# Patient Record
Sex: Male | Born: 1970
Health system: Southern US, Community
[De-identification: ages and names within clinical notes are randomized; demographics above are authoritative.]

## PROBLEM LIST (undated history)

## (undated) DIAGNOSIS — I513 Intracardiac thrombosis, not elsewhere classified: Secondary | ICD-10-CM

## (undated) DIAGNOSIS — Z9989 Dependence on other enabling machines and devices: Secondary | ICD-10-CM

## (undated) DIAGNOSIS — I4891 Unspecified atrial fibrillation: Secondary | ICD-10-CM

## (undated) DIAGNOSIS — I4892 Unspecified atrial flutter: Secondary | ICD-10-CM

## (undated) DIAGNOSIS — G4733 Obstructive sleep apnea (adult) (pediatric): Secondary | ICD-10-CM

## (undated) DIAGNOSIS — G4730 Sleep apnea, unspecified: Secondary | ICD-10-CM

## (undated) DIAGNOSIS — D689 Coagulation defect, unspecified: Secondary | ICD-10-CM

## (undated) DIAGNOSIS — F17200 Nicotine dependence, unspecified, uncomplicated: Secondary | ICD-10-CM

## (undated) DIAGNOSIS — J349 Unspecified disorder of nose and nasal sinuses: Secondary | ICD-10-CM

## (undated) DIAGNOSIS — I5020 Unspecified systolic (congestive) heart failure: Secondary | ICD-10-CM

## (undated) DIAGNOSIS — IMO0002 Reserved for concepts with insufficient information to code with codable children: Secondary | ICD-10-CM

## (undated) DIAGNOSIS — K529 Noninfective gastroenteritis and colitis, unspecified: Secondary | ICD-10-CM

## (undated) DIAGNOSIS — F419 Anxiety disorder, unspecified: Secondary | ICD-10-CM

## (undated) DIAGNOSIS — K5792 Diverticulitis of intestine, part unspecified, without perforation or abscess without bleeding: Secondary | ICD-10-CM

## (undated) DIAGNOSIS — I428 Other cardiomyopathies: Secondary | ICD-10-CM

## (undated) DIAGNOSIS — N189 Chronic kidney disease, unspecified: Secondary | ICD-10-CM

## (undated) DIAGNOSIS — E0590 Thyrotoxicosis, unspecified without thyrotoxic crisis or storm: Secondary | ICD-10-CM

## (undated) HISTORY — DX: Noninfective gastroenteritis and colitis, unspecified: K52.9

## (undated) HISTORY — PX: ATRIAL ABLATION SURGERY: SHX560

## (undated) HISTORY — DX: Anxiety disorder, unspecified: F41.9

## (undated) HISTORY — DX: Sleep apnea, unspecified: G47.30

## (undated) HISTORY — DX: Obstructive sleep apnea (adult) (pediatric): G47.33

## (undated) HISTORY — DX: Diverticulitis of intestine, part unspecified, without perforation or abscess without bleeding: K57.92

## (undated) HISTORY — PX: FINGER SURGERY: SHX640

## (undated) HISTORY — DX: Other cardiomyopathies: I42.8

## (undated) HISTORY — DX: Unspecified atrial fibrillation: I48.91

## (undated) HISTORY — DX: Reserved for concepts with insufficient information to code with codable children: IMO0002

## (undated) HISTORY — DX: Unspecified systolic (congestive) heart failure: I50.20

## (undated) HISTORY — PX: CHOLECYSTECTOMY: SHX55

## (undated) HISTORY — PX: VASECTOMY: SHX75

## (undated) HISTORY — DX: Intracardiac thrombosis, not elsewhere classified: I51.3

## (undated) HISTORY — DX: Thyrotoxicosis, unspecified without thyrotoxic crisis or storm: E05.90

## (undated) HISTORY — DX: Unspecified disorder of nose and nasal sinuses: J34.9

## (undated) HISTORY — DX: Obstructive sleep apnea (adult) (pediatric): Z99.89

## (undated) HISTORY — DX: Coagulation defect, unspecified: D68.9

## (undated) HISTORY — DX: Chronic kidney disease, unspecified: N18.9

## (undated) HISTORY — DX: Nicotine dependence, unspecified, uncomplicated: F17.200

---

## 2002-10-20 ENCOUNTER — Encounter (HOSPITAL_COMMUNITY): Admission: RE | Admit: 2002-10-20 | Discharge: 2002-11-19 | Payer: Self-pay | Admitting: Preventative Medicine

## 2005-01-06 ENCOUNTER — Ambulatory Visit: Payer: Self-pay | Admitting: *Deleted

## 2009-12-29 ENCOUNTER — Ambulatory Visit: Payer: Self-pay | Admitting: Cardiology

## 2011-06-10 DIAGNOSIS — I513 Intracardiac thrombosis, not elsewhere classified: Secondary | ICD-10-CM | POA: Insufficient documentation

## 2011-07-03 ENCOUNTER — Other Ambulatory Visit: Payer: Self-pay | Admitting: Family Medicine

## 2011-07-04 DIAGNOSIS — I4891 Unspecified atrial fibrillation: Secondary | ICD-10-CM

## 2011-07-05 ENCOUNTER — Encounter: Payer: Self-pay | Admitting: Cardiology

## 2011-07-05 NOTE — Progress Notes (Signed)
   I spoke with the patient and his wife at length today in the hospital before he went home. I stressed the importance of him trying to not overwork. I stressed the importance of the meds to control his heart rate. I also recommended to him that he abstain completely from any alcohol. I explained that we hope that with no alcohol and atrial fib rate control,  he will have return of good left ventricular function. I stressed the importance also of his anticoagulation. I told him our office would call to arrange for a visit very soon to assess his heart rate. Lynden Ang will review possible times for a visit with Dr. Andee Lineman Tomorrow morning.  When walking in the ICU this morning his heart rate still increased to 120 and 130. This was while receiving diltiazem and metoprolol. After further discussion with the patient and his wife I decided to send him home on amiodarone to be used on a short-term for rate control. He was started on 200 mg twice a day of amiodarone. In addition I started digoxin at 0.25 mg daily. It was my feeling that he would need all of these meds for reasonable rate control. His digoxin level will have to be followed while on amiodarone. Dr. Andee Lineman will decide if he wants to adjust these medicines further. I also called Dr. Reuel Boom to be sure that he was aware of all the plans.  Jerral Bonito, MD

## 2011-07-06 DIAGNOSIS — I059 Rheumatic mitral valve disease, unspecified: Secondary | ICD-10-CM

## 2011-07-25 ENCOUNTER — Telehealth: Payer: Self-pay | Admitting: Cardiology

## 2011-07-25 DIAGNOSIS — Z01810 Encounter for preprocedural cardiovascular examination: Secondary | ICD-10-CM

## 2011-07-25 DIAGNOSIS — Z79899 Other long term (current) drug therapy: Secondary | ICD-10-CM

## 2011-07-25 DIAGNOSIS — I4891 Unspecified atrial fibrillation: Secondary | ICD-10-CM

## 2011-07-25 NOTE — Telephone Encounter (Signed)
Received a telephone call from Mikle Bosworth. She is wanting to know if a TEE has been scheduled yet For her husband. She also states that he is running out of Lopressor. This needs to be called into [facility] [place]. [place].  Please call on her cell # [phone].

## 2011-07-30 ENCOUNTER — Encounter: Payer: Self-pay | Admitting: Cardiology

## 2011-07-30 DIAGNOSIS — I498 Other specified cardiac arrhythmias: Secondary | ICD-10-CM

## 2011-07-31 DIAGNOSIS — I4891 Unspecified atrial fibrillation: Secondary | ICD-10-CM

## 2011-08-02 ENCOUNTER — Encounter: Payer: Self-pay | Admitting: *Deleted

## 2011-08-02 ENCOUNTER — Other Ambulatory Visit: Payer: Self-pay | Admitting: *Deleted

## 2011-08-02 ENCOUNTER — Telehealth: Payer: Self-pay

## 2011-08-02 NOTE — Telephone Encounter (Signed)
Discussed below with wife.  TEE/DCCV scheduled for Thursday, June 6 at 11:00 am.  Patient will need to arrive at [facility] at 9:00 am for same day work.  Wife will pick up info sheet & folder today.

## 2011-08-02 NOTE — Telephone Encounter (Signed)
No precert required per Doyce Para @ [facility] [phone]

## 2011-08-02 NOTE — Telephone Encounter (Signed)
TEE/DCCV scheduled for tomorrow, June 6 at 11:00.  CHECKING PERCERT

## 2011-08-02 NOTE — Telephone Encounter (Signed)
Addended by: Murriel Hopper on: 08/02/2011 10:42 AM   Modules accepted: Orders

## 2011-08-03 DIAGNOSIS — I4891 Unspecified atrial fibrillation: Secondary | ICD-10-CM

## 2011-08-04 ENCOUNTER — Ambulatory Visit (INDEPENDENT_AMBULATORY_CARE_PROVIDER_SITE_OTHER): Payer: PRIVATE HEALTH INSURANCE | Admitting: *Deleted

## 2011-08-04 VITALS — BP 117/81 | HR 56 | Ht 72.0 in | Wt 208.0 lb

## 2011-08-04 DIAGNOSIS — Z8249 Family history of ischemic heart disease and other diseases of the circulatory system: Secondary | ICD-10-CM

## 2011-08-04 DIAGNOSIS — I4892 Unspecified atrial flutter: Secondary | ICD-10-CM

## 2011-08-04 DIAGNOSIS — R000 Tachycardia, unspecified: Secondary | ICD-10-CM

## 2011-08-04 DIAGNOSIS — G4733 Obstructive sleep apnea (adult) (pediatric): Secondary | ICD-10-CM

## 2011-08-04 DIAGNOSIS — I4891 Unspecified atrial fibrillation: Secondary | ICD-10-CM

## 2011-08-04 DIAGNOSIS — I43 Cardiomyopathy in diseases classified elsewhere: Secondary | ICD-10-CM

## 2011-08-04 NOTE — Progress Notes (Signed)
Patient presents to office for ekg and vitals post cardioversion. Patient has taken medications and no side effects noted. No c/o dizziness, chest pain or sob.

## 2011-08-07 NOTE — Telephone Encounter (Signed)
Taken care off - you should have gotten Radiation protection practitioner.

## 2011-08-17 ENCOUNTER — Telehealth: Payer: Self-pay | Admitting: Cardiology

## 2011-08-17 ENCOUNTER — Encounter: Payer: Self-pay | Admitting: *Deleted

## 2011-08-17 ENCOUNTER — Ambulatory Visit (INDEPENDENT_AMBULATORY_CARE_PROVIDER_SITE_OTHER): Payer: PRIVATE HEALTH INSURANCE | Admitting: *Deleted

## 2011-08-17 VITALS — BP 119/81 | HR 126 | Ht 72.0 in | Wt 206.0 lb

## 2011-08-17 DIAGNOSIS — I4891 Unspecified atrial fibrillation: Secondary | ICD-10-CM

## 2011-08-17 DIAGNOSIS — I4892 Unspecified atrial flutter: Secondary | ICD-10-CM

## 2011-08-17 MED ORDER — AMIODARONE HCL 200 MG PO TABS: 200.0000 mg | ORAL_TABLET | Freq: Two times a day (BID) | ORAL | Status: DC

## 2011-08-17 NOTE — Patient Instructions (Addendum)
Please come to our office on Monday morning 08/21/11 @8 :30am to check rhythm status b/4 planned DCCV.  Your physician has recommended that you have a Cardioversion (DCCV). Electrical Cardioversion uses a jolt of electricity to your heart either through paddles or wired patches attached to your chest. This is a controlled, usually prescheduled, procedure. Defibrillation is done under light anesthesia in the hospital, and you usually go home the day of the procedure. This is done to get your heart back into a normal rhythm. You are not awake for the procedure. Please see the instruction sheet given to you today. Scheduled for Monday June 24th 2013 at 2:15pm. Arrival time is 12:15 pm at [facility].  Your physician has recommended you make the following change in your medication: Increase amiodarone 200 mg to twice daily.  Referral to EP Clinic at [facility].

## 2011-08-17 NOTE — Telephone Encounter (Signed)
Cardioversion with Degent on Monday at 1:00pm AT [facility]

## 2011-08-18 ENCOUNTER — Telehealth: Payer: Self-pay | Admitting: *Deleted

## 2011-08-18 NOTE — Telephone Encounter (Signed)
Pt has [facility].  Per Rowan Blase, [phone], for both repeat TEE and cardioversion, [phone], Valid Only 08-21-11

## 2011-08-18 NOTE — Progress Notes (Signed)
Patient presented to office on yesterday per email dictation from [facility] requesting vitals and EKG.

## 2011-08-18 NOTE — Telephone Encounter (Signed)
Referral to EP Clinic at [facility]. Per Dr. Andee Lineman requesting that Jerry Richards be seen as soon as possible.

## 2011-08-21 ENCOUNTER — Ambulatory Visit (INDEPENDENT_AMBULATORY_CARE_PROVIDER_SITE_OTHER): Payer: PRIVATE HEALTH INSURANCE | Admitting: *Deleted

## 2011-08-21 ENCOUNTER — Encounter: Payer: Self-pay | Admitting: *Deleted

## 2011-08-21 VITALS — Ht 72.0 in | Wt 206.0 lb

## 2011-08-21 DIAGNOSIS — Z8249 Family history of ischemic heart disease and other diseases of the circulatory system: Secondary | ICD-10-CM | POA: Insufficient documentation

## 2011-08-21 DIAGNOSIS — I4891 Unspecified atrial fibrillation: Secondary | ICD-10-CM | POA: Insufficient documentation

## 2011-08-21 DIAGNOSIS — G4733 Obstructive sleep apnea (adult) (pediatric): Secondary | ICD-10-CM | POA: Insufficient documentation

## 2011-08-21 DIAGNOSIS — I43 Cardiomyopathy in diseases classified elsewhere: Secondary | ICD-10-CM | POA: Insufficient documentation

## 2011-08-21 DIAGNOSIS — I4892 Unspecified atrial flutter: Secondary | ICD-10-CM | POA: Insufficient documentation

## 2011-08-21 MED ORDER — AMIODARONE HCL 200 MG PO TABS: 200.0000 mg | ORAL_TABLET | Freq: Every day | ORAL | Status: DC

## 2011-08-21 NOTE — Assessment & Plan Note (Signed)
Patient initially at the current cardioversion but was found to have occasional tenderness thrombus and subsequently was anticoagulated for 4 weeks and brought back for repeat TEE to make sure that this clears on the left atrial appendix thrombus.during this time he was placed on Xarelto.  He was found to have no residual left atrial appendage thrombus.  He proceeded with cardioversion.  normal sinus rhythm was restored.

## 2011-08-21 NOTE — Assessment & Plan Note (Signed)
Patient has significant LV dysfunction noted during his first TEE.  He was placed on amiodarone for rate control and to make sure that he maintained normal sinus rhythm after his cardioversion.  During his second transesophageal echocardiogram was noted to have improvement in LV function to 40-45%.  Amiodarone was continued however in the interim.  Clinically the patient did well with NYHA class I symptoms.  He did not present with heart failure that brought her palpitations and "fluttering"

## 2011-08-21 NOTE — Progress Notes (Signed)
Patient presents to office for EKG pre DCCV. Patient has taken medications and no side effects noted. No c/o pain, dizziness or sob. DCCV will be cancelled per MD as patient back in sinus rhythm.

## 2011-08-21 NOTE — Addendum Note (Signed)
Addended by: Learta Codding on: 08/21/2011 06:01 AM   Modules accepted: Level of Service

## 2011-08-21 NOTE — Assessment & Plan Note (Signed)
Patient has now developed typical atrial flutter.  We will proceed with another cardioversion to restore normal sinus rhythm that he will need to be referred to the EP clinic for several reasons.  The patient cannot stay for a prolonged time on amiodarone.  We only started this to make sure that he would have recovery of his LV function.  If the patient needs another antiarrhythmic drug dronaderone may be another option as long as LV function is greater than 40% and his LV function is recovered there is a option  Of 1C antiarrhythmics. The patient also needs to discuss the possible indication for left and right sided radiofrequency or cryoablation for maintenance of normal sinus rhythm.

## 2011-08-21 NOTE — Patient Instructions (Signed)
   Left JV heart cath - will do in 3 weeks  Hold Xarelto 3 days prior to cath Follow up after cath

## 2011-08-21 NOTE — Progress Notes (Signed)
Peyton Bottoms, MD, [facility] Board Certified in Adult Cardiovascular Medicine,Internal Medicine and Critical Care Medicine    CC: recurrent atrial arrhythmia now with typical flutter  HPI:  The patient is a 41 year old male who was cardioverted earlier this year for atrial fibrillation.  The patient presented with palpitations and some mild shortness of breath. His initial ejection fraction on 07/04/2011 was 20-25%.  There were no significant valvular lesions.  His TSH was within normal limits.despite his significant LV dysfunction the patient still had very good exercise tolerance and actually felt a little limitation in his active lifestyle short of palpitations and " a strange sensation in his chest".also nasal presentation on 07/04/2011 was noted for chest tightness, shortness of breath associated with tachypalpitations as well as some abdominal discomfort.the plan was also to proceed with the lexiscan once he was back in normal sinus rhythm.  This has not been done however and the meanwhile. Clinically the patient also appears to have obstructive sleep apnea and has been scheduled for an outpatient evaluation with Dr. Andrey Campanile. The patient's initial TE guided cardioversion has to be delayed because of the presence of left atrial appendage thrombus.  He was placed for 4 weeks on Xarelto and then brought back for another TE guided cardioversion.  He had no residual left atrial appendage thrombus and we proceeded with cardioversion.  Normal sinus rhythm was restored and patient was continued on amiodarone.  Amiodarone was chosen because of his LV dysfunction and high pretest probability for coronary artery disease.  The patient has long-standing history of tobacco abuse. The patient did well initially after his cardioversion and maintained normal sinus rhythm for several weeks.  He now presents back however in typical atrial flutter, I written different from his original presentation.  This also despite  the fact that the patient is taking amiodarone.  The patient was seen as an add on the clinic today.  PMH: reviewed and listed in Problem List in Electronic Records (and see below) Anxiety disorder Tobacco use one and a half packs a day Sinus problems Recurrent headaches  Allergies/SH/FHX : available in Electronic Records for review  No Known Allergies History   Social History  . Marital Status: Married    Spouse Name: N/A    Number of Children: N/A  . Years of Education: N/A   Occupational History  . Not on file.   Social History Main Topics  . Smoking status: Current Everyday Smoker -- 1.0 packs/day for 20 years    Types: Cigarettes  . Smokeless tobacco: Never Used  . Alcohol Use: Not on file  . Drug Use: Not on file  . Sexually Active: Not on file   Other Topics Concern  . Not on file   Social History Narrative  . The patient is married and has 2 children he smokes one pack and a half a day for over the last 20 years.  He drinks alcohol occasionally.  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  She is Merchandiser, retail of the birthing center   Family history: Father is 12 and has history of atrial fibrillation with ablation.  Mother is 22 and healthy.  Both his grandparents reportedly coronary artery disease although it is not clear if this was a premature age.  He has a brother who has no history of CAD or atrial fibrillation.  Medications: Current Outpatient Prescriptions  Medication Sig Dispense Refill  . Rivaroxaban (XARELTO)  20 MG TABS Take 1 tablet by mouth daily.      Marland Kitchen amiodarone (PACERONE) 200 MG tablet Take 1 tablet (200 mg total) by mouth 2 (two) times daily.  60 tablet  3    ROS: No nausea or vomiting. No fever or chills.No melena or hematochezia.No bleeding.No claudication  Physical Exam: BP 117/81  Pulse 56  Ht 6' (1.829 m)  Wt 208 lb (94.348 kg)  BMI 28.21 kg/m2 General:Well-nourished white male somewhat  anxious but in no distress Neck:normal carotid upstroke and no carotid bruits.  No thyromegaly, no thyroid.  JVP 6 cm Lungs:clear breath sounds bilaterally without wheezing. Cardiac:regular rate and rhythm with normal S1-S2, tachycardic, no pathological murmurs Vascular:no edema.  Normal distal pulses Skin:warm and dry Physcologic:normal affect  12lead BJY:NWGN I atrial flutter Limited bedside ECHO:N/A No images are attached to the encounter.   Assessment and Plan  Atrial fibrillation Patient initially at the current cardioversion but was found to have occasional tenderness thrombus and subsequently was anticoagulated for 4 weeks and brought back for repeat TEE to make sure that this clears on the left atrial appendix thrombus.during this time he was placed on Xarelto.  He was found to have no residual left atrial appendage thrombus.  He proceeded with cardioversion.  normal sinus rhythm was restored.  Tachycardia induced cardiomyopathy Patient has significant LV dysfunction noted during his first TEE.  He was placed on amiodarone for rate control and to make sure that he maintained normal sinus rhythm after his cardioversion.  During his second transesophageal echocardiogram was noted to have improvement in LV function to 40-45%.  Amiodarone was continued however in the interim.  Clinically the patient did well with NYHA class I symptoms.  He did not present with heart failure that brought her palpitations and "fluttering"  Atrial flutter Patient has now developed typical atrial flutter.  We will proceed with another cardioversion to restore normal sinus rhythm that he will need to be referred to the EP clinic for several reasons.  The patient cannot stay for a prolonged time on amiodarone.  We only started this to make sure that he would have recovery of his LV function.  If the patient needs another antiarrhythmic drug dronaderone may be another option as long as LV function is greater than  40% and his LV function is recovered there is a option  Of 1C antiarrhythmics. The patient also needs to discuss the possible indication for left and right sided radiofrequency or cryoablation for maintenance of normal sinus rhythm.  Obstructive sleep apnea Patient has been referred for an evaluation with Dr. Andrey Campanile.High pretest probability after discussing his symptoms with his wife.  Rule out coronary artery disease  Patient will need a stress test once normal sinus rhythm and is within established.  He has had no chest pain after his last cardioversion and has had improvement of his LVEF nevertheless underlying ischemic heart disease needs to be ruled out given the patient's high pretest probability of coronary artery disease and this may also affect the future choice of antiarrhythmic versus proceeding with ablation.     Patient Active Problem List  Diagnosis  . Atrial fibrillation  . Tachycardia induced cardiomyopathy  . Atrial flutter

## 2011-08-23 NOTE — Telephone Encounter (Signed)
Per Dr Johney Frame add on to Dr Jenel Lucks schedule on 09/14/11 in [place]

## 2011-08-27 NOTE — Progress Notes (Signed)
Amiodarone was also decreased.

## 2011-08-28 HISTORY — PX: CARDIAC CATHETERIZATION: SHX172

## 2011-08-30 ENCOUNTER — Encounter: Payer: Self-pay | Admitting: *Deleted

## 2011-08-30 ENCOUNTER — Other Ambulatory Visit: Payer: Self-pay | Admitting: *Deleted

## 2011-08-30 ENCOUNTER — Telehealth: Payer: Self-pay

## 2011-08-30 DIAGNOSIS — I4891 Unspecified atrial fibrillation: Secondary | ICD-10-CM

## 2011-08-30 DIAGNOSIS — Z01810 Encounter for preprocedural cardiovascular examination: Secondary | ICD-10-CM

## 2011-08-30 DIAGNOSIS — R072 Precordial pain: Secondary | ICD-10-CM

## 2011-08-30 NOTE — Telephone Encounter (Signed)
Left JV cath scheduled for 7/16 - 7:30 - [place]  Checking percert

## 2011-08-30 NOTE — Telephone Encounter (Signed)
Having cath on Tuesday, July 16 at 7:30 with Dr. Riley Kill.  Should he keep Allred for 7/18 or move out a little bit later?

## 2011-09-04 NOTE — Telephone Encounter (Signed)
Auth # [id number] per Samul Dada Spoke with Gidget @ [facility] [phone] ext [id number]

## 2011-09-05 NOTE — Telephone Encounter (Signed)
Discussed below with wife [name] yesterday.  States she & Dr. Andee Lineman had discussed MD in [facility] that does ablations & questioned what she should do.  States that she prefers to take husband where he will have the best possible success rate.  Advised her that GD is out of office currently and I have no info regarding MD in [facility].  Advised her to keep OV with Allred and discuss options with him.  She verbalized understanding.

## 2011-09-08 ENCOUNTER — Encounter (HOSPITAL_COMMUNITY): Payer: Self-pay | Admitting: Pharmacy Technician

## 2011-09-08 ENCOUNTER — Encounter: Payer: Self-pay | Admitting: *Deleted

## 2011-09-08 NOTE — Progress Notes (Signed)
Patient ID: Jerry Richards, male   DOB: May 12, 1970, 41 y.o.   MRN: [id number]  Spoke with Rosalita Chessman in JV lab.  She has changed to main lab schedule & notified Trish that PA will be needed Tuesday morning early for this patient so that H& P can be done.  (note put on schedule per Rosalita Chessman).

## 2011-09-08 NOTE — Progress Notes (Signed)
Patient ID: Jerry Richards, male   DOB: [birth year], 41 y.o.   MRN: [id number]  Cath rescheduled to main lab.  Patient to arrive to short stay at 5:30 instead of 6:30 as previously discussed.  Patient will need to be seen by PA prior to cath as there is not an H & P in chart.  Wife [name] notified of above changes.

## 2011-09-11 ENCOUNTER — Ambulatory Visit: Payer: PRIVATE HEALTH INSURANCE | Admitting: Cardiology

## 2011-09-12 ENCOUNTER — Ambulatory Visit (HOSPITAL_COMMUNITY)
Admission: RE | Admit: 2011-09-12 | Discharge: 2011-09-12 | Disposition: A | Discharge status: Home / Self Care | Payer: PRIVATE HEALTH INSURANCE | Source: Ambulatory Visit | Attending: Cardiology | Admitting: Cardiology

## 2011-09-12 ENCOUNTER — Encounter (HOSPITAL_COMMUNITY)
Admission: RE | Disposition: A | Discharge status: Home / Self Care | Payer: Self-pay | Source: Ambulatory Visit | Attending: Cardiology

## 2011-09-12 ENCOUNTER — Encounter (HOSPITAL_COMMUNITY): Payer: Self-pay | Admitting: Cardiology

## 2011-09-12 ENCOUNTER — Encounter (HOSPITAL_BASED_OUTPATIENT_CLINIC_OR_DEPARTMENT_OTHER): Admission: RE | Payer: Self-pay | Source: Ambulatory Visit

## 2011-09-12 DIAGNOSIS — F411 Generalized anxiety disorder: Secondary | ICD-10-CM | POA: Insufficient documentation

## 2011-09-12 DIAGNOSIS — R079 Chest pain, unspecified: Secondary | ICD-10-CM | POA: Insufficient documentation

## 2011-09-12 DIAGNOSIS — I4891 Unspecified atrial fibrillation: Secondary | ICD-10-CM | POA: Diagnosis present

## 2011-09-12 DIAGNOSIS — I5020 Unspecified systolic (congestive) heart failure: Secondary | ICD-10-CM | POA: Insufficient documentation

## 2011-09-12 DIAGNOSIS — G4730 Sleep apnea, unspecified: Secondary | ICD-10-CM | POA: Insufficient documentation

## 2011-09-12 DIAGNOSIS — R0609 Other forms of dyspnea: Secondary | ICD-10-CM | POA: Insufficient documentation

## 2011-09-12 DIAGNOSIS — I428 Other cardiomyopathies: Secondary | ICD-10-CM | POA: Diagnosis present

## 2011-09-12 DIAGNOSIS — I4892 Unspecified atrial flutter: Secondary | ICD-10-CM | POA: Diagnosis present

## 2011-09-12 DIAGNOSIS — I43 Cardiomyopathy in diseases classified elsewhere: Secondary | ICD-10-CM | POA: Diagnosis present

## 2011-09-12 DIAGNOSIS — R0989 Other specified symptoms and signs involving the circulatory and respiratory systems: Secondary | ICD-10-CM | POA: Insufficient documentation

## 2011-09-12 DIAGNOSIS — F17200 Nicotine dependence, unspecified, uncomplicated: Secondary | ICD-10-CM | POA: Insufficient documentation

## 2011-09-12 HISTORY — PX: LEFT HEART CATHETERIZATION WITH CORONARY ANGIOGRAM: SHX5451

## 2011-09-12 LAB — PROTIME-INR: Prothrombin Time: 13.5 seconds (ref 11.6–15.2)

## 2011-09-12 SURGERY — JV LEFT HEART CATHETERIZATION WITH CORONARY ANGIOGRAM
Anesthesia: Moderate Sedation

## 2011-09-12 SURGERY — LEFT HEART CATHETERIZATION WITH CORONARY ANGIOGRAM
Anesthesia: LOCAL

## 2011-09-12 MED ORDER — MIDAZOLAM HCL 2 MG/2ML IJ SOLN
INTRAMUSCULAR | Status: AC
  Filled 2011-09-12: qty 2

## 2011-09-12 MED ORDER — SODIUM CHLORIDE 0.9 % IV SOLN: 250.0000 mL | INTRAVENOUS | Status: DC | PRN

## 2011-09-12 MED ORDER — SODIUM CHLORIDE 0.9 % IV SOLN: INTRAVENOUS | Status: DC

## 2011-09-12 MED ORDER — SODIUM CHLORIDE 0.9 % IJ SOLN: 3.0000 mL | Freq: Two times a day (BID) | INTRAMUSCULAR | Status: DC

## 2011-09-12 MED ORDER — ACETAMINOPHEN 325 MG PO TABS: 650.0000 mg | ORAL_TABLET | ORAL | Status: DC | PRN

## 2011-09-12 MED ORDER — SODIUM CHLORIDE 0.9 % IV SOLN
INTRAVENOUS | Status: DC
  Administered 2011-09-12: 07:00:00 via INTRAVENOUS

## 2011-09-12 MED ORDER — ASPIRIN 81 MG PO CHEW
324.0000 mg | CHEWABLE_TABLET | ORAL | Status: AC
  Administered 2011-09-12: 324 mg via ORAL
  Filled 2011-09-12: qty 4

## 2011-09-12 MED ORDER — NITROGLYCERIN 0.2 MG/ML ON CALL CATH LAB
INTRAVENOUS | Status: AC
  Filled 2011-09-12: qty 1

## 2011-09-12 MED ORDER — HEPARIN (PORCINE) IN NACL 2-0.9 UNIT/ML-% IJ SOLN
INTRAMUSCULAR | Status: AC
  Filled 2011-09-12: qty 2000

## 2011-09-12 MED ORDER — ONDANSETRON HCL 4 MG/2ML IJ SOLN: 4.0000 mg | Freq: Four times a day (QID) | INTRAMUSCULAR | Status: DC | PRN

## 2011-09-12 MED ORDER — FENTANYL CITRATE 0.05 MG/ML IJ SOLN
INTRAMUSCULAR | Status: AC
  Filled 2011-09-12: qty 2

## 2011-09-12 MED ORDER — LIDOCAINE HCL (PF) 1 % IJ SOLN
INTRAMUSCULAR | Status: AC
  Filled 2011-09-12: qty 30

## 2011-09-12 MED ORDER — SODIUM CHLORIDE 0.9 % IJ SOLN: 3.0000 mL | INTRAMUSCULAR | Status: DC | PRN

## 2011-09-12 NOTE — H&P (Signed)
History and Physical  Patient ID: Jerry Richards Patient ID: Jerry Richards MRN: [id number], DOB/AGE: 30-Jun-1970 41 y.o. Date of Encounter: 09/12/2011  Primary Physician: Donzetta Sprung, MD Primary Cardiologist: GD  Chief Complaint:  Chest pain, arrhythmia  HPI: 41 year old male with no hx CAD. Pt with hx afib/flutter. He spends several hours 3-4 times/week out of rhythm. The arrhythmia makes him feel hot/cold, nauseated and weak. He will have DOE with it. While out of rhythm, he cannot tolerate heat. Pt also gets occasional chest pain, pressure, 2/10, not exertional and not associated with the arrhythmia. He was seen by GD and decision was made to do heart cath, r/o CAD and consider ablation.   Past Medical History  Diagnosis Date  . Atrial fibrillation   . Systolic heart failure     EF 30-86%  . Sleep apnea   . Current smoker   . Anxiety   . Sinus trouble      Surgical History:  Past Surgical History  Procedure Date  . Cholecystectomy   . Finger surgery      I have reviewed the patient's current medications. Prior to Admission medications   Medication Sig Start Date  amiodarone (PACERONE) 200 MG tablet Take 200 mg by mouth daily. 08/21/11  ibuprofen (ADVIL) 200 MG tablet Take 400 mg by mouth every 8 (eight) hours as needed.   Rivaroxaban (XARELTO) 20 MG TABS Take 1 tablet by mouth daily.     Scheduled Meds:   . aspirin  324 mg Oral Pre-Cath  . sodium chloride  3 mL Intravenous Q12H   Continuous Infusions:   . sodium chloride 20 mL/hr at 09/12/11 0723   PRN Meds:.sodium chloride, sodium chloride  Allergies: No Known Allergies  History   Social History  . Marital Status: Married    Spouse Name: N/A    Number of Children: N/A  . Years of Education: N/A   Occupational History  . 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 Smoking status: Current Everyday Smoker -- 1.0 packs/day for 20 years    Types: Cigarettes  .  Smokeless tobacco: Never Used  . Alcohol Use: Yes     drinks socially  . Drug Use: Not on file  . Sexually Active: Not on file   Other Topics Concern  . Not on file   Social History Narrative   Has 2 children     Family History  Problem Relation Age of Onset  . Atrial fibrillation Father     age 39  . Coronary artery disease      grandparents ?  Marland Kitchen Healthy Mother     age 8    Review of Systems: Pt does not weight daily but has no history of edema/swelling. Pt has no recent illnesses, fevers or chills. He has no exertional chest pain or SOB, except when out of rhythm. Full 14-point review of systems otherwise negative except as noted above.   Physical Exam: Blood pressure 118/70, pulse 54, temperature 97.6 F (36.4 C), temperature source Oral, resp. rate 18, height 6' (1.829 m), weight 206 lb (93.441 kg), SpO2 96.00%. General: Well developed, well nourished, male in no acute distress. Head: Normocephalic, atraumatic, sclera non-icteric, no xanthomas, nares are without discharge. Dentition: good Neck: No carotid bruits. JVD not elevated. No thyromegally Lungs: Good expansion bilaterally. without wheezes or rhonchi. Very few Rales Heart: IRRegular rate and rhythm with S1 S2.  No S3 or S4.  No murmur, no rubs, or gallops appreciated. Abdomen: Soft, non-tender, non-distended with normoactive bowel sounds. No hepatomegaly. No rebound/guarding. No obvious abdominal masses. Msk:  Strength and tone appear normal for age. No joint deformities or effusions, no spine or costo-vertebral angle tenderness. Extremities: No clubbing or cyanosis. No edema.  Distal pedal pulses are 2+ in 4 extrem Neuro: Alert and oriented X 3. Moves all extremities spontaneously. No focal deficits noted. Psych:  Responds to questions appropriately with a normal affect. Skin: No rashes or lesions noted  Labs: pending    Radiology/Studies:  No results found.  ECG: pending   ASSESSMENT AND PLAN:  Active  Problems:  Atrial fibrillation  Tachycardia induced cardiomyopathy  Atrial flutter Chest pain   Pt here today for cardiac cath. Further evaluation and treatment depending on the results. Pt has not taken Xarelto since Friday. F/u with EP after cath to decide on ablation.   Signed,  Bjorn Loser Barrett PA-C 09/12/2011, 8:21 AM   Patient seen and examined. Agree with history as outlined by [name].  Has history of recurrent atrial rhythms, with sleep study planned.  EF is reduced.  Cath was recommend by Dr. Andee Lineman. Ablation has been considered.   Absolutely no bleeding history.  Had INR checked on Xarelto.  Labs otherwise stable.  He understands need for cardiac cath, and risks reviewed.  He is agreeable to proceed.

## 2011-09-12 NOTE — Interval H&P Note (Signed)
History and Physical Interval Note:  09/12/2011 11:36 AM  Jerry Richards  has presented today for surgery, with the diagnosis of r/o CAD  The various methods of treatment have been discussed with the patient and family. After consideration of risks, benefits and other options for treatment, the patient has consented to  Procedure(s) (LRB): LEFT HEART CATHETERIZATION WITH CORONARY ANGIOGRAM (N/A) as a surgical intervention .  The patient's history has been reviewed, patient examined, no change in status, stable for surgery.  I have reviewed the patients' chart and labs.  Questions were answered to the patient's satisfaction.     Shawnie Pons

## 2011-09-12 NOTE — CV Procedure (Signed)
   Cardiac Catheterization Procedure Note  Name: Jerry Richards MRN: [id number] DOB: 03-Jun-1970  Procedure: Left Heart Cath, Selective Coronary Angiography, LV angiography  Indication: atrial arrythmias with LV dysfunction and chest pain.     Procedural details: The right groin was prepped, draped, and anesthetized with 1% lidocaine. Using modified Seldinger technique, a 4 French sheath was introduced into the right femoral artery. Standard Judkins catheters were used for coronary angiography and left ventriculography. Catheter exchanges were performed over a guidewire.  We used a left bypass catheter to engage the non dominant right eventually due to anterior takeoff.  This vessel appeared normal (not on stored images).   There were no immediate procedural complications. The patient was transferred to the post catheterization recovery area for further monitoring.  Procedural Findings: Hemodynamics:  AO 108/68 (85) LV 109/8   Coronary angiography: Coronary dominance: left  Left mainstem: Large and no significant obstruction  Left anterior descending (LAD): Courses to the apex and larger caliber vessel, tapers distally.  (?due to nicotine).  The vessel wraps the apex.  No significant obstruction noted.   Left circumflex (LCx): The vessel is dominant.  There are several marginal branches, and PLA/PDA system.  No obstruction noted.  As with the LAD, there is a constricted appearance  (Pt smoked this am).    Right coronary artery (RCA): Non dominant vessel.  Cannulated with left bypass.  No obstruction noted.   Left ventriculography: Left ventricular systolic function is reduced overall.  EF would be estimated at 35-40% but could be higher.    Final Conclusions:   1.  Left dominant system without high grade obstruction 2.  Reduced overall LV function of uncertain etiology  Recommendations:  1.  Follow up in [place], with efforts geared toward management of atrial arrhythmias, and  monitoring of LV function.   2.  Hold Xarelto until end of week.    Shawnie Pons 09/12/2011, 12:28 PM

## 2011-09-12 NOTE — Progress Notes (Signed)
Stable post cath.  Went over data in detail with patient and wife.  I also called Gene Serpe and he is going to make arrangements in [place] to insure follow up and future plans with Dr. Margarita Mail absence.   LV overall is down.  MRI might have some merit to assess findings of non ischemic CM. He would like an alternative to Amiodarone for his atrial fib.   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 Groin looks great

## 2011-09-14 ENCOUNTER — Ambulatory Visit: Payer: PRIVATE HEALTH INSURANCE | Admitting: Internal Medicine

## 2011-09-15 ENCOUNTER — Ambulatory Visit (HOSPITAL_BASED_OUTPATIENT_CLINIC_OR_DEPARTMENT_OTHER): Admission: RE | Admit: 2011-09-15 | Payer: PRIVATE HEALTH INSURANCE | Source: Ambulatory Visit | Admitting: Cardiology

## 2011-09-20 ENCOUNTER — Ambulatory Visit (INDEPENDENT_AMBULATORY_CARE_PROVIDER_SITE_OTHER): Payer: PRIVATE HEALTH INSURANCE | Admitting: Physician Assistant

## 2011-09-20 ENCOUNTER — Encounter: Payer: Self-pay | Admitting: Physician Assistant

## 2011-09-20 ENCOUNTER — Other Ambulatory Visit: Payer: Self-pay | Admitting: *Deleted

## 2011-09-20 VITALS — BP 117/77 | HR 63 | Ht 72.0 in | Wt 211.8 lb

## 2011-09-20 DIAGNOSIS — I4891 Unspecified atrial fibrillation: Secondary | ICD-10-CM

## 2011-09-20 MED ORDER — METOPROLOL TARTRATE 25 MG PO TABS: ORAL_TABLET | ORAL | Status: DC

## 2011-09-20 MED ORDER — AMIODARONE HCL 200 MG PO TABS: 200.0000 mg | ORAL_TABLET | Freq: Two times a day (BID) | ORAL | Status: DC

## 2011-09-20 NOTE — Assessment & Plan Note (Signed)
Currently in NSR, on amiodarone. However, patient suggests occasional breakthrough episodes of possible PAF. Therefore, he has agreed to a 21 day event monitor. Additionally, will increase amiodarone to 200 twice a day and provide Lopressor 25 mg prn for sustained palpitations. We'll plan return visit with Dr. Andee Lineman in 3-4 weeks. Patient is to remain on Xarelto also, pending further recommendations.

## 2011-09-20 NOTE — Patient Instructions (Signed)
   Increase Amiodarone 200mg  twice a day    Lopressor 25mg  - as needed for increased heart rate for greater than 20 minutes  21 day E-Cardio monitor - will be sent to home Follow up in  3-4 weeks

## 2011-09-20 NOTE — Assessment & Plan Note (Signed)
No significant atherosclerosis by recent cardiac catheterization; EF 35-40%co. This compares  to  EF 40-45%, by TEE, in June, and a prior study in May, which yielded EF 25-30%, at time of LAA thrombus. Patient presents with no symptoms suggestive of CHF. Therefore, no current indication to add a diuretic. However, he was instructed to refrain from added salt in his diet, and to monitor his total fluid intake.

## 2011-09-20 NOTE — Progress Notes (Signed)
Primary Cardiologist: Jerry Bunting, MD   HPI: Patient presents for post elective cardiac catheterization followup, recently referred by Dr. Andee Richards.  This study, performed by Dr. Riley Richards, yielded no significant obstructive disease; EF 35-40%. Patient was instructed to hold Xarelto until the end of the week.   Of note, he is status post successful DCCV of atrial fibrillation, performed on June 6, by Dr. Andee Richards, with recommendation to continue Xarelto for a full 4 weeks. Also, he had been found to have a LA a thrombus, by 2-D echocardiography, in May. Estimated EF 25-30%, with mild MR, at that time. At time of followup TEE, EF was assessed as 40-45%, by Dr. Andee Richards.   EKG in office today, reviewed by me, indicates continued maintenance of NSR.   Clinically, patient denies any exertional CP. His salient complaint appears to be that of intermittent tachycardia palpitations, particularly pronounced and long-lasting this a.m. In fact, he was surprised to hear that he was in NSR today. He also suggests that his BP drops to the 80-90 systolic range, as confirmed by his wife, with concomitant pulse readings in the 120 bpm range. He suggests significant symptoms associated with these tachycardia palpitations.  Patient denies any complications of right groin incision site.  No Known Allergies  Current Outpatient Prescriptions  Medication Sig Dispense Refill  . amiodarone (PACERONE) 200 MG tablet Take 200 mg by mouth daily.      Marland Kitchen ibuprofen (ADVIL,MOTRIN) 200 MG tablet Take 400 mg by mouth every 8 (eight) hours as needed. For pain      . Rivaroxaban (XARELTO) 20 MG TABS Take 1 tablet by mouth daily.        Past Medical History  Diagnosis Date  . Atrial fibrillation   . Systolic heart failure     EF 16-10%  . Sleep apnea   . Current smoker   . Anxiety   . Sinus trouble     Past Surgical History  Procedure Date  . Cholecystectomy   . Finger surgery     History   Social History  . Marital  Status: Married    Spouse Name: N/A    Number of Children: N/A  . Years of Education: N/A   Occupational History  . 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 Smoking status: Current Everyday Smoker -- 1.0 packs/day for 20 years    Types: Cigarettes  . Smokeless tobacco: Never Used  . Alcohol Use: Yes     drinks socially  . Drug Use: Not on file  . Sexually Active: Not on file   Other Topics Concern  . Not on file   Social History Narrative   Has 2 children    Family History  Problem Relation Age of Onset  . Atrial fibrillation Father     age 70  . Coronary artery disease      grandparents ?  Marland Kitchen Healthy Mother     age 78    ROS: no nausea, vomiting; no fever, chills; no melena, hematochezia; no claudication. Denies PND, orthopnea, LE edema, or DOE.  PHYSICAL EXAM: BP 117/77  Pulse 63  Ht 6' (1.829 m)  Wt 211 lb 12.8 oz (96.072 kg)  BMI 28.73 kg/m2 GENERAL: 41 year-old male, sitting upright; NAD HEENT: NCAT, PERRLA, EOMI; sclera clear; no xanthelasma NECK: palpable bilateral carotid pulses, no bruits; no JVD; no TM LUNGS: CTA bilaterally CARDIAC: RRR (S1, S2); no significant murmurs; no rubs or  gallops ABDOMEN: soft, non-tender; intact BS EXTREMETIES: intact distal pulses; no significant peripheral edema SKIN: warm/dry; no obvious rash/lesions MUSCULOSKELETAL: no joint deformity NEURO: no focal deficit; NL affect   EKG: reviewed and available in Electronic Records   ASSESSMENT & PLAN:  Atrial fibrillation Currently in NSR, on amiodarone. However, patient suggests occasional breakthrough episodes of possible PAF. Therefore, he has agreed to a 21 day event monitor. Additionally, will increase amiodarone to 200 twice a day and provide Lopressor 25 mg prn for sustained palpitations. We'll plan return visit with Dr. Andee Richards in 3-4 weeks. Patient is to remain on Xarelto also, pending further recommendations.  Tachycardia  induced cardiomyopathy No significant atherosclerosis by recent cardiac catheterization; EF 35-40%co. This compares  to  EF 40-45%, by TEE, in June, and a prior study in May, which yielded EF 25-30%, at time of LAA thrombus. Patient presents with no symptoms suggestive of CHF. Therefore, no current indication to add a diuretic. However, he was instructed to refrain from added salt in his diet, and to monitor his total fluid intake.     Jerry Richards, PAC

## 2011-09-25 DIAGNOSIS — I4891 Unspecified atrial fibrillation: Secondary | ICD-10-CM

## 2011-09-26 ENCOUNTER — Telehealth: Payer: Self-pay | Admitting: Cardiology

## 2011-09-26 NOTE — Telephone Encounter (Signed)
Received a call from cardionet notifying me that Mr. Pabst was in atrial fibrillation with rates 130-160s. I called the patient and he noted that he felt well, but knew he was in a.fib. He did not feel his heart racing, but new it was "out of rhythm". He had not taken his PRN metoprolol, but had already taken his amiodarone and xarelto. I instructed him to take his metoprolol and call the office for further instructions. He stated understanding.  [place], PA-C 09/26/2011 8:03 AM

## 2011-09-26 NOTE — Telephone Encounter (Signed)
Strips reviewed by Dr. Diona Browner as Gene is out of office this afternoon & discussed below.  Should continue Amiodarone at current dose & give a little more time as has not been on this quite a week yet.  Can continue to use Lopressor as needed, but no more than 2 in 24 hour time frame.  Continue all medications as currently taking.    Wife [name] notified via voice mail.  Will go ahead & have PA review this note since initially sent to him.  Also, PA last saw this patient in office.  Will notify patient if PA advises anything further.

## 2011-10-11 ENCOUNTER — Ambulatory Visit (INDEPENDENT_AMBULATORY_CARE_PROVIDER_SITE_OTHER): Payer: PRIVATE HEALTH INSURANCE | Admitting: Cardiology

## 2011-10-11 VITALS — BP 130/70 | HR 58 | Ht 72.0 in | Wt 214.8 lb

## 2011-10-11 DIAGNOSIS — R000 Tachycardia, unspecified: Secondary | ICD-10-CM

## 2011-10-11 DIAGNOSIS — I43 Cardiomyopathy in diseases classified elsewhere: Secondary | ICD-10-CM

## 2011-10-11 DIAGNOSIS — I4891 Unspecified atrial fibrillation: Secondary | ICD-10-CM

## 2011-10-11 DIAGNOSIS — Z8249 Family history of ischemic heart disease and other diseases of the circulatory system: Secondary | ICD-10-CM

## 2011-10-11 DIAGNOSIS — I4892 Unspecified atrial flutter: Secondary | ICD-10-CM

## 2011-10-11 DIAGNOSIS — G4733 Obstructive sleep apnea (adult) (pediatric): Secondary | ICD-10-CM

## 2011-10-11 NOTE — Assessment & Plan Note (Signed)
History of typical atrial flutter currently no recurrence patient will need biatrial ablation

## 2011-10-11 NOTE — Assessment & Plan Note (Signed)
Patient remains in normal sinus rhythm on amiodarone. This is not a long-term option. The patient has opted for radiate catheter frequency ablation and will be referred to Dr. Ronn Melena at [facility]

## 2011-10-11 NOTE — Assessment & Plan Note (Signed)
We'll refer to Dr. Andrey Campanile in followup as needed

## 2011-10-11 NOTE — Patient Instructions (Addendum)
Your physician recommends that you continue on your current medications as directed. Please refer to the Current Medication list given to you today.   You are being referred to Referral to Dr. Ronn Melena MD cardiologist [phone], (msg left already for them to call our office). We will contact you once we hear from them.

## 2011-10-11 NOTE — Assessment & Plan Note (Signed)
Catheterization no evidence of coronary artery disease performed by Dr. Tedra Senegal

## 2011-10-11 NOTE — Progress Notes (Signed)
Jerry Bottoms, MD, [facility] ABIM Board Certified in Adult Cardiovascular Medicine,Internal Medicine and Critical Care Medicine    CC:     Follow patient with atrial flutter and atrial fibrillation                                                                             HPI:       Patient is a tachycardia induced cardiomyopathy but no coronary artery disease. Due to initial severe LV dysfunction he was placed on amiodarone. He is not taking amiodarone 400 mg a day. He has remained in normal sinus rhythm but his therapies not long-term option. At times he still feels palpitations at other times he feels his heart rate is slow and feels very weak. Overall there significant improvement in his symptoms but the patient his wife and made a decision that they will proceed with radiate catheter frequency ablation I have referred the patient for a second opinion to [place].  PMH: reviewed and listed in Problem List in Electronic Records (and see below) Past Medical History  Diagnosis Date  . Atrial fibrillation   . Systolic heart failure     EF 53-66%  . Sleep apnea   . Current smoker   . Anxiety   . Sinus trouble    Past Surgical History  Procedure Date  . Cholecystectomy   . Finger surgery     Allergies/SH/FHX : available in Electronic Records for review  No Known Allergies History   Social History  . Marital Status: Married    Spouse Name: N/A    Number of Children: N/A  . Years of Education: N/A   Occupational History  . 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 Smoking status: Current Everyday Smoker -- 1.0 packs/day for 20 years    Types: Cigarettes  . Smokeless tobacco: Never Used  . Alcohol Use: Yes     drinks socially  . Drug Use: Not on file  . Sexually Active: Not on file   Other Topics Concern  . Not on file   Social History Narrative   Has 2 children   Family History  Problem Relation Age of Onset  . Atrial  fibrillation Father     age 17  . Coronary artery disease      grandparents ?  Marland Kitchen Healthy Mother     age 33    Medications: Current Outpatient Prescriptions  Medication Sig Dispense Refill  . amiodarone (PACERONE) 200 MG tablet Take 1 tablet (200 mg total) by mouth 2 (two) times daily.  60 tablet  6  . ibuprofen (ADVIL,MOTRIN) 200 MG tablet Take 400 mg by mouth every 8 (eight) hours as needed. For pain      . metoprolol tartrate (LOPRESSOR) 25 MG tablet Take one tab as needed for elevated heart rate > 20 minutes.  30 tablet  3  . Rivaroxaban (XARELTO) 20 MG TABS Take 1 tablet by mouth daily.        ROS: No nausea or vomiting. No fever or chills.No melena or hematochezia.No bleeding.No claudication  Physical Exam: BP 130/70  Pulse 58  Ht 6' (  1.829 m)  Wt 214 lb 12.8 oz (97.433 kg)  BMI 29.13 kg/m2 General: Well-nourished white male in no distress Neck: Number upstroke no carotid bruits. No thyromegaly nonnodular thyroid. JVP 6-7 cm Lungs: Clear breath sounds bilaterally no wheezing Cardiac: Regular rate and rhythm with normal S1-S2 no murmur rubs or gallops Vascular: No edema. Normal distal pulses Skin: Warm and dry Physcologic: Normal affect  12lead ECG: Normal sinus rhythm Limited bedside ECHO:N/A No images are attached to the encounter.   I reviewed and summarized the old records. I reviewed ECG and prior blood work.  Assessment and Plan  Atrial fibrillation Patient remains in normal sinus rhythm on amiodarone. This is not a long-term option. The patient has opted for radiate catheter frequency ablation and will be referred to Dr. Ronn Melena at [facility]  Atrial flutter History of typical atrial flutter currently no recurrence patient will need biatrial ablation  FH: coronary artery disease Catheterization no evidence of coronary artery disease performed by Dr. Tedra Senegal  Obstructive sleep apnea We'll refer to Dr. Andrey Campanile in followup as needed  Tachycardia induced  cardiomyopathy Improvement in ejection fraction this can be reassessed at [facility]    Patient Active Problem List  Diagnosis  . Atrial fibrillation  . Tachycardia induced cardiomyopathy  . Atrial flutter  . Obstructive sleep apnea  . FH: coronary artery disease

## 2011-10-11 NOTE — Assessment & Plan Note (Signed)
Improvement in ejection fraction this can be reassessed at [facility]

## 2011-10-17 ENCOUNTER — Telehealth: Payer: Self-pay | Admitting: *Deleted

## 2011-10-17 NOTE — Telephone Encounter (Signed)
Wife [name] calling this morning - states husband back out of rhythm.  Around 5 am - HR elevated in the 160's.  He did take the Metoprolol & his morning dose of Amiodarone.  Now HR is down some (110 around 8:15 ), but stating he just does not feel good, nausea.  Last seen 8/14.

## 2011-10-17 NOTE — Telephone Encounter (Signed)
Below discussed with Dr. Andee Lineman.  Per MD - patient can take another Metoprolol now.  In 4-5 hours, if HR still greater than 100 - can take another Metoprolol.  Also, check vitals & if okay, may want to change to Toprol XL 25mg  daily to start in the morning.  Wife Jerry Richards) states she will go home this afternoon to check on him & call back to let me know.  Confirmed that he is taking the Amiodarone 200mg  twice a day.  She also stated that they do have an appointment (Dr. Ronn Melena) for consult on Thursday, 8/29.  But, could not do ablation till November.  Jerry Richards states would be great if GD could call to help get this moved up if at all possible, does not feel like he can wait 3 months.

## 2011-10-17 NOTE — Telephone Encounter (Signed)
Received call back from wife.  States husband is back in rhythm.  Checked vitals around 2:00 pm & BP - 129/69 with HR - 56.  States she will wait to see how he does before starting daily dose of Toprol XL since she has the Metoprolol tart to use as needed.  States they also do not have any follow up scheduled with you.

## 2011-10-21 NOTE — Telephone Encounter (Signed)
Schedule f/u in 2 weeks. Still trying to get him seen sooner at [facility]

## 2011-10-25 NOTE — Telephone Encounter (Signed)
Wife [name] notified of below.  OV scheduled for 9/12 at 8:45 with GD.

## 2011-10-26 DIAGNOSIS — I4819 Other persistent atrial fibrillation: Secondary | ICD-10-CM | POA: Insufficient documentation

## 2011-10-26 DIAGNOSIS — I428 Other cardiomyopathies: Secondary | ICD-10-CM | POA: Insufficient documentation

## 2011-10-26 DIAGNOSIS — G4730 Sleep apnea, unspecified: Secondary | ICD-10-CM | POA: Insufficient documentation

## 2011-11-08 ENCOUNTER — Telehealth: Payer: Self-pay | Admitting: Cardiology

## 2011-11-08 NOTE — Telephone Encounter (Signed)
Spoke with Jerry Richards today and she states that she and her husband will follow Dr. Andee Lineman.

## 2011-11-09 ENCOUNTER — Ambulatory Visit: Payer: PRIVATE HEALTH INSURANCE | Admitting: Cardiology

## 2011-11-12 DIAGNOSIS — I4891 Unspecified atrial fibrillation: Secondary | ICD-10-CM

## 2011-11-13 ENCOUNTER — Encounter: Payer: Self-pay | Admitting: Cardiovascular Disease

## 2011-11-13 ENCOUNTER — Other Ambulatory Visit (INDEPENDENT_AMBULATORY_CARE_PROVIDER_SITE_OTHER): Payer: PRIVATE HEALTH INSURANCE

## 2011-11-13 ENCOUNTER — Ambulatory Visit (INDEPENDENT_AMBULATORY_CARE_PROVIDER_SITE_OTHER): Payer: PRIVATE HEALTH INSURANCE | Admitting: Cardiovascular Disease

## 2011-11-13 VITALS — BP 90/70 | HR 68 | Ht 72.0 in | Wt 212.5 lb

## 2011-11-13 DIAGNOSIS — I959 Hypotension, unspecified: Secondary | ICD-10-CM

## 2011-11-13 DIAGNOSIS — I4891 Unspecified atrial fibrillation: Secondary | ICD-10-CM

## 2011-11-13 DIAGNOSIS — R000 Tachycardia, unspecified: Secondary | ICD-10-CM

## 2011-11-13 DIAGNOSIS — I43 Cardiomyopathy in diseases classified elsewhere: Secondary | ICD-10-CM

## 2011-11-13 NOTE — Assessment & Plan Note (Signed)
He is currently in sinus rhythm with frequent PACs. He had recent ablation done last week. I doubt that his nausea is due to medications given that he has been on amiodarone and Xarelto for a few months. It appears that his nausea is only happening when he is feeling palpitations. He feels that his heart is going into atrial fibrillation. Thus, I will request a 48-hour Holter monitor especially that his symptoms are happening on a daily basis.  Continue current dose of amiodarone 200 mg twice daily.

## 2011-11-13 NOTE — Assessment & Plan Note (Addendum)
I am concerned about his episodes of hypotension since his ablation. I think we have to rule out mechanical complications or pericardial effusions. Thus, I will obtain an echocardiogram.  I scheduled him for an echocardiogram today which was done and I reviewed the images. His left ventricle is still mildly dilated with mildly reduced LV systolic function with an estimated ejection fraction of 40-45%. There is a very small posterior pericardial effusion extending to around both atria. There is no signs of tamponade. No evidence of mechanical complications.

## 2011-11-13 NOTE — Patient Instructions (Addendum)
Your physician has requested that you have an echocardiogram. Echocardiography is a painless test that uses sound waves to create images of your heart. It provides your doctor with information about the size and shape of your heart and how well your heart's chambers and valves are working. This procedure takes approximately one hour. There are no restrictions for this procedure.  Your physician has recommended that you wear a holter monitor. Holter monitors are medical devices that record the heart's electrical activity. Doctors most often use these monitors to diagnose arrhythmias. Arrhythmias are problems with the speed or rhythm of the heartbeat. The monitor is a small, portable device. You can wear one while you do your normal daily activities. This is usually used to diagnose what is causing palpitations/syncope (passing out).  Thank you for enrolling in MyChart. Please follow the instructions below to securely access your online medical record. MyChart allows you to send messages to your doctor, view your test results, renew your prescriptions, schedule appointments, and more.  How Do I Sign Up? 1. In your Internet browser, go to [email or web address] 2. Click on the New  User? link in the Sign In box.  3. Enter your MyChart Access Code exactly as it appears below. You will not need to use this code after you have completed the sign-up process. If you do not sign up before the expiration date, you must request a new code. MyChart Access Code: [email or web address] Expires: 12/13/2011 10:39 AM  4. Enter the last four digits of your Social Security Number (xxxx) and Date of Birth (mm/dd/yyyy) as indicated and click Next. You will be taken to the next sign-up page. 5. Create a MyChart ID. This will be your MyChart login ID and cannot be changed, so think of one that is secure and easy to remember. 6. Create a MyChart password. You can change your password at any time. 7. Enter your  Password Reset Question and Answer and click Next. This can be used at a later time if you forget your password.  8. Select your communication preference, and if applicable enter your e-mail address. You will receive e-mail notification when new information is available in MyChart by choosing to receive e-mail notifications and filling in your e-mail. 9. Click Sign In. You can now view your medical record.   Additional Information If you have questions, you can email [email or web address] or call [phone] to talk to our MyChart staff. Remember, MyChart is NOT to be used for urgent needs. For medical emergencies, dial 911.

## 2011-11-13 NOTE — Progress Notes (Signed)
HPI  This is a pleasant 41 year old gentleman who is here today on an urgent basis. I was asked by Dr. Andee Lineman to see him regarding atrial fibrillation and recent ablation. The patient has a history of  tachycardia induced cardiomyopathy but no coronary artery disease. Due to initial severe LV dysfunction he was placed on amiodarone and has remained in normal sinus rhythm while on this therapy. His initial ejection fraction was around 20% but gradually improved. During cardiac catheterization his ejection fraction was around 40%. He underwent catheter ablation last week in [place]. It was a lengthy procedure and lasted 8 hours. He was observed overnight and supposedly had an echocardiogram done which showed no complications. Since that time, the patient has been experiencing palpitations associated with nausea. His nausea is only happening when he is having these palpitations. He does have some Zofran at home. He also has been experiencing episodes of hypotension. He felt bad last night and went to the emergency room at [facility]. His labs were overall unremarkable except for a troponin of 0.36. Creatinine was 1.2. CBC was within normal limits. He had CT scan of the chest which showed no acute cardiopulmonary process. There was no evidence of esophageal leak or fistula. His ECG showed sinus rhythm frequent PACs. No Known Allergies   Current Outpatient Prescriptions on File Prior to Visit  Medication Sig Dispense Refill  . amiodarone (PACERONE) 200 MG tablet Take 1 tablet (200 mg total) by mouth 2 (two) times daily.  60 tablet  6  . ibuprofen (ADVIL,MOTRIN) 200 MG tablet Take 400 mg by mouth every 8 (eight) hours as needed. For pain      . metoprolol tartrate (LOPRESSOR) 25 MG tablet Take one tab as needed for elevated heart rate > 20 minutes.  30 tablet  3  . Rivaroxaban (XARELTO) 20 MG TABS Take 1 tablet by mouth daily.         Past Medical History  Diagnosis Date  . Systolic  heart failure     EF 20-25%  . Sleep apnea   . Current smoker   . Anxiety   . Sinus trouble   . Atrial fibrillation     a-fib     Past Surgical History  Procedure Date  . Cholecystectomy   . Finger surgery   . Cardiac catheterization 2013    [facility]  . Vasectomy   . Atrial ablation surgery      Family History  Problem Relation Age of Onset  . Atrial fibrillation Father     age 30  . Coronary artery disease      grandparents ?  Marland Kitchen Healthy Mother     age 67     History   Social History  . Marital Status: Married    Spouse Name: N/A    Number of Children: N/A  . Years of Education: N/A   Occupational History  . 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 Smoking status: Current Every Day Smoker -- 7.0 packs/day for 20 years    Types: Cigarettes  . Smokeless tobacco: Never Used  . Alcohol Use: Yes     drinks socially  . Drug Use: No  . Sexually Active: Not on file   Other Topics Concern  . Not on file   Social History Narrative   Has 2 children    PHYSICAL EXAM   BP 90/70  Pulse 68  Ht  6' (1.829 m)  Wt 212 lb 8 oz (96.389 kg)  BMI 28.82 kg/m2  Constitutional: He is oriented to person, place, and time. He appears well-developed and well-nourished. No distress.  HENT: No nasal discharge.  Head: Normocephalic and atraumatic.  Eyes: Pupils are equal and round. Right eye exhibits no discharge. Left eye exhibits no discharge.  Neck: Normal range of motion. Neck supple. No JVD present. No thyromegaly present.  Cardiovascular: Normal rate, regular rhythm with premature beats, normal heart sounds and. Exam reveals no gallop and no friction rub. No murmur heard.  Pulmonary/Chest: Effort normal and breath sounds normal. No stridor. No respiratory distress. He has no wheezes. He has no rales. He exhibits no tenderness.  Abdominal: Soft. Bowel sounds are normal. He exhibits no distension. There is no tenderness. There is no  rebound and no guarding.  Musculoskeletal: Normal range of motion. He exhibits no edema and no tenderness.  Neurological: He is alert and oriented to person, place, and time. Coordination normal.  Skin: Skin is warm and dry. No rash noted. He is not diaphoretic. No erythema. No pallor.  Psychiatric: He has a normal mood and affect. His behavior is normal. Judgment and thought content normal.     Sinus  Rhythm  - frequent PAC s  # PACs = 4. -Nonspecific ST depression   +   Nonspecific T-abnormality  -Nondiagnostic.   ABNORMAL  EKG:    ASSESSMENT AND PLAN

## 2011-11-14 ENCOUNTER — Institutional Professional Consult (permissible substitution): Payer: PRIVATE HEALTH INSURANCE | Admitting: Cardiovascular Disease

## 2011-11-15 ENCOUNTER — Other Ambulatory Visit: Payer: Self-pay | Admitting: *Deleted

## 2011-11-15 ENCOUNTER — Encounter: Payer: Self-pay | Admitting: Cardiovascular Disease

## 2011-11-15 ENCOUNTER — Ambulatory Visit (INDEPENDENT_AMBULATORY_CARE_PROVIDER_SITE_OTHER): Payer: PRIVATE HEALTH INSURANCE | Admitting: *Deleted

## 2011-11-15 DIAGNOSIS — I4891 Unspecified atrial fibrillation: Secondary | ICD-10-CM

## 2011-11-15 DIAGNOSIS — I959 Hypotension, unspecified: Secondary | ICD-10-CM

## 2011-11-15 DIAGNOSIS — I4949 Other premature depolarization: Secondary | ICD-10-CM

## 2011-11-15 NOTE — Progress Notes (Signed)
Patient presents to office today to have 48 hour holter placed. Patient verbalized understanding of instructions and will bring monitor back on 11/17/11.

## 2011-11-30 ENCOUNTER — Encounter: Payer: Self-pay | Admitting: Cardiovascular Disease

## 2012-02-09 DIAGNOSIS — I43 Cardiomyopathy in diseases classified elsewhere: Secondary | ICD-10-CM | POA: Insufficient documentation

## 2012-02-09 DIAGNOSIS — I4891 Unspecified atrial fibrillation: Secondary | ICD-10-CM | POA: Insufficient documentation

## 2012-02-09 DIAGNOSIS — Z7901 Long term (current) use of anticoagulants: Secondary | ICD-10-CM | POA: Insufficient documentation

## 2012-02-09 DIAGNOSIS — Z8679 Personal history of other diseases of the circulatory system: Secondary | ICD-10-CM | POA: Insufficient documentation

## 2012-02-09 DIAGNOSIS — Z0389 Encounter for observation for other suspected diseases and conditions ruled out: Secondary | ICD-10-CM | POA: Insufficient documentation

## 2012-04-13 ENCOUNTER — Other Ambulatory Visit: Payer: Self-pay

## 2012-07-01 ENCOUNTER — Encounter: Payer: Self-pay | Admitting: Cardiology

## 2012-07-01 DIAGNOSIS — I4891 Unspecified atrial fibrillation: Secondary | ICD-10-CM

## 2012-07-01 NOTE — Progress Notes (Signed)
   The patient has been seen by Dr. Andee Lineman over the past several months. He was seen last in our office in September, 2013. He had an atrial fibrillation done in [place] in September with a touchup in February also in [place]. Patient came in the emergency room today and I was called because they thought he was in atrial fibrillation. There is a full consult note dictated in the morning of hospital chart. The initial EKG showed sinus rhythm with many PACs. The patient had received magnesium 1 g IV in preparation for possible ibutilide therapy. I chose to proceed with cardioversion if it was indicated. However when I arrived the patient clearly had sinus rhythm. Also the initial EKG had not shown atrial fibrillation. I encouraged the emergency room staff to start him on beta blocker. It is not clear when I finished my consult who he would followup with. I offered our services. He mentions how much he likes to work with Dr. Andee Lineman. I will contact the patient and his wife Archie Patten( who works at [employer]) to encouraged him to followup information with Dr. Delena Serve in [place]. He should continue on the beta blocker.  If it is decided that he is to be followed in our office, it would be optimal if he could be followed by the electrophysiology team.  Jerral Bonito, MD

## 2012-07-09 ENCOUNTER — Encounter: Payer: Self-pay | Admitting: Cardiovascular Disease

## 2012-07-09 ENCOUNTER — Other Ambulatory Visit: Payer: Self-pay | Admitting: *Deleted

## 2012-07-09 ENCOUNTER — Telehealth: Payer: Self-pay | Admitting: *Deleted

## 2012-07-09 ENCOUNTER — Ambulatory Visit (INDEPENDENT_AMBULATORY_CARE_PROVIDER_SITE_OTHER): Payer: PRIVATE HEALTH INSURANCE | Admitting: Cardiovascular Disease

## 2012-07-09 ENCOUNTER — Encounter (INDEPENDENT_AMBULATORY_CARE_PROVIDER_SITE_OTHER): Payer: PRIVATE HEALTH INSURANCE

## 2012-07-09 VITALS — BP 116/81 | HR 82 | Ht 72.0 in | Wt 214.6 lb

## 2012-07-09 DIAGNOSIS — R002 Palpitations: Secondary | ICD-10-CM | POA: Insufficient documentation

## 2012-07-09 DIAGNOSIS — I4891 Unspecified atrial fibrillation: Secondary | ICD-10-CM

## 2012-07-09 DIAGNOSIS — R000 Tachycardia, unspecified: Secondary | ICD-10-CM

## 2012-07-09 DIAGNOSIS — I43 Cardiomyopathy in diseases classified elsewhere: Secondary | ICD-10-CM

## 2012-07-09 NOTE — Assessment & Plan Note (Signed)
Status post ablation done twice in [place]. Currently on Multaq. He is on anticoagulation with Xarelto.  Referred to Dr. Johney Frame as outlined above.

## 2012-07-09 NOTE — Telephone Encounter (Signed)
24 hr holter monitor placed on Pt 07/09/12 TK 

## 2012-07-09 NOTE — Progress Notes (Signed)
HPI  This is a pleasant Jerry Richards who is here today to reestablish cardiovascular care. He used to see Dr. Andee Lineman . The patient has a history of  tachycardia induced cardiomyopathy but no coronary artery disease. This was diagnosed in 2013. Due to initial severe LV dysfunction he was placed on amiodarone and has remained in normal sinus rhythm while on this therapy. His initial ejection fraction was around 20% but gradually improved. Cardiac catheterization in 07/13 showed no obstructive CAD and ejection fraction of 40%. He underwent A-fib catheter ablation in 09/13 in [place]. It was a lengthy procedure and lasted 8 hours.  He had recurrent atrial arrhythmia after the procedure and was placed back on amiodarone. He underwent a repeat ablation procedure in February in [place] and was placed on Multaq instead of amiodarone after the procedure. He had significant improvement in his symptoms after that up until recently. 2 weeks ago, he noticed significant exertional palpitations with a heart rate of 160 beats per minute. This happened after he worked in the yard. Her symptoms improved with rest. Since then, he has noticed significant palpitations and tachycardia mainly with physical activities and not at rest. He went to the emergency room recently at [facility] and was seen by Dr. Myrtis Ser. He was noted to be in sinus tachycardia with frequent PACs. He denies any chest pain or dyspnea. He is currently on Toprol 50 mg daily and Multaq twice daily. He continues to take Xarelto.   No Known Allergies   Current Outpatient Prescriptions on File Prior to Visit  Medication Sig Dispense Refill  . ibuprofen (ADVIL,MOTRIN) 200 MG tablet Take 400 mg by mouth every 8 (eight) hours as needed. For pain      . Rivaroxaban (XARELTO) 20 MG TABS Take 1 tablet by mouth daily.       No current facility-administered medications on file prior to visit.     Past Medical History  Diagnosis  Date  . Systolic heart failure     EF 16-10%  . Sleep apnea   . Current smoker   . Anxiety   . Sinus trouble   . Atrial fibrillation     a-fib     Past Surgical History  Procedure Laterality Date  . Cholecystectomy    . Finger surgery    . Cardiac catheterization  2013    [facility]  . Vasectomy    . Atrial ablation surgery       Family History  Problem Relation Age of Onset  . Atrial fibrillation Father     age 16  . Coronary artery disease      grandparents ?  Marland Kitchen Healthy Mother     age 5     History   Social History  . Marital Status: Married    Spouse Name: N/A    Number of Children: N/A  . Years of Education: N/A   Occupational History  . 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 Smoking status: Current Every Day Smoker -- 7.00 packs/day for 20 years    Types: Cigarettes  . Smokeless tobacco: Never Used  . Alcohol Use: Yes     Comment: drinks socially  . Drug Use: No  . Sexually Active: Not on file   Other Topics Concern  . Not on file   Social History Narrative   Has 2 children    PHYSICAL EXAM   BP  116/81  Pulse 82  Ht 6' (1.829 m)  Wt 214 lb 9.6 oz (97.342 kg)  BMI 29.1 kg/m2  Constitutional: He is oriented to person, place, and time. He appears well-developed and well-nourished. No distress.  HENT: No nasal discharge.  Head: Normocephalic and atraumatic.  Eyes: Pupils are equal and round. Right eye exhibits no discharge. Left eye exhibits no discharge.  Neck: Normal range of motion. Neck supple. No JVD present. No thyromegaly present.  Cardiovascular: Normal rate, regular rhythm with premature beats, normal heart sounds and. Exam reveals no gallop and no friction rub. No murmur heard.  Pulmonary/Chest: Effort normal and breath sounds normal. No stridor. No respiratory distress. He has no wheezes. He has no rales. He exhibits no tenderness.  Abdominal: Soft. Bowel sounds are normal. He exhibits no  distension. There is no tenderness. There is no rebound and no guarding.  Musculoskeletal: Normal range of motion. He exhibits no edema and no tenderness.  Neurological: He is alert and oriented to person, place, and time. Coordination normal.  Skin: Skin is warm and dry. No rash noted. He is not diaphoretic. No erythema. No pallor.  Psychiatric: He has a normal mood and affect. His behavior is normal. Judgment and thought content normal.    EKG: Normal sinus rhythm with normal PR interval. Mildly prolonged QT interval with QTC of 497 ms   ASSESSMENT AND PLAN

## 2012-07-09 NOTE — Assessment & Plan Note (Signed)
He is currently having recurrent palpitations which are mainly exertional and not at rest. He was noted recently to have sinus tachycardia with frequent PACs. It is possible that he might be having recurrent atrial arrhythmia. Given that his symptoms are mainly exertional, I will proceed with a treadmill stress test to see if there is evidence of exercise induced arrhythmia. I will also obtain a 48-hour Holter monitor given that his symptoms are almost happening on a daily basis now. Given his complex history of atrial arrhythmia, I will refer him to Dr. Johney Frame.

## 2012-07-09 NOTE — Patient Instructions (Addendum)
Your physician has requested that you have an exercise tolerance test. For further information please visit [email or web address] Please also follow instruction sheet, as given.  Your physician has recommended that you wear a 24 hour holter monitor. Holter monitors are medical devices that record the heart's electrical activity. Doctors most often use these monitors to diagnose arrhythmias. Arrhythmias are problems with the speed or rhythm of the heartbeat. The monitor is a small, portable device. You can wear one while you do your normal daily activities. This is usually used to diagnose what is causing palpitations/syncope (passing out). PATIENT IS TO FOLLOW-UP with DR. ALLRED after both testings are done.  Your physician recommends that you schedule a follow-up appointment with Dr. Johney Frame for Consult after both testings are done  Your physician recommends that you continue on your current medications as directed. Please refer to the Current Medication list given to you today.

## 2012-07-09 NOTE — Assessment & Plan Note (Signed)
Most recent ejection fraction was normal by echocardiogram. Previous cardiac catheterization showed no evidence of obstructive coronary artery disease.

## 2012-07-12 ENCOUNTER — Telehealth: Payer: Self-pay | Admitting: Cardiovascular Disease

## 2012-07-12 NOTE — Telephone Encounter (Signed)
Monitor results reviewed with Dr. Johney Frame. Shows periods of atrial tachycardia. No atrial fibrillation. Per Dr. Johney Frame no changes needed at this time and pt should continue current medications. Schedule new pt appt with Dr. Johney Frame. Left message for pt to call back

## 2012-07-12 NOTE — Telephone Encounter (Signed)
They are very concerned to hear what monitor results are because pt is still having the issue at night and they are very concerned and want a call back today

## 2012-07-15 ENCOUNTER — Telehealth: Payer: Self-pay

## 2012-07-15 NOTE — Telephone Encounter (Signed)
Duplicate note

## 2012-07-15 NOTE — Telephone Encounter (Signed)
Jerry Richards at 07/15/2012 12:01 PM   Status: Signed            Pt wife called and would like holter results , Please advise

## 2012-07-15 NOTE — Telephone Encounter (Signed)
Will have monitor scanned in.  Dr Johney Frame signed and was sent back to scan in.  Will need Dr Kirke Corin to call patient if he still wants him to see Dr Johney Frame.  We offered him a time but he declined at present.

## 2012-07-15 NOTE — Telephone Encounter (Signed)
Wife informed of results, per Dr. Johney Frame.  She asks specifics re: how long he is in atrial tachycardia, etc. I explained I am in [place] office and Holter is in [place] office (not scanned in chart) I will forward to nurses in [place] to have them review and call her back with details/answers to her questions I offered to go ahead and make f/u with Dr. Johney Frame, but she declines at this time and will call us back should the patient decide differently

## 2012-07-15 NOTE — Telephone Encounter (Signed)
Pt wife called and would like holter results , Please advise

## 2012-07-16 NOTE — Telephone Encounter (Signed)
FYI

## 2012-08-01 ENCOUNTER — Ambulatory Visit (INDEPENDENT_AMBULATORY_CARE_PROVIDER_SITE_OTHER): Payer: PRIVATE HEALTH INSURANCE | Admitting: Physician Assistant

## 2012-08-01 DIAGNOSIS — I43 Cardiomyopathy in diseases classified elsewhere: Secondary | ICD-10-CM

## 2012-08-01 DIAGNOSIS — R002 Palpitations: Secondary | ICD-10-CM

## 2012-08-01 DIAGNOSIS — R0602 Shortness of breath: Secondary | ICD-10-CM

## 2012-08-01 DIAGNOSIS — I4891 Unspecified atrial fibrillation: Secondary | ICD-10-CM

## 2012-08-01 NOTE — Progress Notes (Signed)
Exercise Treadmill Test  Pre-Exercise Testing Evaluation Rhythm: normal sinus  Rate: 69     Test  Exercise Tolerance Test Ordering MD: Hillis Range, MD  Interpreting MD: Tereso Newcomer, PA-C  Unique Test No: 1  Treadmill:  1  Indication for ETT: exertional dyspnea  Contraindication to ETT: No   Stress Modality: exercise - treadmill  Cardiac Imaging Performed: non   Protocol: standard Bruce - maximal  Max BP:  178/74  Max MPHR (bpm):  176 85% MPR (bpm):  150  MPHR obtained (bpm):  122 % MPHR obtained:  69  Reached 85% MPHR (min:sec):  n/a Total Exercise Time (min-sec):  10:01  Workload in METS:  11.7 Borg Scale: 19  Reason ETT Terminated:  patient's desire to stop    ST Segment Analysis At Rest: non-specific ST segment slurring With Exercise: non-specific ST changes  Other Information Arrhythmia:  No Angina during ETT:  absent (0) Quality of ETT:  non-diagnostic  ETT Interpretation:  No significant ST depression at submaximal exercise.  No exercise induced arrhythmias.  Comments: Good exercise tolerance. No chest pain. Normal BP response to exercise. No significant ST-T changes at submaximal exercise to suggest ischemia.  Occasional PAC. Rare PVC.  Recommendations: F/u with Dr. Lorine Bears and Dr. Hillis Range as planned. Continue current Rx. Signed,  Tereso Newcomer, PA-C   08/01/2012 4:50 PM

## 2012-08-02 NOTE — Progress Notes (Signed)
Pt informed of normal stress test.

## 2012-08-20 ENCOUNTER — Encounter: Payer: Self-pay | Admitting: Cardiovascular Disease

## 2012-08-20 ENCOUNTER — Telehealth: Payer: Self-pay | Admitting: Internal Medicine

## 2012-08-20 NOTE — Telephone Encounter (Signed)
07-15-12 lm w/pt's wife to offer 07-17-12 @ 1130am, I called back to confirm, wife said pt can't come, will call back to rs, has appt for ETT 08-01-12 /MT 08-20-12 sent  letter to remind pt to set up appt, per staff message pt needs to see allred for afib per ov note from arida/mt

## 2012-08-27 DIAGNOSIS — R000 Tachycardia, unspecified: Secondary | ICD-10-CM

## 2012-08-28 ENCOUNTER — Encounter (HOSPITAL_COMMUNITY): Payer: Self-pay | Admitting: *Deleted

## 2012-08-28 ENCOUNTER — Inpatient Hospital Stay (HOSPITAL_COMMUNITY)
Admission: EM | Admit: 2012-08-28 | Discharge: 2012-08-28 | DRG: 309 | Disposition: A | Discharge status: Home / Self Care | Payer: PRIVATE HEALTH INSURANCE | Source: Other Acute Inpatient Hospital | Attending: Cardiology | Admitting: Cardiology

## 2012-08-28 DIAGNOSIS — I5020 Unspecified systolic (congestive) heart failure: Secondary | ICD-10-CM | POA: Diagnosis present

## 2012-08-28 DIAGNOSIS — I4892 Unspecified atrial flutter: Secondary | ICD-10-CM | POA: Diagnosis present

## 2012-08-28 DIAGNOSIS — Z9089 Acquired absence of other organs: Secondary | ICD-10-CM

## 2012-08-28 DIAGNOSIS — I4891 Unspecified atrial fibrillation: Principal | ICD-10-CM | POA: Diagnosis present

## 2012-08-28 DIAGNOSIS — F411 Generalized anxiety disorder: Secondary | ICD-10-CM | POA: Diagnosis present

## 2012-08-28 DIAGNOSIS — F17200 Nicotine dependence, unspecified, uncomplicated: Secondary | ICD-10-CM | POA: Diagnosis present

## 2012-08-28 DIAGNOSIS — I43 Cardiomyopathy in diseases classified elsewhere: Secondary | ICD-10-CM | POA: Diagnosis present

## 2012-08-28 DIAGNOSIS — R000 Tachycardia, unspecified: Secondary | ICD-10-CM

## 2012-08-28 DIAGNOSIS — I509 Heart failure, unspecified: Secondary | ICD-10-CM | POA: Diagnosis present

## 2012-08-28 DIAGNOSIS — Z9852 Vasectomy status: Secondary | ICD-10-CM

## 2012-08-28 DIAGNOSIS — G4730 Sleep apnea, unspecified: Secondary | ICD-10-CM | POA: Diagnosis present

## 2012-08-28 HISTORY — DX: Unspecified atrial flutter: I48.92

## 2012-08-28 LAB — MRSA PCR SCREENING: MRSA by PCR: POSITIVE — AB

## 2012-08-28 LAB — CBC WITH DIFFERENTIAL/PLATELET
Basophils Absolute: 0 10*3/uL (ref 0.0–0.1)
Basophils Relative: 0 % (ref 0–1)
HCT: 40.1 % (ref 39.0–52.0)
Hemoglobin: 14.3 g/dL (ref 13.0–17.0)
Lymphocytes Relative: 20 % (ref 12–46)
MCHC: 35.7 g/dL (ref 30.0–36.0)
Neutro Abs: 4.5 10*3/uL (ref 1.7–7.7)
Neutrophils Relative %: 67 % (ref 43–77)
RDW: 12.6 % (ref 11.5–15.5)
WBC: 6.6 10*3/uL (ref 4.0–10.5)

## 2012-08-28 LAB — BASIC METABOLIC PANEL
Chloride: 106 mEq/L (ref 96–112)
GFR calc Af Amer: >90 mL/min (ref >90)
GFR calc non Af Amer: >90 mL/min (ref >90)
Potassium: 3.8 mEq/L (ref 3.5–5.1)
Sodium: 138 mEq/L (ref 135–145)

## 2012-08-28 LAB — PROTIME-INR: INR: 1.67 — ABNORMAL HIGH (ref 0.00–1.49)

## 2012-08-28 LAB — APTT: aPTT: 37 seconds (ref 24–37)

## 2012-08-28 LAB — MAGNESIUM: Magnesium: 2.1 mg/dL (ref 1.5–2.5)

## 2012-08-28 MED ORDER — METOPROLOL SUCCINATE ER 100 MG PO TB24
100.0000 mg | ORAL_TABLET | Freq: Every day | ORAL | Status: DC
  Administered 2012-08-28: 100 mg via ORAL
  Filled 2012-08-28: qty 1

## 2012-08-28 MED ORDER — CHLORHEXIDINE GLUCONATE CLOTH 2 % EX PADS: 6.0000 | MEDICATED_PAD | Freq: Every day | CUTANEOUS | Status: DC

## 2012-08-28 MED ORDER — RIVAROXABAN 20 MG PO TABS
20.0000 mg | ORAL_TABLET | Freq: Every day | ORAL | Status: DC
  Filled 2012-08-28: qty 1

## 2012-08-28 MED ORDER — DILTIAZEM HCL ER COATED BEADS 120 MG PO CP24: 120.0000 mg | ORAL_CAPSULE | Freq: Every day | ORAL | Status: DC

## 2012-08-28 MED ORDER — DILTIAZEM HCL 60 MG PO TABS
60.0000 mg | ORAL_TABLET | Freq: Four times a day (QID) | ORAL | Status: DC
  Administered 2012-08-28 (×2): 60 mg via ORAL
  Filled 2012-08-28 (×5): qty 1

## 2012-08-28 MED ORDER — ONDANSETRON HCL 4 MG/2ML IJ SOLN: 4.0000 mg | Freq: Four times a day (QID) | INTRAMUSCULAR | Status: DC | PRN

## 2012-08-28 MED ORDER — MUPIROCIN 2 % EX OINT
1.0000 "application " | TOPICAL_OINTMENT | Freq: Two times a day (BID) | CUTANEOUS | Status: DC
  Administered 2012-08-28: 1 via NASAL
  Filled 2012-08-28: qty 22

## 2012-08-28 MED ORDER — DILTIAZEM HCL 100 MG IV SOLR
5.0000 mg/h | INTRAVENOUS | Status: DC
  Administered 2012-08-28: 5 mg/h via INTRAVENOUS
  Filled 2012-08-28: qty 100

## 2012-08-28 MED ORDER — ACETAMINOPHEN 325 MG PO TABS: 650.0000 mg | ORAL_TABLET | ORAL | Status: DC | PRN

## 2012-08-28 MED ORDER — DRONEDARONE HCL 400 MG PO TABS
400.0000 mg | ORAL_TABLET | Freq: Two times a day (BID) | ORAL | Status: DC
  Filled 2012-08-28 (×3): qty 1

## 2012-08-28 MED ORDER — METOPROLOL SUCCINATE ER 100 MG PO TB24: 100.0000 mg | ORAL_TABLET | Freq: Every day | ORAL | Status: DC

## 2012-08-28 MED ORDER — RIVAROXABAN 20 MG PO TABS
20.0000 mg | ORAL_TABLET | Freq: Every day | ORAL | Status: AC
  Administered 2012-08-28: 20 mg via ORAL
  Filled 2012-08-28: qty 1

## 2012-08-28 NOTE — Discharge Summary (Signed)
Patient ID: Jerry Richards,  MRN: [id number], DOB/AGE: April 14, 1970 42 y.o.  Admit date: 08/28/2012 Discharge date: 08/28/2012  Primary Care Provider: Donzetta Sprung Primary Cardiologist: Jerry Petit. Kirke Corin, Jerry Richards   Discharge Diagnoses Principal Problem:   Atrial flutter Active Problems:   Atrial fibrillation  Allergies No Known Allergies  Procedures  None  History of Present Illness  Jerry Richards is a 42 year old male with a history of tachycardia induced cardiomyopathy (EF 20-25%), atrial fibrillation (s/p PVI in [place] in 10/2011 and then again in 03/2012 by Jerry Richards at [facility]).    His atrial fibrillation was first identified in early 2013.  He was also found at that time to have a tachycardia induced cardiomyopathy.  He was placed on Amiodarone and with restoration of sinus rhythm, his EF improved to 40%.  He was referred to Jerry Richards at [facility] and underwent PVI in September of 2013 (per pt 12 hour procedure).  He did well following that procedure for a couple of months and then had recurrence of atrial fibrillation.  His Amiodarone was discontinued in January of 2014 with repeat ablation in February of this year, also by Jerry Richards.  He was then started on Multaq following his second ablation because of recurrence of atrial fibrillation.  His episodes of atrial fibrillation have been increasing in frequency and duration recently.  He is symptomatic with palpitations and increased fatigue. He presented to the [facility] ED on 7/1 secondary to recurrent palpitations and was found to be in atrial flutter with RVR.  He was placed on IV diltiazem and admitted for further evaluation.   Hospital Course  Following admission, pt was seen by Jerry Richards with electrophysiology.  After careful review of the case, it was felt that patient would benefit most from discontinuation of multaq, titration of beta blocker, and addition or oral diltiazem.  Once multaq wash out was complete, he would be considered for tikosyn loading  dependent upon his QTc.  Patient converted to sinus rhythm this afternoon and will be discharged home today.  He will f/u with Jerry Richards on Monday 7/7 to discuss future management further.  Discharge Vitals Blood pressure 100/65, pulse 62, temperature 98.2 F (36.8 C), temperature source Oral, resp. rate 14, height 6' (1.829 m), weight 211 lb 3.2 oz (95.8 kg), SpO2 97.00%.  Filed Weights   08/28/12 0430  Weight: 211 lb 3.2 oz (95.8 kg)   Labs  CBC  Recent Labs  08/28/12 0852  WBC 6.6  NEUTROABS 4.5  HGB 14.3  HCT 40.1  MCV 87.7  PLT 167   Basic Metabolic Panel  Recent Labs  08/28/12 0852  NA 138  K 3.8  CL 106  CO2 23  GLUCOSE 147*  BUN 13  CREATININE 0.91  CALCIUM 8.6  MG 2.1   Disposition  Pt is being discharged home today in good condition.  Follow-up Plans & Appointments  Follow-up Information   Follow up with Jerry Range, Jerry Richards On 09/02/2012. (9:30 AM)    Contact information:   [address] [place] [address] [phone]      Discharge Medications    Medication List    STOP taking these medications       dronedarone 400 MG tablet  Commonly known as:  MULTAQ      TAKE these medications       diltiazem 120 MG 24 hr capsule  Commonly known as:  CARDIZEM CD  Take 1 capsule (120 mg total) by mouth  daily.     ibuprofen 200 MG tablet  Commonly known as:  ADVIL,MOTRIN  Take 400 mg by mouth every 8 (eight) hours as needed. For pain     metoprolol succinate 100 MG 24 hr tablet  Commonly known as:  TOPROL-XL  Take 1 tablet (100 mg total) by mouth daily.     XARELTO 20 MG Tabs  Generic drug:  Rivaroxaban  Take 1 tablet by mouth daily.       Outstanding Labs/Studies  None  Duration of Discharge Encounter   Greater than 30 minutes including physician time.  Signed, Jerry Ducking NP 08/28/2012, 3:22 PM    Jerry Range, Jerry Richards

## 2012-08-28 NOTE — Progress Notes (Signed)
HR down to SR 67. Called for EKG.

## 2012-08-28 NOTE — Progress Notes (Signed)
D?C home accompanied by his wife

## 2012-08-28 NOTE — Progress Notes (Signed)
Utilization Review Completed. 08/28/2012  

## 2012-08-28 NOTE — Plan of Care (Signed)
Problem: Phase I Progression Outcomes Goal: Hemodynamically stable Outcome: Completed/Met Date Met:  08/28/12 Hemodynamically stable at this time

## 2012-08-28 NOTE — Plan of Care (Signed)
Problem: Phase I Progression Outcomes Goal: Ventricular heart rate < 120/min Outcome: Completed/Met Date Met:  08/28/12 Converted to sr rate of 69

## 2012-08-28 NOTE — Plan of Care (Signed)
Problem: Phase I Progression Outcomes Goal: Initial discharge plan identified Outcome: Completed/Met Date Met:  08/28/12 For discharge home

## 2012-08-28 NOTE — Consult Note (Signed)
ELECTROPHYSIOLOGY CONSULT NOTE    Patient ID: Jerry Richards MRN: [id number], DOB/AGE: [birth year] 42 y.o.  Admit date: 08/28/2012 Date of Consult: 08-28-2012  Primary Physician: Donzetta Sprung, MD Primary Cardiologist: Lorine Bears, MD  Reason for Consultation: atrial arrhythmias  HPI:  Jerry Richards is a 42 year old male with a history of tachycardia induced cardiomyopathy (EF 20-25%), atrial fibrillation (s/p PVI in [place] in 10/2011 and then again in 03/2012 by Dr Delena Serve at [facility]).    His atrial fibrillation was first identified in early 2013.  He was also found at that time to have a tachycardia induced cardiomyopathy.  He was placed on Amiodarone and with restoration of sinus rhythm, his EF improved to 40%.  He was referred to Dr Delena Serve at [facility] and underwent PVI in September of 2013 (per pt 12 hour procedure).  He did well following that procedure for a couple of months and then had recurrence of atrial fibrillation.  His Amiodarone was discontinued in January of 2014 with repeat ablation in February of this year, also by Dr Delena Serve.  He was then started on Multaq following his second ablation because of recurrence of atrial fibrillation.  His episodes of atrial fibrillation have been increasing in frequency and duration recently.  He is symptomatic with palpitations and increased fatigue.   Lab work and EKG are pending this admission.   He denies chest pain, shortness of breath, dizziness, syncope, or pre-syncope.   EP has been asked to evaluate for treatment options.   ROS is negative except as outlined above.   Past Medical History  Diagnosis Date  . Systolic heart failure     EF 78-29%  . Sleep apnea   . Current smoker   . Anxiety   . Sinus trouble   . Atrial fibrillation     a-fib     Surgical History:  Past Surgical History  Procedure Laterality Date  . Cholecystectomy    . Finger surgery    . Cardiac catheterization  2013    [facility]  . Vasectomy    . Atrial ablation  surgery       Prescriptions prior to admission  Medication Sig Dispense Refill  . dronedarone (MULTAQ) 400 MG tablet Take 400 mg by mouth 2 (two) times daily with a meal.      . ibuprofen (ADVIL,MOTRIN) 200 MG tablet Take 400 mg by mouth every 8 (eight) hours as needed. For pain      . metoprolol succinate (TOPROL-XL) 50 MG 24 hr tablet Take 50 mg by mouth daily. Take with or immediately following a meal.      . metoprolol tartrate (LOPRESSOR) 25 MG tablet Take 50 mg by mouth daily. Take one tab as needed for elevated heart rate > 20 minutes.      . Rivaroxaban (XARELTO) 20 MG TABS Take 1 tablet by mouth daily.        Inpatient Medications:  . dronedarone  400 mg Oral BID WC  . Rivaroxaban  20 mg Oral QAC supper    Allergies: No Known Allergies  History   Social History  . Marital Status: Married    Spouse Name: N/A    Number of Children: N/A  . Years of Education: N/A   Occupational History  . 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 Smoking status: Current Every Day Smoker -- 7.00 packs/day for 20 years    Types: Cigarettes  .  Smokeless tobacco: Never Used  . Alcohol Use: Yes     Comment: drinks socially  . Drug Use: No  . Sexually Active: Yes   Other Topics Concern  . Not on file   Social History Narrative   Has 2 children     Family History  Problem Relation Age of Onset  . Atrial fibrillation Father     age 75  . Coronary artery disease      grandparents ?  Marland Kitchen Healthy Mother     age 29    Labs: Pending   [id number]  TELEMETRY: atrial flutter/atrial tach, rates 130  Physical Exam: Filed Vitals:   08/28/12 0430 08/28/12 0615  BP: 113/78 93/63  Pulse: 135 67  Temp: 98.2 F (36.8 C)   TempSrc: Oral   Resp: 22 14  Height: 6' (1.829 m)   Weight: 211 lb 3.2 oz (95.8 kg)   SpO2: 98% 98%    GEN- The patient is well appearing, alert and oriented x 3 today.   Head- normocephalic, atraumatic Eyes-  Sclera  clear, conjunctiva pink Ears- hearing intact Oropharynx- clear Neck- supple, no JVP Lymph- no cervical lymphadenopathy Lungs- Clear to ausculation bilaterally, normal work of breathing Heart- irregular rate and rhythm, no murmurs, rubs or gallops, PMI not laterally displaced GI- soft, NT, ND, + BS Extremities- no clubbing, cyanosis, or edema MS- no significant deformity or atrophy Skin- no rash or lesion Psych- euthymic mood, full affect Neuro- strength and sensation are intact  Assessment and Plan:  1. Persistent afib/ atypical atrial flutter with RVR The patient has refractory afib despite two ablation by Dr Delena Serve.  He is presently in an atypical atrial flutter with RVR.  Multaq is ineffective.  Our initial approach is to rate control while multaq washes out.  I will continue xarelto for stroke prevention.  Increase metoprolol and convert diltiazem to oral. Depending on his QT interval after multaq washes out, I think that tikosyn may be his best option.  I would like to avoid amiodarone long term if possible. I would advise against a third PVI at this point but would favor consideration of a more aggressive procedure such as a hybrid convergent procedure.  2. Nonischemic cardiomyopathy Should not use multaq in patients with systolic dysfunction Stop multaq Rate control and possibly rhythm control will be necessary for treatment of his CHF long term.

## 2012-08-28 NOTE — H&P (Signed)
History and Physical  Patient ID: Jerry Richards MRN: [id number], SOB: [birth year] 42 y.o. Date of Encounter: 08/28/2012, 5:04 AM  Primary Physician: Donzetta Sprung, MD Primary Cardiologist: Dr. Kirke Corin  Chief Complaint: palpitations  HPI: 42 y.o. male w/ PMHx significant for atrial fibrillation s/p ablation x 2, tachycardia induced cardiomyopathy with recovery of his EF who presented to [facility] hospital with palpitations. He reports that these palpitations started around 8 pm and have persisted since. He has had an increase in palpitations including an ER visit on 5/5 where the arrhythmia terminated just prior to planned chemical cardioversion. Followed up with Dr. Kirke Corin and the patient underwent holter monitoring which had >5 minutes of atrial tach/flutter. Also underwent ETT and lasted 10:00 minutes without ischemia or arrhythmia at submaximal exercise.  In the last 3 weeks, he also followed up with his original electrophysiology physician in [place]. First ablation was 10/2011 with repeat in 03/2012. Per patient, EP told him recurrence was possible as he was still in the 6 month settling period of for the ablation.  In the [facility] ED, he was given diltiazem 10 mg IV x 2 without persistent success. Started on dilt gtt and transferred to [facility].   Currently still in the arrhythmia at 120 with occasional breaks. Feels palpitations. No chest pain. No PND, no orthopnea, no LE swelling.  EKG reveals atrial tachy vs. Atypical flutter with a ventricular rate of 120. Occasional breaks with a few beats of sinus seen on telemetry with quick reversion to tachycardia.  Outside labs: Alb 3.8 Alt 23 Ast 18 bnp 68 Ca 8.8 cpk-mb 1.1 hct 43 inr 1 plt 181 Trop 0.01 Mg 2.2 k 3.7   Past Medical History  Diagnosis Date  . Systolic heart failure     EF 40-98%  . Sleep apnea   . Current smoker   . Anxiety   . Sinus trouble   . Atrial fibrillation     a-fib   echo 10/2011: EF 50% afib  ablation x 2  Surgical History:  Past Surgical History  Procedure Laterality Date  . Cholecystectomy    . Finger surgery    . Cardiac catheterization  2013    [facility]  . Vasectomy    . Atrial ablation surgery       Home Meds: Prior to Admission medications   Medication Sig Start Date End Date Taking? Authorizing Provider  dronedarone (MULTAQ) 400 MG tablet Take 400 mg by mouth 2 (two) times daily with a meal.    Historical Provider, MD  ibuprofen (ADVIL,MOTRIN) 200 MG tablet Take 400 mg by mouth every 8 (eight) hours as needed. For pain    Historical Provider, MD  metoprolol succinate (TOPROL-XL) 50 MG 24 hr tablet Take 50 mg by mouth daily. Take with or immediately following a meal.    Historical Provider, MD  metoprolol tartrate (LOPRESSOR) 25 MG tablet Take 50 mg by mouth daily. Take one tab as needed for elevated heart rate > 20 minutes. 09/20/11   Prescott Parma, PA-C  Rivaroxaban (XARELTO) 20 MG TABS Take 1 tablet by mouth daily.    Historical Provider, MD    Allergies: No Known Allergies  History   Social History  . Marital Status: Married    Spouse Name: N/A    Number of Children: N/A  . Years of Education: N/A   Occupational History  . 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 Smoking  status: Current Every Day Smoker -- 7.00 packs/day for 20 years    Types: Cigarettes  . Smokeless tobacco: Never Used  . Alcohol Use: Yes     Comment: drinks socially  . Drug Use: No  . Sexually Active: Not on file   Other Topics Concern  . Not on file   Social History Narrative   Has 2 children     Family History  Problem Relation Age of Onset  . Atrial fibrillation Father     age 16  . Coronary artery disease      grandparents ?  Marland Kitchen Healthy Mother     age 77    Review of Systems: General: negative for chills, fever, night sweats or weight changes.  Cardiovascular: see HPI Dermatological: negative for rash Respiratory: negative  for cough or wheezing Urologic: negative for hematuria Abdominal: negative for nausea, vomiting, diarrhea, bright red blood per rectum, melena, or hematemesis Neurologic: negative for visual changes, syncope, or dizziness All other systems reviewed and are otherwise negative except as noted above.  Labs:   No results found for this basename: WBC, HGB, HCT, MCV, PLT   No results found for this basename: NA, K, CL, CO2, BUN, CREATININE, CALCIUM, LABALBU, PROT, BILITOT, ALKPHOS, ALT, AST, GLUCOSE,  in the last 168 hours No results found for this basename: CKTOTAL, CKMB, TROPONINI,  in the last 72 hours No results found for this basename: CHOL, HDL, LDLCALC, TRIG   No results found for this basename: DDIMER    Radiology/Studies:  No results found.   EKG: EKG reveals atrial tachy vs. Atypical flutter with a ventricular rate of 120. Occasional breaks with a few beats of sinus seen on telemetry with quick reversion to tachycardia.  Physical Exam: Blood pressure 113/78, pulse 135, temperature 98.2 F (36.8 C), temperature source Oral, resp. rate 22, height 6' (1.829 m), weight 95.8 kg (211 lb 3.2 oz), SpO2 98.00%. General: Well developed, well nourished, in no acute distress. Head: Normocephalic, atraumatic, sclera non-icteric, nares are without discharge Neck: Supple. Negative for carotid bruits. JVD not elevated. Lungs: Clear bilaterally to auscultation without wheezes, rales, or rhonchi. Breathing is unlabored. Heart: tachycardiac but regular, no murmurs Abdomen: Soft, non-tender, non-distended with normoactive bowel sounds. No rebound/guarding. No obvious abdominal masses. Msk:  Strength and tone appear normal for age. Extremities: No edema. No clubbing or cyanosis. Distal pedal pulses are 2+ and equal bilaterally. Neuro: Alert and oriented X 3. Moves all extremities spontaneously. Psych:  Responds to questions appropriately with a normal affect.   Problem List 1. Tachycardia: atrial  tach vs. Atypical flutter 2. H/o tachycardia induced cardiomyopathy, resolved by recent recho 3. Tobacco abuse 4. Anti-coagulated  ASSESSMENT AND PLAN:  42 y.o. male w/ PMHx significant for atrial fibrillation s/p ablation x 2, tachycardia induced cardiomyopathy with recovery of his EF who presented to [facility] hospital with palpitations --> found to be in an atrial tachycardia vs. Atypical flutter by 12 lead though it is difficult to say with his altered anatomy due to ablation and altered conductivity due to the dronederone.  At this time, will attempt to control rate with a diltiazem gtt and continue his home medication of dronedarone and rivaroxaban. Ultimately he needs input from EP specialists to help assist. At this time, cardioversion unlikely to work as clearly on telemetry he breaks from the arrhythmia ("converts") only to quickly fall back into the arrhythmia.  Smoking cessation advised given the health risks of continued tobacco.  Signed, Aoi Kouns C. MD  08/28/2012, 5:04 AM

## 2012-08-28 NOTE — Progress Notes (Signed)
Informed Ward Givens PA that pt wants to go home. Continues to be in SR at this time rate of  68. No other c/o. Wife present at bedside.

## 2012-08-29 ENCOUNTER — Telehealth: Payer: Self-pay | Admitting: Internal Medicine

## 2012-08-29 ENCOUNTER — Encounter: Payer: Self-pay | Admitting: Internal Medicine

## 2012-08-29 NOTE — Telephone Encounter (Signed)
New Problem:    Patient's wife called in wanting to know about postponing stopping the patient's Multaq.  Please call back.

## 2012-08-29 NOTE — Telephone Encounter (Signed)
Spoke with patient's wife.  She is going to keep him off his Multaq and keep his appointment as scheduled

## 2012-09-02 ENCOUNTER — Encounter: Payer: Self-pay | Admitting: Internal Medicine

## 2012-09-02 ENCOUNTER — Ambulatory Visit (INDEPENDENT_AMBULATORY_CARE_PROVIDER_SITE_OTHER): Payer: PRIVATE HEALTH INSURANCE | Admitting: Internal Medicine

## 2012-09-02 VITALS — BP 112/74 | HR 71 | Ht 72.0 in | Wt 211.0 lb

## 2012-09-02 DIAGNOSIS — E0590 Thyrotoxicosis, unspecified without thyrotoxic crisis or storm: Secondary | ICD-10-CM

## 2012-09-02 DIAGNOSIS — I4892 Unspecified atrial flutter: Secondary | ICD-10-CM

## 2012-09-02 DIAGNOSIS — I4891 Unspecified atrial fibrillation: Secondary | ICD-10-CM

## 2012-09-02 DIAGNOSIS — Z720 Tobacco use: Secondary | ICD-10-CM

## 2012-09-02 DIAGNOSIS — F17200 Nicotine dependence, unspecified, uncomplicated: Secondary | ICD-10-CM

## 2012-09-02 NOTE — Patient Instructions (Signed)
Your physician recommends that you schedule a follow-up appointment in:   6 WEEKS WITH DR Johney Frame Your physician recommends that you continue on your current medications as directed. Please refer to the Current Medication list given to you today.

## 2012-09-02 NOTE — Progress Notes (Signed)
PCP:  Donzetta Sprung, MD  The patient presents today for routine electrophysiology followup.  Since discharge from the hospital, he reports that he has had more atrial fibrillation, especially in the afternoons.  He feels like his rates are better controlled on Metoprolol; however, he feels like he is in a fog.    Labs from the hospital were notable for a TSH of <0.008.   Today, he denies symptoms of palpitations, chest pain, shortness of breath, orthopnea, PND, lower extremity edema, dizziness, presyncope, syncope, or neurologic sequela.  The patient feels that he is tolerating medications without difficulties and is otherwise without complaint today.   Past Medical History  Diagnosis Date  . Systolic heart failure     EF 09-81%-> 50-55% in 10/2011.  Marland Kitchen Sleep apnea   . Current smoker   . Anxiety   . Sinus trouble   . Atrial fibrillation     a. s/p RFCA x 2 (10/2011, 03/2012)  . Atrial flutter    Past Surgical History  Procedure Laterality Date  . Cholecystectomy    . Finger surgery    . Cardiac catheterization  2013    [facility]  . Vasectomy    . Atrial ablation surgery      Current Outpatient Prescriptions  Medication Sig Dispense Refill  . diltiazem (CARDIZEM CD) 120 MG 24 hr capsule Take 1 capsule (120 mg total) by mouth daily.  30 capsule  6  . ibuprofen (ADVIL,MOTRIN) 200 MG tablet Take 400 mg by mouth every 8 (eight) hours as needed. For pain      . metoprolol succinate (TOPROL-XL) 100 MG 24 hr tablet Take 1 tablet (100 mg total) by mouth daily.  30 tablet  6  . Rivaroxaban (XARELTO) 20 MG TABS Take 1 tablet by mouth daily.       No current facility-administered medications for this visit.    No Known Allergies  History   Social History  . Marital Status: Married    Spouse Name: N/A    Number of Children: N/A  . Years of Education: N/A   Occupational History  . 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 Smoking status:  Current Every Day Smoker -- 7.00 packs/day for 20 years    Types: Cigarettes  . Smokeless tobacco: Never Used  . Alcohol Use: Yes     Comment: drinks socially  . Drug Use: No  . Sexually Active: Yes   Other Topics Concern  . Not on file   Social History Narrative   Has 2 children    Family History  Problem Relation Age of Onset  . Atrial fibrillation Father     age 90  . Coronary artery disease      grandparents ?  Marland Kitchen Healthy Mother     age 59    ROS-  All systems are reviewed and are negative except as outlined in the HPI above  Physical Exam: Filed Vitals:   09/02/12 0951  BP: 112/74  Pulse: 71  Height: 6' (1.829 m)  Weight: 211 lb (95.709 kg)    GEN- The patient is well appearing, alert and oriented x 3 today.   Head- normocephalic, atraumatic Eyes-  Sclera clear, conjunctiva pink Ears- hearing intact Oropharynx- clear Neck- supple, no JVP Lymph- no cervical lymphadenopathy Lungs- Clear to ausculation bilaterally, normal work of breathing Heart- Regular rate and rhythm, no murmurs, rubs or gallops, PMI not laterally displaced  GI- soft, NT, ND, + BS Extremities- no clubbing, cyanosis, or edema MS- no significant deformity or atrophy Skin- no rash or lesion Psych- euthymic mood, full affect Neuro- strength and sensation are intact  ekg today reveals sinus rhythm 71 bpm, Qtc 431 TSH <.008  Assessment and Plan:  1. afib Better rate controlled with current regimen Can increase diltiazem to 240mg  daily if needed No changes today  2. Hyperthyroidism This is a new diagnosis.  He reports heat intolerance since May which is about when his afib/ atrial flutter burden increased.  I have spoken with Dr Reuel Boom today who will see him later this week for management of hyperthyroidism.  I will defer decisions regarding tikosyn until his thyroid has been assessed and controlled.  If he continues to have afib once euthyroid then I would consider tikosyn at that  time  3. Tobacco Cessation advised  Return to see me in 6 weeks

## 2012-09-26 ENCOUNTER — Telehealth: Payer: Self-pay | Admitting: Internal Medicine

## 2012-09-26 NOTE — Telephone Encounter (Signed)
New Prob     States her husband (pt) HR has been very high this morning (120-140) and would like to speak to a nurse regarding this. Please call.

## 2012-09-27 NOTE — Telephone Encounter (Signed)
Spoke with with yesterday and he was going to increase the Diltiazem dose to 240mg  daily and cut the Metoprolol back to it's normal dose which was 100mg  daily.  She would call me back if this did not help.  She is calling today and his HR is

## 2012-09-27 NOTE — Telephone Encounter (Signed)
Spoke to patient's wife today and he is still having afib with the feeling of it beating fast at times.  I have encouraged her to have him stay on the 240mg  of Diltiazem and if his HR is up he can take an additional Metoprolol to help slow down the rate. She is going to call if this does not help.  He is due to have his thyroid rechecked in 2 weeks.  This will also give [place] more insight if he needs that to be treated

## 2012-09-27 NOTE — Telephone Encounter (Signed)
New Prob     States pt continues to have irregular HR and has not gotten any better. Would like to speak to nurse regarding this.

## 2012-10-02 ENCOUNTER — Encounter: Payer: PRIVATE HEALTH INSURANCE | Admitting: Internal Medicine

## 2012-10-21 ENCOUNTER — Encounter: Payer: Self-pay | Admitting: Internal Medicine

## 2012-10-21 ENCOUNTER — Ambulatory Visit (INDEPENDENT_AMBULATORY_CARE_PROVIDER_SITE_OTHER): Payer: PRIVATE HEALTH INSURANCE | Admitting: Internal Medicine

## 2012-10-21 VITALS — BP 106/79 | HR 49 | Ht 72.0 in | Wt 206.0 lb

## 2012-10-21 DIAGNOSIS — I4892 Unspecified atrial flutter: Secondary | ICD-10-CM

## 2012-10-21 DIAGNOSIS — I4891 Unspecified atrial fibrillation: Secondary | ICD-10-CM

## 2012-10-21 NOTE — Progress Notes (Signed)
PCP:  Donzetta Sprung, MD  The patient presents today for routine electrophysiology followup.  Since his last office visit, he continues to have afib.  He has been evaluated by Dr Reuel Boom and placed on methimazole for subacute thyroiditis.  He is not certain that he feels any better yet.   Today, he denies symptoms of palpitations, chest pain, shortness of breath, orthopnea, PND, lower extremity edema, dizziness, presyncope, syncope, or neurologic sequela.  The patient feels that he is tolerating medications without difficulties and is otherwise without complaint today.   Past Medical History  Diagnosis Date  . Systolic heart failure     EF 16-10%-> 50-55% in 10/2011.  Marland Kitchen Sleep apnea   . Current smoker   . Anxiety   . Sinus trouble   . Atrial fibrillation     a. s/p RFCA x 2 (10/2011, 03/2012)  . Atrial flutter    Past Surgical History  Procedure Laterality Date  . Cholecystectomy    . Finger surgery    . Cardiac catheterization  2013    [facility]  . Vasectomy    . Atrial ablation surgery      Current Outpatient Prescriptions  Medication Sig Dispense Refill  . diltiazem (CARDIZEM CD) 120 MG 24 hr capsule Take 1 capsule (120 mg total) by mouth daily.  30 capsule  6  . ibuprofen (ADVIL,MOTRIN) 200 MG tablet Take 400 mg by mouth every 8 (eight) hours as needed. For pain      . methimazole (TAPAZOLE) 10 MG tablet Take 10 mg by mouth 3 (three) times daily.      . metoprolol succinate (TOPROL-XL) 100 MG 24 hr tablet Take 1 tablet (100 mg total) by mouth daily.  30 tablet  6  . Rivaroxaban (XARELTO) 20 MG TABS Take 1 tablet by mouth daily.       No current facility-administered medications for this visit.    No Known Allergies  History   Social History  . Marital Status: Married    Spouse Name: N/A    Number of Children: N/A  . Years of Education: N/A   Occupational History  . 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 Smoking status: Current  Every Day Smoker -- 7.00 packs/day for 20 years    Types: Cigarettes  . Smokeless tobacco: Never Used  . Alcohol Use: Yes     Comment: drinks socially  . Drug Use: No  . Sexual Activity: Yes   Other Topics Concern  . Not on file   Social History Narrative   Has 2 children    Family History  Problem Relation Age of Onset  . Atrial fibrillation Father     age 26  . Coronary artery disease      grandparents ?  Marland Kitchen Healthy Mother     age 29    ROS-  All systems are reviewed and are negative except as outlined in the HPI above  Physical Exam: Filed Vitals:   10/21/12 1037  BP: 106/79  Pulse: 49  Height: 6' (1.829 m)  Weight: 206 lb (93.441 kg)    GEN- The patient is well appearing, alert and oriented x 3 today.   Head- normocephalic, atraumatic Eyes-  Sclera clear, conjunctiva pink Ears- hearing intact Oropharynx- clear Neck- supple, no JVP Lymph- no cervical lymphadenopathy Lungs- Clear to ausculation bilaterally, normal work of breathing Heart- Regular rate and rhythm, no murmurs, rubs or gallops, PMI not  laterally displaced GI- soft, NT, ND, + BS Extremities- no clubbing, cyanosis, or edema Neuro- strength and sensation are intact  ekg today reveals initially atrial tachycardia which spontaneously converted to sinus rhythm  Assessment and Plan:  1. afib Better rate controlled with current regimen Can increase diltiazem to 240mg  daily if needed No changes today If he continues to have afib once his thyroid is corrected, then I would anticipate initiation of tikosyn or sotalol.  He may want to consider consultation with Dr Delena Serve who did his two recent ablations.  2. Hyperthyroidism This is a new diagnosis.  He will continue to follow closely with Dr Reuel Boom for management of hyperthyroidism.  I will defer decisions regarding tikosyn until his thyroid has been assessed and controlled.  If he continues to have afib once euthyroid then I would consider tikosyn at  that time  3. Tobacco Cessation advised  Return to see me in 8 weeks

## 2012-10-21 NOTE — Patient Instructions (Addendum)
Your physician recommends that you schedule a follow-up appointment in: 2 months with Dr Allred  

## 2012-11-11 ENCOUNTER — Other Ambulatory Visit: Payer: Self-pay | Admitting: *Deleted

## 2012-11-11 MED ORDER — DILTIAZEM HCL ER COATED BEADS 120 MG PO CP24: ORAL_CAPSULE | ORAL | Status: DC

## 2012-12-18 ENCOUNTER — Ambulatory Visit (INDEPENDENT_AMBULATORY_CARE_PROVIDER_SITE_OTHER): Payer: PRIVATE HEALTH INSURANCE | Admitting: Internal Medicine

## 2012-12-18 ENCOUNTER — Encounter: Payer: Self-pay | Admitting: Internal Medicine

## 2012-12-18 VITALS — BP 115/74 | HR 47 | Ht 72.0 in | Wt 211.0 lb

## 2012-12-18 DIAGNOSIS — I4891 Unspecified atrial fibrillation: Secondary | ICD-10-CM

## 2012-12-18 MED ORDER — METOPROLOL SUCCINATE ER 100 MG PO TB24: ORAL_TABLET | ORAL | Status: DC

## 2012-12-18 NOTE — Progress Notes (Signed)
PCP:  Donzetta Sprung, MD  The patient presents today for routine electrophysiology followup.  Since his last office visit, he feels that his afib is better.  He has been evaluated by Dr Reuel Boom and placed on methimazole for subacute thyroiditis.  He reports that his TFTs have normalized.  He has mostly done much better from an Afib standpoint but did have a lot of afib about a month ago while working in [place] and missing doses of medicines.  Today, he denies symptoms of chest pain, shortness of breath, orthopnea, PND, lower extremity edema, dizziness, presyncope, syncope, or neurologic sequela.  The patient feels that he is tolerating medications without difficulties and is otherwise without complaint today.   Past Medical History  Diagnosis Date  . Systolic heart failure     EF 16-10%-> 50-55% in 10/2011.  Marland Kitchen Sleep apnea   . Current smoker   . Anxiety   . Sinus trouble   . Atrial fibrillation     a. s/p RFCA x 2 (10/2011, 03/2012)  . Atrial flutter    Past Surgical History  Procedure Laterality Date  . Cholecystectomy    . Finger surgery    . Cardiac catheterization  2013    [facility]  . Vasectomy    . Atrial ablation surgery      Current Outpatient Prescriptions  Medication Sig Dispense Refill  . diltiazem (CARDIZEM CD) 120 MG 24 hr capsule TAKE ONE TABLET(120mg ) BY MOUTH DAILY. MAY TAKE AN EXTRA TABLET DAILY FOR A TOTAL OF (240mg ) IF NEEDED.  60 capsule  1  . ibuprofen (ADVIL,MOTRIN) 200 MG tablet Take 400 mg by mouth every 8 (eight) hours as needed. For pain      . methimazole (TAPAZOLE) 10 MG tablet Take 10 mg by mouth 3 (three) times daily.      . metoprolol succinate (TOPROL-XL) 100 MG 24 hr tablet Take 1 tablet (100 mg total) by mouth daily.  30 tablet  6  . Rivaroxaban (XARELTO) 20 MG TABS Take 1 tablet by mouth daily.       No current facility-administered medications for this visit.    No Known Allergies  History   Social History  . Marital Status: Married    Spouse  Name: N/A    Number of Children: N/A  . Years of Education: N/A   Occupational History  . 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 Smoking status: Current Every Day Smoker -- 7.00 packs/day for 20 years    Types: Cigarettes  . Smokeless tobacco: Never Used  . Alcohol Use: Yes     Comment: drinks socially  . Drug Use: No  . Sexual Activity: Yes   Other Topics Concern  . Not on file   Social History Narrative   Has 2 children    Family History  Problem Relation Age of Onset  . Atrial fibrillation Father     age 15  . Coronary artery disease      grandparents ?  Marland Kitchen Healthy Mother     age 23    ROS-  All systems are reviewed and are negative except as outlined in the HPI above  Physical Exam: Filed Vitals:   12/18/12 0852  BP: 115/74  Pulse: 47  Height: 6' (1.829 m)  Weight: 211 lb (95.709 kg)    GEN- The patient is well appearing, alert and oriented x 3 today.   Head- normocephalic, atraumatic Eyes-  Sclera clear, conjunctiva pink Ears- hearing intact Oropharynx- clear Neck- supple, no JVP Lymph- no cervical lymphadenopathy Lungs- Clear to ausculation bilaterally, normal work of breathing Heart- Regular rate and rhythm, no murmurs, rubs or gallops, PMI not laterally displaced GI- soft, NT, ND, + BS Extremities- no clubbing, cyanosis, or edema Neuro- strength and sensation are intact  ekg today reveals sinus rhythm 47 bpm, Qtc 428 msec  Assessment and Plan:  1. afib Better rate controlled with current regimen No changes today If he continues to have afib once his thyroid is corrected, then I would anticipate initiation of AAD therapy.  He may want to consider consultation with Dr Delena Serve who did his two recent ablations.  I will obtain an echo.  If EF remains normal, could consider flecainide as our AAD option.  Otherwise, consider tikosyn  2. Hyperthyroidism He will continue to follow closely with Dr Reuel Boom for  management of hyperthyroidism.    3. Tobacco Cessation advised  Return to see me in 8 weeks

## 2012-12-18 NOTE — Patient Instructions (Signed)
Your physician recommends that you schedule a follow-up appointment in: 3 months with Dr Johney Frame   Your physician has requested that you have an echocardiogram. Echocardiography is a painless test that uses sound waves to create images of your heart. It provides your doctor with information about the size and shape of your heart and how well your heart's chambers and valves are working. This procedure takes approximately one hour. There are no restrictions for this procedure.    Your physician recommends that you continue on your current medications as directed. Please refer to the Current Medication list given to you today.

## 2012-12-19 ENCOUNTER — Telehealth: Payer: Self-pay | Admitting: Internal Medicine

## 2012-12-19 NOTE — Telephone Encounter (Addendum)
Spoke with pt wife, pt went out of rhythm this morning and has remained out all day. His heart rate is averaging 140. He took extra cardizem this am at 11:30 and took extra metoprolol about 30 min ago. The pt is tired but no other symptoms. Will discuss with DOD, dr Graciela Husbands.

## 2012-12-19 NOTE — Telephone Encounter (Signed)
New problem:  Pt's wife states pt's heart is out of rhythm and heart rate is in the 140's. Pt's wife also states the pt has taken two doses of cardizem and metoprolol. Please advise

## 2012-12-19 NOTE — Telephone Encounter (Signed)
Discussed with dr Graciela Husbands, pt instucted to go to the ER tonight if his symptoms change. If he is still out of rhythm in the morning he is to call. He will not eat breakfast. If still in atrial fib will probably send to hosp for poss DCCV with allred. Pt wife voiced understanding.

## 2012-12-20 MED ORDER — FLECAINIDE ACETATE 100 MG PO TABS: 100.0000 mg | ORAL_TABLET | Freq: Two times a day (BID) | ORAL | Status: DC

## 2012-12-20 NOTE — Telephone Encounter (Signed)
Follow up    Heart rate this am is 138

## 2012-12-20 NOTE — Telephone Encounter (Signed)
Discussed with Dr Johney Frame Will have him start Flecainide 100mg  twice daily and follow up with me on Monday.  If still out of rhythm will set him up for DCCV  He agrees with paln and verbalizes understanding

## 2012-12-30 ENCOUNTER — Encounter: Payer: Self-pay | Admitting: Physician Assistant

## 2012-12-30 ENCOUNTER — Encounter: Payer: Self-pay | Admitting: *Deleted

## 2012-12-30 ENCOUNTER — Telehealth: Payer: Self-pay | Admitting: Internal Medicine

## 2012-12-30 ENCOUNTER — Ambulatory Visit (INDEPENDENT_AMBULATORY_CARE_PROVIDER_SITE_OTHER): Payer: PRIVATE HEALTH INSURANCE | Admitting: Physician Assistant

## 2012-12-30 VITALS — BP 112/80 | HR 96 | Ht 72.0 in | Wt 208.0 lb

## 2012-12-30 DIAGNOSIS — I4891 Unspecified atrial fibrillation: Secondary | ICD-10-CM

## 2012-12-30 DIAGNOSIS — E0590 Thyrotoxicosis, unspecified without thyrotoxic crisis or storm: Secondary | ICD-10-CM

## 2012-12-30 DIAGNOSIS — I4892 Unspecified atrial flutter: Secondary | ICD-10-CM

## 2012-12-30 NOTE — Telephone Encounter (Signed)
Patient coming in today to get worked up for DCCV for afib  His rates have been averaging 140-150 all weekend and he has been symptomatic with SOB.  His rates are good now but the medications are not seeming to be effective.  Dr Johney Frame recommended for him to have a DCCV if not converted over the weekend.  I have arranged for him to see the PA today, wife aware

## 2012-12-30 NOTE — Patient Instructions (Addendum)
Your physician assistant recommends that you schedule a follow-up appointment on 01/16/13 at 11:15AM with Dr. Hillis Range.   Your physician has recommended that you have a Cardioversion (DCCV).DCCV 01/01/13 @ 1 PM WITH DR. ALLRED; DX A-FIB, A-FLUTTER. Electrical Cardioversion uses a jolt of electricity to your heart either through paddles or wired patches attached to your chest. This is a controlled, usually prescheduled, procedure. Defibrillation is done under light anesthesia in the hospital, and you usually go home the day of the procedure. This is done to get your heart back into a normal rhythm. You are not awake for the procedure. Please see the instruction sheet given to you today.  LABS TODAY; BMET, CBC W/DIFF

## 2012-12-30 NOTE — H&P (Signed)
History and Physical Date:  12/30/2012   ID:  Jerry Richards, DOB 05-25-1970, MRN [id number]  PCP:  Donzetta Sprung, MD  Cardiologist:  Dr. Lorine Bears   Electrophysiologist:  Dr. Hillis Range    History of Present Illness: Jerry Richards is a 42 y.o. male with a hx of AFib/Flutter, s/p PVI ablation in [facility] 10/2011 and repeat ablation in 03/2012, NICM secondary to tachycardia, hyperthyroidism.  LHC (7/13):  No CAD, EF 35-40%.  Echo (9/13):  EF 50-55%, mild MR, mild LAE.  He was seen by Dr. Hillis Range recently and was doing fairly well.  He then called in with more episodes of AFib and was placed on Flecainide 100 bid.  He then started to have more episodes of rapid palpitations over this past weekend that is consistent with his prior arrhythmia.  He feels short of breath when his HR is high.  No chest pain.  No syncope.  No orthopnea, PND, edema.     Recent Labs: 08/28/2012: Creatinine 0.91; Hemoglobin 14.3; Potassium 3.8; TSH <0.008*   Wt Readings from Last 3 Encounters:  12/30/12 208 lb (94.348 kg)  12/18/12 211 lb (95.709 kg)  10/21/12 206 lb (93.441 kg)     Past Medical History  Diagnosis Date  . Systolic heart failure     EF 11-91%-> 50-55% in 10/2011.  Marland Kitchen Sleep apnea   . Current smoker   . Anxiety   . Sinus trouble   . Atrial fibrillation     a. s/p RFCA x 2 (10/2011, 03/2012)  . Atrial flutter     Current Outpatient Prescriptions  Medication Sig Dispense Refill  . diltiazem (CARDIZEM CD) 120 MG 24 hr capsule TAKE ONE TABLET(120mg ) BY MOUTH DAILY. MAY TAKE AN EXTRA TABLET DAILY FOR A TOTAL OF (240mg ) IF NEEDED.  60 capsule  1  . flecainide (TAMBOCOR) 100 MG tablet Take 1 tablet (100 mg total) by mouth 2 (two) times daily.  60 tablet  0  . ibuprofen (ADVIL,MOTRIN) 200 MG tablet Take 400 mg by mouth every 8 (eight) hours as needed. For pain      . methimazole (TAPAZOLE) 10 MG tablet Take 10 mg by mouth 3 (three) times daily.      . metoprolol succinate (TOPROL-XL) 100 MG 24  hr tablet Take one in the am and 1/2 in the pm  135 tablet  3  . Rivaroxaban (XARELTO) 20 MG TABS Take 1 tablet by mouth daily.       No current facility-administered medications for this visit.    Allergies:   Review of patient's allergies indicates no known allergies.   Social History:  The patient  reports that he has been smoking Cigarettes.  He has a 140 pack-year smoking history. He has never used smokeless tobacco. He reports that he drinks alcohol. He reports that he does not use illicit drugs.   Family History:  The patient's family history includes Atrial fibrillation in his father; Coronary artery disease in an other family member; Healthy in his mother.   ROS:  Please see the history of present illness.      All other systems reviewed and negative.   PHYSICAL EXAM: VS:  BP 112/80  Pulse 96  Ht 6' (1.829 m)  Wt 208 lb (94.348 kg)  BMI 28.20 kg/m2 Well nourished, well developed, in no acute distress HEENT: normal Neck: no JVD Cardiac:  normal S1, S2; RRR; no murmur Lungs:  clear to auscultation bilaterally, no wheezing, rhonchi or rales  Abd: soft, nontender, no hepatomegaly Ext: no edema Skin: warm and dry Neuro:  CNs 2-12 intact, no focal abnormalities noted  EKG:  AFlutter, HR 96, 2:1 conduction     ASSESSMENT AND PLAN:  1. Atrial Flutter:  Rate controlled.  He is very symptomatic.  He has undergone multiple procedures in the past.  He has been compliant with his Xarelto and has not missed a dose in several weeks.  I reviewed with Dr. Hillis Range by telephone today.  Will arrange DCCV tomorrow.  Continue Flecainide, Toprol, Diltiazem, Xarelto. 2. Hyperthyroidism:  Continue Methimazole and follow up with PCP. 3. Disposition:  F/u with Dr. Hillis Range 11/20 at 11:15AM.  Signed, Tereso Newcomer, PA-C  12/30/2012 3:52 PM

## 2012-12-30 NOTE — Progress Notes (Signed)
[address], [address] [place], [place]  [address] Phone: [phone] Fax:  [phone]  Date:  12/30/2012   ID:  Jerry Richards, DOB 1970/12/24, MRN [id number]  PCP:  Donzetta Sprung, MD  Cardiologist:  Dr. Lorine Bears   Electrophysiologist:  Dr. Hillis Range    History of Present Illness: Jerry Richards is a 42 y.o. male with a hx of AFib/Flutter, s/p PVI ablation in [facility] 10/2011 and repeat ablation in 03/2012, NICM secondary to tachycardia, hyperthyroidism.  LHC (7/13):  No CAD, EF 35-40%.  Echo (9/13):  EF 50-55%, mild MR, mild LAE.  He was seen by Dr. Hillis Range recently and was doing fairly well.  He then called in with more episodes of AFib and was placed on Flecainide 100 bid.  He then started to have more episodes of rapid palpitations over this past weekend that is consistent with his prior arrhythmia.  He feels short of breath when his HR is high.  No chest pain.  No syncope.  No orthopnea, PND, edema.     Recent Labs: 08/28/2012: Creatinine 0.91; Hemoglobin 14.3; Potassium 3.8; TSH <0.008*   Wt Readings from Last 3 Encounters:  12/30/12 208 lb (94.348 kg)  12/18/12 211 lb (95.709 kg)  10/21/12 206 lb (93.441 kg)     Past Medical History  Diagnosis Date  . Systolic heart failure     EF 69-62%-> 50-55% in 10/2011.  Marland Kitchen Sleep apnea   . Current smoker   . Anxiety   . Sinus trouble   . Atrial fibrillation     a. s/p RFCA x 2 (10/2011, 03/2012)  . Atrial flutter     Current Outpatient Prescriptions  Medication Sig Dispense Refill  . diltiazem (CARDIZEM CD) 120 MG 24 hr capsule TAKE ONE TABLET(120mg ) BY MOUTH DAILY. MAY TAKE AN EXTRA TABLET DAILY FOR A TOTAL OF (240mg ) IF NEEDED.  60 capsule  1  . flecainide (TAMBOCOR) 100 MG tablet Take 1 tablet (100 mg total) by mouth 2 (two) times daily.  60 tablet  0  . ibuprofen (ADVIL,MOTRIN) 200 MG tablet Take 400 mg by mouth every 8 (eight) hours as needed. For pain      . methimazole (TAPAZOLE) 10 MG tablet Take 10 mg by mouth  3 (three) times daily.      . metoprolol succinate (TOPROL-XL) 100 MG 24 hr tablet Take one in the am and 1/2 in the pm  135 tablet  3  . Rivaroxaban (XARELTO) 20 MG TABS Take 1 tablet by mouth daily.       No current facility-administered medications for this visit.    Allergies:   Review of patient's allergies indicates no known allergies.   Social History:  The patient  reports that he has been smoking Cigarettes.  He has a 140 pack-year smoking history. He has never used smokeless tobacco. He reports that he drinks alcohol. He reports that he does not use illicit drugs.   Family History:  The patient's family history includes Atrial fibrillation in his father; Coronary artery disease in an other family member; Healthy in his mother.   ROS:  Please see the history of present illness.      All other systems reviewed and negative.   PHYSICAL EXAM: VS:  BP 112/80  Pulse 96  Ht 6' (1.829 m)  Wt 208 lb (94.348 kg)  BMI 28.20 kg/m2 Well nourished, well developed, in no acute distress HEENT: normal Neck: no JVD Cardiac:  normal S1,  S2; RRR; no murmur Lungs:  clear to auscultation bilaterally, no wheezing, rhonchi or rales Abd: soft, nontender, no hepatomegaly Ext: no edema Skin: warm and dry Neuro:  CNs 2-12 intact, no focal abnormalities noted  EKG:  AFlutter, HR 96, 2:1 conduction     ASSESSMENT AND PLAN:  1. Atrial Flutter:  Rate controlled.  He is very symptomatic.  He has undergone multiple procedures in the past.  He has been compliant with his Xarelto and has not missed a dose in several weeks.  I reviewed with Dr. Hillis Range by telephone today.  Will arrange DCCV tomorrow.  Continue Flecainide, Toprol, Diltiazem, Xarelto. 2. Hyperthyroidism:  Continue Methimazole and follow up with PCP. 3. Disposition:  F/u with Dr. Hillis Range 11/20 at 11:15AM.  Signed, Tereso Newcomer, PA-C  12/30/2012 3:52 PM

## 2012-12-30 NOTE — Telephone Encounter (Signed)
New Problem   Pt wife called- states that the pt has started flecainide and it is not working/// Heart rate is still high, 11/22 running 140-150// please call back to discuss

## 2012-12-31 ENCOUNTER — Other Ambulatory Visit: Payer: PRIVATE HEALTH INSURANCE

## 2012-12-31 ENCOUNTER — Ambulatory Visit (INDEPENDENT_AMBULATORY_CARE_PROVIDER_SITE_OTHER): Payer: PRIVATE HEALTH INSURANCE | Admitting: *Deleted

## 2012-12-31 VITALS — BP 112/72 | HR 106

## 2012-12-31 DIAGNOSIS — I4891 Unspecified atrial fibrillation: Secondary | ICD-10-CM

## 2012-12-31 LAB — CBC WITH DIFFERENTIAL/PLATELET
Basophils Absolute: 0 10*3/uL (ref 0.0–0.1)
Eosinophils Absolute: 0.2 10*3/uL (ref 0.0–0.7)
HCT: 45.4 % (ref 39.0–52.0)
Hemoglobin: 15.7 g/dL (ref 13.0–17.0)
Lymphs Abs: 2.1 10*3/uL (ref 0.7–4.0)
MCV: 90.4 fl (ref 78.0–100.0)
Monocytes Absolute: 0.5 10*3/uL (ref 0.1–1.0)
Neutro Abs: 5.7 10*3/uL (ref 1.4–7.7)
Platelets: 196 10*3/uL (ref 150.0–400.0)
RDW: 15.2 % — ABNORMAL HIGH (ref 11.5–14.6)

## 2012-12-31 LAB — BASIC METABOLIC PANEL
Calcium: 9.2 mg/dL (ref 8.4–10.5)
GFR: 67.76 mL/min (ref >60.00)
Potassium: 4.1 mEq/L (ref 3.5–5.1)
Sodium: 140 mEq/L (ref 135–145)

## 2012-12-31 MED ORDER — FLECAINIDE ACETATE 150 MG PO TABS: 150.0000 mg | ORAL_TABLET | Freq: Two times a day (BID) | ORAL | Status: DC

## 2012-12-31 NOTE — Progress Notes (Signed)
PT  WALKED INTO OFFICE  WITH  C/O OF   HEART RATE  RUNNING APPROX 195   30 MIN PRIOR TO GETTING HERE   DISCUSSED  WITH  DR Johney Frame   AS WELL AS  SCOTT WEAVER PAC AND DR Ladona Ridgel   PT  WAS  SEEN YESTERDAY   AND  IS SCHEDULED FOR  DCCV TOM   CALLED  AND  SPOKE  WITH  AMBER SEILER  RN  WHOM DISCUSSED WITH  DR ALLRED    PER  DR ALLRED  INCREASE  FLECAINIDE TO  150 MG  BID  SEE  DR ALLRED NEXT WEEK  APPT 01-06-13 AT  12:30 ,CANCEL DCCV FOR TOM ,  AND  TO  CALL DR WHARTON TO DISCUSS  POSSIBLE NEED FOR ANOTHER  ABLATION PT  AWARE WAS INSTRUCTED   IF  DEVELOPS INCREASED HEART RATE  AGAIN TO GO TO ER  FOR EVAL  AND TX./CY

## 2013-01-01 ENCOUNTER — Ambulatory Visit (HOSPITAL_COMMUNITY)
Admission: RE | Admit: 2013-01-01 | Payer: PRIVATE HEALTH INSURANCE | Source: Ambulatory Visit | Admitting: Internal Medicine

## 2013-01-01 ENCOUNTER — Encounter (HOSPITAL_COMMUNITY): Admission: RE | Payer: Self-pay | Source: Ambulatory Visit

## 2013-01-01 SURGERY — CARDIOVERSION
Anesthesia: Monitor Anesthesia Care

## 2013-01-02 ENCOUNTER — Other Ambulatory Visit: Payer: Self-pay

## 2013-01-03 ENCOUNTER — Other Ambulatory Visit (HOSPITAL_COMMUNITY): Payer: PRIVATE HEALTH INSURANCE

## 2013-01-03 DIAGNOSIS — E0580 Other thyrotoxicosis without thyrotoxic crisis or storm: Secondary | ICD-10-CM | POA: Insufficient documentation

## 2013-01-06 ENCOUNTER — Ambulatory Visit: Payer: PRIVATE HEALTH INSURANCE | Admitting: Internal Medicine

## 2013-01-09 ENCOUNTER — Telehealth: Payer: Self-pay

## 2013-01-09 ENCOUNTER — Other Ambulatory Visit: Payer: Self-pay

## 2013-01-09 DIAGNOSIS — I4891 Unspecified atrial fibrillation: Secondary | ICD-10-CM

## 2013-01-09 MED ORDER — METOPROLOL SUCCINATE ER 100 MG PO TB24: ORAL_TABLET | ORAL | Status: DC

## 2013-01-09 NOTE — Telephone Encounter (Signed)
[facility] states patient does not need prior authorization for medication, picked up and cost him $6.00 dollars.I called wife and let her know this information.

## 2013-01-10 ENCOUNTER — Other Ambulatory Visit: Payer: Self-pay | Admitting: *Deleted

## 2013-01-10 MED ORDER — DILTIAZEM HCL ER COATED BEADS 120 MG PO CP24: ORAL_CAPSULE | ORAL | Status: DC

## 2013-01-15 ENCOUNTER — Other Ambulatory Visit (HOSPITAL_COMMUNITY): Payer: PRIVATE HEALTH INSURANCE

## 2013-01-16 ENCOUNTER — Encounter (HOSPITAL_COMMUNITY): Payer: Self-pay | Admitting: Internal Medicine

## 2013-01-16 ENCOUNTER — Ambulatory Visit: Payer: PRIVATE HEALTH INSURANCE | Admitting: Internal Medicine

## 2013-02-10 ENCOUNTER — Encounter: Payer: Self-pay | Admitting: Internal Medicine

## 2013-02-10 ENCOUNTER — Ambulatory Visit (INDEPENDENT_AMBULATORY_CARE_PROVIDER_SITE_OTHER): Payer: PRIVATE HEALTH INSURANCE | Admitting: Internal Medicine

## 2013-02-10 VITALS — BP 126/76 | HR 52 | Ht 72.0 in | Wt 217.0 lb

## 2013-02-10 DIAGNOSIS — I4891 Unspecified atrial fibrillation: Secondary | ICD-10-CM

## 2013-02-10 DIAGNOSIS — I4892 Unspecified atrial flutter: Secondary | ICD-10-CM

## 2013-02-10 NOTE — Progress Notes (Signed)
PCP:  Donzetta Sprung, MD  The patient presents today for routine electrophysiology followup.  Since his last office visit, he feels that his afib is better. Unfortunately he continues to have some episodes.  He has seen Dr Delena Serve and is scheduled for repeat ablation later this month.  Today, he denies symptoms of chest pain, shortness of breath, orthopnea, PND, lower extremity edema, dizziness, presyncope, syncope, or neurologic sequela.  The patient feels that he is tolerating medications without difficulties and is otherwise without complaint today.   Past Medical History  Diagnosis Date  . Systolic heart failure     EF 16-10%-> 50-55% in 10/2011. MOST RECENT ECHO (03/2012) EF= 66%  . Sleep apnea   . Current smoker   . Anxiety   . Sinus trouble   . Atrial fibrillation     a. s/p RFCA x 2 (10/26/2011, 04/15/2012)  . Atrial flutter   . Hyperthyroidism     Secondary to amiodarone  . OSA on CPAP   . Non-ischemic cardiomyopathy   . LA thrombus   . 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 Chronic kidney disease    Past Surgical History  Procedure Laterality Date  . Cholecystectomy    . Finger surgery    . Cardiac catheterization  08/2011    [facility]  . Vasectomy    . Atrial ablation surgery      Current Outpatient Prescriptions  Medication Sig Dispense Refill  . diltiazem (CARDIZEM CD) 120 MG 24 hr capsule TAKE ONE TABLET(120mg ) BY MOUTH DAILY. MAY TAKE AN EXTRA TABLET DAILY FOR A TOTAL OF (240mg ) IF NEEDED.  60 capsule  1  . flecainide (TAMBOCOR) 150 MG tablet Take 1 tablet (150 mg total) by mouth 2 (two) times daily.  60 tablet  6  . methimazole (TAPAZOLE) 10 MG tablet Take 10 mg by mouth 3 (three) times daily.      . metoprolol succinate (TOPROL-XL) 100 MG 24 hr tablet Take one in the am and 1/2 in the pm  90 tablet  1  . Rivaroxaban (XARELTO) 20 MG TABS Take 1 tablet by mouth daily.       No current facility-administered medications for this visit.    Allergies  Allergen Reactions  .  Amiodarone     AMIODARONE ANALOGUES    History   Social History  . Marital Status: Married    Spouse Name: N/A    Number of Children: N/A  . Years of Education: N/A   Occupational History  . 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 Smoking status: Current Every Day Smoker -- 7.00 packs/day for 20 years    Types: Cigarettes  . Smokeless tobacco: Never Used  . Alcohol Use: Yes     Comment: drinks socially  . Drug Use: No  . Sexual Activity: Yes   Other Topics Concern  . Not on file   Social History Narrative   Has 2 children    Family History  Problem Relation Age of Onset  . Atrial fibrillation Father     age 20  . Cancer Father     melanoma  . Healthy Mother     age 42  . Cancer Mother     melanoma  . Atrial fibrillation Paternal Aunt   . Atrial fibrillation Paternal Uncle   . Coronary artery disease Paternal Grandmother   . Heart disease Paternal Grandmother   . Heart attack Paternal Grandmother   .  Coronary artery disease Paternal Grandfather   . Heart disease Paternal Grandfather   . Heart attack Paternal Grandfather     ROS-  All systems are reviewed and are negative except as outlined in the HPI above  Physical Exam: Filed Vitals:   02/10/13 1457  BP: 126/76  Pulse: 52  Height: 6' (1.829 m)  Weight: 217 lb (98.431 kg)    GEN- The patient is well appearing, alert and oriented x 3 today.   Head- normocephalic, atraumatic Eyes-  Sclera clear, conjunctiva pink Ears- hearing intact Oropharynx- clear Neck- supple, no JVP Lymph- no cervical lymphadenopathy Lungs- Clear to ausculation bilaterally, normal work of breathing Heart- Regular rate and rhythm, no murmurs, rubs or gallops, PMI not laterally displaced GI- soft, NT, ND, + BS Extremities- no clubbing, cyanosis, or edema Neuro- strength and sensation are intact  ekg today reveals sinus rhythm 52 bpm, Qtc 407 msec  Assessment and Plan:  1.  afib Plans for ablation are noted No changes today  2. Hyperthyroidism He will continue to follow closely with Dr Reuel Boom for management of hyperthyroidism.   3. Tobacco Cessation advised  Return to see me in 3 months

## 2013-02-10 NOTE — Patient Instructions (Signed)
Your physician recommends that you continue on your current medications as directed. Please refer to the Current Medication list given to you today.  Your physician recommends that you schedule a follow-up appointment in: 3 months with Dr. Allred.   

## 2013-02-25 DIAGNOSIS — Z8679 Personal history of other diseases of the circulatory system: Secondary | ICD-10-CM | POA: Insufficient documentation

## 2013-03-08 ENCOUNTER — Other Ambulatory Visit: Payer: Self-pay | Admitting: Internal Medicine

## 2013-03-11 ENCOUNTER — Encounter: Payer: Self-pay | Admitting: Internal Medicine

## 2013-03-26 ENCOUNTER — Encounter: Payer: Self-pay | Admitting: Internal Medicine

## 2013-05-21 ENCOUNTER — Ambulatory Visit (INDEPENDENT_AMBULATORY_CARE_PROVIDER_SITE_OTHER): Payer: PRIVATE HEALTH INSURANCE | Admitting: Internal Medicine

## 2013-05-21 DIAGNOSIS — R000 Tachycardia, unspecified: Secondary | ICD-10-CM

## 2013-05-21 DIAGNOSIS — I43 Cardiomyopathy in diseases classified elsewhere: Secondary | ICD-10-CM

## 2013-05-23 ENCOUNTER — Encounter: Payer: Self-pay | Admitting: Internal Medicine

## 2013-06-05 NOTE — Progress Notes (Signed)
PT was a no show for this appointment

## 2013-06-30 DIAGNOSIS — Q268 Other congenital malformations of great veins: Secondary | ICD-10-CM | POA: Insufficient documentation

## 2014-02-05 ENCOUNTER — Encounter (HOSPITAL_COMMUNITY): Payer: Self-pay | Admitting: Cardiology

## 2014-02-11 DIAGNOSIS — I495 Sick sinus syndrome: Secondary | ICD-10-CM | POA: Insufficient documentation

## 2015-03-17 MED FILL — XARELTO 20 MG TABLET: 20 | 30 days supply | Qty: 30 | Fill #1

## 2015-04-21 MED FILL — XARELTO 20 MG TABLET: 20 | 30 days supply | Qty: 30 | Fill #2

## 2015-05-19 MED FILL — XARELTO 20 MG TABLET: 20 | 30 days supply | Qty: 30 | Fill #3

## 2015-06-03 DIAGNOSIS — R6883 Chills (without fever): Secondary | ICD-10-CM | POA: Diagnosis not present

## 2015-06-03 DIAGNOSIS — N10 Acute tubulo-interstitial nephritis: Secondary | ICD-10-CM | POA: Diagnosis not present

## 2015-06-03 DIAGNOSIS — M791 Myalgia: Secondary | ICD-10-CM | POA: Diagnosis not present

## 2015-06-03 DIAGNOSIS — I480 Paroxysmal atrial fibrillation: Secondary | ICD-10-CM | POA: Diagnosis not present

## 2015-06-03 DIAGNOSIS — R319 Hematuria, unspecified: Secondary | ICD-10-CM | POA: Diagnosis not present

## 2015-06-04 DIAGNOSIS — R319 Hematuria, unspecified: Secondary | ICD-10-CM | POA: Diagnosis not present

## 2015-06-04 DIAGNOSIS — R109 Unspecified abdominal pain: Secondary | ICD-10-CM | POA: Diagnosis not present

## 2015-06-04 DIAGNOSIS — R918 Other nonspecific abnormal finding of lung field: Secondary | ICD-10-CM | POA: Diagnosis not present

## 2015-06-04 DIAGNOSIS — N2889 Other specified disorders of kidney and ureter: Secondary | ICD-10-CM | POA: Diagnosis not present

## 2015-06-07 DIAGNOSIS — J189 Pneumonia, unspecified organism: Secondary | ICD-10-CM | POA: Diagnosis not present

## 2015-06-07 DIAGNOSIS — I480 Paroxysmal atrial fibrillation: Secondary | ICD-10-CM | POA: Diagnosis not present

## 2015-06-07 DIAGNOSIS — R319 Hematuria, unspecified: Secondary | ICD-10-CM | POA: Diagnosis not present

## 2015-06-07 DIAGNOSIS — N289 Disorder of kidney and ureter, unspecified: Secondary | ICD-10-CM | POA: Diagnosis not present

## 2015-06-14 DIAGNOSIS — I480 Paroxysmal atrial fibrillation: Secondary | ICD-10-CM | POA: Diagnosis not present

## 2015-06-14 DIAGNOSIS — N289 Disorder of kidney and ureter, unspecified: Secondary | ICD-10-CM | POA: Diagnosis not present

## 2015-06-21 DIAGNOSIS — J189 Pneumonia, unspecified organism: Secondary | ICD-10-CM | POA: Diagnosis not present

## 2015-06-21 DIAGNOSIS — N179 Acute kidney failure, unspecified: Secondary | ICD-10-CM | POA: Diagnosis not present

## 2015-06-24 MED FILL — XARELTO 20 MG TABLET: 20 | 30 days supply | Qty: 30 | Fill #0

## 2015-07-07 DIAGNOSIS — N179 Acute kidney failure, unspecified: Secondary | ICD-10-CM | POA: Diagnosis not present

## 2015-07-16 DIAGNOSIS — N183 Chronic kidney disease, stage 3 (moderate): Secondary | ICD-10-CM | POA: Diagnosis not present

## 2015-07-16 DIAGNOSIS — Z6829 Body mass index (BMI) 29.0-29.9, adult: Secondary | ICD-10-CM | POA: Diagnosis not present

## 2015-07-23 DIAGNOSIS — N179 Acute kidney failure, unspecified: Secondary | ICD-10-CM | POA: Diagnosis not present

## 2015-08-10 DIAGNOSIS — N179 Acute kidney failure, unspecified: Secondary | ICD-10-CM | POA: Diagnosis not present

## 2015-08-13 ENCOUNTER — Other Ambulatory Visit (HOSPITAL_COMMUNITY): Payer: Self-pay | Admitting: Nephrology

## 2015-08-13 DIAGNOSIS — N179 Acute kidney failure, unspecified: Secondary | ICD-10-CM

## 2015-08-16 ENCOUNTER — Other Ambulatory Visit: Payer: Self-pay | Admitting: Radiology

## 2015-08-17 ENCOUNTER — Encounter (HOSPITAL_COMMUNITY): Payer: Self-pay

## 2015-08-17 ENCOUNTER — Ambulatory Visit (HOSPITAL_COMMUNITY)
Admission: RE | Admit: 2015-08-17 | Discharge: 2015-08-17 | Disposition: A | Discharge status: Home / Self Care | Payer: BLUE CROSS/BLUE SHIELD | Source: Ambulatory Visit | Attending: Nephrology | Admitting: Nephrology

## 2015-08-17 DIAGNOSIS — F17210 Nicotine dependence, cigarettes, uncomplicated: Secondary | ICD-10-CM | POA: Insufficient documentation

## 2015-08-17 DIAGNOSIS — Z79899 Other long term (current) drug therapy: Secondary | ICD-10-CM | POA: Insufficient documentation

## 2015-08-17 DIAGNOSIS — Z7901 Long term (current) use of anticoagulants: Secondary | ICD-10-CM | POA: Insufficient documentation

## 2015-08-17 DIAGNOSIS — N189 Chronic kidney disease, unspecified: Secondary | ICD-10-CM | POA: Insufficient documentation

## 2015-08-17 DIAGNOSIS — G4733 Obstructive sleep apnea (adult) (pediatric): Secondary | ICD-10-CM | POA: Diagnosis not present

## 2015-08-17 DIAGNOSIS — N059 Unspecified nephritic syndrome with unspecified morphologic changes: Secondary | ICD-10-CM | POA: Diagnosis not present

## 2015-08-17 DIAGNOSIS — F419 Anxiety disorder, unspecified: Secondary | ICD-10-CM | POA: Insufficient documentation

## 2015-08-17 DIAGNOSIS — N179 Acute kidney failure, unspecified: Secondary | ICD-10-CM | POA: Diagnosis not present

## 2015-08-17 LAB — CBC
HEMATOCRIT: 32.1 % — AB (ref 39.0–52.0)
HEMOGLOBIN: 10.9 g/dL — AB (ref 13.0–17.0)
MCH: 30 pg (ref 26.0–34.0)
MCHC: 34 g/dL (ref 30.0–36.0)
MCV: 88.4 fL (ref 78.0–100.0)
Platelets: 209 10*3/uL (ref 150–400)
RBC: 3.63 MIL/uL — ABNORMAL LOW (ref 4.22–5.81)
RDW: 13.5 % (ref 11.5–15.5)
WBC: 5.8 10*3/uL (ref 4.0–10.5)

## 2015-08-17 LAB — APTT: APTT: 29 s (ref 24–37)

## 2015-08-17 LAB — PROTIME-INR
INR: 1.01 (ref 0.00–1.49)
Prothrombin Time: 13.5 seconds (ref 11.6–15.2)

## 2015-08-17 MED ORDER — SODIUM CHLORIDE 0.9 % IV SOLN: Freq: Once | INTRAVENOUS | Status: DC

## 2015-08-17 MED ORDER — LIDOCAINE HCL (PF) 1 % IJ SOLN
INTRAMUSCULAR | Status: AC
  Filled 2015-08-17: qty 10

## 2015-08-17 MED ORDER — GELATIN ABSORBABLE 12-7 MM EX MISC
CUTANEOUS | Status: AC
  Filled 2015-08-17: qty 1

## 2015-08-17 MED ORDER — FENTANYL CITRATE (PF) 100 MCG/2ML IJ SOLN
INTRAMUSCULAR | Status: AC
  Filled 2015-08-17: qty 2

## 2015-08-17 MED ORDER — SODIUM CHLORIDE 0.9 % IV SOLN
INTRAVENOUS | Status: AC | PRN
  Administered 2015-08-17: 10 mL/h via INTRAVENOUS

## 2015-08-17 MED ORDER — MIDAZOLAM HCL 2 MG/2ML IJ SOLN
INTRAMUSCULAR | Status: AC | PRN
  Administered 2015-08-17: 1 mg via INTRAVENOUS

## 2015-08-17 MED ORDER — FENTANYL CITRATE (PF) 100 MCG/2ML IJ SOLN
INTRAMUSCULAR | Status: AC | PRN
  Administered 2015-08-17: 50 ug via INTRAVENOUS

## 2015-08-17 MED ORDER — MIDAZOLAM HCL 2 MG/2ML IJ SOLN
INTRAMUSCULAR | Status: AC
  Filled 2015-08-17: qty 2

## 2015-08-17 NOTE — H&P (Signed)
Chief Complaint: Patient was seen in consultation today for random renal biopsy at the request of Mattingly,Michael  Referring Physician(s): Mattingly,Michael  Supervising Physician: Daryll Brod  Patient Status: Outpatient  History of Present Illness: Jerry Richards is a 45 y.o. male   Pt with 2 month Hx hematuria Creatinine level rising per Dr Mercy Moore Now request for random renal biopsy Atrial fib--- Last dose Xarelto 08/09/2015   Past Medical History  Diagnosis Date  . Systolic heart failure (HCC)     EF 20-25%-> 50-55% in 10/2011. MOST RECENT ECHO (03/2012) EF= 66%  . Sleep apnea   . Current smoker   . Anxiety   . Sinus trouble   . Atrial fibrillation ([place])     a. s/p RFCA x 2 (10/26/2011, 04/15/2012)  . Atrial flutter ([place])   . Hyperthyroidism     Secondary to amiodarone  . OSA on CPAP   . Non-ischemic cardiomyopathy ([place])   . LA thrombus ([place])   . 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 Chronic kidney disease     Past Surgical History  Procedure Laterality Date  . Cholecystectomy    . Finger surgery    . Cardiac catheterization  08/2011    [facility]  . Vasectomy    . Atrial ablation surgery    . Left heart catheterization with coronary angiogram N/A 09/12/2011    Procedure: LEFT HEART CATHETERIZATION WITH CORONARY ANGIOGRAM;  Surgeon: Hillary Bow, MD;  Location: [facility] CATH LAB;  Service: Cardiovascular;  Laterality: N/A;    Allergies: Amiodarone  Medications: Prior to Admission medications   Medication Sig Start Date End Date Taking? Authorizing Provider  Rivaroxaban (XARELTO) 20 MG TABS Take 1 tablet by mouth daily.   Yes Historical Provider, MD  diltiazem (CARDIZEM CD) 120 MG 24 hr capsule TAKE ONE CAPSULE BY MOUTH DAILY **MAY TAKE EXTRA CAPSULE DAILY FOR TOTAL OF 240MG  IF NEEDED 03/08/13   Thompson Grayer, MD  flecainide (TAMBOCOR) 150 MG tablet Take 1 tablet (150 mg total) by mouth 2 (two) times daily. 12/31/12   Thompson Grayer, MD  methimazole (TAPAZOLE) 10  MG tablet Take 10 mg by mouth 3 (three) times daily.    Historical Provider, MD  metoprolol succinate (TOPROL-XL) 100 MG 24 hr tablet Take 1 tablet in the morning and 1/2 tablet in the evening 01/09/13   Thompson Grayer, MD     Family History  Problem Relation Age of Onset  . Atrial fibrillation Father     age 72  . Cancer Father     melanoma  . Healthy Mother     age 50  . Cancer Mother     melanoma  . Atrial fibrillation Paternal Aunt   . Atrial fibrillation Paternal Uncle   . Coronary artery disease Paternal Grandmother   . Heart disease Paternal Grandmother   . Heart attack Paternal Grandmother   . Coronary artery disease Paternal Grandfather   . Heart disease Paternal Grandfather   . Heart attack Paternal Grandfather     Social History   Social History  . Marital Status: Married    Spouse Name: N/A  . Number of Children: N/A  . Years of Education: N/A   Occupational History  . 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 Smoking status: Current Every Day Smoker -- 7.00 packs/day for 20 years    Types: Cigarettes  . Smokeless tobacco: Never Used  . Alcohol Use:  Yes     Comment: drinks socially  . Drug Use: No  . Sexual Activity: Yes   Other Topics Concern  . None   Social History Narrative   Has 2 children     Review of Systems: A 12 point ROS discussed and pertinent positives are indicated in the HPI above.  All other systems are negative.  Review of Systems  Constitutional: Negative for fever, activity change, appetite change and fatigue.  Respiratory: Negative for cough and shortness of breath.   Musculoskeletal: Negative for back pain.  Neurological: Negative for weakness.  Psychiatric/Behavioral: Negative for behavioral problems and confusion.    Vital Signs: BP 121/85 mmHg  Pulse 45  Temp(Src) 97.8 F (36.6 C) (Oral)  Resp 16  Ht 6' (1.829 m)  Wt 210 lb (95.255 kg)  BMI 28.47 kg/m2  SpO2 100%  Physical  Exam  Constitutional: He is oriented to person, place, and time.  Cardiovascular: Regular rhythm.   Irreg rate  Pulmonary/Chest: Effort normal and breath sounds normal. He has no wheezes.  Abdominal: Soft. Bowel sounds are normal. There is no tenderness.  Musculoskeletal: Normal range of motion.  Neurological: He is alert and oriented to person, place, and time.  Skin: Skin is warm and dry.  Psychiatric: He has a normal mood and affect. His behavior is normal. Judgment and thought content normal.  Nursing note and vitals reviewed.   Mallampati Score:  MD Evaluation Airway: WNL Heart: WNL Abdomen: WNL Chest/ Lungs: WNL ASA  Classification: 2 Mallampati/Airway Score: One  Imaging: No results found.  Labs:  CBC:  Recent Labs  08/17/15 0620  WBC 5.8  HGB 10.9*  HCT 32.1*  PLT 209    COAGS:  Recent Labs  08/17/15 0620  INR 1.01  APTT 29    BMP: No results for input(s): NA, K, CL, CO2, GLUCOSE, BUN, CALCIUM, CREATININE, GFRNONAA, GFRAA in the last 8760 hours.  Invalid input(s): CMP  LIVER FUNCTION TESTS: No results for input(s): BILITOT, AST, ALT, ALKPHOS, PROT, ALBUMIN in the last 8760 hours.  TUMOR MARKERS: No results for input(s): AFPTM, CEA, CA199, CHROMGRNA in the last 8760 hours.  Assessment and Plan:  Microscopic hematuria Rising creatinine x 2 mo Referred to Dr Mercy Moore Scheduled now for random renal bx Risks and Benefits discussed with the patient including, but not limited to bleeding, infection, damage to adjacent structures or low yield requiring additional tests. All of the patient's questions were answered, patient is agreeable to proceed. Consent signed and in chart.  Thank you for this interesting consult.  I greatly enjoyed meeting SARGENT GAPP and look forward to participating in their care.  A copy of this report was sent to the requesting provider on this date.  Electronically Signed: Monia Sabal A 08/17/2015, 7:11 AM   I  spent a total of  30 Minutes   in face to face in clinical consultation, greater than 50% of which was counseling/coordinating care for random renal bx

## 2015-08-17 NOTE — Sedation Documentation (Signed)
Patient denies pain and is resting comfortably.  

## 2015-08-17 NOTE — Procedures (Signed)
Successful LT RENAL US BX NO COMP STABLE EBL 0 PATH PENDING FULL REPORT IN PACS

## 2015-08-17 NOTE — Discharge Instructions (Signed)
Kidney Biopsy, Care After °Refer to this sheet in the next few weeks. These instructions provide you with information on caring for yourself after your procedure. Your health care provider may also give you more specific instructions. Your treatment has been planned according to current medical practices, but problems sometimes occur. Call your health care provider if you have any problems or questions after your procedure.  °WHAT TO EXPECT AFTER THE PROCEDURE  °· You may notice blood in the urine for the first 24 hours after the biopsy. °· You may feel some pain at the biopsy site for 1-2 weeks after the biopsy. °HOME CARE INSTRUCTIONS °· Do not lift anything heavier than 10 lb (4.5 kg) for 2 weeks. °· Do not take any non-steroidal anti-inflammatory drugs (NSAIDs) or any blood thinners for a week after the biopsy unless instructed to do so by your health care provider. °· Only take medicines for pain, fever, or discomfort as directed by your health care provider. °SEEK MEDICAL CARE IF: °· You have bloody urine more than 24 hours after the biopsy.   °· You develop a fever.   °· You cannot urinate.   °· You have increasing pain at the biopsy site.   °SEEK IMMEDIATE MEDICAL CARE IF: °You feel faint or dizzy.  °  °This information is not intended to replace advice given to you by your health care provider. Make sure you discuss any questions you have with your health care provider. °  °Document Released: 10/16/2012 Document Reviewed: 10/16/2012 °Elsevier Interactive Patient Education ©2016 [employer] ° °

## 2015-09-01 ENCOUNTER — Encounter (HOSPITAL_COMMUNITY): Payer: Self-pay

## 2015-09-01 ENCOUNTER — Other Ambulatory Visit (HOSPITAL_COMMUNITY): Payer: Self-pay | Admitting: Nephrology

## 2015-09-01 DIAGNOSIS — N183 Chronic kidney disease, stage 3 unspecified: Secondary | ICD-10-CM

## 2015-09-01 DIAGNOSIS — N028 Recurrent and persistent hematuria with other morphologic changes: Secondary | ICD-10-CM

## 2015-09-06 ENCOUNTER — Ambulatory Visit (HOSPITAL_COMMUNITY)
Admission: RE | Admit: 2015-09-06 | Discharge: 2015-09-06 | Disposition: A | Discharge status: Home / Self Care | Payer: BLUE CROSS/BLUE SHIELD | Source: Ambulatory Visit | Attending: Nephrology | Admitting: Nephrology

## 2015-09-06 DIAGNOSIS — N183 Chronic kidney disease, stage 3 unspecified: Secondary | ICD-10-CM

## 2015-09-06 DIAGNOSIS — N028 Recurrent and persistent hematuria with other morphologic changes: Secondary | ICD-10-CM

## 2015-09-10 DIAGNOSIS — N179 Acute kidney failure, unspecified: Secondary | ICD-10-CM | POA: Diagnosis not present

## 2015-09-10 DIAGNOSIS — I10 Essential (primary) hypertension: Secondary | ICD-10-CM | POA: Diagnosis not present

## 2015-09-10 DIAGNOSIS — Z6829 Body mass index (BMI) 29.0-29.9, adult: Secondary | ICD-10-CM | POA: Diagnosis not present

## 2015-09-10 MED FILL — XARELTO 20 MG TABLET: 20 | 30 days supply | Qty: 30 | Fill #1

## 2015-09-17 DIAGNOSIS — N179 Acute kidney failure, unspecified: Secondary | ICD-10-CM | POA: Diagnosis not present

## 2015-09-21 IMAGING — US US BIOPSY
1 series · 13 of 14 positions shown · non-contrast
Comparison: none

INDICATION: Acute kidney injury

[Series 1: us biopsy · 0.20mm/px · 13 of 14 slices shown]
[im 1/14]
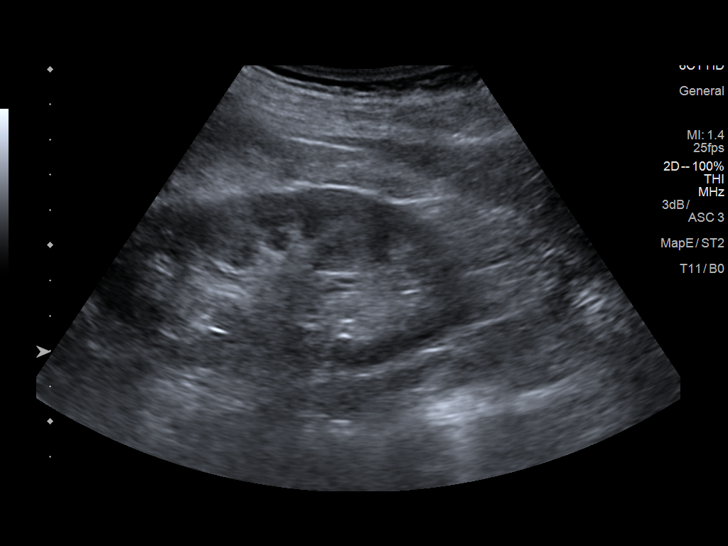
[im 2/14]
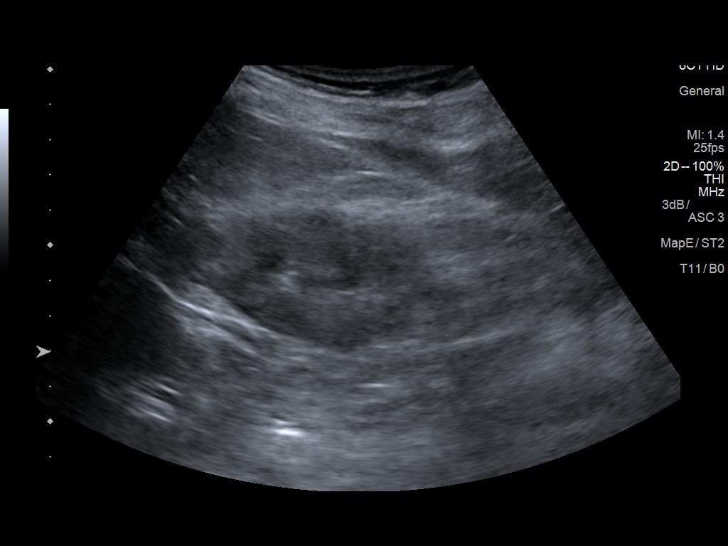
[im 3/14]
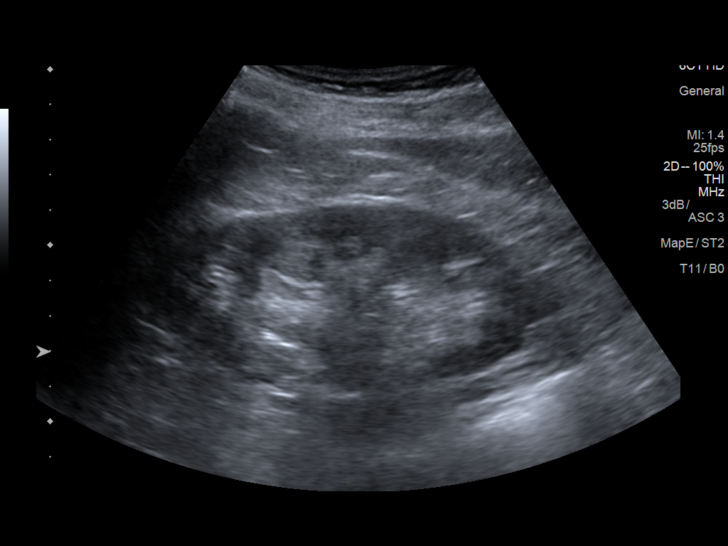
[im 4/14]
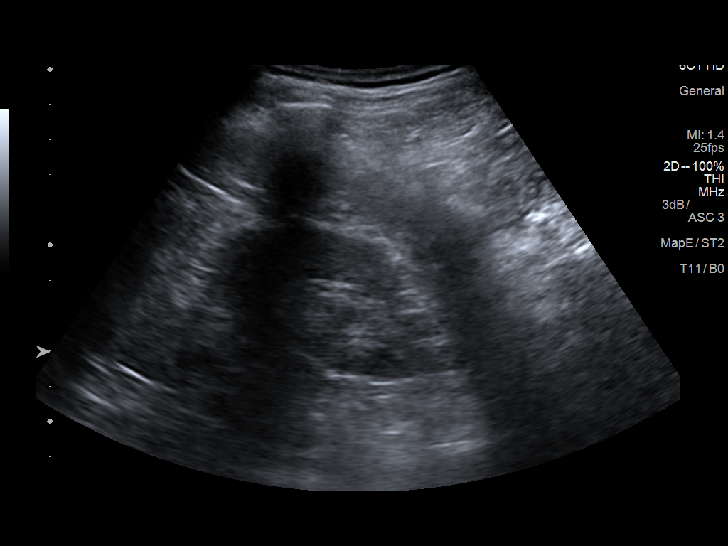
[im 5/14]
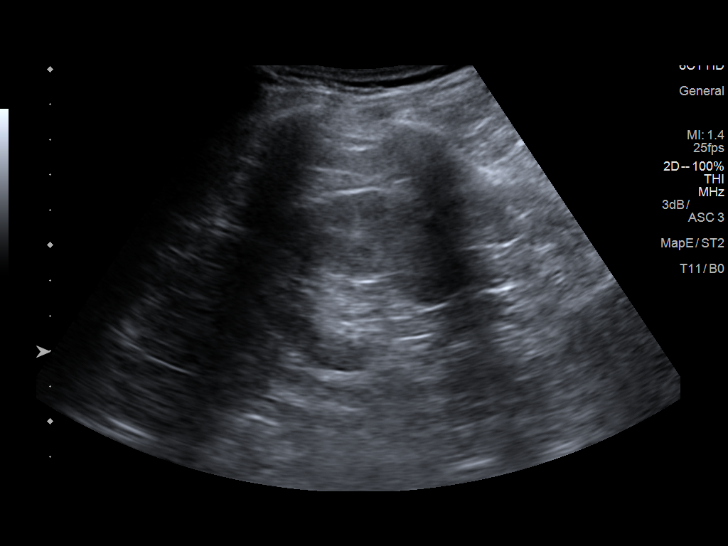
[im 6/14]
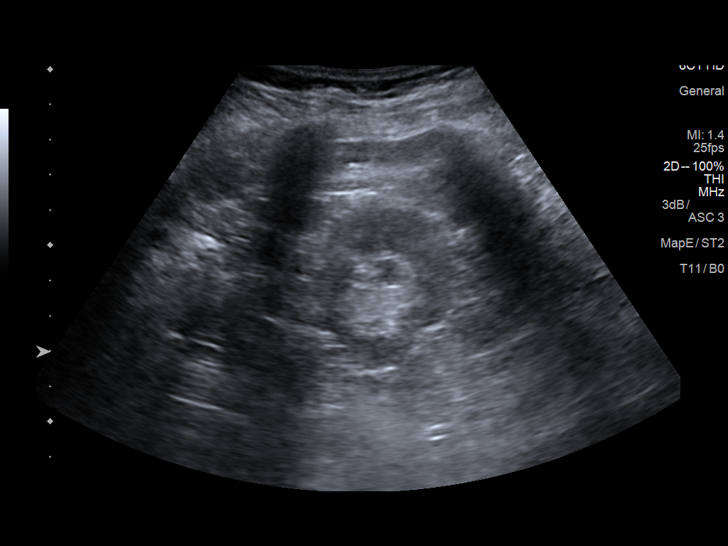
[im 8/14]
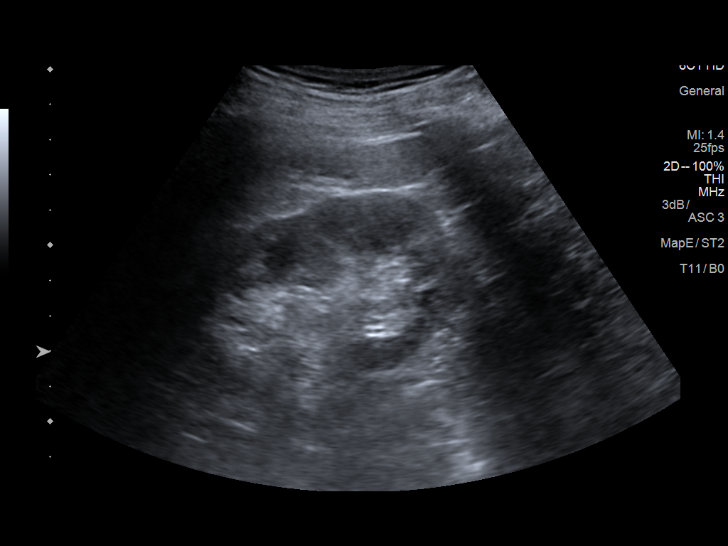
[im 9/14]
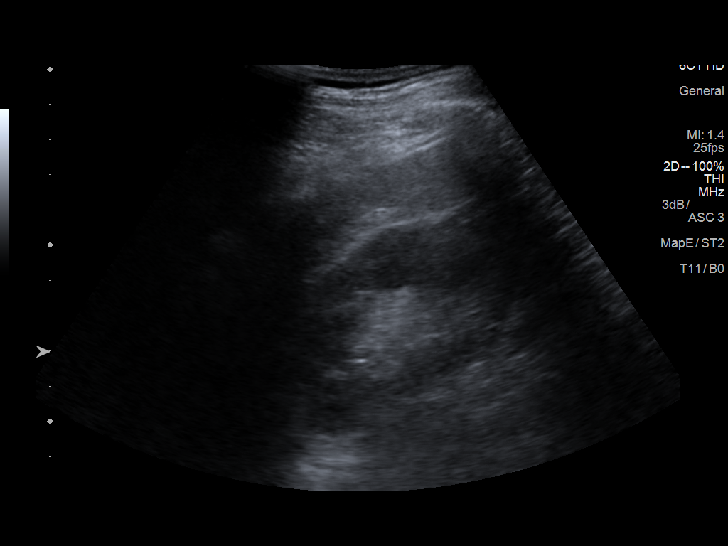
[im 10/14]
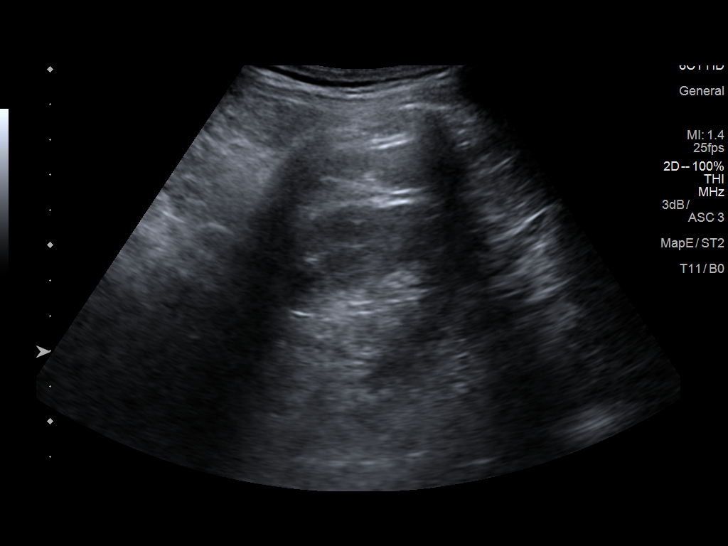
[im 11/14]
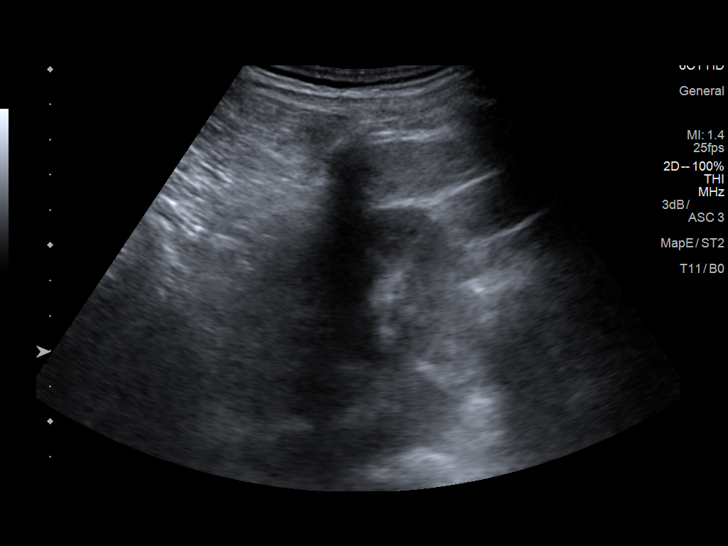
[im 12/14]
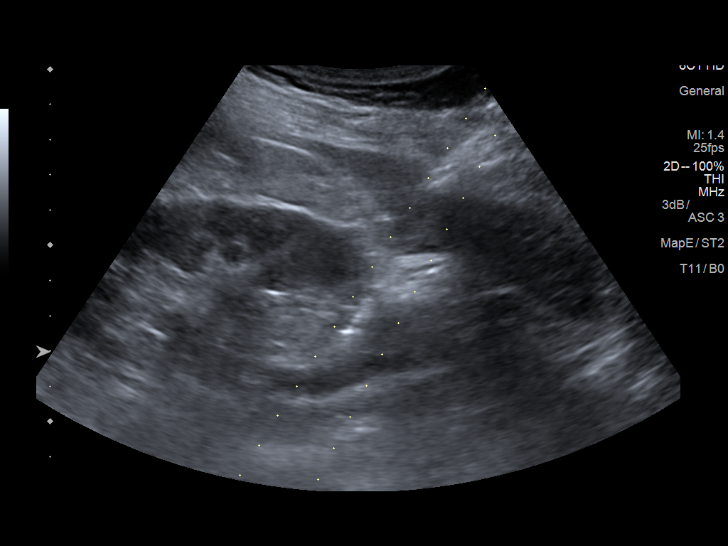
[im 13/14]
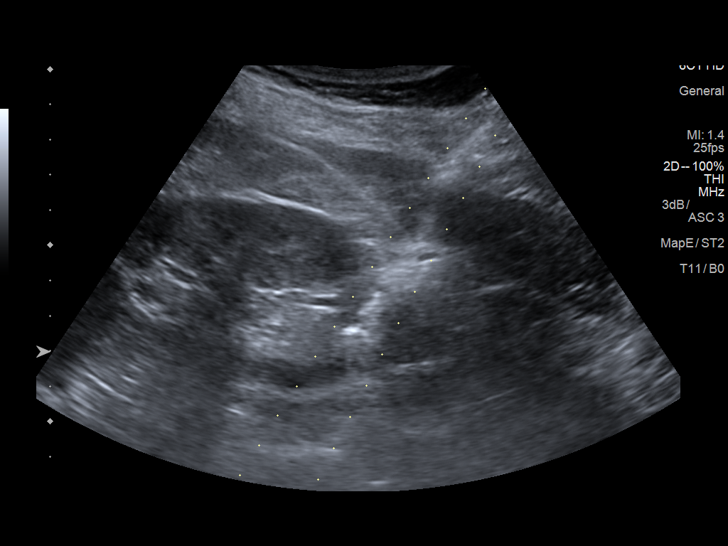
[im 14/14]
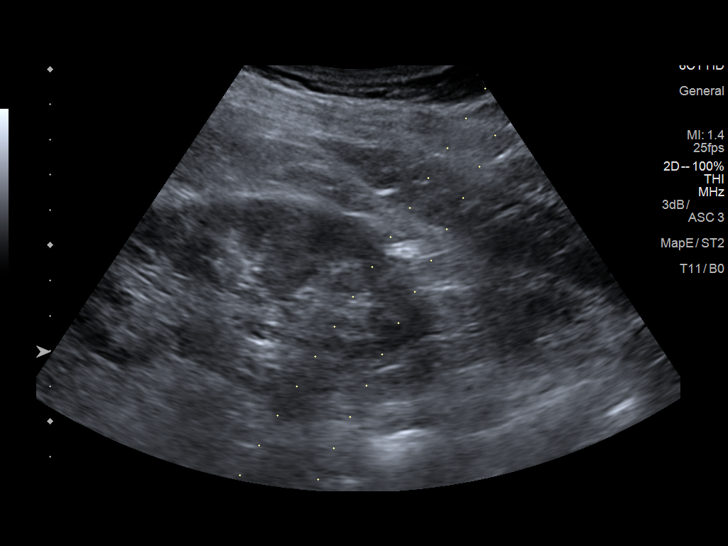

[13 of 14 positions shown; findings below may reference images not displayed]

EXAM:
ULTRASOUND BIOPSY CORE LIVER

MEDICATIONS:
1% lidocaine locally

ANESTHESIA/SEDATION:
Moderate (conscious) sedation was employed during this procedure. A
total of Versed 1.0 mg and Fentanyl 50 mcg was administered
intravenously.

Moderate Sedation Time: 10 minutes. The patient's level of
consciousness and vital signs were monitored continuously by
radiology nursing throughout the procedure under my direct
supervision.

FLUOROSCOPY TIME:  Fluoroscopy Time: None.

COMPLICATIONS:
None immediate.

PROCEDURE:
Informed written consent was obtained from the patient after a
thorough discussion of the procedural risks, benefits and
alternatives. All questions were addressed. Maximal Sterile Barrier
Technique was utilized including caps, mask, sterile gowns, sterile
gloves, sterile drape, hand hygiene and skin antiseptic. A timeout
was performed prior to the initiation of the procedure.

Previous imaging reviewed. Patient positioned prone. Preliminary
ultrasound performed of the kidneys. No hydronephrosis on either
side. Left kidney lower pole was localized for biopsy.

Under sterile conditions and local anesthesia, a 15 gauge coaxial
guide needle was advanced to the left kidney lower pole under direct
ultrasound. Through the access, 2 16 gauge core biopsies were
obtained under direct ultrasound. Needle position confirmed with
ultrasound. Images obtained for documentation. Samples were intact
and non fragmented. These were placed in saline. Needle tract
embolized with Gel-Foam. No immediate complication. Postprocedure
imaging demonstrates no hemorrhage, hematoma, or hydronephrosis.
IMPRESSION: Successful ultrasound left renal core biopsy

## 2015-10-11 IMAGING — US US RENAL
1 series · 14 of 25 positions shown · non-contrast
Comparison: CT scan of [DATE].

CLINICAL DATA: Chronic kidney disease stage 3.

EXAM:
RENAL / URINARY TRACT ULTRASOUND COMPLETE

[Series 1: us renal · 0.25mm/px · 14 of 47 slices shown]
[im 1/47]
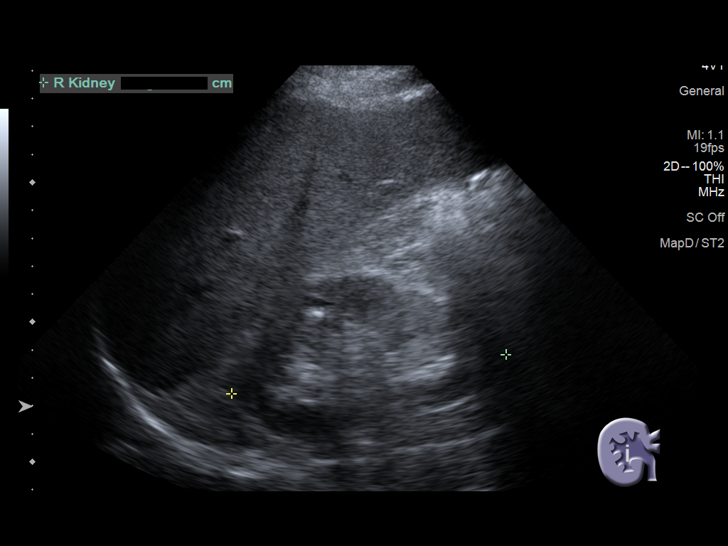
[im 4/47]
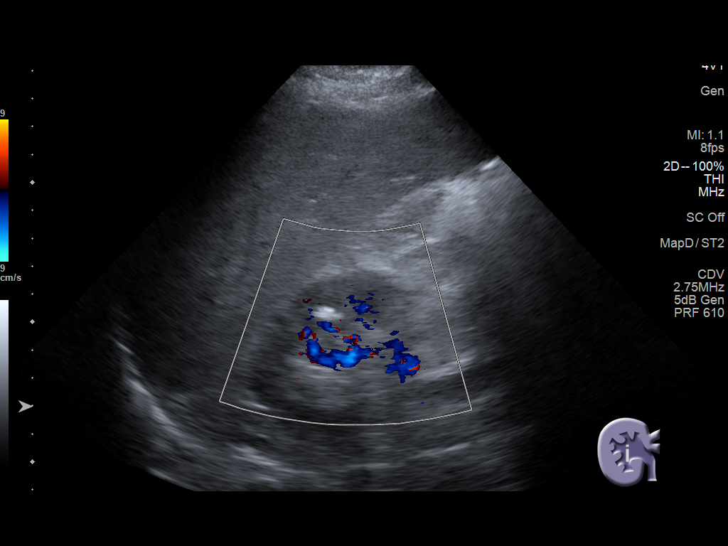
[im 8/47]
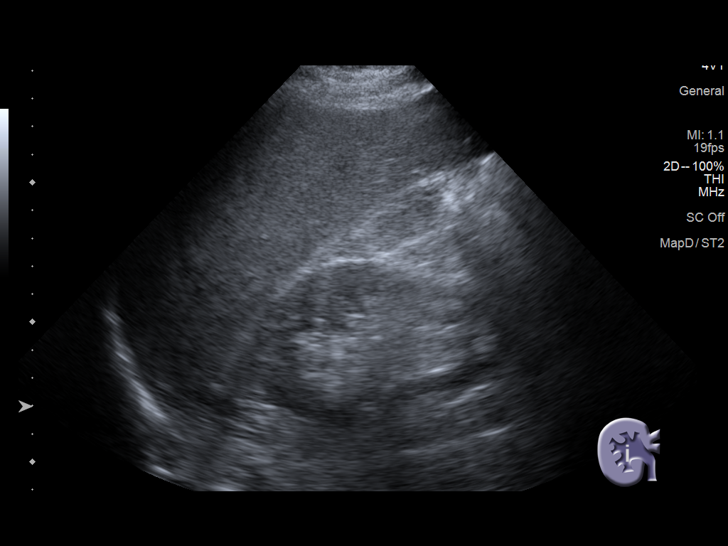
[im 12/47]
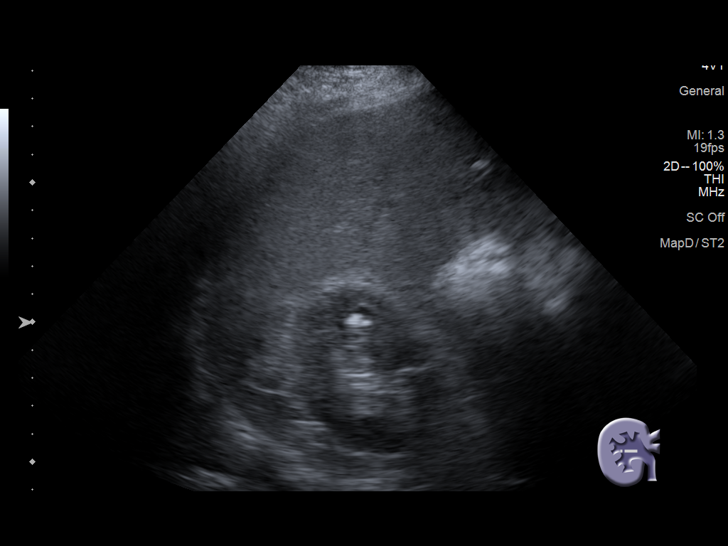
[im 16/47]
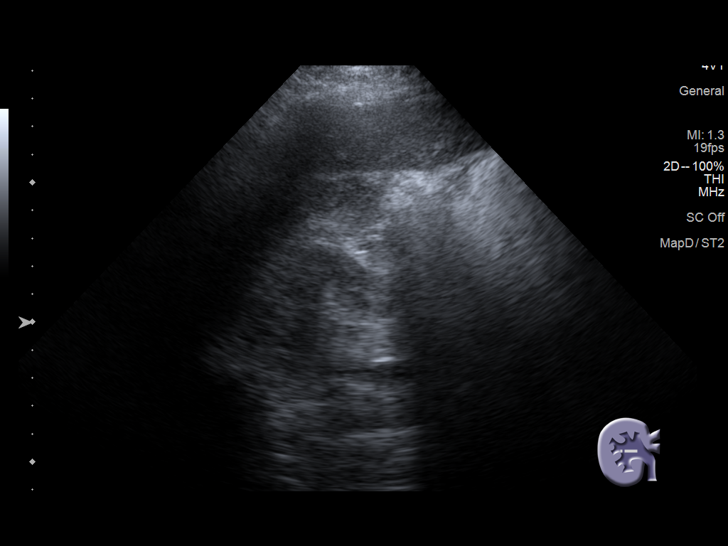
[im 18/47]
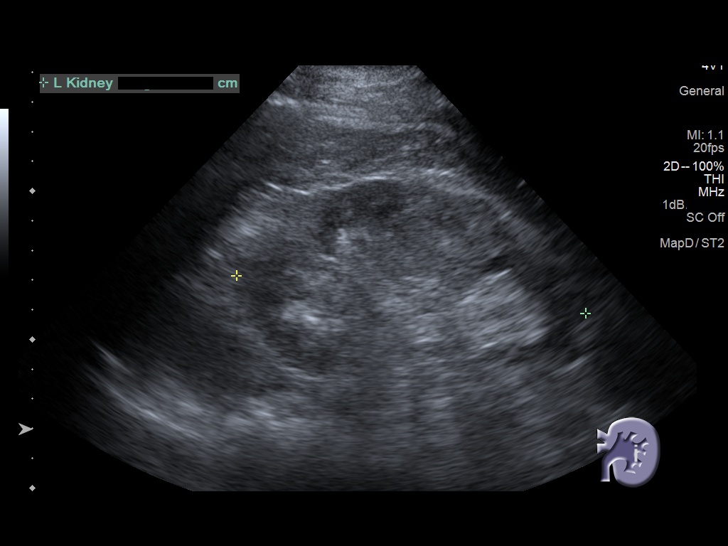
[im 22/47]
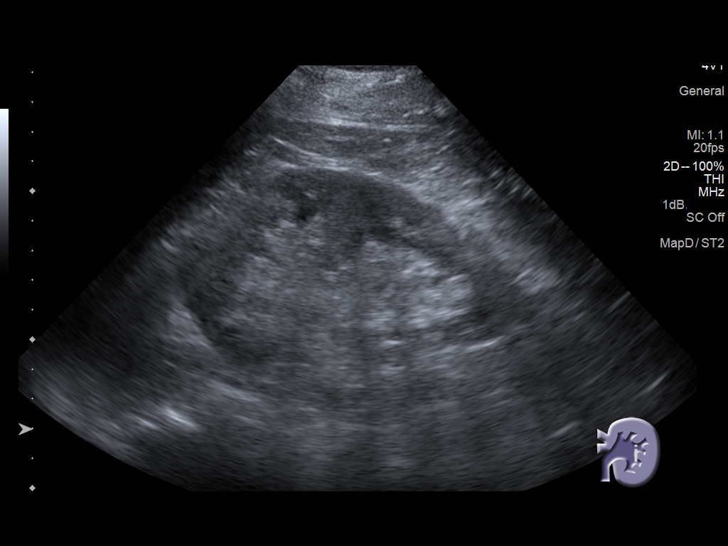
[im 25/47]
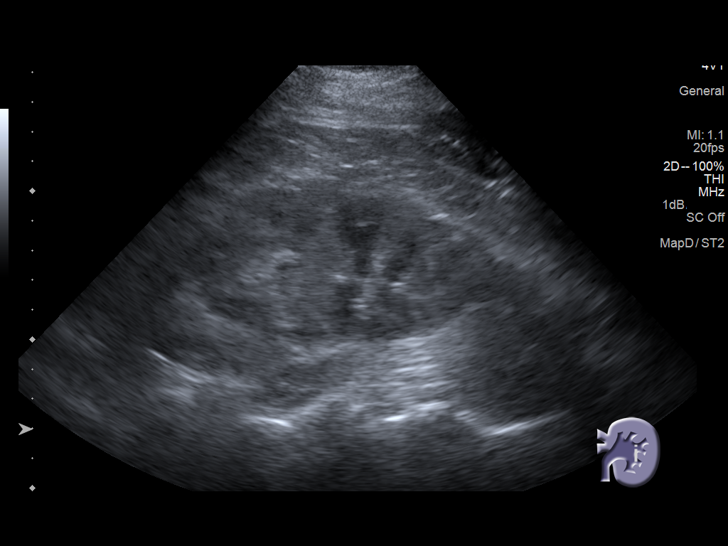
[im 29/47]
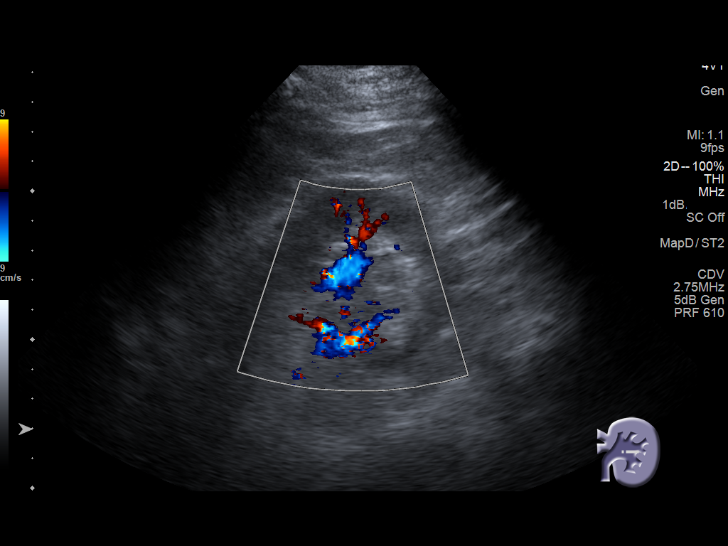
[im 31/47]
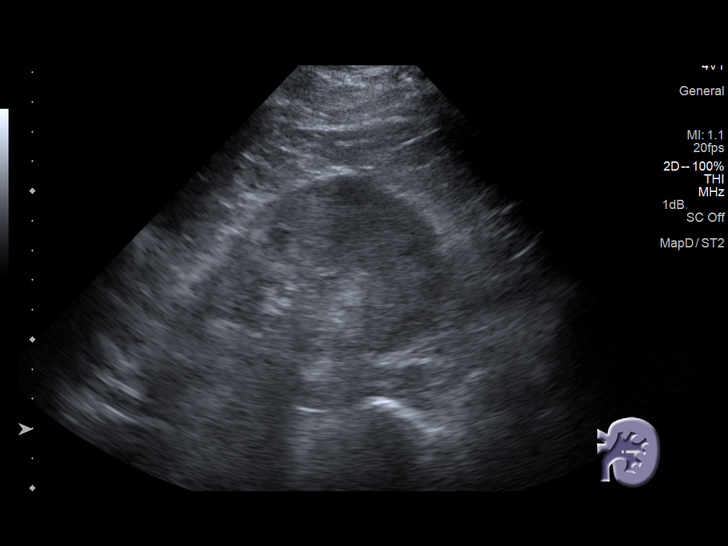
[im 35/47]
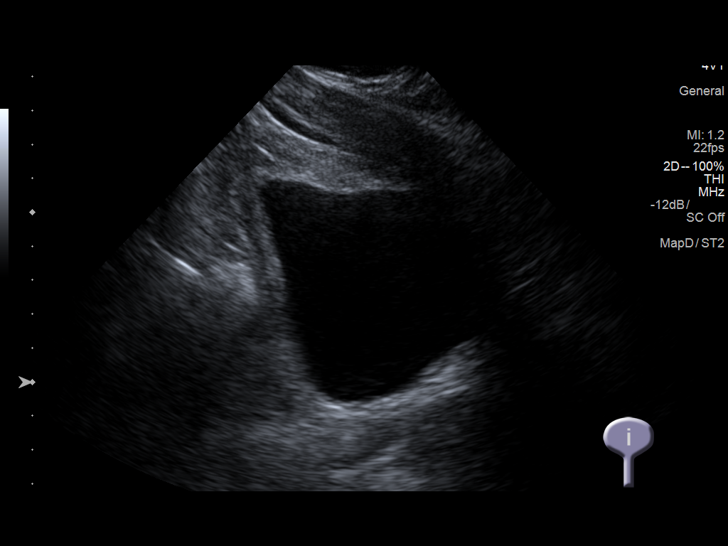
[im 39/47]
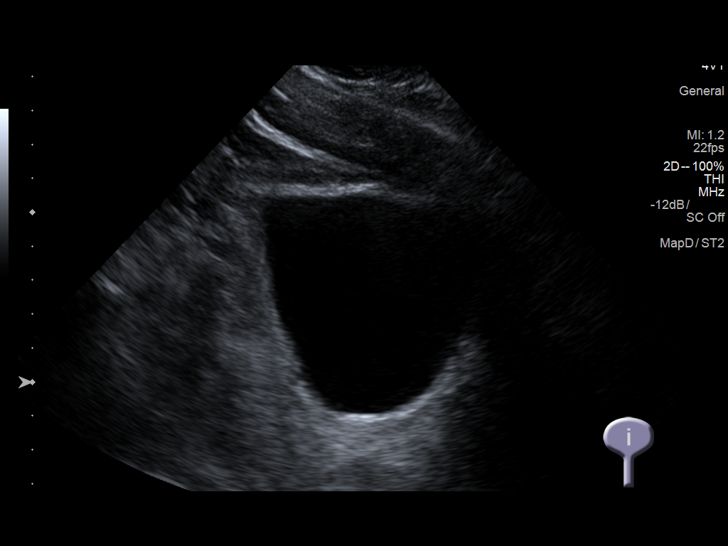
[im 43/47]
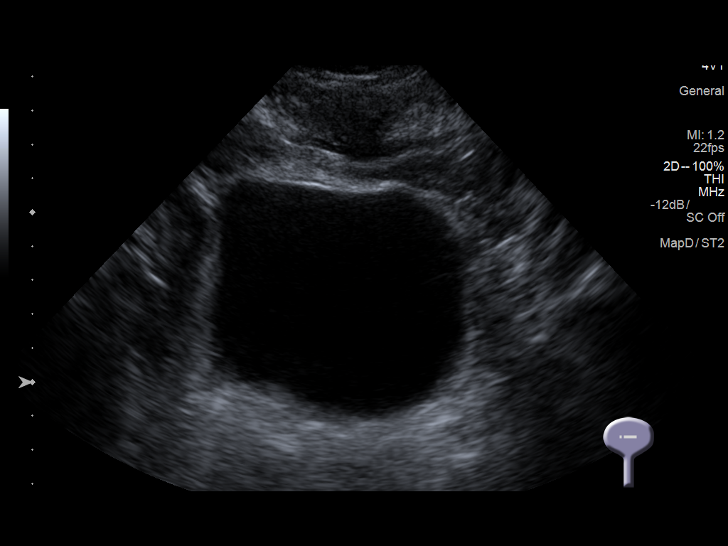
[im 47/47]
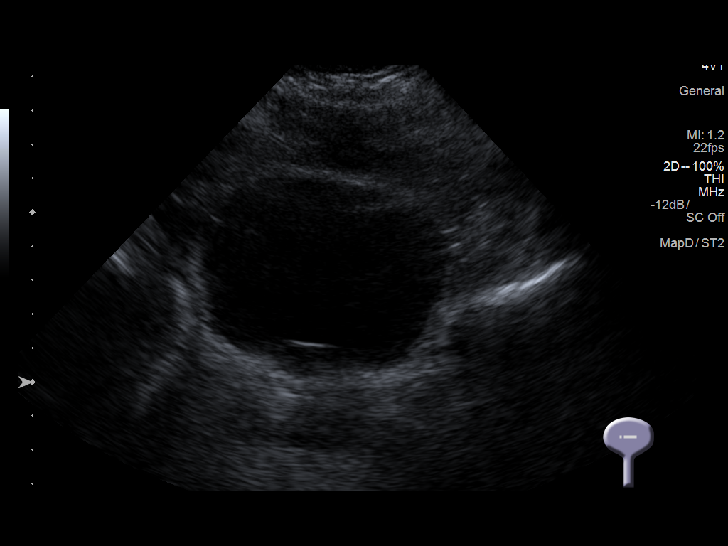

[14 of 25 positions shown; findings below may reference images not displayed]

FINDINGS: Right Kidney:

Length: 9.9 cm. 9 mm hyperechoic focus is noted in the cortex of
upper pole of right kidney which is non shadowing ; it is felt this
most likely represents fat within a calyx as it appears to be
contiguous with pelvic fat, although angiomyolipoma cannot be
excluded; calculus is unlikely as it was not present on prior
studies. Echogenicity within normal limits. No mass or
hydronephrosis visualized.

Left Kidney:

Length: 11.8 cm. Echogenicity within normal limits. No mass or
hydronephrosis visualized.

Bladder:

Appears normal for degree of bladder distention.
IMPRESSION: No hydronephrosis or renal obstruction is noted. 9 mm hyperechoic
focus is noted in upper pole of right kidney which most likely
represents fat within a upper pole calyx, although angiomyolipoma
cannot be excluded. Focal hemorrhage cannot be excluded given the
history of recent renal biopsy, but that is felt to be unlikely
since this is in the upper pole and biopsy was in lower pole.

## 2015-11-12 DIAGNOSIS — Z6829 Body mass index (BMI) 29.0-29.9, adult: Secondary | ICD-10-CM | POA: Diagnosis not present

## 2015-11-12 DIAGNOSIS — N183 Chronic kidney disease, stage 3 (moderate): Secondary | ICD-10-CM | POA: Diagnosis not present

## 2015-11-12 DIAGNOSIS — N028 Recurrent and persistent hematuria with other morphologic changes: Secondary | ICD-10-CM | POA: Diagnosis not present

## 2015-11-12 DIAGNOSIS — I10 Essential (primary) hypertension: Secondary | ICD-10-CM | POA: Diagnosis not present

## 2015-11-26 MED FILL — XARELTO 20 MG TABLET: 20 | 30 days supply | Qty: 30 | Fill #2

## 2015-12-24 ENCOUNTER — Emergency Department (HOSPITAL_COMMUNITY)
Admission: EM | Admit: 2015-12-24 | Discharge: 2015-12-24 | Disposition: A | Discharge status: Home / Self Care | Payer: BLUE CROSS/BLUE SHIELD | Attending: Emergency Medicine | Admitting: Emergency Medicine

## 2015-12-24 ENCOUNTER — Encounter (HOSPITAL_COMMUNITY): Payer: Self-pay | Admitting: Emergency Medicine

## 2015-12-24 ENCOUNTER — Emergency Department (HOSPITAL_COMMUNITY): Payer: BLUE CROSS/BLUE SHIELD

## 2015-12-24 DIAGNOSIS — K5732 Diverticulitis of large intestine without perforation or abscess without bleeding: Secondary | ICD-10-CM | POA: Diagnosis not present

## 2015-12-24 DIAGNOSIS — Z87891 Personal history of nicotine dependence: Secondary | ICD-10-CM | POA: Insufficient documentation

## 2015-12-24 DIAGNOSIS — R1030 Lower abdominal pain, unspecified: Secondary | ICD-10-CM | POA: Diagnosis not present

## 2015-12-24 DIAGNOSIS — I5020 Unspecified systolic (congestive) heart failure: Secondary | ICD-10-CM | POA: Insufficient documentation

## 2015-12-24 DIAGNOSIS — I2510 Atherosclerotic heart disease of native coronary artery without angina pectoris: Secondary | ICD-10-CM | POA: Diagnosis not present

## 2015-12-24 DIAGNOSIS — Z79899 Other long term (current) drug therapy: Secondary | ICD-10-CM | POA: Insufficient documentation

## 2015-12-24 DIAGNOSIS — N189 Chronic kidney disease, unspecified: Secondary | ICD-10-CM | POA: Diagnosis not present

## 2015-12-24 DIAGNOSIS — Z7901 Long term (current) use of anticoagulants: Secondary | ICD-10-CM | POA: Insufficient documentation

## 2015-12-24 DIAGNOSIS — R1032 Left lower quadrant pain: Secondary | ICD-10-CM | POA: Diagnosis not present

## 2015-12-24 LAB — URINALYSIS, ROUTINE W REFLEX MICROSCOPIC
BILIRUBIN URINE: NEGATIVE
GLUCOSE, UA: NEGATIVE mg/dL
KETONES UR: NEGATIVE mg/dL
Nitrite: NEGATIVE
PH: 5 (ref 5.0–8.0)
PROTEIN: 100 mg/dL — AB
Specific Gravity, Urine: 1.025 (ref 1.005–1.030)

## 2015-12-24 LAB — CBC WITH DIFFERENTIAL/PLATELET
BASOS ABS: 0 10*3/uL (ref 0.0–0.1)
BASOS PCT: 0 %
EOS PCT: 1 %
Eosinophils Absolute: 0.1 10*3/uL (ref 0.0–0.7)
HCT: 32.8 % — ABNORMAL LOW (ref 39.0–52.0)
Hemoglobin: 11.5 g/dL — ABNORMAL LOW (ref 13.0–17.0)
Lymphocytes Relative: 7 %
Lymphs Abs: 1.1 10*3/uL (ref 0.7–4.0)
MCH: 31.2 pg (ref 26.0–34.0)
MCHC: 35.1 g/dL (ref 30.0–36.0)
MCV: 88.9 fL (ref 78.0–100.0)
MONO ABS: 1.2 10*3/uL — AB (ref 0.1–1.0)
MONOS PCT: 7 %
Neutro Abs: 13.6 10*3/uL — ABNORMAL HIGH (ref 1.7–7.7)
Neutrophils Relative %: 85 %
PLATELETS: 216 10*3/uL (ref 150–400)
RBC: 3.69 MIL/uL — ABNORMAL LOW (ref 4.22–5.81)
RDW: 12.8 % (ref 11.5–15.5)
WBC: 15.9 10*3/uL — ABNORMAL HIGH (ref 4.0–10.5)

## 2015-12-24 LAB — BASIC METABOLIC PANEL
ANION GAP: 8 (ref 5–15)
BUN: 22 mg/dL — ABNORMAL HIGH (ref 6–20)
CALCIUM: 9.5 mg/dL (ref 8.9–10.3)
CO2: 23 mmol/L (ref 22–32)
CREATININE: 1.86 mg/dL — AB (ref 0.61–1.24)
Chloride: 106 mmol/L (ref 101–111)
GFR, EST AFRICAN AMERICAN: 48 mL/min — AB (ref >60)
GFR, EST NON AFRICAN AMERICAN: 41 mL/min — AB (ref >60)
GLUCOSE: 100 mg/dL — AB (ref 65–99)
Potassium: 3.8 mmol/L (ref 3.5–5.1)
Sodium: 137 mmol/L (ref 135–145)

## 2015-12-24 LAB — HEPATIC FUNCTION PANEL
ALT: 17 U/L (ref 17–63)
AST: 19 U/L (ref 15–41)
Albumin: 4.3 g/dL (ref 3.5–5.0)
Alkaline Phosphatase: 45 U/L (ref 38–126)
BILIRUBIN DIRECT: 0.2 mg/dL (ref 0.1–0.5)
BILIRUBIN INDIRECT: 1 mg/dL — AB (ref 0.3–0.9)
Total Bilirubin: 1.2 mg/dL (ref 0.3–1.2)
Total Protein: 7.5 g/dL (ref 6.5–8.1)

## 2015-12-24 LAB — URINE MICROSCOPIC-ADD ON

## 2015-12-24 IMAGING — CT CT ABD-PELV W/O CM
2 of 4 series · 16 of 46 positions shown, 18 images · non-contrast
Comparison: CT abdomen pelvis [DATE].

CLINICAL DATA: Patient with bilateral lower abdominal pain.

EXAM:
CT ABDOMEN AND PELVIS WITHOUT CONTRAST
TECHNIQUE: Multidetector CT imaging of the abdomen and pelvis was performed
following the standard protocol without IV contrast.

[Series 2: axial st · axial · 0.95mm/px · z∈[-507,-42]mm · 13 of 103 slices shown, 15 images]
[im 5/103  soft-tissue]
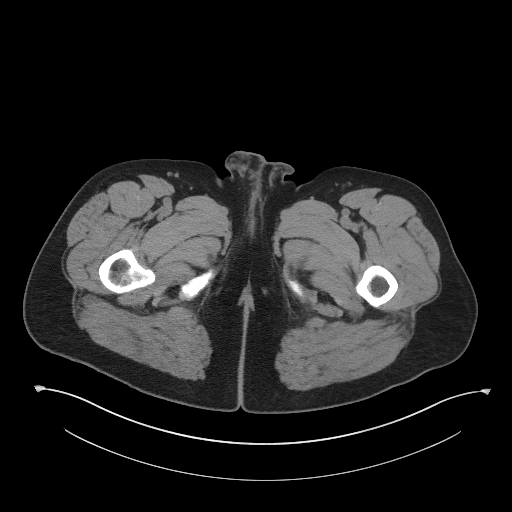
[im 5/103  bone]
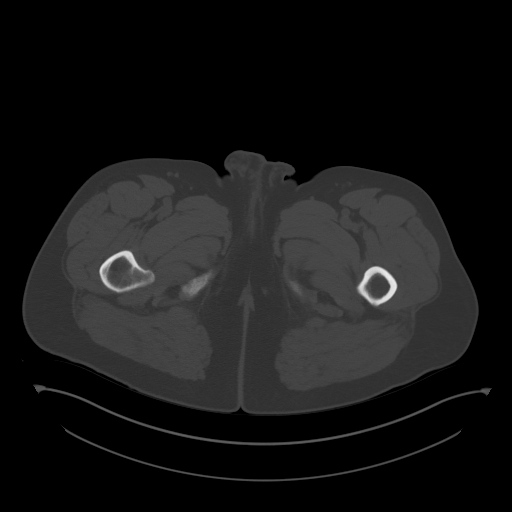
[im 14/103  soft-tissue]
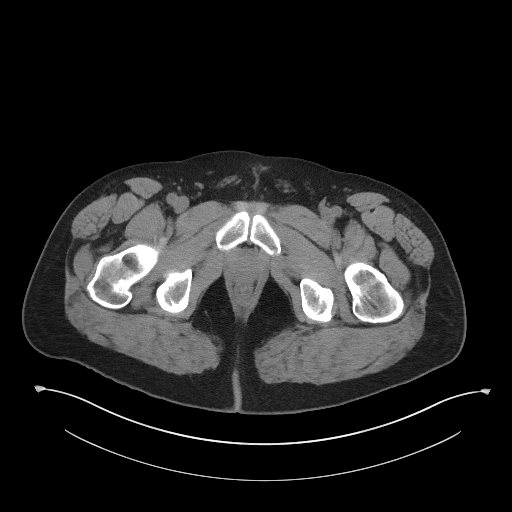
[im 23/103  soft-tissue]
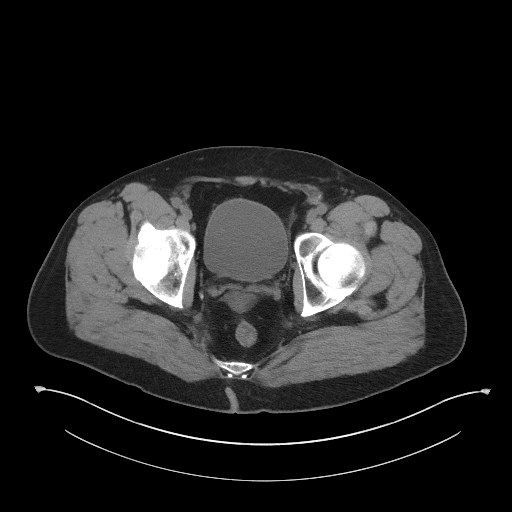
[im 27/103  soft-tissue]
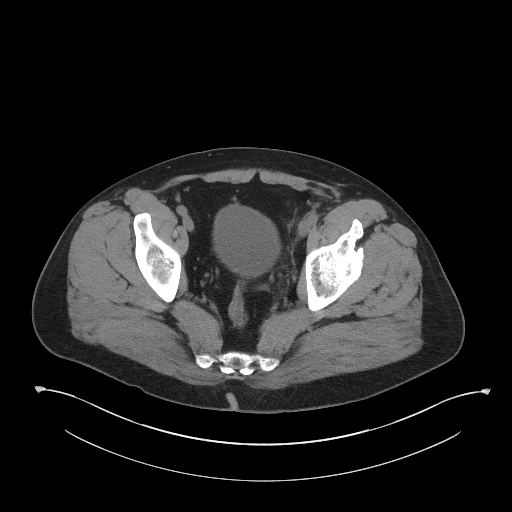
[im 36/103  soft-tissue]
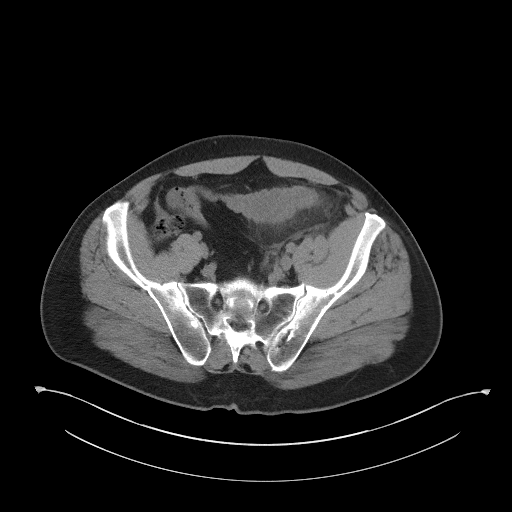
[im 45/103  soft-tissue]
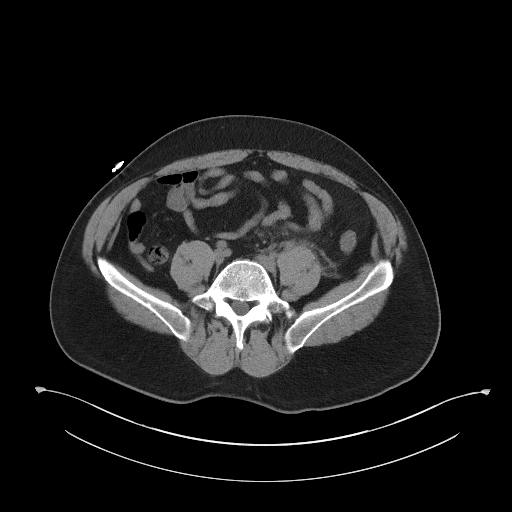
[im 54/103  soft-tissue]
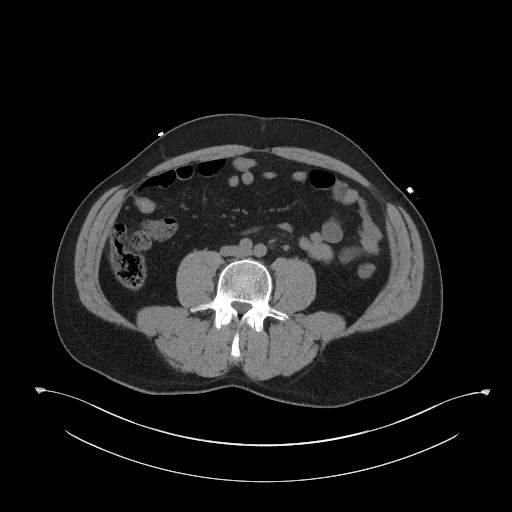
[im 58/103  soft-tissue]
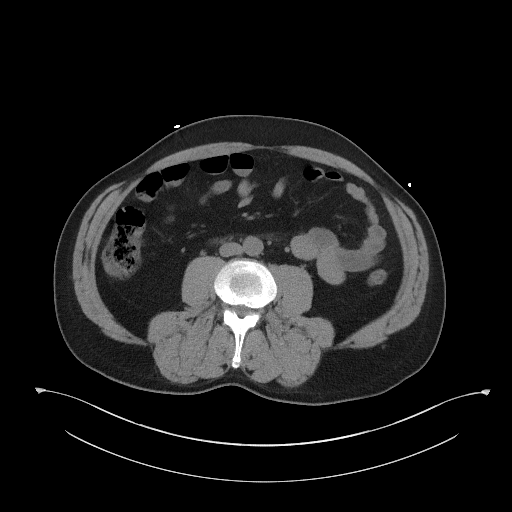
[im 67/103  soft-tissue]
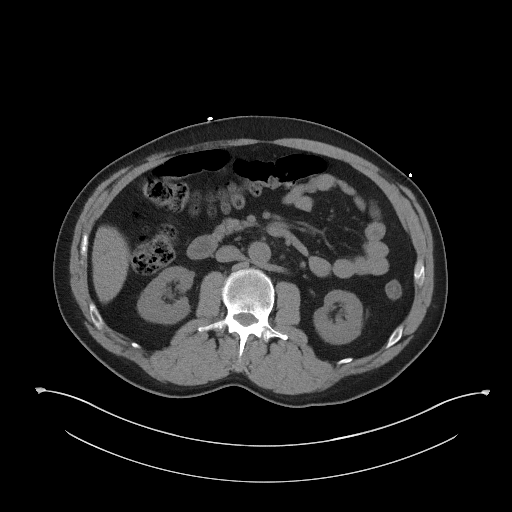
[im 67/103  bone]
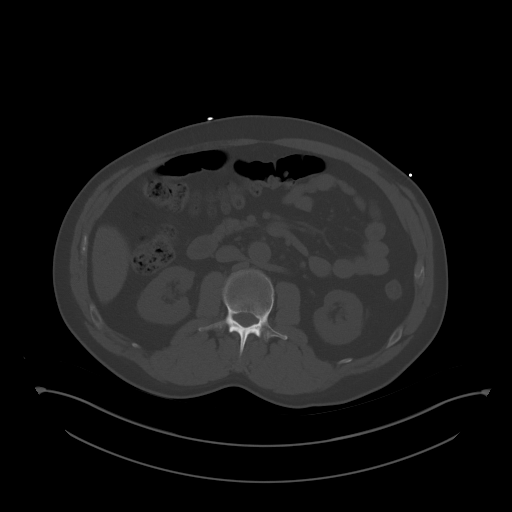
[im 76/103  soft-tissue]
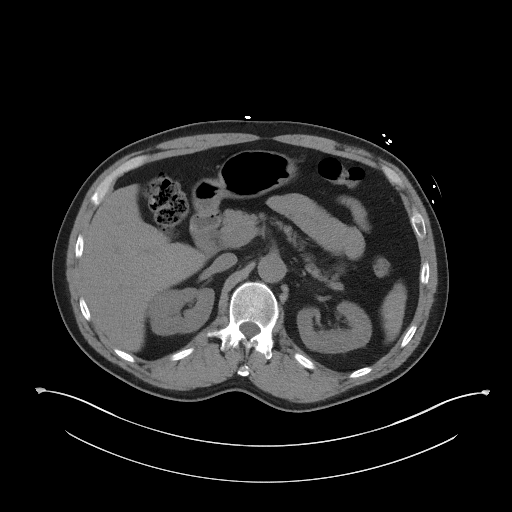
[im 80/103  soft-tissue]
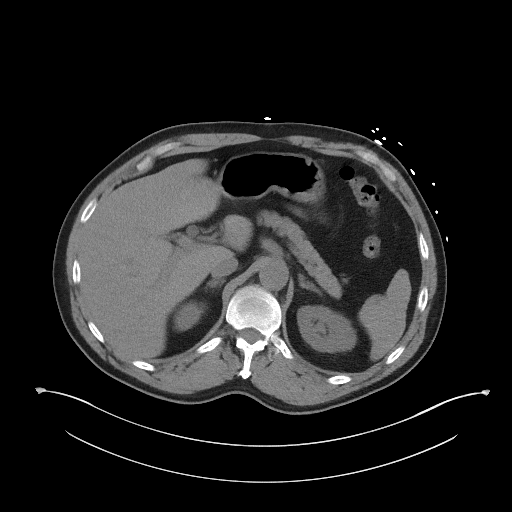
[im 89/103  soft-tissue]
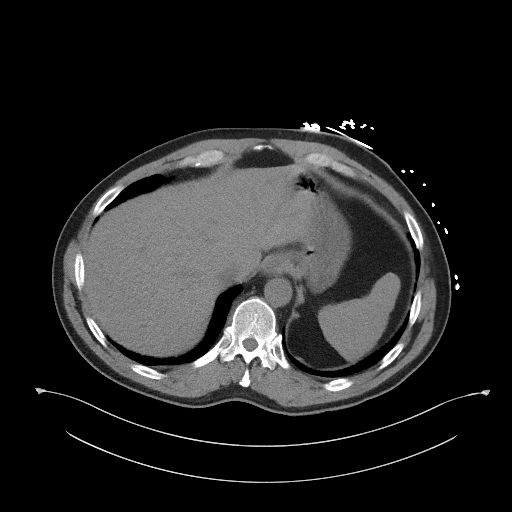
[im 98/103  soft-tissue]
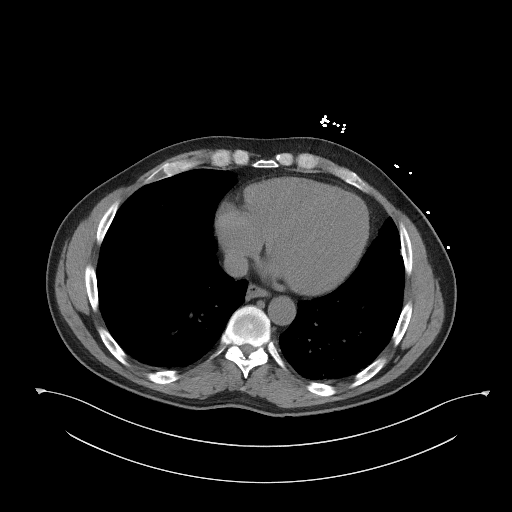

[Series 4: coronal st · coronal · 0.86mm/px · 3 of 101 slices shown]
[im 34/101  soft-tissue]
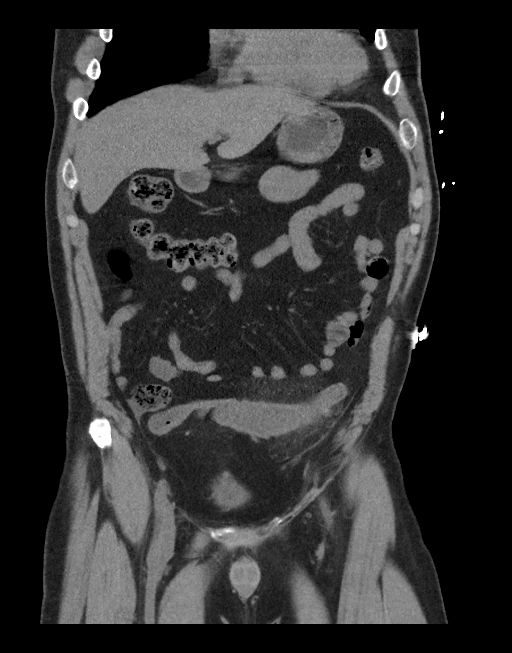
[im 45/101  soft-tissue]
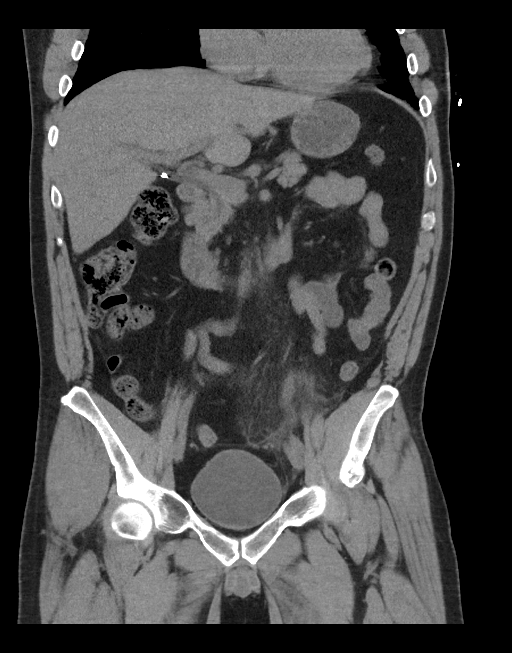
[im 56/101  soft-tissue]
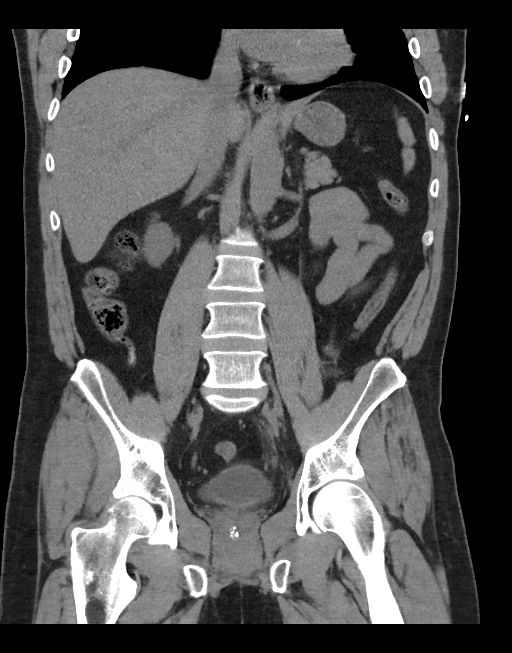

[16 of 46 positions shown; findings below may reference images not displayed]

FINDINGS: Lower chest: Normal heart size. Dependent atelectasis within the
bilateral lower lobes. No pleural effusion. Unchanged 6 mm nodule
left lower lobe.

Hepatobiliary: The liver is diffusely low in attenuation. Patient
status post cholecystectomy.

Pancreas: Unremarkable

Spleen: Unremarkable

Adrenals/Urinary Tract: The adrenal glands are normal. Kidneys are
symmetric in size. No hydronephrosis. Urinary bladder is
unremarkable.

Stomach/Bowel: Circumferential wall thickening of the sigmoid colon
(image 68; series 2). There is marked surrounding fat stranding as
well as small amount of fluid within the left paracolic gutter. The
appendix is normal. No free intraperitoneal air. Small amount of
free fluid in the pelvis. Normal morphology of the stomach. Small
hiatal hernia.

Vascular/Lymphatic: Normal caliber abdominal aorta. Retro aortic
left renal vein. No retroperitoneal lymphadenopathy.

Reproductive: Central dystrophic calcifications in the prostate.

Other: None.

Musculoskeletal: No aggressive or acute appearing osseous lesions.
Lumbar spine degenerative changes.
IMPRESSION: Circumferential wall thickening of the sigmoid colon with associated
surrounding fat stranding compatible with focal
colitis/diverticulitis. Upon resolution of the acute symptomatology,
recommend correlation with colonoscopy if not previously performed
to exclude the possibility of underlying colonic mass.

Hepatic steatosis.

## 2015-12-24 MED ORDER — DEXTROSE 5 % IV SOLN
1.0000 g | Freq: Once | INTRAVENOUS | Status: AC
  Administered 2015-12-24: 1 g via INTRAVENOUS
  Filled 2015-12-24: qty 10

## 2015-12-24 MED ORDER — OXYCODONE-ACETAMINOPHEN 5-325 MG PO TABS: 1.0000 | ORAL_TABLET | Freq: Four times a day (QID) | ORAL | 0 refills | Status: DC | PRN

## 2015-12-24 MED ORDER — ONDANSETRON 4 MG PO TBDP: ORAL_TABLET | ORAL | 0 refills | Status: DC

## 2015-12-24 MED ORDER — CIPROFLOXACIN HCL 500 MG PO TABS: ORAL_TABLET | ORAL | 0 refills | Status: DC

## 2015-12-24 MED ORDER — ONDANSETRON HCL 4 MG/2ML IJ SOLN
4.0000 mg | Freq: Once | INTRAMUSCULAR | Status: AC
  Administered 2015-12-24: 4 mg via INTRAVENOUS
  Filled 2015-12-24: qty 2

## 2015-12-24 MED ORDER — HYDROMORPHONE HCL 1 MG/ML IJ SOLN
0.5000 mg | Freq: Once | INTRAMUSCULAR | Status: AC
  Administered 2015-12-24: 0.5 mg via INTRAVENOUS
  Filled 2015-12-24: qty 1

## 2015-12-24 MED ORDER — HYDROMORPHONE HCL 1 MG/ML IJ SOLN
1.0000 mg | Freq: Once | INTRAMUSCULAR | Status: AC
  Administered 2015-12-24: 1 mg via INTRAVENOUS
  Filled 2015-12-24: qty 1

## 2015-12-24 MED ORDER — METRONIDAZOLE 500 MG PO TABS: ORAL_TABLET | ORAL | 0 refills | Status: DC

## 2015-12-24 NOTE — ED Provider Notes (Signed)
[facility] DEPT Provider Note   CSN: [id number] Arrival date & time: 12/24/15  1237     History   Chief Complaint Chief Complaint  Patient presents with  . Groin Pain    HPI ACKEEM BAILIN is a 45 y.o. male.  Patient complaining of left lower quadrant abdominal pain   The history is provided by the patient. No language interpreter was used.  Groin Pain  This is a new problem. The current episode started 2 days ago. The problem occurs constantly. The problem has not changed since onset.Associated symptoms include abdominal pain. Pertinent negatives include no chest pain and no headaches. Nothing aggravates the symptoms. Nothing relieves the symptoms.    Past Medical History:  Diagnosis Date  . Anxiety   . Atrial fibrillation ([place])    a. s/p RFCA x 2 (10/26/2011, 04/15/2012)  . Atrial flutter ([place])   . Chronic kidney disease   . Current smoker   . 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 LA thrombus   . Non-ischemic cardiomyopathy ([place])   . OSA on CPAP   . Sinus trouble   . Sleep apnea   . Systolic heart failure ([facility])    EF 20-25%-> 50-55% in 10/2011. MOST RECENT ECHO (03/2012) EF= 66%    Patient Active Problem List   Diagnosis Date Noted  . Tobacco abuse 09/02/2012  . Hyperthyroidism 09/02/2012  . Palpitations 07/09/2012  . Atrial fibrillation ([place]) 08/21/2011  . 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 Atrial flutter ([place]) 08/21/2011  . Obstructive sleep apnea 08/21/2011  . FH: coronary artery disease 08/21/2011    Past Surgical History:  Procedure Laterality Date  . ATRIAL ABLATION SURGERY    . CARDIAC CATHETERIZATION  08/2011   [facility]  . CHOLECYSTECTOMY    . FINGER SURGERY    . LEFT HEART CATHETERIZATION WITH CORONARY ANGIOGRAM N/A 09/12/2011   Procedure: LEFT HEART CATHETERIZATION WITH CORONARY ANGIOGRAM;  Surgeon: Hillary Bow, MD;  Location: [facility] CATH LAB;  Service: Cardiovascular;   Laterality: N/A;  . VASECTOMY         Home Medications    Prior to Admission medications   Medication Sig Start Date End Date Taking? Authorizing Provider  losartan (COZAAR) 50 MG tablet Take 50 mg by mouth daily. 12/09/15  Yes Historical Provider, MD  Rivaroxaban (XARELTO) 20 MG TABS Take 1 tablet by mouth daily.   Yes Historical Provider, MD  ciprofloxacin (CIPRO) 500 MG tablet One po bid 12/24/15   Milton Ferguson, MD  metroNIDAZOLE (FLAGYL) 500 MG tablet One po qid 12/24/15   Milton Ferguson, MD  ondansetron (ZOFRAN ODT) 4 MG disintegrating tablet 4mg  ODT q4 hours prn nausea/vomit 12/24/15   Milton Ferguson, MD  oxyCODONE-acetaminophen (PERCOCET/ROXICET) 5-325 MG tablet Take 1 tablet by mouth every 6 (six) hours as needed. 12/24/15   Milton Ferguson, MD    Family History Family History  Problem Relation Age of Onset  . Atrial fibrillation Father     age 47  . Cancer Father     melanoma  . Healthy Mother     age 9  . Cancer Mother     melanoma  . Coronary artery disease Paternal Grandmother   . Heart disease Paternal Grandmother   . Heart attack Paternal Grandmother   . Coronary artery disease Paternal Grandfather   . Heart disease Paternal Grandfather   . Heart attack Paternal Grandfather   . Atrial fibrillation Paternal Aunt   .  Atrial fibrillation Paternal Uncle     Social History Social History  Substance Use Topics  . Smoking status: Former Smoker    Packs/day: 7.00    Years: 20.00    Types: Cigarettes  . Smokeless tobacco: Never Used  . Alcohol use Yes     Comment: drinks socially     Allergies   Amiodarone   Review of Systems Review of Systems  Constitutional: Negative for appetite change and fatigue.  HENT: Negative for congestion, ear discharge and sinus pressure.   Eyes: Negative for discharge.  Respiratory: Negative for cough.   Cardiovascular: Negative for chest pain.  Gastrointestinal: Positive for abdominal pain. Negative for diarrhea.    Genitourinary: Negative for frequency and hematuria.  Musculoskeletal: Negative for back pain.  Skin: Negative for rash.  Neurological: Negative for seizures and headaches.  Psychiatric/Behavioral: Negative for hallucinations.     Physical Exam Updated Vital Signs BP 133/86   Pulse 66   Temp 99 F (37.2 C) (Oral)   Resp 16   Ht 6\' 1"  (1.854 m)   Wt 215 lb (97.5 kg)   SpO2 100%   BMI 28.37 kg/m   Physical Exam  Constitutional: He is oriented to person, place, and time. He appears well-developed.  HENT:  Head: Normocephalic.  Eyes: Conjunctivae and EOM are normal. No scleral icterus.  Neck: Neck supple. No thyromegaly present.  Cardiovascular: Normal rate and regular rhythm.  Exam reveals no gallop and no friction rub.   No murmur heard. Pulmonary/Chest: No stridor. He has no wheezes. He has no rales. He exhibits no tenderness.  Abdominal: He exhibits no distension. There is tenderness. There is no rebound.  Tender llq  Musculoskeletal: Normal range of motion. He exhibits no edema.  Lymphadenopathy:    He has no cervical adenopathy.  Neurological: He is oriented to person, place, and time. He exhibits normal muscle tone. Coordination normal.  Skin: No rash noted. No erythema.  Psychiatric: He has a normal mood and affect. His behavior is normal.     ED Treatments / Results  Labs (all labs ordered are listed, but only abnormal results are displayed) Labs Reviewed  CBC WITH DIFFERENTIAL/PLATELET - Abnormal; Notable for the following:       Result Value   WBC 15.9 (*)    RBC 3.69 (*)    Hemoglobin 11.5 (*)    HCT 32.8 (*)    Neutro Abs 13.6 (*)    Monocytes Absolute 1.2 (*)    All other components within normal limits  BASIC METABOLIC PANEL - Abnormal; Notable for the following:    Glucose, Bld 100 (*)    BUN 22 (*)    Creatinine, Ser 1.86 (*)    GFR calc non Af Amer 41 (*)    GFR calc Af Amer 48 (*)    All other components within normal limits  URINALYSIS,  ROUTINE W REFLEX MICROSCOPIC (NOT AT [facility]) - Abnormal; Notable for the following:    Color, Urine AMBER (*)    APPearance CLOUDY (*)    Hgb urine dipstick LARGE (*)    Protein, ur 100 (*)    Leukocytes, UA TRACE (*)    All other components within normal limits  HEPATIC FUNCTION PANEL - Abnormal; Notable for the following:    Indirect Bilirubin 1.0 (*)    All other components within normal limits  URINE MICROSCOPIC-ADD ON - Abnormal; Notable for the following:    Squamous Epithelial / LPF 0-5 (*)    Bacteria,  UA MANY (*)    Casts GRANULAR CAST (*)    All other components within normal limits  URINE CULTURE    EKG  EKG Interpretation  Date/Time:  Friday December 24 2015 13:34:23 EDT Ventricular Rate:  68 PR Interval:    QRS Duration: 105 QT Interval:  367 QTC Calculation: 391 R Axis:   -35 Text Interpretation:  Sinus or ectopic atrial rhythm Short PR interval Left axis deviation RSR' in V1 or V2, right VCD or RVH ST depr, consider ischemia, inferior leads Minimal ST elevation, lateral leads Confirmed by Daralyn Bert  MD, Lynnel Zanetti [phone]) on 12/24/2015 4:08:47 PM       Radiology Ct Abdomen Pelvis Wo Contrast  Result Date: 12/24/2015 CLINICAL DATA:  Patient with bilateral lower abdominal pain. EXAM: CT ABDOMEN AND PELVIS WITHOUT CONTRAST TECHNIQUE: Multidetector CT imaging of the abdomen and pelvis was performed following the standard protocol without IV contrast. COMPARISON:  CT abdomen pelvis 06/04/2015. FINDINGS: Lower chest: Normal heart size. Dependent atelectasis within the bilateral lower lobes. No pleural effusion. Unchanged 6 mm nodule left lower lobe. Hepatobiliary: The liver is diffusely low in attenuation. Patient status post cholecystectomy. Pancreas: Unremarkable Spleen: Unremarkable Adrenals/Urinary Tract: The adrenal glands are normal. Kidneys are symmetric in size. No hydronephrosis. Urinary bladder is unremarkable. Stomach/Bowel: Circumferential wall thickening of the  sigmoid colon (image 68; series 2). There is marked surrounding fat stranding as well as small amount of fluid within the left paracolic gutter. The appendix is normal. No free intraperitoneal air. Small amount of free fluid in the pelvis. Normal morphology of the stomach. Small hiatal hernia. Vascular/Lymphatic: Normal caliber abdominal aorta. Retro aortic left renal vein. No retroperitoneal lymphadenopathy. Reproductive: Central dystrophic calcifications in the prostate. Other: None. Musculoskeletal: No aggressive or acute appearing osseous lesions. Lumbar spine degenerative changes. IMPRESSION: Circumferential wall thickening of the sigmoid colon with associated surrounding fat stranding compatible with focal colitis/diverticulitis. Upon resolution of the acute symptomatology, recommend correlation with colonoscopy if not previously performed to exclude the possibility of underlying colonic mass. Hepatic steatosis. Electronically Signed   By: Lovey Newcomer M.D.   On: 12/24/2015 16:07    Procedures Procedures (including critical care time)  Medications Ordered in ED Medications  HYDROmorphone (DILAUDID) injection 0.5 mg (not administered)  HYDROmorphone (DILAUDID) injection 1 mg (1 mg Intravenous Given 12/24/15 1327)  ondansetron (ZOFRAN) injection 4 mg (4 mg Intravenous Given 12/24/15 1327)  cefTRIAXone (ROCEPHIN) 1 g in dextrose 5 % 50 mL IVPB (0 g Intravenous Stopped 12/24/15 1448)     Initial Impression / Assessment and Plan / ED Course  I have reviewed the triage vital signs and the nursing notes.  Pertinent labs & imaging results that were available during my care of the patient were reviewed by me and considered in my medical decision making (see chart for details).  Clinical Course    CT scan shows diverticulitis without perforation. Patient will be discharged with Cipro Flagyl pain medicines and nausea medicine and will follow-up with his PCP  Final Clinical Impressions(s) / ED  Diagnoses   Final diagnoses:  Lower abdominal pain    New Prescriptions New Prescriptions   CIPROFLOXACIN (CIPRO) 500 MG TABLET    One po bid   METRONIDAZOLE (FLAGYL) 500 MG TABLET    One po qid   ONDANSETRON (ZOFRAN ODT) 4 MG DISINTEGRATING TABLET    4mg  ODT q4 hours prn nausea/vomit   OXYCODONE-ACETAMINOPHEN (PERCOCET/ROXICET) 5-325 MG TABLET    Take 1 tablet by mouth every 6 (six) hours  as needed.     Milton Ferguson, MD 12/24/15 [phone]

## 2015-12-24 NOTE — ED Notes (Signed)
Pt takaen to ct.

## 2015-12-24 NOTE — ED Triage Notes (Signed)
Pt c/o bilateral lower abd and groin pain since yesterday and dark urine since this am.

## 2015-12-24 NOTE — Discharge Instructions (Signed)
Failure prescriptions today and start taking her antibiotics. Follow-up with her family doctor next week for recheck and return here if having problems before then

## 2015-12-24 NOTE — ED Notes (Signed)
Pt returned from xray

## 2015-12-25 LAB — URINE CULTURE: Culture: NO GROWTH

## 2015-12-27 DIAGNOSIS — K5792 Diverticulitis of intestine, part unspecified, without perforation or abscess without bleeding: Secondary | ICD-10-CM | POA: Diagnosis not present

## 2015-12-27 DIAGNOSIS — K5901 Slow transit constipation: Secondary | ICD-10-CM | POA: Diagnosis not present

## 2015-12-27 DIAGNOSIS — Z6828 Body mass index (BMI) 28.0-28.9, adult: Secondary | ICD-10-CM | POA: Diagnosis not present

## 2016-01-05 MED FILL — XARELTO 20 MG TABLET: 20 | 30 days supply | Qty: 30 | Fill #3

## 2016-02-08 MED FILL — XARELTO 20 MG TABLET: 20 | 30 days supply | Qty: 30 | Fill #0

## 2016-02-09 DIAGNOSIS — H5213 Myopia, bilateral: Secondary | ICD-10-CM | POA: Diagnosis not present

## 2016-02-09 DIAGNOSIS — H52203 Unspecified astigmatism, bilateral: Secondary | ICD-10-CM | POA: Diagnosis not present

## 2016-03-10 MED FILL — XARELTO 20 MG TABLET: 20 | 30 days supply | Qty: 30 | Fill #1

## 2016-04-10 MED FILL — XARELTO 20 MG TABLET: 20 | 30 days supply | Qty: 30 | Fill #2

## 2016-04-19 DIAGNOSIS — Z9189 Other specified personal risk factors, not elsewhere classified: Secondary | ICD-10-CM | POA: Diagnosis not present

## 2016-04-19 DIAGNOSIS — I480 Paroxysmal atrial fibrillation: Secondary | ICD-10-CM | POA: Diagnosis not present

## 2016-04-19 DIAGNOSIS — N183 Chronic kidney disease, stage 3 (moderate): Secondary | ICD-10-CM | POA: Diagnosis not present

## 2016-04-19 DIAGNOSIS — N028 Recurrent and persistent hematuria with other morphologic changes: Secondary | ICD-10-CM | POA: Diagnosis not present

## 2016-04-19 DIAGNOSIS — Z1389 Encounter for screening for other disorder: Secondary | ICD-10-CM | POA: Diagnosis not present

## 2016-05-10 MED FILL — XARELTO 20 MG TABLET: 20 | 30 days supply | Qty: 30 | Fill #3

## 2016-06-14 MED FILL — XARELTO 20 MG TABLET: 20 | 30 days supply | Qty: 30 | Fill #0

## 2016-06-17 DIAGNOSIS — Z6829 Body mass index (BMI) 29.0-29.9, adult: Secondary | ICD-10-CM | POA: Diagnosis not present

## 2016-06-17 DIAGNOSIS — J019 Acute sinusitis, unspecified: Secondary | ICD-10-CM | POA: Diagnosis not present

## 2016-06-17 DIAGNOSIS — J309 Allergic rhinitis, unspecified: Secondary | ICD-10-CM | POA: Diagnosis not present

## 2016-06-20 DIAGNOSIS — R319 Hematuria, unspecified: Secondary | ICD-10-CM | POA: Diagnosis not present

## 2016-06-20 DIAGNOSIS — K5792 Diverticulitis of intestine, part unspecified, without perforation or abscess without bleeding: Secondary | ICD-10-CM | POA: Diagnosis not present

## 2016-06-20 DIAGNOSIS — Z6829 Body mass index (BMI) 29.0-29.9, adult: Secondary | ICD-10-CM | POA: Diagnosis not present

## 2016-06-22 ENCOUNTER — Ambulatory Visit: Payer: Self-pay

## 2016-06-22 DIAGNOSIS — Z23 Encounter for immunization: Secondary | ICD-10-CM

## 2016-06-23 DIAGNOSIS — Z6829 Body mass index (BMI) 29.0-29.9, adult: Secondary | ICD-10-CM | POA: Diagnosis not present

## 2016-06-23 DIAGNOSIS — N028 Recurrent and persistent hematuria with other morphologic changes: Secondary | ICD-10-CM | POA: Diagnosis not present

## 2016-06-23 DIAGNOSIS — N183 Chronic kidney disease, stage 3 (moderate): Secondary | ICD-10-CM | POA: Diagnosis not present

## 2016-06-23 DIAGNOSIS — I10 Essential (primary) hypertension: Secondary | ICD-10-CM | POA: Diagnosis not present

## 2016-06-27 ENCOUNTER — Ambulatory Visit: Payer: Self-pay | Admitting: Medical

## 2016-06-27 ENCOUNTER — Encounter: Payer: Self-pay | Admitting: Medical

## 2016-06-27 VITALS — BP 125/75 | HR 56 | Temp 96.8°F | Resp 16 | Wt 224.0 lb

## 2016-06-27 DIAGNOSIS — Z719 Counseling, unspecified: Secondary | ICD-10-CM

## 2016-06-27 NOTE — Progress Notes (Signed)
   Subjective:    Patient ID: Jerry Richards, male    DOB: [birth year], 46 y.o.   MRN: [id number]  HPI Last Monday felt not right felt abdominal bloated. He pressed on his left lower abdomen and felt pain he was  seen by  His family doctor Jerry Alberta MD placed on Cipro , Augmentin  He thinks antibiotics are  for 10 days), as of today now no pain llq or else where on the abdomen.  Only thing he can think of was he ate a dairy queen with chocolate chips. Just came in to talk about diverticulitis and how to prevent it. Last time I saw patient was when he had a severe and his first  case of diverticulitis and I recommended he go to the Emergency Department  (10/17 reviewed notes).    Review of Systems  Constitutional: Negative for appetite change and fatigue.  HENT: Negative for congestion and ear pain.   Eyes: Negative for itching.  Respiratory: Negative for cough, shortness of breath and stridor.   Cardiovascular: Negative for chest pain.  Gastrointestinal: Negative for abdominal distention, abdominal pain, diarrhea, nausea and vomiting.  Endocrine: Negative for cold intolerance and heat intolerance.  Genitourinary: Negative for hematuria.  Musculoskeletal: Negative for back pain and neck pain.  Skin: Negative for rash and wound.  Neurological: Negative for dizziness, syncope and light-headedness.  Psychiatric/Behavioral: Negative for confusion and hallucinations.       Objective:   Physical Exam  Constitutional: He is oriented to person, place, and time. He appears well-developed and well-nourished.  HENT:  Head: Normocephalic and atraumatic.  Eyes: Conjunctivae and EOM are normal. Pupils are equal, round, and reactive to light.  Neck: Normal range of motion.  Abdominal: Soft. Bowel sounds are normal. He exhibits no distension and no mass. There is no tenderness. There is no rebound and no guarding.  Musculoskeletal: Normal range of motion.  Neurological: He is alert and oriented  to person, place, and time.  Skin: Skin is warm and dry.  Psychiatric: He has a normal mood and affect. His behavior is normal.   He appears well and in good spirits today.       Assessment & Plan:  Return to Dr. Olena Heckle  or  me ,  if pain or guarding occur, or fever or any other concerns. Will set up dietician referral with Maggie May for review of adding protein and fiber to his diet, and avoiding foods that may aggravate diverticulitis and adding in fiber and protein to his diet. Print off fiber  Info left for Maggie May that she may give patient.. This morning he only ate a banana.

## 2016-06-27 NOTE — Patient Instructions (Addendum)
To follow up with [facility] May as scheduled per [facility]. May 22nd at  [facility] printed off for patient and left in [facility] folder.

## 2016-07-14 MED FILL — XARELTO 20 MG TABLET: 20 | 30 days supply | Qty: 30 | Fill #1

## 2016-07-18 ENCOUNTER — Ambulatory Visit: Payer: Self-pay | Admitting: Dietician

## 2016-07-19 NOTE — Progress Notes (Signed)
Nutrition Consult: Maggie Odessia Asleson, (07/18/2016)  CC:  "I want to know just exactly how I should eat for diverticulitis."   HX:  Reports that in months past had an episode of diverticulitis, where he was treated out-patient with antibiotics and "high-powered pain meds."  Three weeks ago, he experienced another episode that necessitated antibiotics and pain medications, but was not as sever as the previous episode.  He questioned possible dietary interventions and has been told that there was really no special changes.  His wife is a Marine scientist and had related recommended dietary changes from previous experiences with patients.  Given his recent experiences, he relates a "need to changing and maybe prevent future issues."  Assessment: Doesn't give a 24 hr recall, but notes that he is trying to "eat better."   Has started to avoid seeds and nuts."   Currently not exercising on a regular basis.  Notes that he stays busy all the time with his job and is always active. Gives HX of an ablation for atrial fib/flutter.  Recommendations: + Continue to try to eliminate the seeds and nuts. + Use more cooked vegetables rather than raw. + Use the creamy nut butters. + Try to remove the seeds from tomatoes. + Try to walk some, Aim for 1-2 miles for strengthening      heart. + When traveling on vacation, walk daily. + F/U as needed.  Scientist, clinical (histocompatibility and immunogenetics); List of interventions.

## 2016-08-11 MED FILL — XARELTO 20 MG TABLET: 20 | 30 days supply | Qty: 30 | Fill #2

## 2016-09-07 ENCOUNTER — Ambulatory Visit: Payer: Self-pay

## 2016-09-11 MED FILL — XARELTO 20 MG TABLET: 20 | 30 days supply | Qty: 30 | Fill #3

## 2016-09-15 ENCOUNTER — Ambulatory Visit: Payer: Self-pay

## 2016-10-16 MED FILL — XARELTO 20 MG TABLET: 20 | 30 days supply | Qty: 30 | Fill #0

## 2016-11-14 DIAGNOSIS — D649 Anemia, unspecified: Secondary | ICD-10-CM | POA: Diagnosis not present

## 2016-11-14 DIAGNOSIS — D519 Vitamin B12 deficiency anemia, unspecified: Secondary | ICD-10-CM | POA: Diagnosis not present

## 2016-11-14 DIAGNOSIS — D529 Folate deficiency anemia, unspecified: Secondary | ICD-10-CM | POA: Diagnosis not present

## 2016-11-14 DIAGNOSIS — Z0001 Encounter for general adult medical examination with abnormal findings: Secondary | ICD-10-CM | POA: Diagnosis not present

## 2016-11-14 DIAGNOSIS — D509 Iron deficiency anemia, unspecified: Secondary | ICD-10-CM | POA: Diagnosis not present

## 2016-11-14 MED FILL — XARELTO 20 MG TABLET: 20 | 30 days supply | Qty: 30 | Fill #1

## 2016-11-16 DIAGNOSIS — N183 Chronic kidney disease, stage 3 (moderate): Secondary | ICD-10-CM | POA: Diagnosis not present

## 2016-11-16 DIAGNOSIS — Z0001 Encounter for general adult medical examination with abnormal findings: Secondary | ICD-10-CM | POA: Diagnosis not present

## 2016-11-16 DIAGNOSIS — I480 Paroxysmal atrial fibrillation: Secondary | ICD-10-CM | POA: Diagnosis not present

## 2016-11-16 DIAGNOSIS — Z6829 Body mass index (BMI) 29.0-29.9, adult: Secondary | ICD-10-CM | POA: Diagnosis not present

## 2016-11-16 DIAGNOSIS — N028 Recurrent and persistent hematuria with other morphologic changes: Secondary | ICD-10-CM | POA: Diagnosis not present

## 2016-12-02 DIAGNOSIS — J069 Acute upper respiratory infection, unspecified: Secondary | ICD-10-CM | POA: Diagnosis not present

## 2016-12-02 DIAGNOSIS — Z6829 Body mass index (BMI) 29.0-29.9, adult: Secondary | ICD-10-CM | POA: Diagnosis not present

## 2016-12-02 DIAGNOSIS — L309 Dermatitis, unspecified: Secondary | ICD-10-CM | POA: Diagnosis not present

## 2016-12-15 MED FILL — XARELTO 20 MG TABLET: 20 | 30 days supply | Qty: 30 | Fill #2

## 2017-01-12 MED FILL — XARELTO 20 MG TABLET: 20 | 30 days supply | Qty: 30 | Fill #3

## 2017-02-13 MED FILL — XARELTO 20 MG TABLET: 20 | 30 days supply | Qty: 30 | Fill #0

## 2017-02-26 DIAGNOSIS — N183 Chronic kidney disease, stage 3 (moderate): Secondary | ICD-10-CM | POA: Diagnosis not present

## 2017-02-26 DIAGNOSIS — N028 Recurrent and persistent hematuria with other morphologic changes: Secondary | ICD-10-CM | POA: Diagnosis not present

## 2017-02-26 DIAGNOSIS — I10 Essential (primary) hypertension: Secondary | ICD-10-CM | POA: Diagnosis not present

## 2017-02-26 DIAGNOSIS — Z6829 Body mass index (BMI) 29.0-29.9, adult: Secondary | ICD-10-CM | POA: Diagnosis not present

## 2017-03-21 MED FILL — XARELTO 20 MG TABLET: 20 | 30 days supply | Qty: 30 | Fill #1

## 2017-03-29 ENCOUNTER — Encounter: Payer: Self-pay | Admitting: Adult Health

## 2017-03-29 ENCOUNTER — Ambulatory Visit: Payer: PRIVATE HEALTH INSURANCE

## 2017-03-29 ENCOUNTER — Ambulatory Visit: Payer: Self-pay | Admitting: Adult Health

## 2017-03-29 ENCOUNTER — Ambulatory Visit
Admission: RE | Admit: 2017-03-29 | Discharge: 2017-03-29 | Disposition: A | Discharge status: Home / Self Care | Payer: BLUE CROSS/BLUE SHIELD | Source: Ambulatory Visit | Attending: Adult Health | Admitting: Adult Health

## 2017-03-29 ENCOUNTER — Telehealth: Payer: Self-pay

## 2017-03-29 VITALS — BP 129/74 | HR 60 | Temp 97.7°F | Resp 16 | Ht 72.0 in | Wt 221.0 lb

## 2017-03-29 DIAGNOSIS — R1032 Left lower quadrant pain: Secondary | ICD-10-CM

## 2017-03-29 DIAGNOSIS — R319 Hematuria, unspecified: Secondary | ICD-10-CM

## 2017-03-29 LAB — POCT URINALYSIS DIPSTICK
BILIRUBIN UA: NEGATIVE
GLUCOSE UA: NEGATIVE
KETONES UA: NEGATIVE
Leukocytes, UA: NEGATIVE
Nitrite, UA: NEGATIVE
Odor: NEGATIVE
Spec Grav, UA: 1.01 (ref 1.010–1.025)
UROBILINOGEN UA: 1 U/dL
pH, UA: 6 (ref 5.0–8.0)

## 2017-03-29 LAB — COMPREHENSIVE METABOLIC PANEL
A/G RATIO: 2.1 (ref 1.2–2.2)
ALBUMIN: 5.2 g/dL (ref 3.5–5.5)
ALK PHOS: 57 IU/L (ref 39–117)
ALT: 15 IU/L (ref 0–44)
AST: 18 IU/L (ref 0–40)
BILIRUBIN TOTAL: 0.6 mg/dL (ref 0.0–1.2)
BUN / CREAT RATIO: 11 (ref 9–20)
BUN: 18 mg/dL (ref 6–24)
CHLORIDE: 100 mmol/L (ref 96–106)
CO2: 25 mmol/L (ref 20–29)
CREATININE: 1.66 mg/dL — AB (ref 0.76–1.27)
Calcium: 10 mg/dL (ref 8.7–10.2)
GFR calc Af Amer: 56 mL/min/{1.73_m2} — ABNORMAL LOW (ref >59)
GFR calc non Af Amer: 48 mL/min/{1.73_m2} — ABNORMAL LOW (ref >59)
GLOBULIN, TOTAL: 2.5 g/dL (ref 1.5–4.5)
Glucose: 96 mg/dL (ref 65–99)
POTASSIUM: 5.2 mmol/L (ref 3.5–5.2)
SODIUM: 140 mmol/L (ref 134–144)
Total Protein: 7.7 g/dL (ref 6.0–8.5)

## 2017-03-29 LAB — CBC WITH DIFFERENTIAL/PLATELET
Basophils Absolute: 0 10*3/uL (ref 0.0–0.2)
Basos: 1 %
EOS (ABSOLUTE): 0.4 10*3/uL (ref 0.0–0.4)
EOS: 6 %
HEMATOCRIT: 40.4 % (ref 37.5–51.0)
HEMOGLOBIN: 14.5 g/dL (ref 13.0–17.7)
Immature Grans (Abs): 0 10*3/uL (ref 0.0–0.1)
Immature Granulocytes: 0 %
LYMPHS ABS: 1.5 10*3/uL (ref 0.7–3.1)
Lymphs: 23 %
MCH: 30.6 pg (ref 26.6–33.0)
MCHC: 35.9 g/dL — AB (ref 31.5–35.7)
MCV: 85 fL (ref 79–97)
MONOCYTES: 9 %
MONOS ABS: 0.6 10*3/uL (ref 0.1–0.9)
NEUTROS ABS: 4.1 10*3/uL (ref 1.4–7.0)
Neutrophils: 61 %
Platelets: 264 10*3/uL (ref 150–379)
RBC: 4.74 x10E6/uL (ref 4.14–5.80)
RDW: 13.3 % (ref 12.3–15.4)
WBC: 6.6 10*3/uL (ref 3.4–10.8)

## 2017-03-29 NOTE — Progress Notes (Signed)
Kidney function abnormal  improved from 1 year ago, provider advises follow up with [name], MD and your nephrologist regarding these labs and gross hematuria in urine point of care testing. Urine culture pending. Kidney function: GFR 48, Creatinine 1.66.  Recommend Gastrointestinal MD Referral will be happy to refer you or you can have [name], MD refer please call [place] if you would like [place] to refer you.  Recommend colonoscopy and follow up with your [name], MD for hematuria, persistent left lower quadrant pain, abnormal CT scan in 2017 in ER showing lung nodule 6 mm unchanged from previous. This should be followed up on with [name], MD.  CT scan is pending. Go to the Emergency Room should any symptoms change or worsen. Call 911 for emergency symptoms.

## 2017-03-29 NOTE — Progress Notes (Signed)
Subjective:     Patient ID: Jerry Richards, male   DOB: Jun 09, 1970, 47 y.o.   MRN: [id number]  HPI   Blood pressure 129/74, pulse 60, temperature 97.7 F (36.5 C), resp. rate 16, height 6' (1.829 m), weight 221 lb (100.2 kg), SpO2 99 %.  Patient is a 47 year old male in no acute distress who presents to the clinic with left lower quadrant abdominal pain that started this Tuesday 03/28/16. Pain 4/10 improved with bowel movements. Normal bowel movements. Denies diarrhea, constipation or blood/ mucous in stools.   He reports that he has not had colonscoscopy as recommended in 11/2015 after being seen in the Emergency Room.   He reports he had diverticulitis on a cruise last summer and was treated and resolved.  He also reports being treated one time prior to this.   Denies any testicular pain/ or edema.   Previous left sided hernia.    Review of Systems  Constitutional: Negative.   HENT: Negative.   Respiratory: Negative.   Cardiovascular: Negative.   Gastrointestinal: Positive for abdominal pain (4/10 left lower quadrant ). Negative for abdominal distention, anal bleeding, blood in stool, constipation, diarrhea, nausea, rectal pain and vomiting.       Bloating mild he reports   Genitourinary: Negative for decreased urine volume, difficulty urinating, discharge, dysuria, enuresis, flank pain, frequency, genital sores, hematuria, penile pain, penile swelling, scrotal swelling, testicular pain and urgency.  Musculoskeletal: Negative.   Skin: Negative.   Allergic/Immunologic: Negative.   Neurological: Negative.   Hematological: Negative.   Psychiatric/Behavioral: Negative.       After doing POCT urine to rule out infection repotted to patient  Results with large hematuria noted he reports he is seeing nephrologist last documented BUN 22 and Creatinine 1.86 - none since he reports he saw his nephrologist in December 2018  and reports kidney function was normal at that time. He reports he  has been told before that he has hematuria.   History review shows Stage III Kidney disease.   He reports seeing [name], MD regularly with next appointment in March.   Current Outpatient Medications:  .  losartan (COZAAR) 50 MG tablet, Take 50 mg by mouth daily., Disp: , Rfl: 6 .  Rivaroxaban (XARELTO) 20 MG TABS, Take 1 tablet by mouth daily., Disp: , Rfl:  Objective:   Physical Exam  Constitutional: He is oriented to person, place, and time. He appears well-developed and well-nourished. No distress.  HENT:  Head: Normocephalic and atraumatic.  Eyes: Conjunctivae are normal. Pupils are equal, round, and reactive to light.  Neck: Normal range of motion. Neck supple. No JVD present. No tracheal deviation present.  Cardiovascular: Normal rate, regular rhythm, normal heart sounds and intact distal pulses. Exam reveals no gallop and no friction rub.  No murmur heard. Pulmonary/Chest: Effort normal and breath sounds normal. No stridor. No respiratory distress. He has no wheezes. He has no rales. He exhibits no tenderness.  Abdominal: Soft. Bowel sounds are normal. He exhibits no shifting dullness, no distension, no pulsatile liver, no fluid wave, no abdominal bruit, no ascites, no pulsatile midline mass and no mass. There is no hepatosplenomegaly, splenomegaly or hepatomegaly. There is no tenderness (LLQ ). There is no rigidity, no rebound, no guarding, no CVA tenderness, no tenderness at McBurney's point and negative Murphy's sign. A hernia is present. Hernia confirmed negative in the ventral area. Inguinal: patinet declined  Left inguinal: patinet declined     Previous hernia operation  scar documented on LLQ graphical documentation.  Left lower quadrant tenderness with palpation.   Genitourinary:  Genitourinary Comments: Patient declined- denies any testicular or penile pain or discharge. Denies any edema or color change.   Musculoskeletal: Normal range of motion.  Patient moves on  and off of exam table and in room without difficulty. Gait is normal in hall and in room. Patient is oriented to person place time and situation. Patient answers questions appropriately and engages in conversation.   Lymphadenopathy:    He has no cervical adenopathy.  Neurological: He is alert and oriented to person, place, and time.  Skin: Skin is warm and dry. No rash noted. He is not diaphoretic. No cyanosis or erythema. No pallor. Nails show no clubbing.  Psychiatric: He has a normal mood and affect. His behavior is normal. Judgment and thought content normal.  Vitals reviewed.      Assessment:     Hematuria, unspecified type  Left lower quadrant pain - Plan: CBC w/Diff, Comprehensive metabolic panel, CT Abdomen Pelvis W Contrast, POCT urinalysis dipstick, Urine Culture, Urine Culture   Plan:     Provider recommends evaluation at Emergency Room now as no definitive symptoms and large hematuruia patient declines despite risks versus benefits discussed.      Incidental finding of hematuria on urine dipstick- patient is on Xarelto.informed patient that this provider can not exclude bleeding elsewhere and he should be evaluated and  Recommended Emergency room for evaluation for specialty services. He reports he will follow up with his urologist - he is unsure of his name and his [name], MD who prescribes Xarelto today.  He will also follow up with his [name], MD regarding his last CT scan in 2017.   He denies having a gastroenterologist for colonoscopy and denies having any recent colonoscopy or ever and is aware this was recommended in 2017 at  time of his ED visit / CT scan.  Recent Results (from the past 2160 hour(s))  POCT urinalysis dipstick     Status: Abnormal   Collection Time: 03/29/17 10:48 AM  Result Value Ref Range   Color, UA yellow    Clarity, UA clear    Glucose, UA neg    Bilirubin, UA neg    Ketones, UA neg    Spec Grav, UA 1.010 1.010 - 1.025     Blood, UA large    pH, UA 6.0 5.0 - 8.0   Protein, UA trace    Urobilinogen, UA 1.0 0.2 or 1.0 E.U./dL   Nitrite, UA neg    Leukocytes, UA Negative Negative   Appearance     Odor neg      Current Outpatient Medications:  .  losartan (COZAAR) 50 MG tablet, Take 50 mg by mouth daily., Disp: , Rfl: 6 .  Rivaroxaban (XARELTO) 20 MG TABS, Take 1 tablet by mouth daily., Disp: , Rfl:   Provider thoroughly discussed in collaboration above plan with supervising physician Dr. Miguel Aschoff who is in agreement if patient refuses Emergency room visit now which is best medical advice thencan do CT abdomen / pelvis scan if labs return and if symptoms worsen 911 or Emergency Room. Patient should also follow up with his [name], MD regarding labs and specialist appointments. Can refer to GI. Patient has a nephrologist.Advised no antibiotic  treatment as in agreement no definitive symptoms of diverticulitis/ afebrile/ stools normal.       Patient left while provider was awaiting callback from  supervising MD, he asked provider to cal him as he was going back to work, left to quickly to get [facility] paper signed.   Nelda Severe RN called [facility] they will not schedule CT with contrast unless STAT Creatinine is returned. Patient was informed and is aware labs are pending and we will try to schedule this ASAP given normal kidney function. Marland Kitchen He is advised Emergency room or 911 if any emergent symptoms occur or symptoms worsen at anytime.  Patient verbalized understanding of all instructions given and denies any further questions at this time.   Orders Placed This Encounter  Procedures  . Urine Culture  . Urine Culture  . CT Abdomen Pelvis W Contrast    Epic order Rechecking kidney function STAT now /  Left lower quadrant pain Wt 221/no diab/nkda to iodine/ct cm/kid da/Afib/no needs Ins - primary physician care/[facility]    Standing Status:   Future    Standing Expiration  Date:   06/27/2018    Order Specific Question:   If indicated for the ordered procedure, I authorize the administration of contrast media per Radiology protocol    Answer:   Yes    Order Specific Question:   Preferred imaging location?    Answer:   [id number]    Order Specific Question:   Call Results- Best Contact Number?    Answer:   [phone]    Order Specific Question:   Radiology Contrast Protocol - do NOT remove file path    Answer:   [email or web address]    Order Specific Question:   Reason for Exam additional comments    Answer:   left lower quadrant pain rule out diverticulitits- patinet reports history/ CT last 2017 for [facility] at [facility]  . CBC w/Diff  . Comprehensive metabolic panel  . POCT urinalysis dipstick

## 2017-03-29 NOTE — Patient Instructions (Signed)
Clean Catch Urine Collection The clean catch urine collection method is a way to collect a urine sample for lab testing. The collection method includes:  Cleaning the genital area.  Collecting midstream urine. It is important to catch the middle part of the urine flow.  Securing the sample for lab testing.  What is a clean catch urine collection? Using a clean catch method reduces the chance that other bacteria and fluids from the genital area will be collected in the urine sample. Many tests may be performed on the urine sample to help you and your health care provider determine appropriate treatment options. The clean catch collection method varies slightly for men, women, and infants. How do I collect a clean catch urine sample? You need the following supplies:  Cleansing wipes.  Collection container.  Females: 1. Wash your hands with soap and water. 2. Place the cleansing wipes and collection container within reach. 3. Open the collection container and place the lid with the flat side down. Be careful not to touch the inside of the lid. 4. Spread your legs open and separate the folds of skin (labia). 5. Clean your genital area:  Take the first cleansing wipe and wipe from front to back inside your labia. Throw the cleansing wipe away.  Take the second cleansing wipe and clean around the middle area of your genitals.  6. Continue to hold your labial skin folds open while collecting the sample:  Urinate a small amount into the toilet, then stop the flow.  Hold the collection container under your genital area.  Urinate into the collection container until it is about half full, then stop.  Set the collection container down.  Let go of your labial folds and finish urinating into the toilet.  Secure the lid onto the collection container. Avoid touching the inside of the lid and collection container.  Wash your hands.  7. Label the collection container as directed. 8. If  you are at home, keep the sample refrigerated until you take it to the lab. Males: 1. Wash your hands with soap and water. 2. Place the cleansing wipes and collection container within reach. 3. Open the collection container and place the lid with the flat side down. Be careful not to touch the inside of the lid. 4. Clean your genital area:  Take a cleansing wipe and clean all around the tip of your penis. Make sure the foreskin is pulled back away from the opening, if necessary.  Throw the cleansing wipe away.  5. Collect the sample:  Urinate a small amount into the toilet, then stop the flow.  Hold the collection container close to the opening of your penis.  Urinate into the collection container until it is about half full, then stop.  Set the collection container down.  Finish urinating into the toilet.  Secure the lid onto the collection container. Avoid touching the inside of the lid and collection container.  Wash your hands.  6. Label the collection container as directed. 7. If you are at home, keep the sample refrigerated until you take it to the lab. Infants: 1. Wash your hands with soap and water. 2. Open the collection bag. 3. Wash your infant's genital area with cleansing wipes. Do not apply any ointments or antiseptics immediately before cleansing. 4. Place the collection bag over the penis or labia. Attach the adhesive to your infant's skin. 5. Place a new diaper on your infant over the collection bag. 6. Wash your hands. 7.  Monitor your baby. Remove the collection bag after your infant has urinated. 8. You may need to transfer the urine into a collection container. 9. Label the collection container as directed. 10. If you are at home, keep the sample refrigerated until you take it to the lab. This information is not intended to replace advice given to you by your health care provider. Make sure you discuss any questions you have with your health care  provider. Document Released: 08/03/2009 Document Revised: 10/19/2015 Document Reviewed: 07/09/2013 Elsevier Interactive Patient Education  2018 [employer]. Abdominal Pain, Adult Many things can cause belly (abdominal) pain. Most times, belly pain is not dangerous. Many cases of belly pain can be watched and treated at home. Sometimes belly pain is serious, though. Your doctor will try to find the cause of your belly pain. Follow these instructions at home:  Take over-the-counter and prescription medicines only as told by your doctor. Do not take medicines that help you poop (laxatives) unless told to by your doctor.  Drink enough fluid to keep your pee (urine) clear or pale yellow.  Watch your belly pain for any changes.  Keep all follow-up visits as told by your doctor. This is important. Contact a doctor if:  Your belly pain changes or gets worse.  You are not hungry, or you lose weight without trying.  You are having trouble pooping (constipated) or have watery poop (diarrhea) for more than 2-3 days.  You have pain when you pee or poop.  Your belly pain wakes you up at night.  Your pain gets worse with meals, after eating, or with certain foods.  You are throwing up and cannot keep anything down.  You have a fever. Get help right away if:  Your pain does not go away as soon as your doctor says it should.  You cannot stop throwing up.  Your pain is only in areas of your belly, such as the right side or the left lower part of the belly.  You have bloody or black poop, or poop that looks like tar.  You have very bad pain, cramping, or bloating in your belly.  You have signs of not having enough fluid or water in your body (dehydration), such as: ? Dark pee, very little pee, or no pee. ? Cracked lips. ? Dry mouth. ? Sunken eyes. ? Sleepiness. ? Weakness. This information is not intended to replace advice given to you by your health care provider. Make sure you  discuss any questions you have with your health care provider. Document Released: 08/02/2007 Document Revised: 09/03/2015 Document Reviewed: 07/28/2015 Elsevier Interactive Patient Education  2018 [employer].

## 2017-03-29 NOTE — Telephone Encounter (Signed)
Pt was seen in the office this morning for left sided groin pain.  Abdominal CT with contrast was ordered.  Creatine 1.8 on last lab draw. Contacted [facility] to schedule appointment.  [facility] will not perform CT until we repeat a CMET.  The CMET was repeated in the office this am.  BUN is normal, Creatine 1.66 GFR 48.  These labs were called to [facility] and they are okay with doing the CT Scan. Contacted patient to report to [place] as soon as possible. Pt verbalizes understanding and agrees to go at the present time.

## 2017-03-30 IMAGING — CT CT ABD-PELV W/ CM
3 of 5 series · 13 of 36 positions shown, 19 images · IV contrast (WATER & [ID] ISOVUE 300)
Comparison: [DATE]

CLINICAL DATA: Abdominal pain, left lower quadrant pain

EXAM:
CT ABDOMEN AND PELVIS WITH CONTRAST
TECHNIQUE: Multidetector CT imaging of the abdomen and pelvis was performed
using the standard protocol following bolus administration of
intravenous contrast.
CONTRAST:  100 mL [QS] IV

[Series 3: abd/pelvis with · axial · 0.80mm/px · z∈[-417,-42]mm · 8 of 97 slices shown, 13 images]
[im 11/97  soft-tissue]
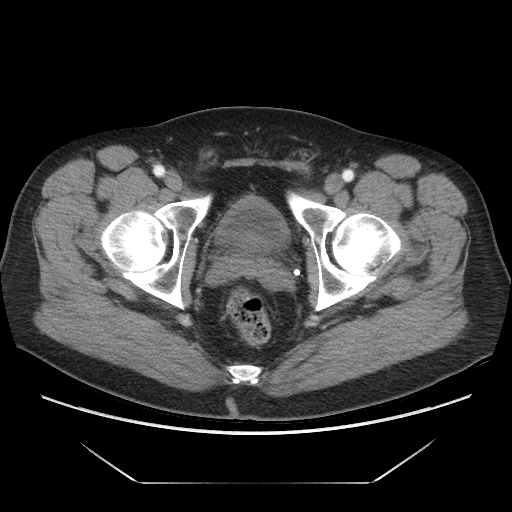
[im 11/97  bone]
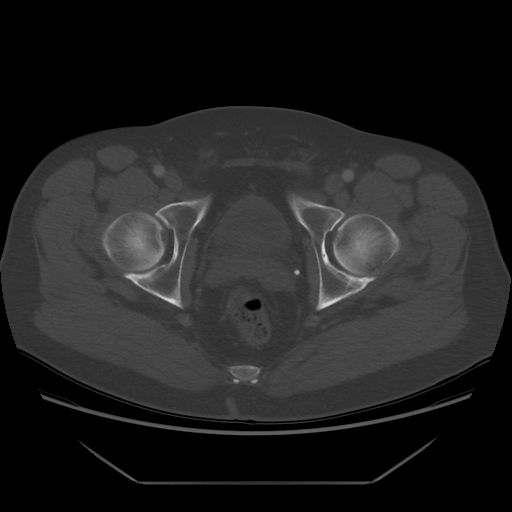
[im 22/97  soft-tissue]
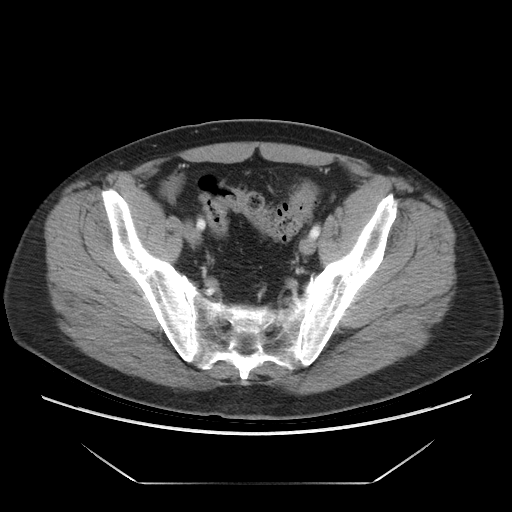
[im 33/97  soft-tissue]
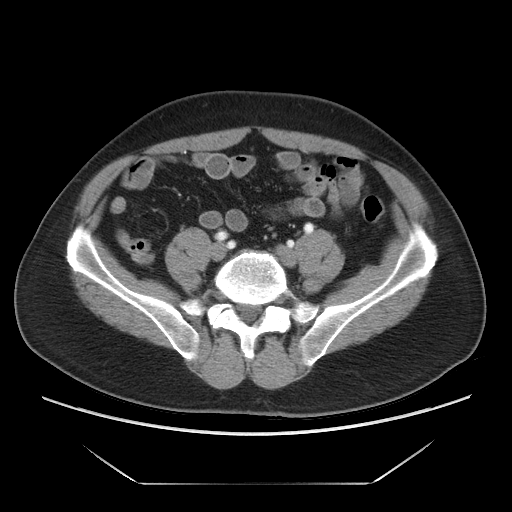
[im 43/97  soft-tissue]
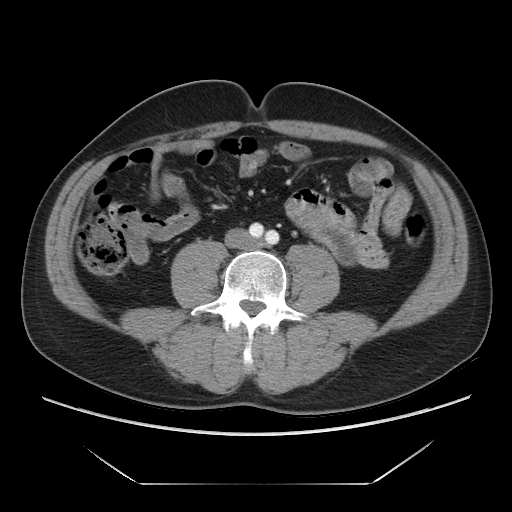
[im 54/97  soft-tissue]
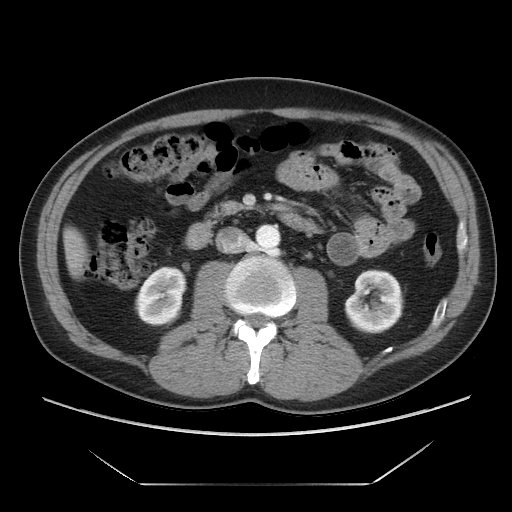
[im 54/97  lung]
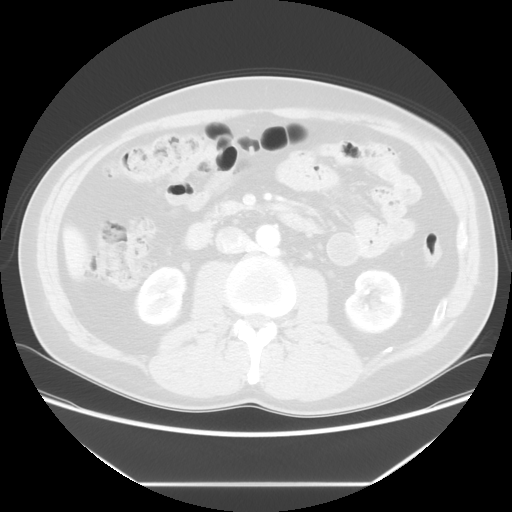
[im 65/97  soft-tissue]
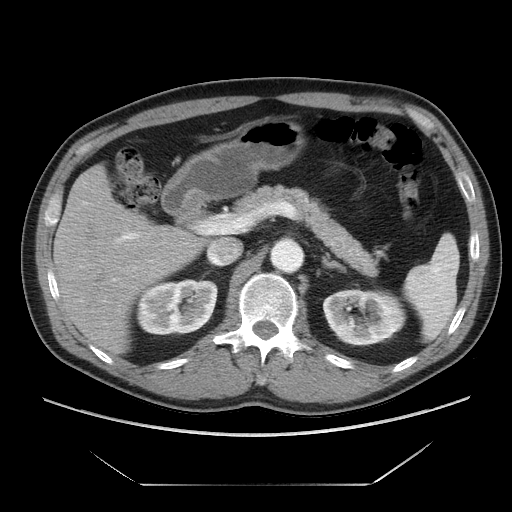
[im 65/97  lung]
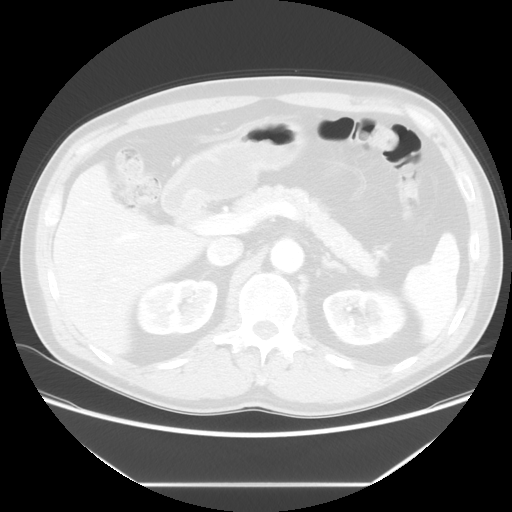
[im 75/97  soft-tissue]
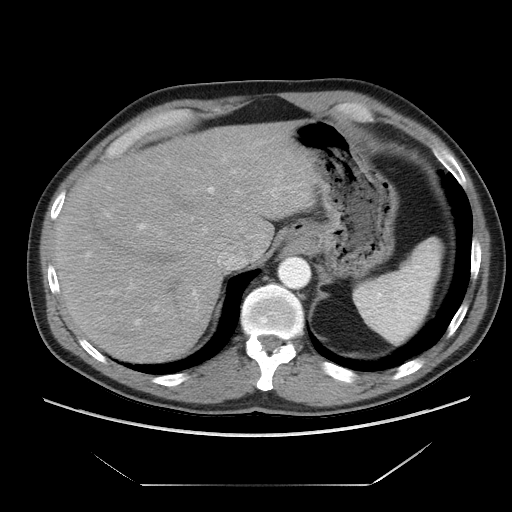
[im 75/97  lung]
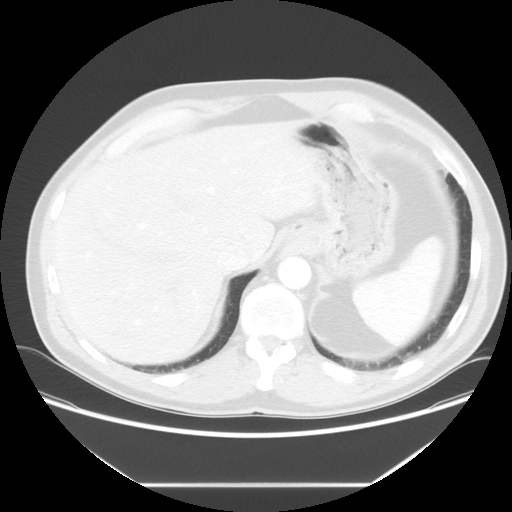
[im 86/97  soft-tissue]
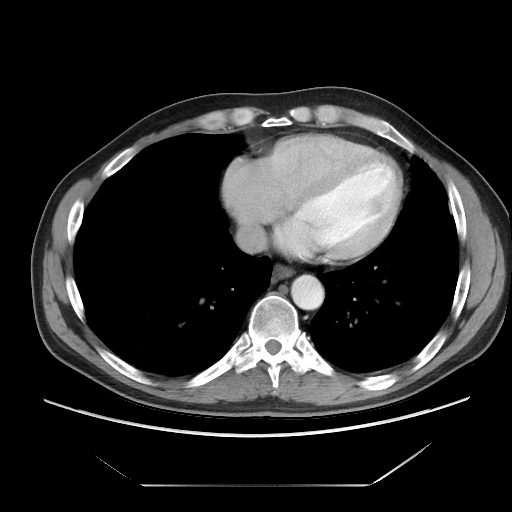
[im 86/97  lung]
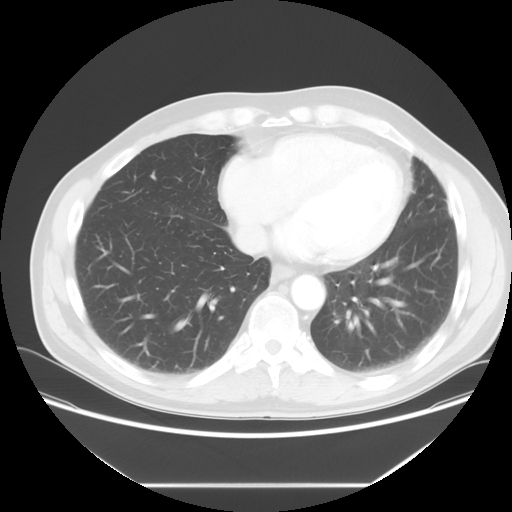

[Series 601: coronal body · coronal · 1.04mm/px · 1 of 115 slices shown, 2 images]
[im 39/115  soft-tissue]
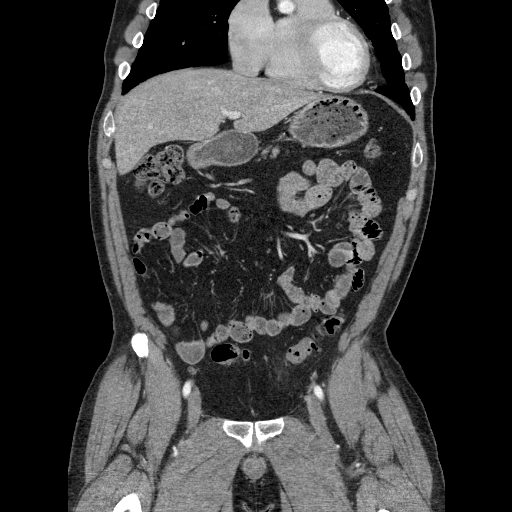
[im 39/115  bone]
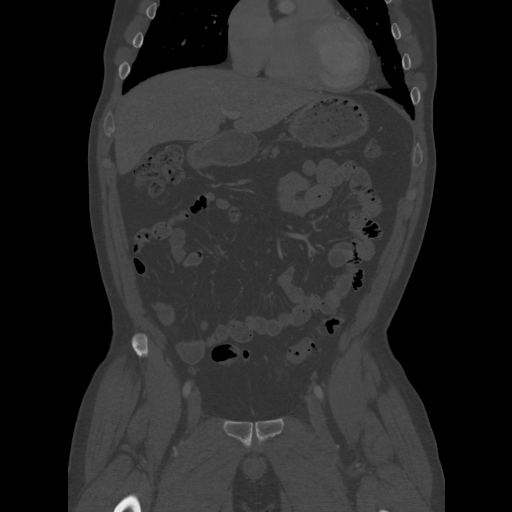

[Series 602: sagittal body · sagittal · 1.04mm/px · 4 of 164 slices shown]
[im 11/164  soft-tissue]
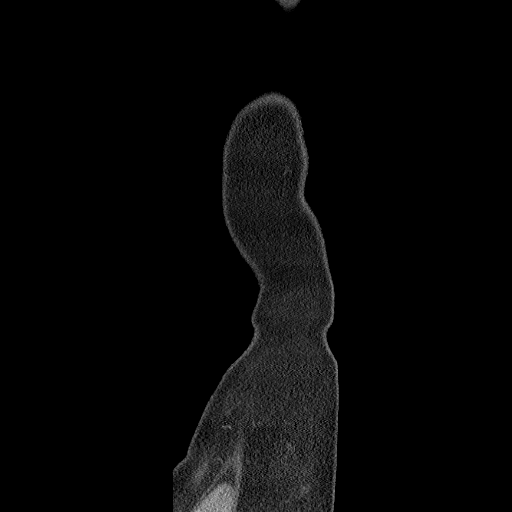
[im 33/164  soft-tissue]
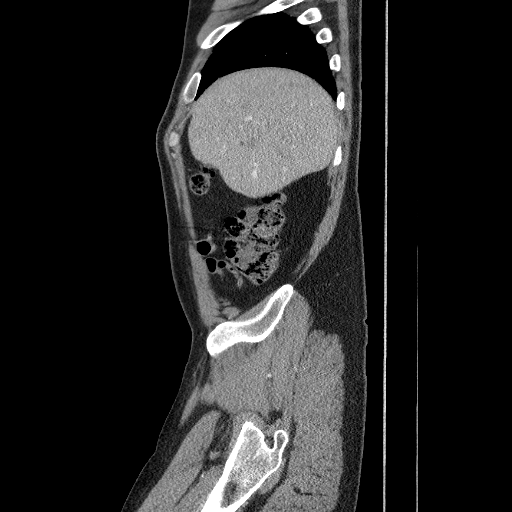
[im 55/164  soft-tissue]
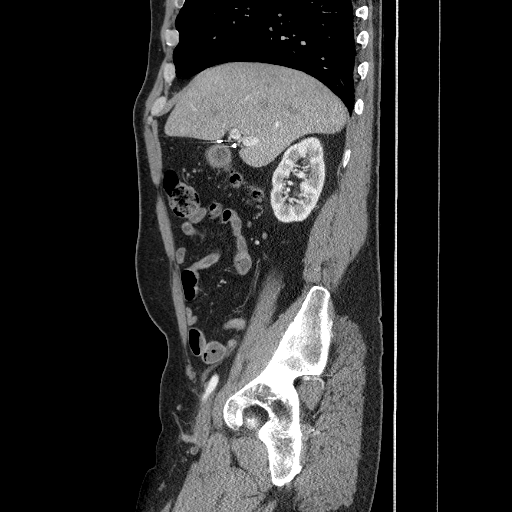
[im 77/164  soft-tissue]
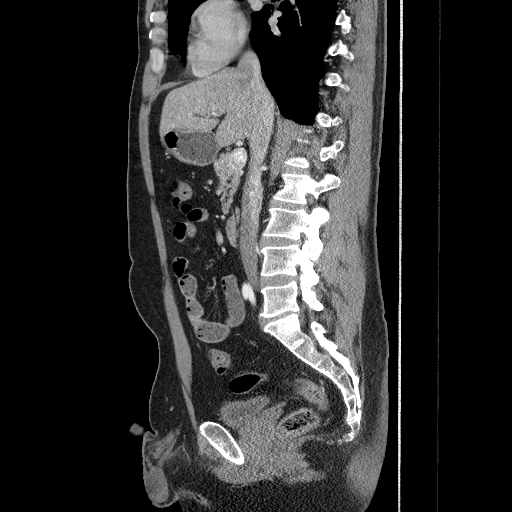

[13 of 36 positions shown; findings below may reference images not displayed]

FINDINGS: Lower chest: Subpleural nodule in the left lower lobe measures 5 mm,
unchanged since prior study compatible with benign nodules. No
effusions. Heart is normal size.

Hepatobiliary: No focal liver abnormality is seen. Status post
cholecystectomy. No biliary dilatation.

Pancreas: No focal abnormality or ductal dilatation.

Spleen: No focal abnormality.  Normal size.

Adrenals/Urinary Tract: No adrenal abnormality. No focal renal
abnormality. No stones or hydronephrosis. Urinary bladder is
unremarkable.

Stomach/Bowel: Slight inflammation noted adjacent to the sigmoid
colon, surrounding a rounded fat density. I favor this represents
epiploic appendagitis rather than diverticulitis. Appendix is
normal. Stomach and small bowel decompressed, unremarkable.

Vascular/Lymphatic: No evidence of aneurysm or adenopathy.

Reproductive: No visible focal abnormality.

Other: No free fluid or free air.

Musculoskeletal: No acute bony abnormality.
IMPRESSION: Slight inflammation adjacent to the sigmoid colon, surrounding
rounded fat density adjacent to the sigmoid colon. I favor this
represents epiploic appendagitis rather than diverticulitis. The
underlying bowel wall does not appear thickened or abnormal.

Prior cholecystectomy.

## 2017-03-30 NOTE — Telephone Encounter (Addendum)
03/30/2017 8:59 AM patient called with results of CT scan and lab work.  Patient was informed that his kidney function is still decreased and that he should follow-up with his primary care physician as well as his nephrologist within the next week.  He is also aware that he had gross hematuria on visit in this acute care clinic on 03/29/2017 ER was advised that patient declined.  He denies any changes in his symptoms since yesterday's visit.  Denies any new symptoms.  Discussed CT scan as documented below patient is aware that radiologist does not suspect diverticulitis but rather epiploic epiploic appendagitis.  Provider  thoroughly discussed with Dr. Rosanna Richards supervising physician who recommends that we do not treat symptoms at this time given results of study not favoring diverticulitis.  Will refer to gastrointestinal doctor for specialty evaluation.  Patient is advised to proceed to the nearest ER should symptoms change or worsen at any time and will  call 911 for emergency symptoms.  Patient continues to deny any diarrhea constipation or rectal bleeding.  He denies any mucus in his stools. She was also informed of the 5 mm left lower lobe nodule that is unchanged since previous CT scan in 2017, though follow-up is recommended by this provider with his primary care doctor who will possibly refer to pulmonary.  He  is also aware that he can return to this office for a referral if needed.  He is also advised that he should follow-up with his primary care physician who follows his Xarelto per patient's report known hematuria, if persist he should see his nephrologist and possibly have primary care sent to urology   Advised Emergency Room if after hours if symptoms persist, change or worsen at anytime and to call. 911 for emergency symptoms at any time. Discussed signs and symptoms of surgical/ emergency abdominal symptoms and other emergency symptoms.   Patient verbalized understanding of all instructions  given and denies any further questions at this time.    EXAM: CT ABDOMEN AND PELVIS WITH CONTRAST  TECHNIQUE: Multidetector CT imaging of the abdomen and pelvis was performed using the standard protocol following bolus administration of intravenous contrast.  CONTRAST:  100 mL Isovue-300 IV  COMPARISON:  12/24/2015  FINDINGS: Lower chest: Subpleural nodule in the left lower lobe measures 5 mm, unchanged since prior study compatible with benign nodules. No effusions. Heart is normal size.  Hepatobiliary: No focal liver abnormality is seen. Status post cholecystectomy. No biliary dilatation.  Pancreas: No focal abnormality or ductal dilatation.  Spleen: No focal abnormality.  Normal size.  Adrenals/Urinary Tract: No adrenal abnormality. No focal renal abnormality. No stones or hydronephrosis. Urinary bladder is unremarkable.  Stomach/Bowel: Slight inflammation noted adjacent to the sigmoid colon, surrounding a rounded fat density. I favor this represents epiploic appendagitis rather than diverticulitis. Appendix is normal. Stomach and small bowel decompressed, unremarkable.  Vascular/Lymphatic: No evidence of aneurysm or adenopathy.  Reproductive: No visible focal abnormality.  Other: No free fluid or free air.  Musculoskeletal: No acute bony abnormality.  IMPRESSION: Slight inflammation adjacent to the sigmoid colon, surrounding rounded fat density adjacent to the sigmoid colon. I favor this represents epiploic appendagitis rather than diverticulitis. The underlying bowel wall does not appear thickened or abnormal.  Prior cholecystectomy.  Provider also reviewed all labs below. He is aware his kidney function is still impaired, avoid NSAID'S and other medications processed by kidneys. Recommend also discussing Cozaar alternatives with kidney disease with Jerry Bis, MD. He is to follow  up with his Jerry Bis, MD and should call today for  appointment within the next week. He also should follow up with his nephrologist. Recommend regular appointments as advised by his primary care and specialty services.    Patient's wife Jerry Richards called she is a Equities trader she reports- provider reviewed all labs and all information from Jerry Richards and this telephone call documentation with her, she is advised a  GI urgent referral has been place to Jerry Richards and is in agreement, she verbalizes she  understand patient needs to be seen for follow up at Jerry Bis, MD  For hematuria, Jerry Richards follow up, lung nodule reported as likely benign present since 2017 per CT scan, and follow up with nephrologist. She understands he may need evaluation by pulmonary and or urology. She reports she will call and schedule appointment with patients physician and nephrologist for follow up. Sh verbalizes no treatment/ antibiotics at this time as provider has discussed with supervising physician and agreement not to treat - reviewed CT scan findings with wife as well. She is aware that if he develops fever, chills, worsening abdominal pain, changes in stool, rectal bleeding, shortness of breath, body aches, hematuria,  or any other worsening/ changing symptoms he will go to the Emergency room or call 911. She is aware colonoscopy is recommended in 2017 for T SCAN to rule out underlying malignancy or other etiology.  She is aware patient declined Emergency room visit on 03/29/16 as advised due to kidney function and need for CT Scan.  to do also had wife called- Jerry Richards Jerry Richards verified wife is on Jerry Richards prior to this provider speaking with wife.Patient's wife  verbalized understanding of all instructions given and denies any further questions at this time.   Recent Results (from the past 2160 hour(s))  CBC w/Diff     Status: Abnormal   Collection Time: 03/29/17 10:45 AM  Result Value Ref Range   WBC 6.6 3.4 - 10.8 x10E3/uL   RBC 4.74 4.14 -  5.80 x10E6/uL   Hemoglobin 14.5 13.0 - 17.7 g/dL   Hematocrit 40.4 37.5 - 51.0 %   MCV 85 79 - 97 fL   MCH 30.6 26.6 - 33.0 pg   MCHC 35.9 (H) 31.5 - 35.7 g/dL   RDW 13.3 12.3 - 15.4 %   Platelets 264 150 - 379 x10E3/uL   Neutrophils 61 Not Estab. %   Lymphs 23 Not Estab. %   Monocytes 9 Not Estab. %   Eos 6 Not Estab. %   Basos 1 Not Estab. %   Neutrophils Absolute 4.1 1.4 - 7.0 x10E3/uL   Lymphocytes Absolute 1.5 0.7 - 3.1 x10E3/uL   Monocytes Absolute 0.6 0.1 - 0.9 x10E3/uL   EOS (ABSOLUTE) 0.4 0.0 - 0.4 x10E3/uL   Basophils Absolute 0.0 0.0 - 0.2 x10E3/uL   Immature Granulocytes 0 Not Estab. %   Immature Grans (Abs) 0.0 0.0 - 0.1 x10E3/uL  Comprehensive metabolic panel     Status: Abnormal   Collection Time: 03/29/17 10:45 AM  Result Value Ref Range   Glucose 96 65 - 99 mg/dL   BUN 18 6 - 24 mg/dL   Creatinine, Ser 1.66 (H) 0.76 - 1.27 mg/dL   GFR calc non Af Amer 48 (L) >59 mL/min/1.73   GFR calc Af Amer 56 (L) >59 mL/min/1.73   BUN/Creatinine Ratio 11 9 - 20   Sodium 140 134 - 144 mmol/L   Potassium 5.2 3.5 - 5.2  mmol/L   Chloride 100 96 - 106 mmol/L   CO2 25 20 - 29 mmol/L   Calcium 10.0 8.7 - 10.2 mg/dL   Total Protein 7.7 6.0 - 8.5 g/dL   Albumin 5.2 3.5 - 5.5 g/dL   Globulin, Total 2.5 1.5 - 4.5 g/dL   Albumin/Globulin Ratio 2.1 1.2 - 2.2   Bilirubin Total 0.6 0.0 - 1.2 mg/dL   Alkaline Phosphatase 57 39 - 117 IU/L   AST 18 0 - 40 IU/L   ALT 15 0 - 44 IU/L  POCT urinalysis dipstick     Status: Abnormal   Collection Time: 03/29/17 10:48 AM  Result Value Ref Range   Color, UA yellow    Clarity, UA clear    Glucose, UA neg    Bilirubin, UA neg    Ketones, UA neg    Spec Grav, UA 1.010 1.010 - 1.025   Blood, UA large    pH, UA 6.0 5.0 - 8.0   Protein, UA trace    Urobilinogen, UA 1.0 0.2 or 1.0 E.U./dL   Nitrite, UA neg    Leukocytes, UA Negative Negative   Appearance     Odor neg

## 2017-03-31 LAB — URINE CULTURE: ORGANISM ID, BACTERIA: NO GROWTH

## 2017-04-01 NOTE — Progress Notes (Signed)
Urine culture is negative. Please follow up with nephrologist and your primary care for further work up for blood in urine . Return to this office as needed and call if questions.[name] should be calling you to inform you of appointment February 5th, please keep this appointment for evaluation.

## 2017-04-03 ENCOUNTER — Encounter: Payer: Self-pay | Admitting: Physician Assistant

## 2017-04-03 ENCOUNTER — Ambulatory Visit: Payer: BLUE CROSS/BLUE SHIELD | Admitting: Physician Assistant

## 2017-04-03 ENCOUNTER — Telehealth: Payer: Self-pay | Admitting: Emergency Medicine

## 2017-04-03 VITALS — BP 102/60 | HR 58 | Ht 72.0 in | Wt 219.0 lb

## 2017-04-03 DIAGNOSIS — Z8719 Personal history of other diseases of the digestive system: Secondary | ICD-10-CM | POA: Diagnosis not present

## 2017-04-03 DIAGNOSIS — R1032 Left lower quadrant pain: Secondary | ICD-10-CM

## 2017-04-03 MED ORDER — HYOSCYAMINE SULFATE 0.125 MG SL SUBL: 0.1250 mg | SUBLINGUAL_TABLET | Freq: Four times a day (QID) | SUBLINGUAL | 0 refills | Status: DC | PRN

## 2017-04-03 MED ORDER — DICYCLOMINE HCL 10 MG PO CAPS: 10.0000 mg | ORAL_CAPSULE | ORAL | 2 refills | Status: DC | PRN

## 2017-04-03 MED ORDER — PEG 3350-KCL-NA BICARB-NACL 420 G PO SOLR: 4000.0000 mL | Freq: Once | ORAL | 0 refills | Status: AC

## 2017-04-03 NOTE — Progress Notes (Signed)
I agree with the above note, plan 

## 2017-04-03 NOTE — Progress Notes (Signed)
Chief Complaint: Left lower quadrant abdominal pain  HPI:    Jerry Richards is a 47 year old Caucasian male with a past medical history of A. fib on Eliquis, as well as others listed below, who was referred to me by Doreen Beam, F* for a complaint of eft lower quadrant pain.      Per review of chart patient had CT abdomen pelvis without contrast 12/24/15 which showed circumferential wall thickening of the sigmoid colon with associated surrounding fat stranding compatible with focal colitis/diverticulitis.  This also showed hepatic steatosis.  Patient had recent CT abdomen pelvis with contrast 03/29/17 which showed slight inflammation adjacent to the sigmoid colon, surrounding rounded fat density density adjacent to the sigmoid colon.  This favored epiploic appendagitis rather than diverticulitis.  The underlying bowel wall did not appear thickened or abnormal.  As well as prior cholecystectomy.  Labs completed 1/31/9 show a normal CBC.  CMP shows elevated creatinine at 1.66 and is otherwise normal.    Today, the patient presents to clinic accompanied by his wife who is an Therapist, sports and does assist with his history, for a complaint of left lower quadrant pain.  Patient tells me that in 2017 he was diagnosed with his first episode of diverticulitis.  He was put on oral antibiotics.  He believes this was ciprofloxacin and metronidazole.  Patient tells me he then had another recurrence later in the year.  Per his wife this has recurred 5 times.  The most recent started on 03/26/17.  The patient describes a left lower quadrant "pulling" sensation which is related to a tightness and bloating in his abdomen which hurts worse when he is walking around.  This most recent episode increased in intensity until 03/30/17, but over the past 3 days the patient tells me that his symptoms are much better.  He was not started on antibiotics for this recent episode as this was thought "not diverticulitis" by his PCP after CT above.   The patient tells me currently he only has a lingering 2-3/10 pain in his left lower quadrant.  This is no worse with a bowel movement.  He has never had rectal bleeding.  Patient denies a previous colonoscopy.    Patient denies fever, chills, blood in the stool, melena, weight loss, anorexia, nausea, vomiting, heartburn, reflux or symptoms that awaken him at night.  Past Medical History:  Diagnosis Date  . Anxiety   . Atrial fibrillation ([place])    a. s/p RFCA x 2 (10/26/2011, 04/15/2012)  . Atrial flutter ([place])   . Chronic kidney disease   . Current smoker   . 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 LA thrombus   . Non-ischemic cardiomyopathy ([place])   . OSA on CPAP   . Sinus trouble   . Sleep apnea   . Systolic heart failure ([facility])    EF 20-25%-> 50-55% in 10/2011. MOST RECENT ECHO (03/2012) EF= 66%    Past Surgical History:  Procedure Laterality Date  . ATRIAL ABLATION SURGERY    . CARDIAC CATHETERIZATION  08/2011   [facility]  . CHOLECYSTECTOMY    . FINGER SURGERY    . LEFT HEART CATHETERIZATION WITH CORONARY ANGIOGRAM N/A 09/12/2011   Procedure: LEFT HEART CATHETERIZATION WITH CORONARY ANGIOGRAM;  Surgeon: Hillary Bow, MD;  Location: [facility] CATH LAB;  Service: Cardiovascular;  Laterality: N/A;  . VASECTOMY      Current Outpatient Medications  Medication Sig Dispense Refill  .  dicyclomine (BENTYL) 10 MG capsule Take 1 capsule (10 mg total) by mouth every 4 (four) hours as needed for spasms. 20 capsule 2  . hyoscyamine (LEVSIN SL) 0.125 MG SL tablet Place 1 tablet (0.125 mg total) under the tongue every 6 (six) hours as needed. 20 tablet 0  . losartan (COZAAR) 50 MG tablet Take 50 mg by mouth daily.  6  . polyethylene glycol-electrolytes (NULYTELY/GOLYTELY) 420 g solution Take 4,000 mLs by mouth once for 1 dose. 4000 mL 0  . Rivaroxaban (XARELTO) 20 MG TABS Take 1 tablet by mouth daily.     No current facility-administered medications for this  visit.     Allergies as of 04/03/2017 - Review Complete 04/03/2017  Allergen Reaction Noted  . Amiodarone  01/03/2013    Family History  Problem Relation Age of Onset  . Atrial fibrillation Father        age 2  . Cancer Father        melanoma  . Healthy Mother        age 42  . Cancer Mother        melanoma  . Coronary artery disease Paternal Grandmother   . Heart disease Paternal Grandmother   . Heart attack Paternal Grandmother   . Coronary artery disease Paternal Grandfather   . Heart disease Paternal Grandfather   . Heart attack Paternal Grandfather   . Atrial fibrillation Paternal Aunt   . Atrial fibrillation Paternal Uncle     Social History   Socioeconomic History  . Marital status: Married    Spouse name: Not on file  . Number of children: Not on file  . Years of education: Not on file  . Highest education level: Not on file  Social Needs  . Financial resource strain: Not on file  . Food insecurity - worry: Not on file  . Food insecurity - inability: Not on file  . Transportation needs - medical: Not on file  . Transportation needs - non-medical: Not on file  Occupational History  . 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 Smoking status: Former Smoker    Packs/day: 7.00    Years: 20.00    Pack years: 140.00    Types: Cigarettes  . Smokeless tobacco: Never Used  Substance and Sexual Activity  . Alcohol use: Yes    Comment: drinks socially  . Drug use: No  . Sexual activity: Yes  Other Topics Concern  . Not on file  Social History Narrative   Has 2 children    Review of Systems:    Constitutional: No weight loss, fever or chills Skin: No rash  Cardiovascular: No chest pain  Respiratory: No SOB  Gastrointestinal: See HPI and otherwise negative Genitourinary: No dysuria Neurological: No headache, dizziness or syncope Musculoskeletal: No new muscle or joint pain Hematologic: No bleeding    Psychiatric: No history of depression or anxiety   Physical Exam:  Vital signs: BP 102/60   Pulse (!) 58   Ht 6' (1.829 m)   Wt 219 lb (99.3 kg)   BMI 29.70 kg/m   Constitutional:   Pleasant Caucasian male appears to be in NAD, Well developed, Well nourished, alert and cooperative Head:  Normocephalic and atraumatic. Eyes:   PEERL, EOMI. No icterus. Conjunctiva pink. Ears:  Normal auditory acuity. Neck:  Supple Throat: Oral cavity and pharynx without inflammation, swelling or lesion.  Respiratory: Respirations even and unlabored. Lungs clear to auscultation  bilaterally.   No wheezes, crackles, or rhonchi.  Cardiovascular: Normal S1, S2. No MRG. Regular rate and rhythm. No peripheral edema, cyanosis or pallor.  Gastrointestinal:  Soft, nondistended, mild LLQ ttp, No rebound or guarding. Normal bowel sounds. No appreciable masses or hepatomegaly. Rectal:  Not performed.  Msk:  Symmetrical without gross deformities. Without edema, no deformity or joint abnormality.  Neurologic:  Alert and  oriented x4;  grossly normal neurologically.  Skin:   Dry and intact without significant lesions or rashes. Psychiatric: Demonstrates good judgement and reason without abnormal affect or behaviors.  MOST RECENT LABS AND IMAGING: CBC    Component Value Date/Time   WBC 6.6 03/29/2017 1045   WBC 15.9 (H) 12/24/2015 1335   RBC 4.74 03/29/2017 1045   RBC 3.69 (L) 12/24/2015 1335   HGB 14.5 03/29/2017 1045   HCT 40.4 03/29/2017 1045   PLT 264 03/29/2017 1045   MCV 85 03/29/2017 1045   MCH 30.6 03/29/2017 1045   MCH 31.2 12/24/2015 1335   MCHC 35.9 (H) 03/29/2017 1045   MCHC 35.1 12/24/2015 1335   RDW 13.3 03/29/2017 1045   LYMPHSABS 1.5 03/29/2017 1045   MONOABS 1.2 (H) 12/24/2015 1335   EOSABS 0.4 03/29/2017 1045   BASOSABS 0.0 03/29/2017 1045    CMP     Component Value Date/Time   NA 140 03/29/2017 1045   K 5.2 03/29/2017 1045   CL 100 03/29/2017 1045   CO2 25 03/29/2017 1045    GLUCOSE 96 03/29/2017 1045   GLUCOSE 100 (H) 12/24/2015 1335   BUN 18 03/29/2017 1045   CREATININE 1.66 (H) 03/29/2017 1045   CALCIUM 10.0 03/29/2017 1045   PROT 7.7 03/29/2017 1045   ALBUMIN 5.2 03/29/2017 1045   AST 18 03/29/2017 1045   ALT 15 03/29/2017 1045   ALKPHOS 57 03/29/2017 1045   BILITOT 0.6 03/29/2017 1045   GFRNONAA 48 (L) 03/29/2017 1045   GFRAA 56 (L) 03/29/2017 1045   EXAM: CT ABDOMEN AND PELVIS WITH CONTRAST 03/29/17  TECHNIQUE: Multidetector CT imaging of the abdomen and pelvis was performed using the standard protocol following bolus administration of intravenous contrast.  CONTRAST:  100 mL Isovue-300 IV  COMPARISON:  12/24/2015  FINDINGS: Lower chest: Subpleural nodule in the left lower lobe measures 5 mm, unchanged since prior study compatible with benign nodules. No effusions. Heart is normal size.  Hepatobiliary: No focal liver abnormality is seen. Status post cholecystectomy. No biliary dilatation.  Pancreas: No focal abnormality or ductal dilatation.  Spleen: No focal abnormality.  Normal size.  Adrenals/Urinary Tract: No adrenal abnormality. No focal renal abnormality. No stones or hydronephrosis. Urinary bladder is unremarkable.  Stomach/Bowel: Slight inflammation noted adjacent to the sigmoid colon, surrounding a rounded fat density. I favor this represents epiploic appendagitis rather than diverticulitis. Appendix is normal. Stomach and small bowel decompressed, unremarkable.  Vascular/Lymphatic: No evidence of aneurysm or adenopathy.  Reproductive: No visible focal abnormality.  Other: No free fluid or free air.  Musculoskeletal: No acute bony abnormality.  IMPRESSION: Slight inflammation adjacent to the sigmoid colon, surrounding rounded fat density adjacent to the sigmoid colon. I favor this represents epiploic appendagitis rather than diverticulitis. The underlying bowel wall does not appear thickened or  abnormal.  Prior cholecystectomy.   Electronically Signed   By: Rolm Baptise M.D.   On: 03/29/2017 18:02  Assessment: 1.  Left lower quadrant pain: First diagnosis of diverticulitis via CT 12/24/15, 5 episodes since then per patient's wife, most recently CT shows suspected epiploic  appendicitis which sounds as though this is resolving on its own; concern for underlying pathology versus diverticular disease 2. History of diverticulitis  Plan: 1.  Scheduled patient for colonoscopy with Dr. Ardis Hughs in the [facility].  Did discuss risks, benefits, limitations and alternatives and the patient agrees to proceed. 2.  Patient was advised to hold his Xarelto for 24 hours prior to time of his procedure.  We will communicate with his cardiologist to ensure that holding his Xarelto is acceptable for him. 3.  Would recommend a low fiber/ low residue diet for the next week while patient has lingering left lower quadrant pain. Provided him with a handout.  Did discuss high-fiber recommendations at other times. 4.  Prescribed Bentyl 10 mg every 4-6 hours as needed for lingering left lower quadrant pain. 5.  Patient to follow in clinic per recommendations from Dr. Ardis Hughs after time of procedure.  Did discuss with patient that if he has any increasing left lower quadrant pain or starts seeing rectal bleeding or has other symptoms he should alert our office.  Ellouise Newer, PA-C [facility] 04/03/2017, 12:33 PM  Cc: Flinchum, Kelby Aline, F*

## 2017-04-03 NOTE — Telephone Encounter (Signed)
Called Dr. Kern Alberta office at [facility] practice to ask if patient could be off Xarelto. Left message with nurse  She faxed back response ok to hold Xarelto one day before the procedure. Informed patients wife that he should take his last dose on 04-04-17 and then resume it after the procedure per the providers discharge instructions.     Will send fax from Dr. Olena Heckle office to be scanned into chart.

## 2017-04-03 NOTE — Patient Instructions (Signed)
We have sent the following medications to your pharmacy for you to pick up at your convenience: Hyoscyamine 0.125 mg every 4-6 hours   We have given you a low fiber/low handout.   You have been scheduled for a colonoscopy. Please follow written instructions given to you at your visit today.  Please pick up your prep supplies at the pharmacy within the next 1-3 days. If you use inhalers (even only as needed), please bring them with you on the day of your procedure. Your physician has requested that you go to www.startemmi.com and enter the access code given to you at your visit today. This web site gives a general overview about your procedure. However, you should still follow specific instructions given to you by our office regarding your preparation for the procedure.

## 2017-04-06 ENCOUNTER — Other Ambulatory Visit: Payer: Self-pay

## 2017-04-06 ENCOUNTER — Encounter: Payer: Self-pay | Admitting: Gastroenterology

## 2017-04-06 ENCOUNTER — Ambulatory Visit (AMBULATORY_SURGERY_CENTER): Payer: BLUE CROSS/BLUE SHIELD | Admitting: Gastroenterology

## 2017-04-06 VITALS — BP 112/66 | HR 41 | Temp 96.8°F | Resp 13 | Ht 72.0 in | Wt 219.0 lb

## 2017-04-06 DIAGNOSIS — K5730 Diverticulosis of large intestine without perforation or abscess without bleeding: Secondary | ICD-10-CM | POA: Diagnosis not present

## 2017-04-06 DIAGNOSIS — D122 Benign neoplasm of ascending colon: Secondary | ICD-10-CM

## 2017-04-06 DIAGNOSIS — R1032 Left lower quadrant pain: Secondary | ICD-10-CM

## 2017-04-06 DIAGNOSIS — D123 Benign neoplasm of transverse colon: Secondary | ICD-10-CM | POA: Diagnosis not present

## 2017-04-06 DIAGNOSIS — Z1211 Encounter for screening for malignant neoplasm of colon: Secondary | ICD-10-CM | POA: Diagnosis not present

## 2017-04-06 MED ORDER — SODIUM CHLORIDE 0.9 % IV SOLN: 500.0000 mL | Freq: Once | INTRAVENOUS | Status: DC

## 2017-04-06 NOTE — Progress Notes (Signed)
A and O x3. Report to RN. Tolerated MAC anesthesia well.

## 2017-04-06 NOTE — Patient Instructions (Signed)
YOU HAD AN ENDOSCOPIC PROCEDURE TODAY AT [facility]:   Refer to the procedure report that was given to you for any specific questions about what was found during the examination.  If the procedure report does not answer your questions, please call your gastroenterologist to clarify.  If you requested that your care partner not be given the details of your procedure findings, then the procedure report has been included in a sealed envelope for you to review at your convenience later.  YOU SHOULD EXPECT: Some feelings of bloating in the abdomen. Passage of more gas than usual.  Walking can help get rid of the air that was put into your GI tract during the procedure and reduce the bloating. If you had a lower endoscopy (such as a colonoscopy or flexible sigmoidoscopy) you may notice spotting of blood in your stool or on the toilet paper. If you underwent a bowel prep for your procedure, you may not have a normal bowel movement for a few days.  Please Note:  You might notice some irritation and congestion in your nose or some drainage.  This is from the oxygen used during your procedure.  There is no need for concern and it should clear up in a day or so.  SYMPTOMS TO REPORT IMMEDIATELY:   Following lower endoscopy (colonoscopy or flexible sigmoidoscopy):  Excessive amounts of blood in the stool  Significant tenderness or worsening of abdominal pains  Swelling of the abdomen that is new, acute  Fever of 100F or higher  Please see handouts given to you on polyps and Diverticulosis. You may resume your xarelto tomorrow.    For urgent or emergent issues, a gastroenterologist can be reached at any hour by calling [phone].   DIET:  We do recommend a small meal at first, but then you may proceed to your regular diet.  Drink plenty of fluids but you should avoid alcoholic beverages for 24 hours.  ACTIVITY:  You should plan to take it easy for the rest of today and you should NOT  DRIVE or use heavy machinery until tomorrow (because of the sedation medicines used during the test).    FOLLOW UP: Our staff will call the number listed on your records the next business day following your procedure to check on you and address any questions or concerns that you may have regarding the information given to you following your procedure. If we do not reach you, we will leave a message.  However, if you are feeling well and you are not experiencing any problems, there is no need to return our call.  We will assume that you have returned to your regular daily activities without incident.  If any biopsies were taken you will be contacted by phone or by letter within the next 1-3 weeks.  Please call us at [phone] if you have not heard about the biopsies in 3 weeks.    SIGNATURES/CONFIDENTIALITY: You and/or your care partner have signed paperwork which will be entered into your electronic medical record.  These signatures attest to the fact that that the information above on your After Visit Summary has been reviewed and is understood.  Full responsibility of the confidentiality of this discharge information lies with you and/or your care-partner.  Thank you for letting us take care of your healthcare needs today.

## 2017-04-06 NOTE — Progress Notes (Signed)
Called to room to assist during endoscopic procedure.  Patient ID and intended procedure confirmed with present staff. Received instructions for my participation in the procedure from the performing physician.  

## 2017-04-06 NOTE — Op Note (Signed)
[place] Patient Name: Jerry Richards Procedure Date: 04/06/2017 9:52 AM MRN: [id number] Endoscopist: Milus Banister , MD Age: 47 Referring MD:  Date of Birth: 24-Nov-1970 Gender: Male Account #: [id number] Procedure:                Colonoscopy Indications:              Abdominal pain in the left lower quadrant;                            diverticulitis per 2017 CT, epiploic appendigitis                            per 2019 CT Medicines:                Monitored Anesthesia Care Procedure:                Pre-Anesthesia Assessment:                           - Prior to the procedure, a History and Physical                            was performed, and patient medications and                            allergies were reviewed. The patient's tolerance of                            previous anesthesia was also reviewed. The risks                            and benefits of the procedure and the sedation                            options and risks were discussed with the patient.                            All questions were answered, and informed consent                            was obtained. Prior Anticoagulants: The patient has                            taken Xarelto (rivaroxaban), last dose was 2 days                            prior to procedure. ASA Grade Assessment: III - A                            patient with severe systemic disease. After                            reviewing the risks and benefits, the patient was  deemed in satisfactory condition to undergo the                            procedure.                           After obtaining informed consent, the colonoscope                            was passed under direct vision. Throughout the                            procedure, the patient's blood pressure, pulse, and                            oxygen saturations were monitored continuously. The                            Colonoscope  was introduced through the anus and                            advanced to the the cecum, identified by                            appendiceal orifice and ileocecal valve. The                            colonoscopy was performed without difficulty. The                            patient tolerated the procedure well. The quality                            of the bowel preparation was good. The ileocecal                            valve, appendiceal orifice, and rectum were                            photographed. Scope In: 10:01:42 AM Scope Out: 10:11:49 AM Scope Withdrawal Time: 0 hours 8 minutes 53 seconds  Total Procedure Duration: 0 hours 10 minutes 7 seconds  Findings:                 Four semi-pedunculated polyps were found in the                            transverse colon and ascending colon. The polyps                            were 6 to 8 mm in size. These polyps were removed                            with a cold snare. Resection and retrieval were  complete.                           A few small-mouthed diverticula were found in the                            left colon (without signs of chronic inflamation).                           The exam was otherwise without abnormality on                            direct and retroflexion views. Complications:            No immediate complications. Estimated blood loss:                            None. Estimated Blood Loss:     Estimated blood loss: none. Impression:               - Four 6 to 8 mm polyps in the transverse colon and                            in the ascending colon, removed with a cold snare.                            Resected and retrieved.                           - Diverticulosis in the left colon (without signs                            of chronic inflamation).                           - The examination was otherwise normal on direct                            and retroflexion  views. Recommendation:           - Patient has a contact number available for                            emergencies. The signs and symptoms of potential                            delayed complications were discussed with the                            patient. Return to normal activities tomorrow.                            Written discharge instructions were provided to the                            patient.                           -  Resume previous diet.                           - Continue present medications.                           - Please call Dr. Ardis Hughs' office if you have repeat                            severe left sided abdominal pains again.                           You will receive a letter within 2-3 weeks with the                            pathology results and my final recommendations.                           If the polyp(s) is proven to be 'pre-cancerous' on                            pathology, you will need repeat colonoscopy in 3-5                            years. If the polyp(s) is NOT 'precancerous' on                            pathology then you should repeat colon cancer                            screening in 10 years with colonoscopy without need                            for colon cancer screening by any method prior to                            then (including stool testing). Milus Banister, MD 04/06/2017 10:18:56 AM This report has been signed electronically.

## 2017-04-09 ENCOUNTER — Telehealth: Payer: Self-pay

## 2017-04-09 NOTE — Telephone Encounter (Signed)
  Follow up Call-  Call back number 04/06/2017  Post procedure Call Back phone  # [phone]  Permission to leave phone message Yes  Some recent data might be hidden     Patient questions:  Do you have a fever, pain , or abdominal swelling? No. Pain Score  0 *  Have you tolerated food without any problems? Yes.    Have you been able to return to your normal activities? Yes.    Do you have any questions about your discharge instructions: Diet   No. Medications  No. Follow up visit  No.  Do you have questions or concerns about your Care? No.  Actions: * If pain score is 4 or above: No action needed, pain <4.

## 2017-04-11 ENCOUNTER — Encounter: Payer: Self-pay | Admitting: Gastroenterology

## 2017-04-12 ENCOUNTER — Telehealth: Payer: Self-pay

## 2017-04-19 NOTE — Progress Notes (Signed)
He was following up with Nerphrology and [name], MD.

## 2017-04-24 MED FILL — XARELTO 20 MG TABLET: 20 | 30 days supply | Qty: 30 | Fill #2

## 2017-05-15 DIAGNOSIS — N028 Recurrent and persistent hematuria with other morphologic changes: Secondary | ICD-10-CM | POA: Diagnosis not present

## 2017-05-15 DIAGNOSIS — Z9189 Other specified personal risk factors, not elsewhere classified: Secondary | ICD-10-CM | POA: Diagnosis not present

## 2017-05-15 DIAGNOSIS — N183 Chronic kidney disease, stage 3 (moderate): Secondary | ICD-10-CM | POA: Diagnosis not present

## 2017-05-21 MED FILL — XARELTO 20 MG TABLET: 20 | 30 days supply | Qty: 30 | Fill #3

## 2017-05-22 DIAGNOSIS — Z6828 Body mass index (BMI) 28.0-28.9, adult: Secondary | ICD-10-CM | POA: Diagnosis not present

## 2017-05-22 DIAGNOSIS — I480 Paroxysmal atrial fibrillation: Secondary | ICD-10-CM | POA: Diagnosis not present

## 2017-05-22 DIAGNOSIS — Z1389 Encounter for screening for other disorder: Secondary | ICD-10-CM | POA: Diagnosis not present

## 2017-05-22 DIAGNOSIS — N028 Recurrent and persistent hematuria with other morphologic changes: Secondary | ICD-10-CM | POA: Diagnosis not present

## 2017-05-22 DIAGNOSIS — N183 Chronic kidney disease, stage 3 (moderate): Secondary | ICD-10-CM | POA: Diagnosis not present

## 2017-06-27 MED FILL — XARELTO 20 MG TABLET: 20 | 30 days supply | Qty: 30 | Fill #0

## 2017-07-06 ENCOUNTER — Encounter: Payer: Self-pay | Admitting: Adult Health

## 2017-07-06 ENCOUNTER — Ambulatory Visit: Payer: Self-pay | Admitting: Adult Health

## 2017-07-06 VITALS — BP 112/66 | HR 60 | Temp 99.0°F | Resp 16 | Wt 216.0 lb

## 2017-07-06 DIAGNOSIS — S058X2A Other injuries of left eye and orbit, initial encounter: Secondary | ICD-10-CM

## 2017-07-06 MED ORDER — GENTAMICIN SULFATE 0.3 % OP SOLN: 2.0000 [drp] | Freq: Three times a day (TID) | OPHTHALMIC | 0 refills | Status: DC

## 2017-07-06 NOTE — Progress Notes (Signed)
Subjective:    Patient ID: Jerry Richards, male    DOB: 1970-10-05, 47 y.o.   MRN: [id number]   Blood pressure 112/66, pulse 60, temperature 99 F (37.2 C), temperature source Tympanic, resp. rate 16, weight 216 lb (98 kg), SpO2 100 %.  HPI Patient is a 47 year old male in no acute distress who comes to the clinic for left eye irritation since Wednesday 07/04/17, he reports itching and irritation left outer corner of eye.   He denied getting anything into his eye reports he is actively working outside at [employer] and also always getting in the dirt.  Denies eye drainage. Denies any injury. Denies any vision change. Denies eye pain.  Patient  denies any fever, body aches,chills, rash, chest pain, shortness of breath, nausea, vomiting, or diarrhea.  Denies any recent exposures.   Review of Systems  Constitutional: Negative.   HENT: Negative.   Eyes: Positive for itching. Negative for photophobia, pain, discharge, redness and visual disturbance.  Respiratory: Negative.   Cardiovascular: Negative.   Gastrointestinal: Negative.   Musculoskeletal: Negative.   Neurological: Negative.   Hematological: Negative.   Psychiatric/Behavioral: Negative.        Objective:   Physical Exam  Constitutional: He is oriented to person, place, and time. Vital signs are normal. He appears well-developed and well-nourished. He is active.  Non-toxic appearance. He does not have a sickly appearance. He does not appear ill. No distress.  HENT:  Head: Normocephalic and atraumatic.  Right Ear: Hearing, tympanic membrane, external ear and ear canal normal.  Left Ear: Hearing, tympanic membrane, external ear and ear canal normal.  Nose: Nose normal. Right sinus exhibits no maxillary sinus tenderness and no frontal sinus tenderness. Left sinus exhibits no maxillary sinus tenderness and no frontal sinus tenderness.  Mouth/Throat: Uvula is midline, oropharynx is clear and moist and mucous membranes are normal.  Mucous membranes are not pale, not dry and not cyanotic. He does not have dentures. Normal dentition. No tonsillar exudate.  Eyes: Pupils are equal, round, and reactive to light. EOM and lids are normal. Lids are everted and swept, no foreign bodies found. Right eye exhibits no discharge. Left eye exhibits no discharge. Right conjunctiva is not injected. Right conjunctiva has no hemorrhage. Left conjunctiva is injected (mild pink conjunctiva left lateral eye/ corneal abrasion marked on eye diagram ). Left conjunctiva has no hemorrhage. No scleral icterus. Right eye exhibits normal extraocular motion and no nystagmus. Left eye exhibits normal extraocular motion and no nystagmus. Right pupil is round and reactive. Left pupil is round and reactive. Pupils are equal.  Fundoscopic exam:      The right eye shows no arteriolar narrowing. The right eye shows no venous pulsations.       The left eye shows no arteriolar narrowing, no AV nicking, no exudate and no hemorrhage. The left eye shows no venous pulsations.  Slit lamp exam:      The left eye shows fluorescein uptake. The left eye shows no corneal flare, no corneal ulcer, no foreign body, no hyphema and no hypopyon.    Area marked on diagram small very superficial corneal abrasion does not go to cornea.   Neck: Normal range of motion. Neck supple.  Cardiovascular: Normal rate, regular rhythm, normal heart sounds and intact distal pulses. Exam reveals no gallop and no friction rub.  No murmur heard. Pulmonary/Chest: Effort normal and breath sounds normal. No stridor. No respiratory distress. He has no wheezes. He has no rales.  He exhibits no tenderness.  Abdominal: Soft. Bowel sounds are normal. He exhibits no distension and no mass. There is no tenderness. There is no rebound and no guarding. No hernia.  Musculoskeletal: Normal range of motion.  Neurological: He is alert and oriented to person, place, and time.  Skin: Skin is warm. Capillary refill  takes less than 2 seconds. No rash noted. He is not diaphoretic. No erythema. No pallor.  Vitals reviewed.         Assessment & Plan:   Abrasion of sclera of left eye, initial encounter   Meds ordered this encounter  Medications  . gentamicin (GARAMYCIN) 0.3 % ophthalmic solution    Sig: Place 2 drops into the left eye 3 (three) times daily.    Dispense:  5 mL    Refill:  0    Claritin daily per package instructions. Artificial Tears per package instructions - just not within 1 hour after antibiotic eye drops.  Recheck left eye in one week.   Advised patient call the office or your primary care doctor for an appointment if no improvement within 72 hours or if any symptoms change or worsen at any time  Advised ER or urgent Care if after hours or on weekend. Call 911 for emergency symptoms at any time.Patinet verbalized understanding of all instructions given/reviewed and treatment plan and has no further questions or concerns at this time.   Patient verbalized understanding of all instructions given and denies any further questions at this time.   Marland Kitchen

## 2017-07-06 NOTE — Patient Instructions (Addendum)
A corneal abrasion is a scratch or injury to the clear covering over the front of your eye (cornea). Your cornea forms a clear dome that protects your eye and helps to focus your vision. Your cornea is made up of many layers. The surface layer is a single layer of cells (corneal epithelium). It is one of the most sensitive tissues in your body. A corneal abrasion can be very painful. If a corneal abrasion is not treated, it can become infected and cause an ulcer. This can lead to scarring. A scarred cornea can affect your vision. Sometimes abrasions come back in the same area, even after the original injury has healed (recurrent erosion syndrome). What are the causes? This condition may be caused by:  A poke in the eye.  A gritty or irritating substance (foreign body) in the eye.  Excessive eye rubbing.  Very dry eyes.  Certain eye infections.  Contact lenses that fit poorly or are worn for a long period of time. You can also injure your cornea when putting contacts lenses in your eye or taking them out.  Eye surgery.  Sometimes, the cause is unknown. What are the signs or symptoms? Symptoms of this condition include:  Eye pain. The pain may get worse when your eye is open or when you move your eye.  A feeling of something stuck in your eye.  Having trouble keeping your eye open, or not being able to keep it open.  Tearing and redness.  Sensitivity to light.  Blurred vision.  Headache.  How is this diagnosed? This condition may be diagnosed based on:  Your medical history.  Your symptoms.  An eye exam. You may work with a health care provider who specializes in diseases and conditions of the eye (ophthalmologist). Before the eye exam, numbing drops may be put into your eye. You may also have dye put in your eye with a dropper or a small paper strip. The dye makes the abrasion easy to see when your ophthalmologist examines your eye with a light. Your ophthalmologist  may look at your eye through an eye scope (slit lamp).  How is this treated? Treatment may vary depending on the cause of your condition, and it may include:  Washing out your eye.  Removing any foreign body.  Antibiotic drops or ointment to treat an infection.  Steroid drops or ointment to treat redness, irritation, or inflammation.  Pain medicine.  An eye patch to keep your eye closed.  Follow these instructions at home: Medicines  Use eye drops or ointments as told by your eye care provider.  If you were prescribed antibiotic drops or ointment, use them as told by your eye care provider. Do not stop using the antibiotic even if you start to feel better.  Take over-the-counter and prescription medicines only as told by your eye care provider.  Do not drive or use heavy machinery while taking prescription pain medicine. General instructions  If you have an eye patch, wear it as told by your eye care provider. ? Do not drive or use machinery while wearing an eye patch. Your ability to judge distances will be impaired. ? Follow instructions from your eye care provider about when to remove the patch.  Ask your eye care provider whether you can use a cold, wet cloth (compress) on your eye to relieve pain.  Do not rub or touch your eye. Do not wash out your eye.  Do not wear contact lenses until  your eye care provider says that this is okay.  Avoid bright light and eye strain.  Keep all follow-up visits as told by your eye care provider. This is important for preventing infection and vision loss. Contact a health care provider if:  You continue to have eye pain and other symptoms for more than 2 days.  You develop new symptoms, such as redness, tearing, or discharge.  You have discharge that makes your eyelids stick together in the morning.  Your eye patch becomes so loose that you can blink your eye.  Symptoms return after the original abrasion has healed. Get help  right away if:  You have severe eye pain that does not get better with medicine.  You have vision loss. Summary  A corneal abrasion is a scratch on the outer layer of the clear covering over the front of your eye (cornea).  Corneal abrasion can cause eye pain, redness, tearing, and blurred vision.  This condition is usually treated with medicine to prevent infection and scarring. You also may have to wear an eye patch to cover your eye.  Let your eye care provider know if your symptoms continue for more than 2 days. This information is not intended to replace advice given to you by your health care provider. Make sure you discuss any questions you have with your health care provider. Document Released: 02/11/2000 Document Revised: 01/25/2016 Document Reviewed: 01/25/2016 Elsevier Interactive Patient Education  [employer].

## 2017-07-24 MED FILL — XARELTO 20 MG TABLET: 20 | 30 days supply | Qty: 30 | Fill #1

## 2017-08-21 MED FILL — XARELTO 20 MG TABLET: 20 | 30 days supply | Qty: 30 | Fill #2

## 2017-09-25 MED FILL — XARELTO 20 MG TABLET: 20 | 30 days supply | Qty: 30 | Fill #3

## 2017-10-05 DIAGNOSIS — Z6829 Body mass index (BMI) 29.0-29.9, adult: Secondary | ICD-10-CM | POA: Diagnosis not present

## 2017-10-05 DIAGNOSIS — N028 Recurrent and persistent hematuria with other morphologic changes: Secondary | ICD-10-CM | POA: Diagnosis not present

## 2017-10-05 DIAGNOSIS — I10 Essential (primary) hypertension: Secondary | ICD-10-CM | POA: Diagnosis not present

## 2017-10-05 DIAGNOSIS — N183 Chronic kidney disease, stage 3 (moderate): Secondary | ICD-10-CM | POA: Diagnosis not present

## 2017-10-22 MED FILL — XARELTO 20 MG TABLET: 20 | 30 days supply | Qty: 30 | Fill #0

## 2017-11-22 DIAGNOSIS — N183 Chronic kidney disease, stage 3 (moderate): Secondary | ICD-10-CM | POA: Diagnosis not present

## 2017-11-22 DIAGNOSIS — I480 Paroxysmal atrial fibrillation: Secondary | ICD-10-CM | POA: Diagnosis not present

## 2017-11-22 DIAGNOSIS — Z9189 Other specified personal risk factors, not elsewhere classified: Secondary | ICD-10-CM | POA: Diagnosis not present

## 2017-11-22 DIAGNOSIS — M791 Myalgia, unspecified site: Secondary | ICD-10-CM | POA: Diagnosis not present

## 2017-11-26 MED FILL — XARELTO 20 MG TABLET: 20 | 30 days supply | Qty: 30 | Fill #1

## 2017-11-27 DIAGNOSIS — Z23 Encounter for immunization: Secondary | ICD-10-CM | POA: Diagnosis not present

## 2017-11-27 DIAGNOSIS — N183 Chronic kidney disease, stage 3 (moderate): Secondary | ICD-10-CM | POA: Diagnosis not present

## 2017-11-27 DIAGNOSIS — I480 Paroxysmal atrial fibrillation: Secondary | ICD-10-CM | POA: Diagnosis not present

## 2017-11-27 DIAGNOSIS — Z9189 Other specified personal risk factors, not elsewhere classified: Secondary | ICD-10-CM | POA: Diagnosis not present

## 2017-11-27 DIAGNOSIS — N028 Recurrent and persistent hematuria with other morphologic changes: Secondary | ICD-10-CM | POA: Diagnosis not present

## 2017-12-28 MED FILL — XARELTO 20 MG TABLET: 20 | 30 days supply | Qty: 30 | Fill #2

## 2018-01-29 MED FILL — XARELTO 20 MG TABLET: 20 | 30 days supply | Qty: 30 | Fill #3

## 2018-01-31 DIAGNOSIS — C44319 Basal cell carcinoma of skin of other parts of face: Secondary | ICD-10-CM | POA: Diagnosis not present

## 2018-01-31 DIAGNOSIS — C44311 Basal cell carcinoma of skin of nose: Secondary | ICD-10-CM | POA: Diagnosis not present

## 2018-03-06 MED FILL — XARELTO 20 MG TABLET: 20 | 30 days supply | Qty: 30 | Fill #0

## 2018-03-13 DIAGNOSIS — C4401 Basal cell carcinoma of skin of lip: Secondary | ICD-10-CM | POA: Diagnosis not present

## 2018-04-01 MED FILL — XARELTO 20 MG TABLET: 20 | 30 days supply | Qty: 30 | Fill #1

## 2018-04-24 DIAGNOSIS — N189 Chronic kidney disease, unspecified: Secondary | ICD-10-CM | POA: Diagnosis not present

## 2018-04-24 DIAGNOSIS — D631 Anemia in chronic kidney disease: Secondary | ICD-10-CM | POA: Diagnosis not present

## 2018-04-24 DIAGNOSIS — N2581 Secondary hyperparathyroidism of renal origin: Secondary | ICD-10-CM | POA: Diagnosis not present

## 2018-04-24 DIAGNOSIS — N183 Chronic kidney disease, stage 3 (moderate): Secondary | ICD-10-CM | POA: Diagnosis not present

## 2018-04-24 DIAGNOSIS — N028 Recurrent and persistent hematuria with other morphologic changes: Secondary | ICD-10-CM | POA: Diagnosis not present

## 2018-05-04 MED FILL — XARELTO 20 MG TABLET: 20 | 30 days supply | Qty: 30 | Fill #2 | Status: TO

## 2018-05-29 DIAGNOSIS — Z0000 Encounter for general adult medical examination without abnormal findings: Secondary | ICD-10-CM | POA: Diagnosis not present

## 2018-05-31 DIAGNOSIS — G43909 Migraine, unspecified, not intractable, without status migrainosus: Secondary | ICD-10-CM | POA: Diagnosis not present

## 2018-05-31 DIAGNOSIS — I480 Paroxysmal atrial fibrillation: Secondary | ICD-10-CM | POA: Diagnosis not present

## 2018-05-31 DIAGNOSIS — N028 Recurrent and persistent hematuria with other morphologic changes: Secondary | ICD-10-CM | POA: Diagnosis not present

## 2018-05-31 DIAGNOSIS — N183 Chronic kidney disease, stage 3 (moderate): Secondary | ICD-10-CM | POA: Diagnosis not present

## 2018-06-06 MED FILL — XARELTO 20 MG TABLET: 20 | 30 days supply | Qty: 30 | Fill #0

## 2018-07-11 MED FILL — XARELTO 20 MG TABLET: 20 | 30 days supply | Qty: 30 | Fill #0

## 2018-08-13 MED FILL — XARELTO 20 MG TABLET: 20 | 30 days supply | Qty: 30 | Fill #0

## 2018-08-16 DIAGNOSIS — N183 Chronic kidney disease, stage 3 (moderate): Secondary | ICD-10-CM | POA: Diagnosis not present

## 2018-08-16 DIAGNOSIS — D631 Anemia in chronic kidney disease: Secondary | ICD-10-CM | POA: Diagnosis not present

## 2018-08-16 DIAGNOSIS — N189 Chronic kidney disease, unspecified: Secondary | ICD-10-CM | POA: Diagnosis not present

## 2018-08-16 DIAGNOSIS — N028 Recurrent and persistent hematuria with other morphologic changes: Secondary | ICD-10-CM | POA: Diagnosis not present

## 2018-08-16 DIAGNOSIS — N2581 Secondary hyperparathyroidism of renal origin: Secondary | ICD-10-CM | POA: Diagnosis not present

## 2018-09-10 MED FILL — XARELTO 20 MG TABLET: 20 | 30 days supply | Qty: 30 | Fill #1

## 2018-10-15 MED FILL — XARELTO 20 MG TABLET: 20 | 30 days supply | Qty: 30 | Fill #2

## 2018-11-18 MED FILL — XARELTO 20 MG TABLET: 20 | 30 days supply | Qty: 30 | Fill #0

## 2018-11-26 DIAGNOSIS — Z0001 Encounter for general adult medical examination with abnormal findings: Secondary | ICD-10-CM | POA: Diagnosis not present

## 2018-11-28 ENCOUNTER — Ambulatory Visit: Payer: Self-pay

## 2018-11-28 ENCOUNTER — Other Ambulatory Visit: Payer: Self-pay

## 2018-11-28 DIAGNOSIS — Z23 Encounter for immunization: Secondary | ICD-10-CM

## 2018-11-29 DIAGNOSIS — Z0001 Encounter for general adult medical examination with abnormal findings: Secondary | ICD-10-CM | POA: Diagnosis not present

## 2018-11-29 DIAGNOSIS — G43909 Migraine, unspecified, not intractable, without status migrainosus: Secondary | ICD-10-CM | POA: Diagnosis not present

## 2018-11-29 DIAGNOSIS — Z23 Encounter for immunization: Secondary | ICD-10-CM | POA: Diagnosis not present

## 2018-11-29 DIAGNOSIS — N028 Recurrent and persistent hematuria with other morphologic changes: Secondary | ICD-10-CM | POA: Diagnosis not present

## 2018-11-29 DIAGNOSIS — N189 Chronic kidney disease, unspecified: Secondary | ICD-10-CM | POA: Diagnosis not present

## 2018-11-29 DIAGNOSIS — I480 Paroxysmal atrial fibrillation: Secondary | ICD-10-CM | POA: Diagnosis not present

## 2018-12-18 MED FILL — XARELTO 20 MG TABLET: 20 | 30 days supply | Qty: 30 | Fill #1

## 2018-12-27 ENCOUNTER — Telehealth: Payer: Self-pay

## 2018-12-27 NOTE — Telephone Encounter (Signed)
Notes on file from [facility] [phone], sent referral to scheduling

## 2019-01-10 DIAGNOSIS — Z20828 Contact with and (suspected) exposure to other viral communicable diseases: Secondary | ICD-10-CM | POA: Diagnosis not present

## 2019-01-21 MED FILL — XARELTO 20 MG TABLET: 20 | 30 days supply | Qty: 30 | Fill #2

## 2019-02-12 NOTE — Progress Notes (Signed)
CARDIOLOGY CONSULT NOTE       Patient ID: Jerry Richards MRN: [id number] DOB/AGE: [birth year] 48 y.o.  Admit date: (Not on file) Referring Physician: Quillian Quince Primary Physician: [name], MD Primary Cardiologist: Thompson Grayer  Reason for Consultation: Afib  Active Problems:   * No active hospital problems. *   HPI:  48 y.o. history of anxiety , CRF from IGA nephropathy stage 3 followed by [facility] Has history of Afib with multiple Ablation procedures done at [facility] Dr Rolland Porter 2014 in past Dr Olena Heckle note 11/29/18 indicated he is in NSR History of HTN likely related to his renovascular disease Lab review shows Cr 1.64 K 4.0 normal TSH Hct 42.7 PLT 202  LDL 74 Echo from 11/13/11 reviewed and showed EF 50-55% with mild MR and Mild LAE  Prior to ablations tried on a lot of AAT including amiodarone and failred Has had infrequent short bursts of what he calls flutter since October  Does use ETOH a bit including beer and [employer] with xarelto with no bleeding issues  Married with two children Works at [employer] has had taught trade skills in past   ROS All other systems reviewed and negative except as noted above  Past Medical History:  Diagnosis Date  . Anxiety   . Atrial fibrillation ([place])    a. s/p RFCA x 2 (10/26/2011, 04/15/2012)  . Atrial flutter ([place])   . Chronic kidney disease   . Clotting disorder ([place])   . Current smoker   . 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 LA thrombus   . Non-ischemic cardiomyopathy ([place])   . OSA on CPAP   . Sinus trouble   . Sleep apnea   . Systolic heart failure ([facility])    EF 20-25%-> 50-55% in 10/2011. MOST RECENT ECHO (03/2012) EF= 66%    Family History  Problem Relation Age of Onset  . Atrial fibrillation Father        age 50  . Cancer Father        melanoma  . Healthy Mother        age 50  . Cancer Mother        melanoma  . Coronary artery disease Paternal Grandmother     . Heart disease Paternal Grandmother   . Heart attack Paternal Grandmother   . Coronary artery disease Paternal Grandfather   . Heart disease Paternal Grandfather   . Heart attack Paternal Grandfather   . Atrial fibrillation Paternal Aunt   . Atrial fibrillation Paternal Uncle   . Diabetes Maternal Grandmother     Social History   Socioeconomic History  . Marital status: Married    Spouse name: Not on file  . Number of children: Not on file  . Years of education: Not on file  . Highest education level: Not on file  Occupational History  . 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 Smoking status: Former Smoker    Packs/day: 7.00    Years: 20.00    Pack years: 140.00    Types: Cigarettes  . Smokeless tobacco: Never Used  Substance and Sexual Activity  . Alcohol use: Yes    Comment: drinks socially  . Drug use: No  . Sexual activity: Yes  Other Topics Concern  . Not on file  Social History Narrative   Has 2 children   Social Determinants of  Health   Financial Resource Strain:   . Difficulty of Paying Living Expenses: Not on file  Food Insecurity:   . Worried About Charity fundraiser in the Last Year: Not on file  . Ran Out of Food in the Last Year: Not on file  Transportation Needs:   . Lack of Transportation (Medical): Not on file  . Lack of Transportation (Non-Medical): Not on file  Physical Activity:   . Days of Exercise per Week: Not on file  . Minutes of Exercise per Session: Not on file  Stress:   . Feeling of Stress : Not on file  Social Connections:   . Frequency of Communication with Friends and Family: Not on file  . Frequency of Social Gatherings with Friends and Family: Not on file  . Attends Religious Services: Not on file  . Active Member of Clubs or Organizations: Not on file  . Attends Archivist Meetings: Not on file  . Marital Status: Not on file  Intimate Partner Violence:   . Fear of  Current or Ex-Partner: Not on file  . Emotionally Abused: Not on file  . Physically Abused: Not on file  . Sexually Abused: Not on file    Past Surgical History:  Procedure Laterality Date  . ATRIAL ABLATION SURGERY    . CARDIAC CATHETERIZATION  08/2011   [facility]  . CHOLECYSTECTOMY    . FINGER SURGERY    . LEFT HEART CATHETERIZATION WITH CORONARY ANGIOGRAM N/A 09/12/2011   Procedure: LEFT HEART CATHETERIZATION WITH CORONARY ANGIOGRAM;  Surgeon: Hillary Bow, MD;  Location: [facility] CATH LAB;  Service: Cardiovascular;  Laterality: N/A;  . VASECTOMY        . sodium chloride      Physical Exam: There were no vitals taken for this visit.   Affect appropriate Healthy:  appears stated age 48: normal Neck supple with no adenopathy JVP normal no bruits no thyromegaly Lungs clear with no wheezing and good diaphragmatic motion Heart:  S1/S2 no murmur, no rub, gallop or click PMI normal Abdomen: benighn, BS positve, no tenderness, no AAA no bruit.  No HSM or HJR Distal pulses intact with no bruits No edema Neuro non-focal Skin warm and dry No muscular weakness   Labs:   Lab Results  Component Value Date   WBC 6.6 03/29/2017   HGB 14.5 03/29/2017   HCT 40.4 03/29/2017   MCV 85 03/29/2017   PLT 264 03/29/2017   No results for input(s): NA, K, CL, CO2, BUN, CREATININE, CALCIUM, PROT, BILITOT, ALKPHOS, ALT, AST, GLUCOSE in the last 168 hours.  Invalid input(s): LABALBU No results found for: CKTOTAL, CKMB, CKMBINDEX, TROPONINI No results found for: CHOL No results found for: HDL No results found for: LDLCALC No results found for: TRIG No results found for: CHOLHDL No results found for: LDLDIRECT    Radiology: No results found.  EKG: SB 55 vs junctional with short PR nonspecific ST changes    ASSESSMENT AND PLAN:   1. PAF: post multiple ablations [facility] Dr Rolland Porter 2014 sinus today Continue xarelto. He has infrequent short flairs That do not warrant change in Rx now.  Going forward would best be served by f/u with EP as future choice of AAT Would be difficult and any rate control would be complicated by low HR and ? Junctional rhythm now. He has multiple Relatives with PPM's. Will update echo for EF, degree of MR and atrial sizes 2. CRF:  F/u [place] [facility] baseline Cr around  1.64 3. HTN:  Well controlled.  Continue current medications and low sodium Dash type diet.     SignedJenkins Rouge 02/17/2019, 10:29 AM

## 2019-02-13 MED FILL — XARELTO 20 MG TABLET: 20 | 30 days supply | Qty: 30 | Fill #3

## 2019-02-18 ENCOUNTER — Ambulatory Visit: Payer: BLUE CROSS/BLUE SHIELD | Admitting: Cardiovascular Disease

## 2019-02-18 ENCOUNTER — Encounter: Payer: Self-pay | Admitting: Cardiovascular Disease

## 2019-02-18 ENCOUNTER — Other Ambulatory Visit: Payer: Self-pay

## 2019-02-18 VITALS — BP 112/70 | HR 54 | Temp 97.5°F | Ht 72.0 in | Wt 222.0 lb

## 2019-02-18 DIAGNOSIS — I4811 Longstanding persistent atrial fibrillation: Secondary | ICD-10-CM

## 2019-02-18 DIAGNOSIS — I4891 Unspecified atrial fibrillation: Secondary | ICD-10-CM

## 2019-02-18 NOTE — Patient Instructions (Addendum)
Medication Instructions:  Your physician recommends that you continue on your current medications as directed. Please refer to the Current Medication list given to you today.   Labwork: none  Testing/Procedures: Your physician has requested that you have an echocardiogram. Echocardiography is a painless test that uses sound waves to create images of your heart. It provides your doctor with information about the size and shape of your heart and how well your heart's chambers and valves are working. This procedure takes approximately one hour. There are no restrictions for this procedure.    Follow-Up: Your physician recommends that you schedule a follow-up appointment in: WITH DR. Lovena Le   Any Other Special Instructions Will Be Listed Below (If Applicable).  You have been referred to [place]      If you need a refill on your cardiac medications before your next appointment, please call your pharmacy.

## 2019-03-04 ENCOUNTER — Encounter: Payer: Self-pay | Admitting: Internal Medicine

## 2019-03-04 ENCOUNTER — Ambulatory Visit (INDEPENDENT_AMBULATORY_CARE_PROVIDER_SITE_OTHER): Payer: BC Managed Care – PPO | Admitting: Internal Medicine

## 2019-03-04 ENCOUNTER — Other Ambulatory Visit: Payer: Self-pay

## 2019-03-04 ENCOUNTER — Ambulatory Visit (HOSPITAL_COMMUNITY)
Admission: RE | Admit: 2019-03-04 | Discharge: 2019-03-04 | Disposition: A | Discharge status: Home / Self Care | Payer: BC Managed Care – PPO | Source: Ambulatory Visit | Attending: Cardiovascular Disease | Admitting: Cardiovascular Disease

## 2019-03-04 VITALS — BP 121/78 | HR 56 | Ht 72.0 in | Wt 224.0 lb

## 2019-03-04 DIAGNOSIS — I4891 Unspecified atrial fibrillation: Secondary | ICD-10-CM | POA: Diagnosis not present

## 2019-03-04 DIAGNOSIS — I4811 Longstanding persistent atrial fibrillation: Secondary | ICD-10-CM

## 2019-03-04 NOTE — Progress Notes (Signed)
*  PRELIMINARY RESULTS* Echocardiogram 2D Echocardiogram has been performed.  Leavy Cella 03/04/2019, 1:26 PM

## 2019-03-04 NOTE — Progress Notes (Signed)
HPI Mr. Jerry Richards is referred today by Dr. Johnsie Cancel for evaluation of atrial fib. He has a long h/o atrial fib and has had 3 catheter ablations. He has maintained nsr fairly nicely until a year ago and has had PAF since. He admits to consuming too much ETOH when he went to [place] a week ago and has been out of rhythm since then.  Allergies  Allergen Reactions  . Amiodarone     AMIODARONE ANALOGUES Hyperthyroidism     Current Outpatient Medications  Medication Sig Dispense Refill  . losartan (COZAAR) 50 MG tablet Take 50 mg by mouth daily.  6  . Rivaroxaban (XARELTO) 20 MG TABS Take 1 tablet by mouth daily.     Current Facility-Administered Medications  Medication Dose Route Frequency Provider Last Rate Last Admin  . 0.9 %  sodium chloride infusion  500 mL Intravenous Once Milus Banister, MD         Past Medical History:  Diagnosis Date  . Anxiety   . Atrial fibrillation ([place])    a. s/p RFCA x 2 (10/26/2011, 04/15/2012)  . Atrial flutter ([place])   . Chronic kidney disease   . Clotting disorder ([place])   . Current smoker   . 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 LA thrombus   . Non-ischemic cardiomyopathy ([place])   . OSA on CPAP   . Sinus trouble   . Sleep apnea   . Systolic heart failure (HCC)    EF 20-25%-> 50-55% in 10/2011. MOST RECENT ECHO (03/2012) EF= 66%    ROS:   All systems reviewed and negative except as noted in the HPI.   Past Surgical History:  Procedure Laterality Date  . ATRIAL ABLATION SURGERY    . CARDIAC CATHETERIZATION  08/2011   [facility]  . CHOLECYSTECTOMY    . FINGER SURGERY    . LEFT HEART CATHETERIZATION WITH CORONARY ANGIOGRAM N/A 09/12/2011   Procedure: LEFT HEART CATHETERIZATION WITH CORONARY ANGIOGRAM;  Surgeon: Hillary Bow, MD;  Location: [facility] CATH LAB;  Service: Cardiovascular;  Laterality: N/A;  . VASECTOMY       Family History  Problem Relation Age of Onset  . Atrial fibrillation Father         age 71  . Cancer Father        melanoma  . Healthy Mother        age 63  . Cancer Mother        melanoma  . Coronary artery disease Paternal Grandmother   . Heart disease Paternal Grandmother   . Heart attack Paternal Grandmother   . Coronary artery disease Paternal Grandfather   . Heart disease Paternal Grandfather   . Heart attack Paternal Grandfather   . Atrial fibrillation Paternal Aunt   . Atrial fibrillation Paternal Uncle   . Diabetes Maternal Grandmother      Social History   Socioeconomic History  . Marital status: Married    Spouse name: Not on file  . Number of children: Not on file  . Years of education: Not on file  . Highest education level: Not on file  Occupational History  . 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 Smoking status: Former Smoker    Packs/day: 7.00    Years: 20.00    Pack years: 140.00    Types: Cigarettes  . Smokeless tobacco: Never Used  Substance and Sexual Activity  . Alcohol use: Yes    Comment: drinks socially  . Drug use: No  . Sexual activity: Yes  Other Topics Concern  . Not on file  Social History Narrative   Has 2 children   Social Determinants of Health   Financial Resource Strain:   . Difficulty of Paying Living Expenses: Not on file  Food Insecurity:   . Worried About Charity fundraiser in the Last Year: Not on file  . Ran Out of Food in the Last Year: Not on file  Transportation Needs:   . Lack of Transportation (Medical): Not on file  . Lack of Transportation (Non-Medical): Not on file  Physical Activity:   . Days of Exercise per Week: Not on file  . Minutes of Exercise per Session: Not on file  Stress:   . Feeling of Stress : Not on file  Social Connections:   . Frequency of Communication with Friends and Family: Not on file  . Frequency of Social Gatherings with Friends and Family: Not on file  . Attends Religious Services: Not on file  . Active Member  of Clubs or Organizations: Not on file  . Attends Archivist Meetings: Not on file  . Marital Status: Not on file  Intimate Partner Violence:   . Fear of Current or Ex-Partner: Not on file  . Emotionally Abused: Not on file  . Physically Abused: Not on file  . Sexually Abused: Not on file     BP 121/78   Pulse (!) 56   Ht 6' (1.829 m)   Wt 224 lb (101.6 kg)   SpO2 98%   BMI 30.38 kg/m   Physical Exam:  Well appearing NAD HEENT: Unremarkable Neck:  No JVD, no thyromegally Lymphatics:  No adenopathy Back:  No CVA tenderness Lungs:  Clear with no wheezes HEART:  IRegular rate rhythm, no murmurs, no rubs, no clicks Abd:  soft, positive bowel sounds, no organomegally, no rebound, no guarding Ext:  2 plus pulses, no edema, no cyanosis, no clubbing Skin:  No rashes no nodules Neuro:  CN II through XII intact, motor grossly intact  ECG - reviewed  Assess/Plan: 1. Persistent atrial fib - his symptoms are fairly mild but he can tell when he is back out of rhythm. I have asked him to stop drinking. He will call us if he has not gone back to rhythm and I would admit him for dofetilide.  2. Tachy induced CM - he has class 1 symptoms. His 2D echo is pending.   Mikle Bosworth.D.

## 2019-03-04 NOTE — Patient Instructions (Signed)
Medication Instructions:  Your physician recommends that you continue on your current medications as directed. Please refer to the Current Medication list given to you today.  *If you need a refill on your cardiac medications before your next appointment, please call your pharmacy*  Lab Work: none If you have labs (blood work) drawn today and your tests are completely normal, you will receive your results only by: Marland Kitchen MyChart Message (if you have MyChart) OR . A paper copy in the mail If you have any lab test that is abnormal or we need to change your treatment, we will call you to review the results.  Testing/Procedures: none3  Follow-Up: At [facility], you and your health needs are our priority.  As part of our continuing mission to provide you with exceptional heart care, we have created designated Provider Care Teams.  These Care Teams include your primary Cardiologist (physician) and Advanced Practice Providers (APPs -  Physician Assistants and Nurse Practitioners) who all work together to provide you with the care you need, when you need it.  Your next appointment:   3 month(s)  The format for your next appointment:   In Person  Provider:   Cristopher Peru, MD  Other Instructions Call us in 1 month month if your Afib still persists       Thank you for choosing [facility] !

## 2019-03-10 DIAGNOSIS — Z20822 Contact with and (suspected) exposure to covid-19: Secondary | ICD-10-CM | POA: Diagnosis not present

## 2019-03-14 DIAGNOSIS — Z20828 Contact with and (suspected) exposure to other viral communicable diseases: Secondary | ICD-10-CM | POA: Diagnosis not present

## 2019-03-20 MED FILL — XARELTO 20 MG TABLET: 20 | 30 days supply | Qty: 30 | Fill #0

## 2019-04-09 DIAGNOSIS — D225 Melanocytic nevi of trunk: Secondary | ICD-10-CM | POA: Diagnosis not present

## 2019-04-09 DIAGNOSIS — D2262 Melanocytic nevi of left upper limb, including shoulder: Secondary | ICD-10-CM | POA: Diagnosis not present

## 2019-04-09 DIAGNOSIS — D234 Other benign neoplasm of skin of scalp and neck: Secondary | ICD-10-CM | POA: Diagnosis not present

## 2019-04-09 DIAGNOSIS — L821 Other seborrheic keratosis: Secondary | ICD-10-CM | POA: Diagnosis not present

## 2019-04-09 DIAGNOSIS — Z85828 Personal history of other malignant neoplasm of skin: Secondary | ICD-10-CM | POA: Diagnosis not present

## 2019-04-09 DIAGNOSIS — D3611 Benign neoplasm of peripheral nerves and autonomic nervous system of face, head, and neck: Secondary | ICD-10-CM | POA: Diagnosis not present

## 2019-04-22 MED FILL — XARELTO 20 MG TABLET: 20 | 30 days supply | Qty: 30 | Fill #1

## 2019-04-27 ENCOUNTER — Ambulatory Visit: Payer: BC Managed Care – PPO | Attending: Internal Medicine

## 2019-04-27 DIAGNOSIS — Z23 Encounter for immunization: Secondary | ICD-10-CM

## 2019-04-27 NOTE — Progress Notes (Signed)
   Covid-19 Vaccination Clinic  Name:  Jerry Richards    MRN: [id number] DOB: February 16, 1971  04/27/2019  Jerry Richards was observed post Covid-19 immunization for 15 minutes without incidence. He was provided with Vaccine Information Sheet and instruction to access the V-Safe system.   Jerry Richards was instructed to call 911 with any severe reactions post vaccine: Marland Kitchen Difficulty breathing  . Swelling of your face and throat  . A fast heartbeat  . A bad rash all over your body  . Dizziness and weakness    Immunizations Administered    Name Date Dose VIS Date Route   Moderna COVID-19 Vaccine 04/27/2019  8:56 AM 0.5 mL 01/28/2019 Intramuscular   Manufacturer: Moderna   Lot: [id number]   [id number]

## 2019-04-28 DIAGNOSIS — N028 Recurrent and persistent hematuria with other morphologic changes: Secondary | ICD-10-CM | POA: Diagnosis not present

## 2019-04-28 DIAGNOSIS — N2581 Secondary hyperparathyroidism of renal origin: Secondary | ICD-10-CM | POA: Diagnosis not present

## 2019-04-28 DIAGNOSIS — N189 Chronic kidney disease, unspecified: Secondary | ICD-10-CM | POA: Diagnosis not present

## 2019-04-28 DIAGNOSIS — N183 Chronic kidney disease, stage 3 unspecified: Secondary | ICD-10-CM | POA: Diagnosis not present

## 2019-04-28 DIAGNOSIS — I482 Chronic atrial fibrillation, unspecified: Secondary | ICD-10-CM | POA: Diagnosis not present

## 2019-05-26 ENCOUNTER — Ambulatory Visit: Payer: BC Managed Care – PPO | Admitting: Internal Medicine

## 2019-05-31 ENCOUNTER — Other Ambulatory Visit: Payer: Self-pay

## 2019-05-31 ENCOUNTER — Ambulatory Visit: Payer: BC Managed Care – PPO | Attending: Internal Medicine

## 2019-05-31 DIAGNOSIS — Z23 Encounter for immunization: Secondary | ICD-10-CM

## 2019-05-31 NOTE — Progress Notes (Signed)
   Covid-19 Vaccination Clinic  Name:  Jerry Richards    MRN: [id number] DOB: Jul 05, 1970  05/31/2019  Mr. Jerry Richards was observed post Covid-19 immunization for 15 minutes without incident. He was provided with Vaccine Information Sheet and instruction to access the V-Safe system.   Mr. Jerry Richards was instructed to call 911 with any severe reactions post vaccine: Marland Kitchen Difficulty breathing  . Swelling of face and throat  . A fast heartbeat  . A bad rash all over body  . Dizziness and weakness   Immunizations Administered    Name Date Dose VIS Date Route   Moderna COVID-19 Vaccine 05/31/2019  8:54 AM 0.5 mL 01/28/2019 Intramuscular   Manufacturer: Moderna   Lot: [id number]   [id number]

## 2019-06-03 ENCOUNTER — Ambulatory Visit: Payer: BC Managed Care – PPO | Admitting: Internal Medicine

## 2019-06-03 ENCOUNTER — Encounter: Payer: Self-pay | Admitting: Internal Medicine

## 2019-06-03 ENCOUNTER — Other Ambulatory Visit: Payer: Self-pay

## 2019-06-03 VITALS — BP 124/86 | HR 55 | Temp 98.3°F | Ht 72.0 in | Wt 223.0 lb

## 2019-06-03 DIAGNOSIS — I4811 Longstanding persistent atrial fibrillation: Secondary | ICD-10-CM

## 2019-06-03 MED ORDER — METOPROLOL TARTRATE 50 MG PO TABS: 50.0000 mg | ORAL_TABLET | ORAL | 3 refills | Status: DC | PRN

## 2019-06-03 NOTE — Progress Notes (Signed)
HPI Jerry Richards returns today for followup. He is a pleasant 49 yo man with a h/o persistent atrial fib, and episodic excessive ETOH use, s/p 3 different atrial fib ablations, who I saw in early January. At the time he was in atrial fib after spending time in [place] and consuming too much ETOH. He eventually went back to [facility]. He had done well until going to [facility]. He had more atrial fib. He has returned back to NSR. Allergies  Allergen Reactions  . Amiodarone     AMIODARONE ANALOGUES Hyperthyroidism     Current Outpatient Medications  Medication Sig Dispense Refill  . losartan (COZAAR) 50 MG tablet Take 50 mg by mouth daily.  6  . Rivaroxaban (XARELTO) 20 MG TABS Take 1 tablet by mouth daily.     Current Facility-Administered Medications  Medication Dose Route Frequency Provider Last Rate Last Admin  . 0.9 %  sodium chloride infusion  500 mL Intravenous Once Milus Banister, MD         Past Medical History:  Diagnosis Date  . Anxiety   . Atrial fibrillation ([place])    a. s/p RFCA x 2 (10/26/2011, 04/15/2012)  . Atrial flutter ([place])   . Chronic kidney disease   . Clotting disorder ([place])   . Current smoker   . 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 LA thrombus   . Non-ischemic cardiomyopathy ([place])   . OSA on CPAP   . Sinus trouble   . Sleep apnea   . Systolic heart failure (HCC)    EF 20-25%-> 50-55% in 10/2011. MOST RECENT ECHO (03/2012) EF= 66%    ROS:   All systems reviewed and negative except as noted in the HPI.   Past Surgical History:  Procedure Laterality Date  . ATRIAL ABLATION SURGERY    . CARDIAC CATHETERIZATION  08/2011   [facility]  . CHOLECYSTECTOMY    . FINGER SURGERY    . LEFT HEART CATHETERIZATION WITH CORONARY ANGIOGRAM N/A 09/12/2011   Procedure: LEFT HEART CATHETERIZATION WITH CORONARY ANGIOGRAM;  Surgeon: Hillary Bow, MD;  Location: [facility] CATH LAB;  Service: Cardiovascular;  Laterality: N/A;  .  VASECTOMY       Family History  Problem Relation Age of Onset  . Atrial fibrillation Father        age 48  . Cancer Father        melanoma  . Healthy Mother        age 13  . Cancer Mother        melanoma  . Coronary artery disease Paternal Grandmother   . Heart disease Paternal Grandmother   . Heart attack Paternal Grandmother   . Coronary artery disease Paternal Grandfather   . Heart disease Paternal Grandfather   . Heart attack Paternal Grandfather   . Atrial fibrillation Paternal Aunt   . Atrial fibrillation Paternal Uncle   . Diabetes Maternal Grandmother      Social History   Socioeconomic History  . Marital status: Married    Spouse name: Not on file  . Number of children: Not on file  . Years of education: Not on file  . Highest education level: Not on file  Occupational History  . 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 Smoking status: Former Smoker    Packs/day: 7.00    Years: 20.00  Pack years: 140.00    Types: Cigarettes  . Smokeless tobacco: Never Used  Substance and Sexual Activity  . Alcohol use: Yes    Comment: drinks socially  . Drug use: No  . Sexual activity: Yes  Other Topics Concern  . Not on file  Social History Narrative   Has 2 children   Social Determinants of Health   Financial Resource Strain:   . Difficulty of Paying Living Expenses:   Food Insecurity:   . Worried About Charity fundraiser in the Last Year:   . Arboriculturist in the Last Year:   Transportation Needs:   . Film/video editor (Medical):   Marland Kitchen Lack of Transportation (Non-Medical):   Physical Activity:   . Days of Exercise per Week:   . Minutes of Exercise per Session:   Stress:   . Feeling of Stress :   Social Connections:   . Frequency of Communication with Friends and Family:   . Frequency of Social Gatherings with Friends and Family:   . Attends Religious Services:   . Active Member of Clubs or  Organizations:   . Attends Archivist Meetings:   Marland Kitchen Marital Status:   Intimate Partner Violence:   . Fear of Current or Ex-Partner:   . Emotionally Abused:   Marland Kitchen Physically Abused:   . Sexually Abused:      BP 124/86   Pulse (!) 55   Temp 98.3 F (36.8 C)   Ht 6' (1.829 m)   Wt 223 lb (101.2 kg)   SpO2 96%   BMI 30.24 kg/m   Physical Exam:  Well appearing NAD HEENT: Unremarkable Neck:  No JVD, no thyromegally Lymphatics:  No adenopathy Back:  No CVA tenderness Lungs:  Clear with no wheezes HEART:  Regular rate rhythm, no murmurs, no rubs, no clicks Abd:  soft, positive bowel sounds, no organomegally, no rebound, no guarding Ext:  2 plus pulses, no edema, no cyanosis, no clubbing Skin:  No rashes no nodules Neuro:  CN II through XII intact, motor grossly intact   Assess/Plan: 1. Atrial fib - I discussed the importance of less ETOH. He has been prescribed short acting as needed metoprolol to help control his ventricular rate when he goes into atrial fib.  2. Tachy induced CM - his repeat EF 3 months ago demonstrated preserved LV function. He will conitnue his current meds.  Mikle Bosworth.D.

## 2019-06-03 NOTE — Patient Instructions (Signed)
Medication Instructions:  Your physician recommends that you continue on your current medications as directed. Please refer to the Current Medication list given to you today.  *If you need a refill on your cardiac medications before your next appointment, please call your pharmacy*   Lab Work: NONE   If you have labs (blood work) drawn today and your tests are completely normal, you will receive your results only by: . MyChart Message (if you have MyChart) OR . A paper copy in the mail If you have any lab test that is abnormal or we need to change your treatment, we will call you to review the results.   Testing/Procedures: NONE    Follow-Up: At [facility], you and your health needs are our priority.  As part of our continuing mission to provide you with exceptional heart care, we have created designated Provider Care Teams.  These Care Teams include your primary Cardiologist (physician) and Advanced Practice Providers (APPs -  Physician Assistants and Nurse Practitioners) who all work together to provide you with the care you need, when you need it.  We recommend signing up for the patient portal called "MyChart".  Sign up information is provided on this After Visit Summary.  MyChart is used to connect with patients for Virtual Visits (Telemedicine).  Patients are able to view lab/test results, encounter notes, upcoming appointments, etc.  Non-urgent messages can be sent to your provider as well.   To learn more about what you can do with MyChart, go to [email or web address].    Your next appointment:   1 year(s)  The format for your next appointment:   In Person  Provider:   Gregg Taylor, MD   Other Instructions Thank you for choosing [facility]!    

## 2019-06-04 DIAGNOSIS — Z9189 Other specified personal risk factors, not elsewhere classified: Secondary | ICD-10-CM | POA: Diagnosis not present

## 2019-06-04 DIAGNOSIS — Z13220 Encounter for screening for lipoid disorders: Secondary | ICD-10-CM | POA: Diagnosis not present

## 2019-06-04 DIAGNOSIS — K5792 Diverticulitis of intestine, part unspecified, without perforation or abscess without bleeding: Secondary | ICD-10-CM | POA: Diagnosis not present

## 2019-06-04 DIAGNOSIS — N183 Chronic kidney disease, stage 3 unspecified: Secondary | ICD-10-CM | POA: Diagnosis not present

## 2019-06-04 DIAGNOSIS — I480 Paroxysmal atrial fibrillation: Secondary | ICD-10-CM | POA: Diagnosis not present

## 2019-06-04 MED FILL — XARELTO 20 MG TABLET: 20 | 30 days supply | Qty: 30 | Fill #2

## 2019-06-12 DIAGNOSIS — N028 Recurrent and persistent hematuria with other morphologic changes: Secondary | ICD-10-CM | POA: Diagnosis not present

## 2019-06-12 DIAGNOSIS — I480 Paroxysmal atrial fibrillation: Secondary | ICD-10-CM | POA: Diagnosis not present

## 2019-06-12 DIAGNOSIS — G43909 Migraine, unspecified, not intractable, without status migrainosus: Secondary | ICD-10-CM | POA: Diagnosis not present

## 2019-06-12 DIAGNOSIS — K635 Polyp of colon: Secondary | ICD-10-CM | POA: Diagnosis not present

## 2019-06-30 MED FILL — XARELTO 20 MG TABLET: 20 | 30 days supply | Qty: 30 | Fill #3

## 2019-08-04 ENCOUNTER — Other Ambulatory Visit (HOSPITAL_COMMUNITY): Payer: Self-pay | Admitting: Family Medicine

## 2019-09-05 MED FILL — XARELTO 20 MG TABLET: 20 | 30 days supply | Qty: 30 | Fill #1

## 2019-10-10 MED FILL — XARELTO 20 MG TABLET: 20 | 30 days supply | Qty: 30 | Fill #2

## 2019-10-16 ENCOUNTER — Ambulatory Visit: Payer: Self-pay | Admitting: Medical

## 2019-10-16 ENCOUNTER — Other Ambulatory Visit: Payer: Self-pay

## 2019-10-16 DIAGNOSIS — Z20822 Contact with and (suspected) exposure to covid-19: Secondary | ICD-10-CM

## 2019-10-16 LAB — POC COVID19 BINAXNOW: SARS Coronavirus 2 Ag: NEGATIVE

## 2019-10-31 MED FILL — XARELTO 20 MG TABLET: 20 | 30 days supply | Qty: 30 | Fill #3

## 2019-11-18 DIAGNOSIS — N183 Chronic kidney disease, stage 3 unspecified: Secondary | ICD-10-CM | POA: Diagnosis not present

## 2019-11-18 DIAGNOSIS — N189 Chronic kidney disease, unspecified: Secondary | ICD-10-CM | POA: Diagnosis not present

## 2019-11-18 DIAGNOSIS — N2581 Secondary hyperparathyroidism of renal origin: Secondary | ICD-10-CM | POA: Diagnosis not present

## 2019-11-18 DIAGNOSIS — I482 Chronic atrial fibrillation, unspecified: Secondary | ICD-10-CM | POA: Diagnosis not present

## 2019-11-18 DIAGNOSIS — N028 Recurrent and persistent hematuria with other morphologic changes: Secondary | ICD-10-CM | POA: Diagnosis not present

## 2019-12-16 DIAGNOSIS — Z23 Encounter for immunization: Secondary | ICD-10-CM | POA: Diagnosis not present

## 2019-12-16 DIAGNOSIS — Z0001 Encounter for general adult medical examination with abnormal findings: Secondary | ICD-10-CM | POA: Diagnosis not present

## 2019-12-17 MED FILL — XARELTO 20 MG TABLET: 20 | 30 days supply | Qty: 30 | Fill #4

## 2019-12-28 ENCOUNTER — Emergency Department (HOSPITAL_COMMUNITY): Payer: BC Managed Care – PPO

## 2019-12-28 ENCOUNTER — Other Ambulatory Visit: Payer: Self-pay

## 2019-12-28 ENCOUNTER — Emergency Department (HOSPITAL_COMMUNITY)
Admission: EM | Admit: 2019-12-28 | Discharge: 2019-12-28 | Disposition: A | Discharge status: Home / Self Care | Payer: BC Managed Care – PPO | Attending: Emergency Medicine | Admitting: Emergency Medicine

## 2019-12-28 ENCOUNTER — Encounter (HOSPITAL_COMMUNITY): Payer: Self-pay | Admitting: Emergency Medicine

## 2019-12-28 DIAGNOSIS — Z79899 Other long term (current) drug therapy: Secondary | ICD-10-CM | POA: Diagnosis not present

## 2019-12-28 DIAGNOSIS — R197 Diarrhea, unspecified: Secondary | ICD-10-CM | POA: Diagnosis not present

## 2019-12-28 DIAGNOSIS — Z7901 Long term (current) use of anticoagulants: Secondary | ICD-10-CM | POA: Diagnosis not present

## 2019-12-28 DIAGNOSIS — R1030 Lower abdominal pain, unspecified: Secondary | ICD-10-CM | POA: Diagnosis not present

## 2019-12-28 DIAGNOSIS — I5020 Unspecified systolic (congestive) heart failure: Secondary | ICD-10-CM | POA: Diagnosis not present

## 2019-12-28 DIAGNOSIS — R1032 Left lower quadrant pain: Secondary | ICD-10-CM

## 2019-12-28 DIAGNOSIS — N189 Chronic kidney disease, unspecified: Secondary | ICD-10-CM | POA: Diagnosis not present

## 2019-12-28 DIAGNOSIS — K529 Noninfective gastroenteritis and colitis, unspecified: Secondary | ICD-10-CM | POA: Diagnosis not present

## 2019-12-28 DIAGNOSIS — Z87891 Personal history of nicotine dependence: Secondary | ICD-10-CM | POA: Diagnosis not present

## 2019-12-28 DIAGNOSIS — K5190 Ulcerative colitis, unspecified, without complications: Secondary | ICD-10-CM | POA: Diagnosis not present

## 2019-12-28 DIAGNOSIS — K7689 Other specified diseases of liver: Secondary | ICD-10-CM | POA: Diagnosis not present

## 2019-12-28 DIAGNOSIS — R319 Hematuria, unspecified: Secondary | ICD-10-CM | POA: Insufficient documentation

## 2019-12-28 DIAGNOSIS — I4891 Unspecified atrial fibrillation: Secondary | ICD-10-CM | POA: Insufficient documentation

## 2019-12-28 DIAGNOSIS — K6389 Other specified diseases of intestine: Secondary | ICD-10-CM | POA: Diagnosis not present

## 2019-12-28 LAB — COMPREHENSIVE METABOLIC PANEL
ALT: 22 U/L (ref 0–44)
AST: 19 U/L (ref 15–41)
Albumin: 3.7 g/dL (ref 3.5–5.0)
Alkaline Phosphatase: 39 U/L (ref 38–126)
Anion gap: 10 (ref 5–15)
BUN: 17 mg/dL (ref 6–20)
CO2: 22 mmol/L (ref 22–32)
Calcium: 8.7 mg/dL — ABNORMAL LOW (ref 8.9–10.3)
Chloride: 104 mmol/L (ref 98–111)
Creatinine, Ser: 1.52 mg/dL — ABNORMAL HIGH (ref 0.61–1.24)
GFR, Estimated: 55 mL/min — ABNORMAL LOW (ref >60)
Glucose, Bld: 94 mg/dL (ref 70–99)
Potassium: 3.8 mmol/L (ref 3.5–5.1)
Sodium: 136 mmol/L (ref 135–145)
Total Bilirubin: 1.4 mg/dL — ABNORMAL HIGH (ref 0.3–1.2)
Total Protein: 6.6 g/dL (ref 6.5–8.1)

## 2019-12-28 LAB — URINALYSIS, ROUTINE W REFLEX MICROSCOPIC
Bilirubin Urine: NEGATIVE
Glucose, UA: NEGATIVE mg/dL
Ketones, ur: 5 mg/dL — AB
Leukocytes,Ua: NEGATIVE
Nitrite: NEGATIVE
Protein, ur: 100 mg/dL — AB
RBC / HPF: >50 RBC/hpf — ABNORMAL HIGH (ref 0–5)
Specific Gravity, Urine: 1.021 (ref 1.005–1.030)
pH: 6 (ref 5.0–8.0)

## 2019-12-28 LAB — CBC WITH DIFFERENTIAL/PLATELET
Abs Immature Granulocytes: 0.02 10*3/uL (ref 0.00–0.07)
Basophils Absolute: 0 10*3/uL (ref 0.0–0.1)
Basophils Relative: 0 %
Eosinophils Absolute: 0.1 10*3/uL (ref 0.0–0.5)
Eosinophils Relative: 1 %
HCT: 44.8 % (ref 39.0–52.0)
Hemoglobin: 15.2 g/dL (ref 13.0–17.0)
Immature Granulocytes: 0 %
Lymphocytes Relative: 11 %
Lymphs Abs: 0.8 10*3/uL (ref 0.7–4.0)
MCH: 30.3 pg (ref 26.0–34.0)
MCHC: 33.9 g/dL (ref 30.0–36.0)
MCV: 89.2 fL (ref 80.0–100.0)
Monocytes Absolute: 0.5 10*3/uL (ref 0.1–1.0)
Monocytes Relative: 7 %
Neutro Abs: 6 10*3/uL (ref 1.7–7.7)
Neutrophils Relative %: 81 %
Platelets: 168 10*3/uL (ref 150–400)
RBC: 5.02 MIL/uL (ref 4.22–5.81)
RDW: 13.4 % (ref 11.5–15.5)
WBC: 7.4 10*3/uL (ref 4.0–10.5)
nRBC: 0 % (ref 0.0–0.2)

## 2019-12-28 LAB — LIPASE, BLOOD: Lipase: 33 U/L (ref 11–51)

## 2019-12-28 IMAGING — CT CT ABD-PELV W/ CM
2 of 5 series · 15 of 46 positions shown, 17 images · IV contrast (Omnipaque or Isovue)
Comparison: None.

CLINICAL DATA: Left lower quadrant pain and diarrhea.

EXAM:
CT ABDOMEN AND PELVIS WITH CONTRAST
TECHNIQUE: Multidetector CT imaging of the abdomen and pelvis was performed
using the standard protocol following bolus administration of
intravenous contrast.
CONTRAST:  80mL OMNIPAQUE IOHEXOL 300 MG/ML  SOLN

[Series 2: axial st · axial · 0.83mm/px · z∈[+309,+744]mm · 12 of 97 slices shown, 14 images]
[im 5/97  soft-tissue]
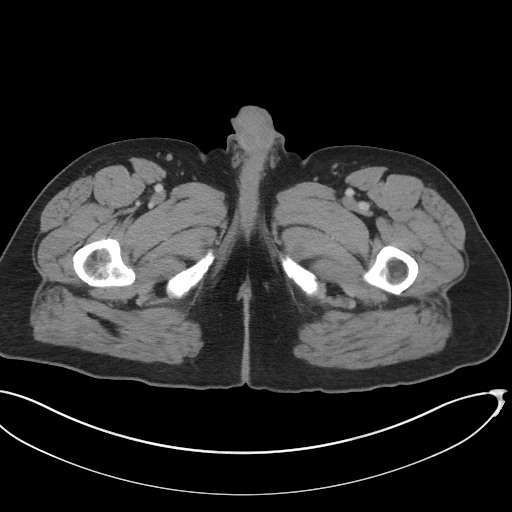
[im 5/97  bone]
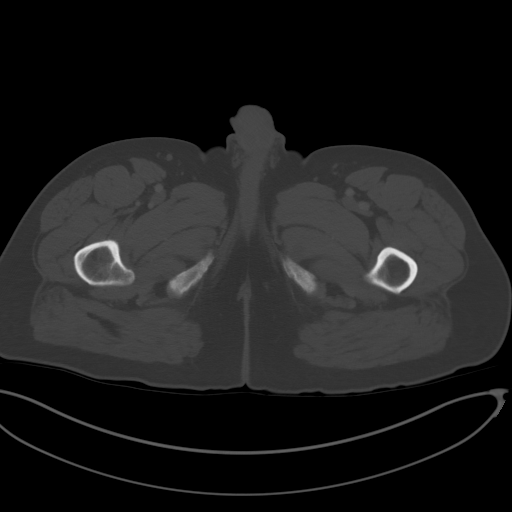
[im 15/97  soft-tissue]
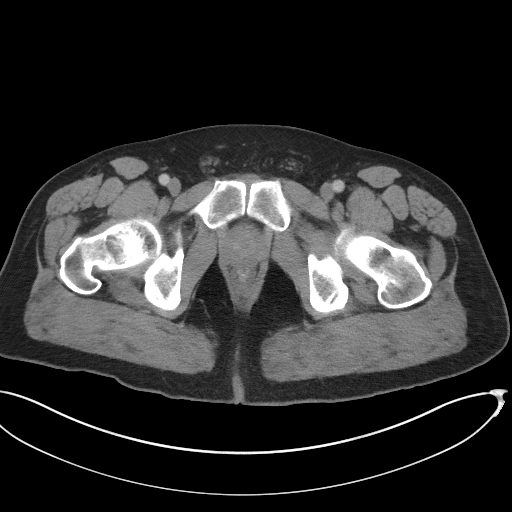
[im 20/97  soft-tissue]
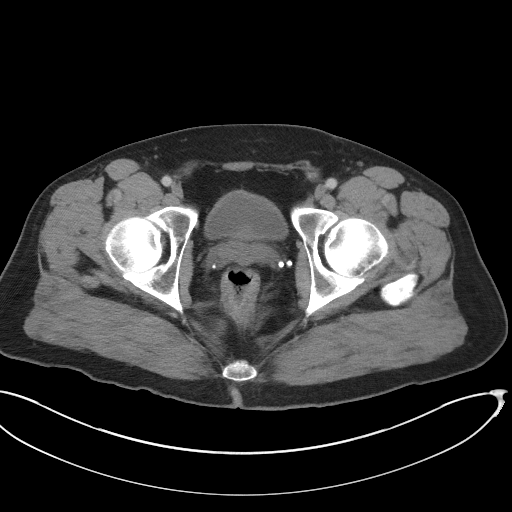
[im 29/97  soft-tissue]
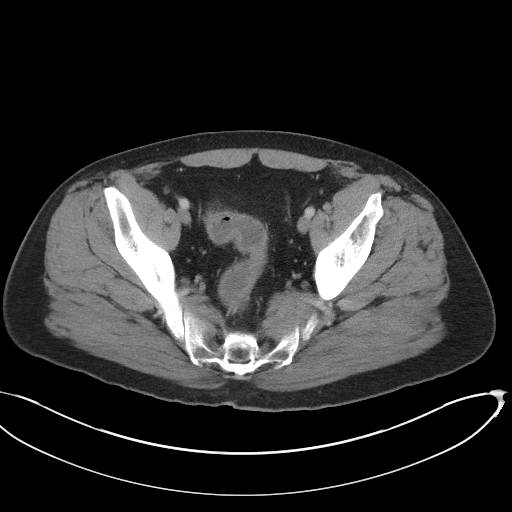
[im 39/97  soft-tissue]
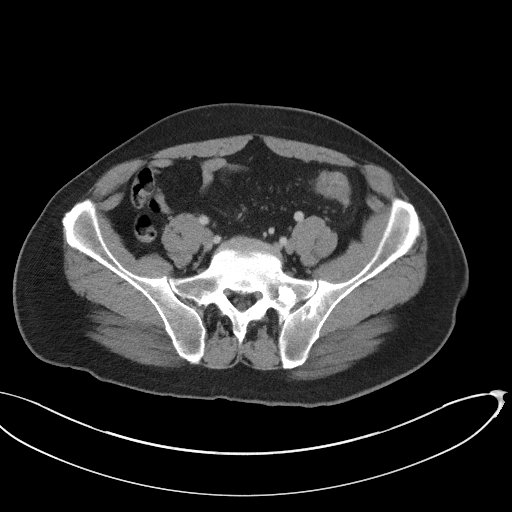
[im 44/97  soft-tissue]
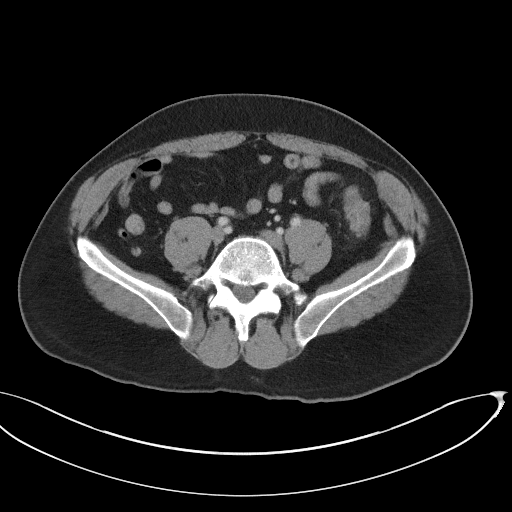
[im 53/97  soft-tissue]
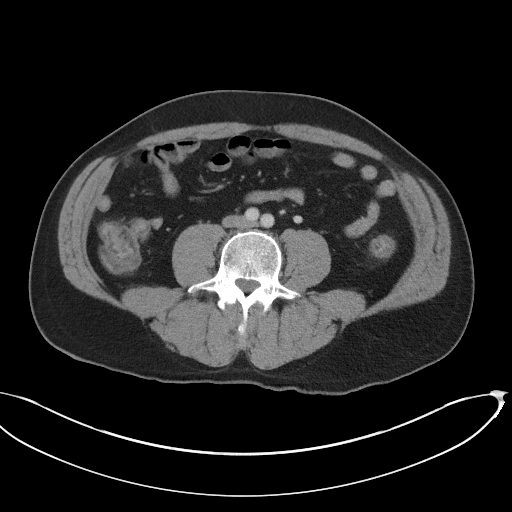
[im 58/97  soft-tissue]
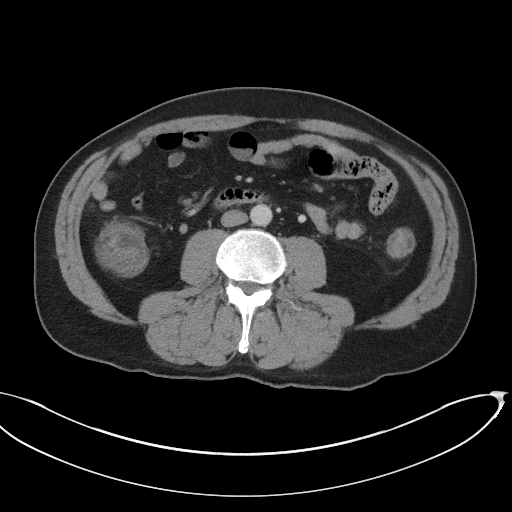
[im 68/97  soft-tissue]
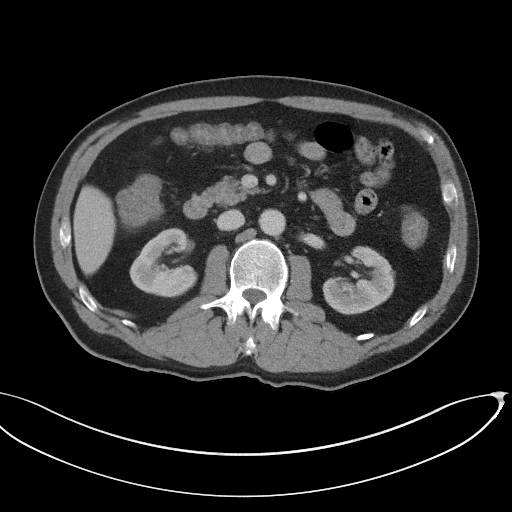
[im 68/97  bone]
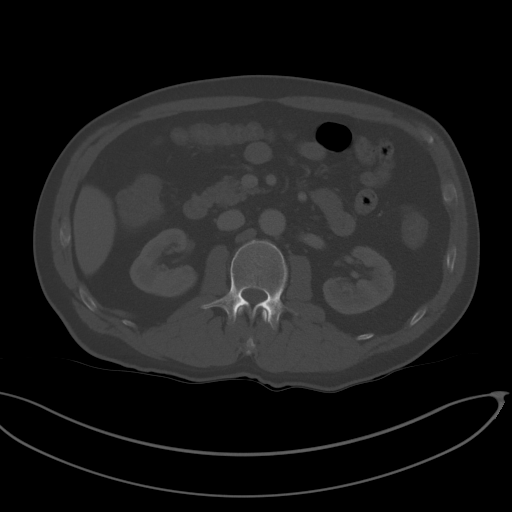
[im 77/97  soft-tissue]
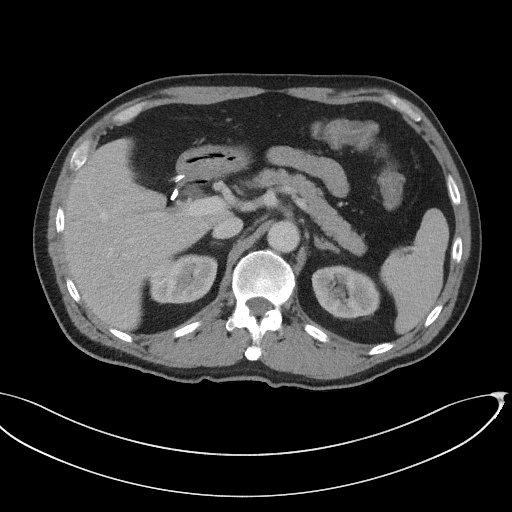
[im 82/97  soft-tissue]
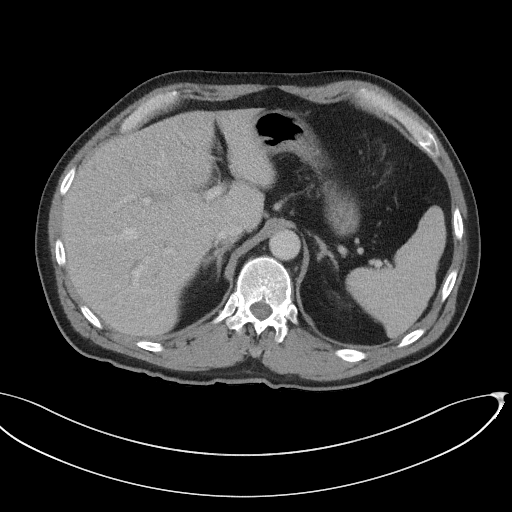
[im 92/97  soft-tissue]
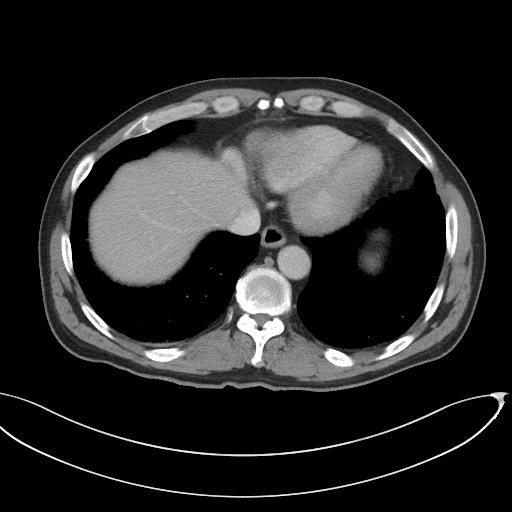

[Series 5: coronal st · coronal · 0.81mm/px · 3 of 100 slices shown]
[im 34/100  soft-tissue]
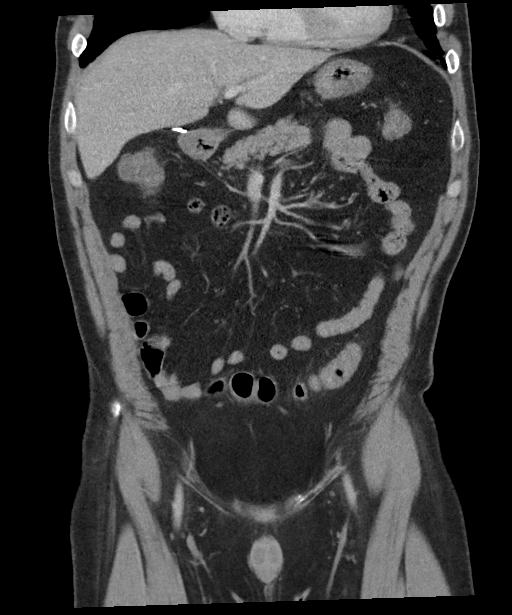
[im 45/100  soft-tissue]
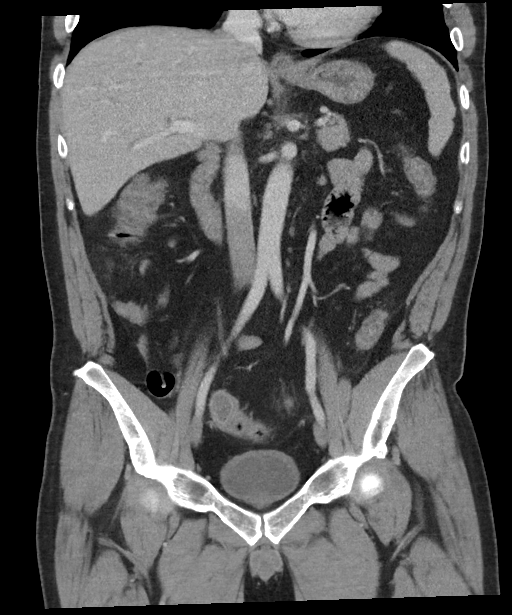
[im 56/100  soft-tissue]
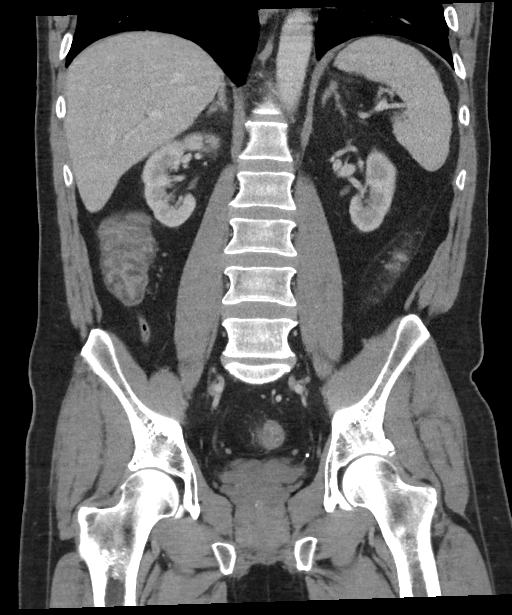

[15 of 46 positions shown; findings below may reference images not displayed]

FINDINGS: Lower chest: Partially visualized circumscribed 7 mm nodule in the
right lung base, image [DATE], sequence 4. The lungs otherwise clear.

Hepatobiliary: Scattered few mm too small to be actually
characterize hypoattenuated lesions throughout the liver. Post
cholecystectomy. No biliary ductal dilation.

Pancreas: Unremarkable. No pancreatic ductal dilatation or
surrounding inflammatory changes.

Spleen: Normal in size without focal abnormality.

Adrenals/Urinary Tract: Normal adrenal glands. Bilateral too small
to be actually characterize hypoattenuated lesions throughout both
kidneys, statistically likely representing cysts. Normal urinary
bladder.

Stomach/Bowel: Normal appearance of the stomach and small bowel.
Normal appendix. The colon is decompressed. Long segment of
circumferential mucosal thickening of the descending and sigmoid
colon. While there are scattered diverticula throughout this part of
the colon, the inflammatory changes are more diffuse than usually
seen with acute diverticulitis, and therefore this may represent
different form of colitis.

Vascular/Lymphatic: No significant vascular findings are present. No
enlarged abdominal or pelvic lymph nodes. Shotty mesenteric lymph
nodes in the right lower quadrant of the abdomen, nonspecific.

Reproductive: Prostate is not enlarged.

Other: No abdominal wall hernia or abnormality. No abdominopelvic
ascites.

Musculoskeletal: 1.2 cm sclerotic focus in the left sacrum, slightly
enlarged when compared to CT scan dated [DATE]. Stable
sclerotic focus in the left pubic ramus.
IMPRESSION: 1. Long segment of circumferential mucosal thickening of the
descending and sigmoid colon. While there are scattered diverticula
throughout this part of the colon, the inflammatory changes are more
diffuse than usually seen with acute diverticulitis, and therefore
this may represent a different form of colitis. Colonic malignancy
is considered less likely, however correlation with colonoscopy
after resolution of patient's symptoms is recommended.
2. Partially visualized circumscribed 7 mm right lung base pulmonary
nodule. Further evaluation with chest CT is recommended.
3. 1.2 cm sclerotic focus in the left sacrum, slightly enlarged when
compared to CT scan dated [DATE]. In the absence of
malignancy capable of producing sclerotic lesions, this may
represent a bone island, however it's interval enlargement warrants
short-term follow-up, or further characterization with a bone scan.

## 2019-12-28 MED ORDER — IOHEXOL 300 MG/ML  SOLN
80.0000 mL | Freq: Once | INTRAMUSCULAR | Status: AC | PRN
  Administered 2019-12-28: 80 mL via INTRAVENOUS

## 2019-12-28 MED ORDER — MORPHINE SULFATE (PF) 4 MG/ML IV SOLN
4.0000 mg | Freq: Once | INTRAVENOUS | Status: AC
  Administered 2019-12-28: 4 mg via INTRAVENOUS
  Filled 2019-12-28: qty 1

## 2019-12-28 MED ORDER — SODIUM CHLORIDE 0.9 % IV BOLUS
500.0000 mL | Freq: Once | INTRAVENOUS | Status: AC
  Administered 2019-12-28: 500 mL via INTRAVENOUS

## 2019-12-28 MED ORDER — OXYCODONE-ACETAMINOPHEN 5-325 MG PO TABS
1.0000 | ORAL_TABLET | Freq: Four times a day (QID) | ORAL | 0 refills | Status: DC | PRN
Start: 2019-12-28 — End: 2020-01-12

## 2019-12-28 MED ORDER — ONDANSETRON HCL 4 MG/2ML IJ SOLN
4.0000 mg | Freq: Once | INTRAMUSCULAR | Status: AC
  Administered 2019-12-28: 4 mg via INTRAVENOUS
  Filled 2019-12-28: qty 2

## 2019-12-28 MED ORDER — AMOXICILLIN-POT CLAVULANATE 875-125 MG PO TABS: 1.0000 | ORAL_TABLET | Freq: Two times a day (BID) | ORAL | 0 refills | Status: DC

## 2019-12-28 NOTE — ED Notes (Addendum)
Pt reports chest pressure/tightness that started all of a sudden. Pt placed on cardiac monitor. nad noted. EKG performed. PA aware and reported to add morphine to allergy list.

## 2019-12-28 NOTE — ED Triage Notes (Signed)
Pt reports LLQ pain and diarrhea x1 week. Pt reports history diverticulitis. Pt denies any fever, recent abx use or covid exposure.

## 2019-12-28 NOTE — ED Provider Notes (Signed)
[facility] EMERGENCY DEPARTMENT Provider Note   CSN: [id number] Arrival date & time: 12/28/19  6195     History Chief Complaint  Patient presents with  . Abdominal Pain    Jerry Richards is a 49 y.o. male.  Jerry Richards is a 49 y.o. male with a history of A. fib, nonischemic cardiomyopathy, CKD, diverticulitis, sleep apnea, who presents to the ED for evaluation of left lower quadrant abdominal pain.  Patient states around Tuesday he started feeling poorly and states that he was just fatigued and feeling a bit off.  Friday and Saturday he started developing some lower abdominal pain on the left associated with poor appetite and has been having persistent diarrhea for the past 48 hours.  Denies any bright red blood or dark black stools.  No fevers or chills.  He states that he has previously had multiple episodes of diverticulitis and pain feels very similar, but he does not typically have such bad diarrhea with diverticulitis.  Denies any dysuria or urinary frequency.  No flank pain.  No chest pain or shortness of breath.  No cough or sick contacts.  No other aggravating or alleviating factors.  Was previously seen by [facility] and had colonoscopy after episodes of diverticulitis.        Past Medical History:  Diagnosis Date  . Anxiety   . Atrial fibrillation ([place])    a. s/p RFCA x 2 (10/26/2011, 04/15/2012)  . Atrial flutter ([place])   . Chronic kidney disease   . Clotting disorder ([place])   . Current smoker   . 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 LA thrombus   . Non-ischemic cardiomyopathy ([place])   . OSA on CPAP   . Sinus trouble   . Sleep apnea   . Systolic heart failure (HCC)    EF 20-25%-> 50-55% in 10/2011. MOST RECENT ECHO (03/2012) EF= 66%    Patient Active Problem List   Diagnosis Date Noted  . Sinus node dysfunction ([place]) 02/11/2014  . Pulmonary vein stenosis 06/30/2013  . S/P ablation of atrial fibrillation  02/25/2013  . 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 Tobacco abuse 09/02/2012  . Hyperthyroidism 09/02/2012  . Palpitations 07/09/2012  . Chronic anticoagulation 02/09/2012  . Coronary artery disease excluded 02/09/2012  . History of radiofrequency ablation for complex left atrial arrhythmia 02/09/2012  . Atrial fibrillation with slow ventricular response ([place]) 02/09/2012  . Tachycardia induced cardiomyopathy ([place]) 02/09/2012  . Persistent atrial fibrillation ([place]) 10/26/2011  . Sleep apnea 10/26/2011  . Non-ischemic cardiomyopathy ([place]) 10/26/2011  . Atrial fibrillation ([place]) 08/21/2011  . Tachycardia induced cardiomyopathy ([place]) 08/21/2011  . Atrial flutter ([place]) 08/21/2011  . Obstructive sleep apnea 08/21/2011  . FH: coronary artery disease 08/21/2011  . Left atrial thrombus 06/10/2011    Past Surgical History:  Procedure Laterality Date  . ATRIAL ABLATION SURGERY    . CARDIAC CATHETERIZATION  08/2011   [facility]  . CHOLECYSTECTOMY    . FINGER SURGERY    . LEFT HEART CATHETERIZATION WITH CORONARY ANGIOGRAM N/A 09/12/2011   Procedure: LEFT HEART CATHETERIZATION WITH CORONARY ANGIOGRAM;  Surgeon: Hillary Bow, MD;  Location: [facility] CATH LAB;  Service: Cardiovascular;  Laterality: N/A;  . VASECTOMY         Family History  Problem Relation Age of Onset  . Atrial fibrillation Father        age 79  . Cancer Father  melanoma  . Healthy Mother        age 61  . Cancer Mother        melanoma  . Coronary artery disease Paternal Grandmother   . Heart disease Paternal Grandmother   . Heart attack Paternal Grandmother   . Coronary artery disease Paternal Grandfather   . Heart disease Paternal Grandfather   . Heart attack Paternal Grandfather   . Atrial fibrillation Paternal Aunt   . Atrial fibrillation Paternal Uncle   . Diabetes Maternal Grandmother     Social History   Tobacco Use  . Smoking status: Former Smoker    Packs/day: 7.00    Years: 20.00     Pack years: 140.00    Types: Cigarettes  . Smokeless tobacco: Never Used  Vaping Use  . Vaping Use: Never used  Substance Use Topics  . Alcohol use: Yes    Comment: drinks socially  . Drug use: No    Home Medications Prior to Admission medications   Medication Sig Start Date End Date Taking? Authorizing Provider  losartan (COZAAR) 50 MG tablet Take 50 mg by mouth daily. 12/09/15  Yes [provider]  Rivaroxaban (XARELTO) 20 MG TABS Take 1 tablet by mouth daily.   Yes [provider]  amoxicillin-clavulanate (AUGMENTIN) 875-125 MG tablet Take 1 tablet by mouth 2 (two) times daily. One po bid x 7 days 12/28/19   Jacqlyn Larsen, PA-C  metoprolol tartrate (LOPRESSOR) 50 MG tablet Take 1 tablet (50 mg total) by mouth as needed. Patient taking differently: Take 50 mg by mouth as needed (heart reate).  06/03/19 09/01/19  Evans Lance, MD  oxyCODONE-acetaminophen (PERCOCET) 5-325 MG tablet Take 1 tablet by mouth every 6 (six) hours as needed. 12/28/19   Jacqlyn Larsen, PA-C    Allergies    Amiodarone and Morphine and related  Review of Systems   Review of Systems  Constitutional: Negative for chills and fever.  HENT: Negative.   Respiratory: Negative for cough, shortness of breath and stridor.   Cardiovascular: Negative for chest pain.  Gastrointestinal: Positive for abdominal pain and diarrhea. Negative for blood in stool, nausea and vomiting.  Genitourinary: Negative for dysuria and frequency.  Musculoskeletal: Negative for arthralgias and myalgias.  Skin: Negative for color change and rash.  Neurological: Negative for dizziness, syncope and light-headedness.  All other systems reviewed and are negative.   Physical Exam Updated Vital Signs BP 124/86 (BP Location: Right Arm)   Pulse 99   Temp 98.2 F (36.8 C) (Oral)   Resp 18   Ht 6' (1.829 m)   Wt 96.2 kg   SpO2 96%   BMI 28.75 kg/m   Physical Exam Vitals and nursing note reviewed.  Constitutional:        General: He is not in acute distress.    Appearance: He is well-developed. He is not diaphoretic.     Comments: Well-appearing and in no distress  HENT:     Head: Normocephalic and atraumatic.  Eyes:     General:        Right eye: No discharge.        Left eye: No discharge.     Pupils: Pupils are equal, round, and reactive to light.  Cardiovascular:     Rate and Rhythm: Normal rate and regular rhythm.     Heart sounds: Normal heart sounds.  Pulmonary:     Effort: Pulmonary effort is normal. No respiratory distress.     Breath sounds:  Normal breath sounds. No wheezing or rales.     Comments: Respirations equal and unlabored, patient able to speak in full sentences, lungs clear to auscultation bilaterally Abdominal:     General: Bowel sounds are normal. There is no distension.     Palpations: Abdomen is soft. There is no mass.     Tenderness: There is abdominal tenderness in the left lower quadrant. There is no guarding.     Comments: Abdomen is soft, nondistended, bowel sounds present throughout, there is tenderness focally in the left lower quadrant without guarding, all other quadrants nontender, no CVA tenderness.  Musculoskeletal:        General: No deformity.     Cervical back: Neck supple.  Skin:    General: Skin is warm and dry.     Capillary Refill: Capillary refill takes less than 2 seconds.  Neurological:     Mental Status: He is alert.     Coordination: Coordination normal.     Comments: Speech is clear, able to follow commands Moves extremities without ataxia, coordination intact  Psychiatric:        Mood and Affect: Mood normal.        Behavior: Behavior normal.     ED Results / Procedures / Treatments   Labs (all labs ordered are listed, but only abnormal results are displayed) Labs Reviewed  COMPREHENSIVE METABOLIC PANEL - Abnormal; Notable for the following components:      Result Value   Creatinine, Ser 1.52 (*)    Calcium 8.7 (*)    Total  Bilirubin 1.4 (*)    GFR, Estimated 55 (*)    All other components within normal limits  URINALYSIS, ROUTINE W REFLEX MICROSCOPIC - Abnormal; Notable for the following components:   Color, Urine AMBER (*)    APPearance CLOUDY (*)    Hgb urine dipstick LARGE (*)    Ketones, ur 5 (*)    Protein, ur 100 (*)    RBC / HPF >50 (*)    Bacteria, UA MANY (*)    All other components within normal limits  URINE CULTURE  LIPASE, BLOOD  CBC WITH DIFFERENTIAL/PLATELET    EKG None  Radiology CT Abdomen Pelvis W Contrast  Result Date: 12/28/2019 CLINICAL DATA:  Left lower quadrant pain and diarrhea. EXAM: CT ABDOMEN AND PELVIS WITH CONTRAST TECHNIQUE: Multidetector CT imaging of the abdomen and pelvis was performed using the standard protocol following bolus administration of intravenous contrast. CONTRAST:  37mL OMNIPAQUE IOHEXOL 300 MG/ML  SOLN COMPARISON:  None. FINDINGS: Lower chest: Partially visualized circumscribed 7 mm nodule in the right lung base, image 1/26, sequence 4. The lungs otherwise clear. Hepatobiliary: Scattered few mm too small to be actually characterize hypoattenuated lesions throughout the liver. Post cholecystectomy. No biliary ductal dilation. Pancreas: Unremarkable. No pancreatic ductal dilatation or surrounding inflammatory changes. Spleen: Normal in size without focal abnormality. Adrenals/Urinary Tract: Normal adrenal glands. Bilateral too small to be actually characterize hypoattenuated lesions throughout both kidneys, statistically likely representing cysts. Normal urinary bladder. Stomach/Bowel: Normal appearance of the stomach and small bowel. Normal appendix. The colon is decompressed. Long segment of circumferential mucosal thickening of the descending and sigmoid colon. While there are scattered diverticula throughout this part of the colon, the inflammatory changes are more diffuse than usually seen with acute diverticulitis, and therefore this may represent different  form of colitis. Vascular/Lymphatic: No significant vascular findings are present. No enlarged abdominal or pelvic lymph nodes. Shotty mesenteric lymph nodes in the right lower quadrant  of the abdomen, nonspecific. Reproductive: Prostate is not enlarged. Other: No abdominal wall hernia or abnormality. No abdominopelvic ascites. Musculoskeletal: 1.2 cm sclerotic focus in the left sacrum, slightly enlarged when compared to CT scan dated December 24, 2015. Stable sclerotic focus in the left pubic ramus. IMPRESSION: 1. Long segment of circumferential mucosal thickening of the descending and sigmoid colon. While there are scattered diverticula throughout this part of the colon, the inflammatory changes are more diffuse than usually seen with acute diverticulitis, and therefore this may represent a different form of colitis. Colonic malignancy is considered less likely, however correlation with colonoscopy after resolution of patient's symptoms is recommended. 2. Partially visualized circumscribed 7 mm right lung base pulmonary nodule. Further evaluation with chest CT is recommended. 3. 1.2 cm sclerotic focus in the left sacrum, slightly enlarged when compared to CT scan dated December 24, 2015. In the absence of malignancy capable of producing sclerotic lesions, this may represent a bone island, however it's interval enlargement warrants short-term follow-up, or further characterization with a bone scan. Electronically Signed   By: Fidela Salisbury M.D.   On: 12/28/2019 12:15    Procedures Procedures (including critical care time)  Medications Ordered in ED Medications  sodium chloride 0.9 % bolus 500 mL (0 mLs Intravenous Stopped 12/28/19 1152)  morphine 4 MG/ML injection 4 mg (4 mg Intravenous Given 12/28/19 1055)  ondansetron (ZOFRAN) injection 4 mg (4 mg Intravenous Given 12/28/19 1053)  iohexol (OMNIPAQUE) 300 MG/ML solution 80 mL (80 mLs Intravenous Contrast Given 12/28/19 1133)    ED Course  I have  reviewed the triage vital signs and the nursing notes.  Pertinent labs & imaging results that were available during my care of the patient were reviewed by me and considered in my medical decision making (see chart for details).    MDM Rules/Calculators/A&P                          Patient presents to the ED with complaints of abdominal pain. Patient nontoxic appearing, in no apparent distress, vitals WNL . On exam patient tender to palpation in the left lower quadrant, no peritoneal signs. Will evaluate with labs and CT abdomen pelvis. Analgesics, anti-emetics, and fluids administered.   ER work-up reviewed:  CBC: No leukocytosis, normal hemoglobin CMP: No significant electrolyte derangements, renal function at baseline, LFTs unremarkable Lipase: WNL UA: UA with large amounts of hemoglobin greater than 50 RBCs, bacteria present, patient has known IgA nephropathy that tends to flareup when he is ill, he is not having any current urinary symptoms, will send urine for culture but will not treat with any additional antibiotics at this time  Imaging: Long segment of circumferential mucosal thickening of the descending and sigmoid colon, there are scattered diverticula throughout this portion of the colon, but these inflammatory changes could be more indicative of a different form of colitis such as inflammatory or infectious.:  Malignancy is less likely, but do recommend close follow-up with colonoscopy to ensure changes are resolving.  Incidental findings of pulmonary nodule and sclerotic focus of the sacrum discussed with patient as well.  Patient did have some chest tightness after receiving morphine, EKG was unremarkable and symptoms have since resolved, patient does not think he is ever had this medication before and I suspect medication reaction.  Will avoid in the future and this has been added to patient's allergy list.  Pain is improved and patient is tolerating p.o. here in the emergency  department.  Will treat with Augmentin for colitis versus diverticulitis, pain medication prescribed as well.  Stressed importance of close follow-up with patient's PCP and/or GI doctor and provided strict return precautions.  Patient expresses understanding and agreement with plan.  Discharged home in good condition.  Final Clinical Impression(s) / ED Diagnoses Final diagnoses:  Colitis  LLQ pain  Hematuria, unspecified type    Rx / DC Orders ED Discharge Orders         Ordered    amoxicillin-clavulanate (AUGMENTIN) 875-125 MG tablet  2 times daily        12/28/19 1247    oxyCODONE-acetaminophen (PERCOCET) 5-325 MG tablet  Every 6 hours PRN        12/28/19 1247           Jacqlyn Larsen, PA-C 12/28/19 1651    Isla Pence, MD 12/30/19 1505

## 2019-12-28 NOTE — Discharge Instructions (Addendum)
Your CT scan shows colitis, could also be diverticulitis.  Regardless we will treat with antibiotics, take twice daily with food on your stomach.  You can use prescribed pain medication as needed.  Make sure you are drinking plenty of liquids and eating a bland diet to help combat diarrhea.  Is importantly follow-up with your primary care doctor and GI doctor regarding the symptoms.  If you develop fevers, worsening pain, blood in your stool or any other new or concerning symptoms return for reevaluation.    Your CT scan did show a 7 mm right pulmonary nodule, it also shows a 1.2 cm sclerotic focus in your left sacrum, this was seen on previous CT in 2017 but is slightly larger and you should follow-up with your PCP regarding these findings.

## 2019-12-30 LAB — URINE CULTURE: Culture: NO GROWTH

## 2020-01-12 ENCOUNTER — Ambulatory Visit: Payer: BC Managed Care – PPO | Admitting: Nurse Practitioner

## 2020-01-12 ENCOUNTER — Encounter: Payer: Self-pay | Admitting: Nurse Practitioner

## 2020-01-12 VITALS — BP 118/76 | HR 56 | Ht 72.0 in | Wt 220.4 lb

## 2020-01-12 DIAGNOSIS — K529 Noninfective gastroenteritis and colitis, unspecified: Secondary | ICD-10-CM

## 2020-01-12 NOTE — Patient Instructions (Addendum)
If you are age 51 or older, your body mass index should be between 23-30. Your Body mass index is 29.89 kg/m. If this is out of the aforementioned range listed, please consider follow up with your Primary Care Provider.  If you are age 28 or younger, your body mass index should be between 19-25. Your Body mass index is 29.89 kg/m. If this is out of the aformentioned range listed, please consider follow up with your Primary Care Provider.   Please follow up with PCP about bone lesion on recent CT scan.    Follow up as needed.

## 2020-01-12 NOTE — Progress Notes (Signed)
ASSESSMENT AND PLAN    # 49 yo male with recent diarrhea / left sided colitis on CT scan showing long segment of circumferential mucosal thickening of the descending and sigmoid colon. Pain and diarrhea resolved on antibiotics, suspect this was infectious colitis. Following episode of diverticulitis in Feb 2019 patient had a  colonoscopy remarkable for diverticulosis and several small tubular adenomas. He is due for 3 year recall colonoscopy Feb 2022.   # Bone lesion on CT scan - 1.2 cm sclerotic focus in the left sacrum, slightly enlarged when compared to CT scan dated December 24, 2015. In the absence of malignancy capable of producing sclerotic lesions, this may represent a bone island, however it's interval enlargement warrants short-term follow-up.  --I discussed findings with the patient.  I told him that I would fax a copy of this CT scan to hisPCP in [place].  I ask that the PCPl review the scan to see if further evaluation is needed.  Patient put a note on his calendar to call his PCP sometime in the next 7 to 10 days to discuss if further work-up is needed  # Hx of recurrent diverticulitis ( 2017, 2019).   # IgA Nephrology. Followed by Dr. Justin Mend.  UA in the ED with large amount of RBCs.  Urine culture negative.   # Atrial fibrillation, on chronic Xarelto    HISTORY OF PRESENT ILLNESS     Primary Gastroenterologist : Oretha Caprice, MD      Chief Complaint :  Follow up   Jerry Richards is a 49 y.o. 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  He felt lower abdominal pressure similar to when he had diverticulitis but the pain was a little different  and diarrhea was new. No blood in stool. No N/V. No fever. Went to to ED, CBC normal. CT Scan suggested left sided coliits. He was prescribed Augmentin.  Many RBCs and bacteria in urine.  Culture turned out negative.  Patient has IgA nephropathy.  He says that when he is ill with other problems that he often concurrently sees blood in his urine.  Patient is followed by Dr. Justin Mend. Within a couple of days on antibiotics his symptmom significantly improved.   Pain has now totally resolved. BMs back to normal.   Patient has been trying to lose weight. He was eating a lot of vegetables and fruits before the onset of diarrhea and LLQ pain. No contact with other with similar GI issues.  Patient going to [place] in January. He inquires about taking antibiotics with him in case of diarrhea illness.     Data Reviewed: 12/28/19 ED Creatinine 1.52 T bili 1.4, remainder of liver tests normal CBC normal  Previous Endoscopic Evaluations / Pertinent Studies:    12/28/19 CT scan w/ contrast Long segment of circumferential mucosal thickening of the descending and sigmoid colon. While there are scattered diverticula throughout this part of the colon, the inflammatory changes are more diffuse than usually seen with acute diverticulitis, and therefore this may represent a different form of colitis. Colonic malignancy is considered less likely, however correlation with colonoscopy after resolution of patient's symptoms is recommended. 2. Partially visualized circumscribed 7 mm right lung  base pulmonary nodule. Further evaluation with chest CT is recommended. 3. 1.2 cm sclerotic focus in the left sacrum, slightly enlarged when compared to CT scan dated December 24, 2015. In the absence of malignancy capable of producing sclerotic lesions, this may represent a bone island, however it's interval enlargement warrants short-term follow-up, or further characterization with a bone scan.  February 2019  colonoscopy for evaluation of abnormal CT scan. --Diverticulosis --4 polyps ranging 6 to 8 mm in size --Path compatible with tubular adenoma --3-year recall colonoscopy recommended   Past Medical History:  Diagnosis Date  . Anxiety   . Atrial fibrillation ([place])    a. s/p RFCA x 2 (10/26/2011, 04/15/2012)  . Atrial flutter ([place])   . Chronic kidney disease   . Clotting disorder ([place])   . Current smoker   . 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 LA thrombus   . Non-ischemic cardiomyopathy ([place])   . OSA on CPAP   . Sinus trouble   . Sleep apnea   . Systolic heart failure (HCC)    EF 20-25%-> 50-55% in 10/2011. MOST RECENT ECHO (03/2012) EF= 66%    Current Medications, Allergies, Past Surgical History, Family History and Social History were reviewed in [employer] record.   Current Outpatient Medications  Medication Sig Dispense Refill  . losartan (COZAAR) 50 MG tablet Take 50 mg by mouth daily.  6  . metoprolol tartrate (LOPRESSOR) 50 MG tablet Take 1 tablet (50 mg total) by mouth as needed. 60 tablet 3  . Rivaroxaban (XARELTO) 20 MG TABS Take 1 tablet by mouth daily.     Current Facility-Administered Medications  Medication Dose Route Frequency Provider Last Rate Last Admin  . 0.9 %  sodium chloride infusion  500 mL Intravenous Once Milus Banister, MD        Review of Systems: No chest pain. No shortness of breath. No urinary complaints.   PHYSICAL EXAM :    Wt Readings from Last 3 Encounters:  01/12/20 220 lb 6 oz (100 kg)  12/28/19 212 lb (96.2 kg)  06/03/19 223 lb (101.2 kg)    BP 118/76 (BP Location: Left Arm, Patient Position: Sitting, Cuff Size: Normal)   Pulse (!) 56   Ht 6' (1.829 m)   Wt 220 lb 6 oz (100 kg)   SpO2 96%   BMI 29.89 kg/m  Constitutional:  Pleasant male in no acute distress. Psychiatric: Normal mood and affect. Behavior is normal. EENT: Pupils normal.  Conjunctivae are  normal. No scleral icterus. Neck supple.  Cardiovascular: Normal rate, regular rhythm. No edema Pulmonary/chest: Effort normal and breath sounds normal. No wheezing, rales or rhonchi. Abdominal: Soft, nondistended, nontender. Bowel sounds active throughout. There are no masses palpable. No hepatomegaly. Neurological: Alert and oriented to person place and time. Skin: Skin is warm and dry. No rashes noted.  Tye Savoy, NP  01/12/2020, 9:04 AM  Cc:   [name], MD

## 2020-01-12 NOTE — Progress Notes (Signed)
I agree with the above note, plan 

## 2020-01-21 MED FILL — XARELTO 20 MG TABLET: 20 | 30 days supply | Qty: 30 | Fill #5

## 2020-02-19 ENCOUNTER — Other Ambulatory Visit (HOSPITAL_COMMUNITY): Payer: Self-pay | Admitting: Family Medicine

## 2020-02-19 MED FILL — XARELTO 20 MG TABLET: 20 | 90 days supply | Qty: 90 | Fill #0

## 2020-05-24 MED FILL — XARELTO 20 MG TABLET: 20 | 90 days supply | Qty: 90 | Fill #1

## 2020-08-12 ENCOUNTER — Ambulatory Visit: Payer: BC Managed Care – PPO | Admitting: Nurse Practitioner

## 2020-08-13 ENCOUNTER — Ambulatory Visit: Payer: BC Managed Care – PPO | Admitting: Gastroenterology

## 2020-08-13 ENCOUNTER — Encounter: Payer: Self-pay | Admitting: Gastroenterology

## 2020-08-13 VITALS — BP 110/70 | HR 64 | Ht 71.26 in | Wt 223.0 lb

## 2020-08-13 DIAGNOSIS — R1032 Left lower quadrant pain: Secondary | ICD-10-CM

## 2020-08-13 DIAGNOSIS — K5732 Diverticulitis of large intestine without perforation or abscess without bleeding: Secondary | ICD-10-CM | POA: Diagnosis not present

## 2020-08-13 DIAGNOSIS — Z7901 Long term (current) use of anticoagulants: Secondary | ICD-10-CM | POA: Diagnosis not present

## 2020-08-13 DIAGNOSIS — Z860100 Personal history of colon polyps, unspecified: Secondary | ICD-10-CM | POA: Insufficient documentation

## 2020-08-13 MED ORDER — METRONIDAZOLE 500 MG PO TABS: 500.0000 mg | ORAL_TABLET | Freq: Three times a day (TID) | ORAL | 0 refills | Status: AC

## 2020-08-13 MED ORDER — CIPROFLOXACIN HCL 500 MG PO TABS: 500.0000 mg | ORAL_TABLET | Freq: Two times a day (BID) | ORAL | 0 refills | Status: AC

## 2020-08-13 MED ORDER — NA SULFATE-K SULFATE-MG SULF 17.5-3.13-1.6 GM/177ML PO SOLN: 1.0000 | Freq: Once | ORAL | 0 refills | Status: AC

## 2020-08-13 NOTE — Patient Instructions (Addendum)
We have sent the following medications to your pharmacy for you to pick up at your convenience: Cipro 500 mg twice daily for 7 days. Flagyl 500 mg three times daily for 7 days  No Alcohol while on antibiotics.  Call back if symptoms recur.   You have been scheduled for a colonoscopy. Please follow written instructions given to you at your visit today.  Please pick up your prep supplies at the pharmacy within the next 1-3 days. If you use inhalers (even only as needed), please bring them with you on the day of your procedure.  If you are age 51 or older, your body mass index should be between 23-30. Your Body mass index is 30.88 kg/m. If this is out of the aforementioned range listed, please consider follow up with your Primary Care Provider.  If you are age 80 or younger, your body mass index should be between 19-25. Your Body mass index is 30.88 kg/m. If this is out of the aformentioned range listed, please consider follow up with your Primary Care Provider.   __________________________________________________________  The [facility] providers would like to encourage you to use [facility] to communicate with providers for non-urgent requests or questions.  Due to long hold times on the telephone, sending your provider a message by [facility] may be a faster and more efficient way to get a response.  Please allow 48 business hours for a response.  Please remember that this is for non-urgent requests.

## 2020-08-13 NOTE — Progress Notes (Signed)
08/13/2020 Jerry Richards [id number] February 25, 1971   HISTORY OF PRESENT ILLNESS:  This is a 50 year old male who is a patient of Dr. Ardis Hughs.  He is here today to discuss and schedule his colonoscopy.  He also reports that for about the past 10 days he has been having left lower quadrant/left groin pain.  He says that it has worsened over the past 2 to 3 days.  He rates it about a 4-5 out of 10 on the pain scale.  He says that overall he just feels kind of lousy.  Has history of diverticulitis in 2017 and 2019.  Reports responding well to Cipro and Flagyl in the past.   Colonoscopy 03/2017 showed the following:  - Four semi-pedunculated polyps were found in the transverse colon and ascending colon. The polyps were 6 to 8 mm in size. These polyps were removed with a cold snare. Resection and retrieval were complete. - A few small-mouthed diverticula were found in the left colon (without signs of chronic inflammation). - The exam was otherwise without abnormality on direct and retroflexion view.  Pathology showed tubular adenomas.  Repeat was recommended in 3 years.    He is on Xarelto for history of atrial fibrillation.  This is prescribed by his cardiologist, Dr. Lovena Le.  Past Medical History:  Diagnosis Date   Anxiety    Atrial fibrillation [facility])    a. s/p RFCA x 2 (10/26/2011, 04/15/2012)   Atrial flutter (HCC)    Chronic kidney disease    Clotting disorder ([place])    Colitis    Current smoker    Diverticulitis    Finger near amputation, left    Hyperthyroidism    Secondary to amiodarone   LA thrombus    Non-ischemic cardiomyopathy ([place])    OSA on CPAP    Sinus trouble    Sleep apnea    Systolic heart failure ([place])    EF 20-25%-> 50-55% in 10/2011. MOST RECENT ECHO (03/2012) EF= 66%   Past Surgical History:  Procedure Laterality Date   ATRIAL ABLATION SURGERY     CARDIAC CATHETERIZATION  08/2011   [facility]   CHOLECYSTECTOMY     FINGER SURGERY     LEFT HEART  CATHETERIZATION WITH CORONARY ANGIOGRAM N/A 09/12/2011   Procedure: LEFT HEART CATHETERIZATION WITH CORONARY ANGIOGRAM;  Surgeon: Hillary Bow, MD;  Location: [facility] CATH LAB;  Service: Cardiovascular;  Laterality: N/A;   VASECTOMY      reports that he has quit smoking. His smoking use included cigarettes. He has a 140.00 pack-year smoking history. He has never used smokeless tobacco. He reports current alcohol use. He reports that he does not use drugs. family history includes Atrial fibrillation in his father, paternal aunt, and paternal uncle; Cancer in his father and mother; Coronary artery disease in his paternal grandfather and paternal grandmother; Diabetes in his maternal grandmother; Healthy in his mother; Heart attack in his paternal grandfather and paternal grandmother; Heart disease in his paternal grandfather and paternal grandmother. Allergies  Allergen Reactions   Amiodarone     AMIODARONE ANALOGUES Hyperthyroidism   Morphine And Related     Chest pressure/palpitations      Outpatient Encounter Medications as of 08/13/2020  Medication Sig   metoprolol tartrate (LOPRESSOR) 50 MG tablet Take 50 mg by mouth as needed.   rivaroxaban (XARELTO) 20 MG TABS tablet TAKE 1 TABLET BY MOUTH ONCE DAILY.   losartan (COZAAR) 50 MG tablet Take 50 mg by mouth daily. (  Patient not taking: Reported on 08/13/2020)   [DISCONTINUED] metoprolol tartrate (LOPRESSOR) 50 MG tablet Take 1 tablet (50 mg total) by mouth as needed.   [DISCONTINUED] rivaroxaban (XARELTO) 20 MG TABS tablet TAKE 1 TABLET BY MOUTH ONCE DAILY   [DISCONTINUED] Rivaroxaban (XARELTO) 20 MG TABS Take 1 tablet by mouth daily.   Facility-Administered Encounter Medications as of 08/13/2020  Medication   0.9 %  sodium chloride infusion     REVIEW OF SYSTEMS  : All other systems reviewed and negative except where noted in the History of Present Illness.   PHYSICAL EXAM: BP 110/70 (BP Location: Left Arm, Patient Position: Sitting,  Cuff Size: Normal)   Pulse 64   Ht 5' 11.26" (1.81 m)   Wt 223 lb (101.2 kg)   BMI 30.88 kg/m  General: Well developed white male in no acute distress Head: Normocephalic and atraumatic Eyes:  Sclerae anicteric, conjunctiva pink. Ears: Normal auditory acuity Lungs: Clear throughout to auscultation; no W/R/R. Heart: Regular rate and rhythm; no M/R/G. Abdomen: Soft, non-distended.  BS present.  LLQ/left groin TTP. Rectal:  Will be done at the time of colonoscopy. Musculoskeletal: Symmetrical with no gross deformities  Skin: No lesions on visible extremities Extremities: No edema  Neurological: Alert oriented x 4, grossly non-focal Psychological:  Alert and cooperative. Normal mood and affect  ASSESSMENT AND PLAN: *Left lower quadrant/left groin pain: Has history of diverticulitis in 2017 and 2019 as well as an episode of suspected infectious colitis in 11/2019.  These symptoms have been present for about the past 10 days or so, worsening for the past 2 or 3 days.  We will treat with 7 days of Cipro 500 mg twice daily and Flagyl 500 mg 3 times daily.  No alcohol while on the medications and for 3 days following the medications.  Lots of fluids and low residue diet. *Personal history of colon polyps: Last colonoscopy February 2019 at which time he had several polyps removed that were tubular adenomas.  Repeat was recommended a 3-year interval.  We will schedule with Dr. Ardis Hughs several weeks out since he is being treated for diverticulitis. *Chronic anticoagulation for history of atrial fibrillation:  Will hold Xarelto for one day prior to endoscopic procedures - will instruct when and how to resume after procedure. Benefits and risks of procedure explained including risks of bleeding, perforation, infection, missed lesions, reactions to medications and possible need for hospitalization and surgery for complications. Additional rare but real risk of stroke or other vascular clotting events off of  Xarelto also explained and need to seek urgent help if any signs of these problems occur. Will communicate by phone or EMR with patient's  prescribing provider, Dr. Lovena Le, to confirm that holding Xarelto is reasonable in this case.     CC:  [name], MD

## 2020-08-15 NOTE — Progress Notes (Signed)
I agree with the above note, plan 

## 2020-08-17 ENCOUNTER — Encounter: Payer: Self-pay | Admitting: Internal Medicine

## 2020-08-17 ENCOUNTER — Ambulatory Visit: Payer: BC Managed Care – PPO | Admitting: Internal Medicine

## 2020-08-17 ENCOUNTER — Other Ambulatory Visit: Payer: Self-pay

## 2020-08-17 VITALS — BP 110/80 | HR 51 | Ht 72.0 in | Wt 225.4 lb

## 2020-08-17 DIAGNOSIS — I4811 Longstanding persistent atrial fibrillation: Secondary | ICD-10-CM | POA: Diagnosis not present

## 2020-08-17 NOTE — Patient Instructions (Signed)
Medication Instructions:  Your physician recommends that you continue on your current medications as directed. Please refer to the Current Medication list given to you today.  *If you need a refill on your cardiac medications before your next appointment, please call your pharmacy*   Lab Work: NONE   If you have labs (blood work) drawn today and your tests are completely normal, you will receive your results only by: . MyChart Message (if you have MyChart) OR . A paper copy in the mail If you have any lab test that is abnormal or we need to change your treatment, we will call you to review the results.   Testing/Procedures: NONE    Follow-Up: At [facility], you and your health needs are our priority.  As part of our continuing mission to provide you with exceptional heart care, we have created designated Provider Care Teams.  These Care Teams include your primary Cardiologist (physician) and Advanced Practice Providers (APPs -  Physician Assistants and Nurse Practitioners) who all work together to provide you with the care you need, when you need it.  We recommend signing up for the patient portal called "MyChart".  Sign up information is provided on this After Visit Summary.  MyChart is used to connect with patients for Virtual Visits (Telemedicine).  Patients are able to view lab/test results, encounter notes, upcoming appointments, etc.  Non-urgent messages can be sent to your provider as well.   To learn more about what you can do with MyChart, go to [email or web address].    Your next appointment:   1 year(s)  The format for your next appointment:   In Person  Provider:   Gregg Taylor, MD   Other Instructions Thank you for choosing [facility]!    

## 2020-08-17 NOTE — Progress Notes (Signed)
HPI [name] returns today for followup. He is a pleasant 50 yo man with a h/o persistent atrial fib, and episodic excessive ETOH use, s/p 3 different atrial fib ablations. He has returned back to NSR. His atrial fib appears to be related to ETOH intake. He denies chest pain. He remains active. No sob.  Allergies  Allergen Reactions   Amiodarone     AMIODARONE ANALOGUES Hyperthyroidism   Morphine And Related     Chest pressure/palpitations     Current Outpatient Medications  Medication Sig Dispense Refill   ciprofloxacin (CIPRO) 500 MG tablet Take 1 tablet (500 mg total) by mouth 2 (two) times daily for 7 days. 14 tablet 0   losartan (COZAAR) 50 MG tablet Take 50 mg by mouth daily.  6   metoprolol tartrate (LOPRESSOR) 50 MG tablet Take 50 mg by mouth as needed.     metroNIDAZOLE (FLAGYL) 500 MG tablet Take 1 tablet (500 mg total) by mouth 3 (three) times daily for 7 days. 21 tablet 0   rivaroxaban (XARELTO) 20 MG TABS tablet TAKE 1 TABLET BY MOUTH ONCE DAILY. 30 tablet 6   Current Facility-Administered Medications  Medication Dose Route Frequency Provider Last Rate Last Admin   0.9 %  sodium chloride infusion  500 mL Intravenous Once Milus Banister, MD         Past Medical History:  Diagnosis Date   Anxiety    Atrial fibrillation [facility])    a. s/p RFCA x 2 (10/26/2011, 04/15/2012)   Atrial flutter (HCC)    Chronic kidney disease    Clotting disorder ([place])    Colitis    Current smoker    Diverticulitis    Finger near amputation, left    Hyperthyroidism    Secondary to amiodarone   LA thrombus    Non-ischemic cardiomyopathy ([place])    OSA on CPAP    Sinus trouble    Sleep apnea    Systolic heart failure ([place])    EF 20-25%-> 50-55% in 10/2011. MOST RECENT ECHO (03/2012) EF= 66%    ROS:   All systems reviewed and negative except as noted in the HPI.   Past Surgical History:  Procedure Laterality Date   ATRIAL ABLATION SURGERY     CARDIAC CATHETERIZATION   08/2011   [facility]   CHOLECYSTECTOMY     FINGER SURGERY     LEFT HEART CATHETERIZATION WITH CORONARY ANGIOGRAM N/A 09/12/2011   Procedure: LEFT HEART CATHETERIZATION WITH CORONARY ANGIOGRAM;  Surgeon: Hillary Bow, MD;  Location: [facility] CATH LAB;  Service: Cardiovascular;  Laterality: N/A;   VASECTOMY       Family History  Problem Relation Age of Onset   Atrial fibrillation Father        age 51   Cancer Father        melanoma   Healthy Mother        age 93   Cancer Mother        melanoma   Coronary artery disease Paternal Grandmother    Heart disease Paternal Grandmother    Heart attack Paternal Grandmother    Coronary artery disease Paternal Grandfather    Heart disease Paternal Grandfather    Heart attack Paternal Grandfather    Atrial fibrillation Paternal Aunt    Atrial fibrillation Paternal Uncle    Diabetes Maternal Grandmother    Colon cancer Neg Hx    Esophageal cancer Neg Hx    Ovarian cancer Neg Hx  Stomach cancer Neg Hx      Social History   Socioeconomic History   Marital status: Married    Spouse name: Not on file   Number of children: Not on file   Years of education: Not on file   Highest education level: Not on file  Occupational History   Occupation: SELF EMPLOYED    Comment: Nurse, 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  Tobacco Use   Smoking status: Former    Packs/day: 7.00    Years: 20.00    Pack years: 140.00    Types: Cigarettes   Smokeless tobacco: Never  Vaping Use   Vaping Use: Never used  Substance and Sexual Activity   Alcohol use: Yes    Comment: drinks socially   Drug use: No   Sexual activity: Yes  Other Topics Concern   Not on file  Social History Narrative   Has 2 children   Social Determinants of Health   Financial Resource Strain: Not on file  Food Insecurity: Not on file  Transportation Needs: Not on file  Physical Activity: Not on file  Stress: Not on file  Social Connections: Not on file  Intimate Partner Violence: Not on  file     BP 110/80   Pulse (!) 51   Ht 6' (1.829 m)   Wt 225 lb 6.4 oz (102.2 kg)   SpO2 94%   BMI 30.57 kg/m   Physical Exam:  Well appearing NAD HEENT: Unremarkable Neck:  No JVD, no thyromegally Lymphatics:  No adenopathy Back:  No CVA tenderness Lungs:  Clear with no wheezes HEART:  Regular rate rhythm, no murmurs, no rubs, no clicks Abd:  soft, positive bowel sounds, no organomegally, no rebound, no guarding Ext:  2 plus pulses, no edema, no cyanosis, no clubbing Skin:  No rashes no nodules Neuro:  CN II through XII intact, motor grossly intact   Assess/Plan:  1. Atrial fib - I discussed the importance of less ETOH. He has been prescribed short acting as needed metoprolol to help control his ventricular rate when he goes into atrial fib. 2. Tachy induced CM - his repeat EF several months ago demonstrated preserved LV function. He will conitnue his current meds. 3. ETOH over use - this is in remission.   Jerry Richards.D.

## 2020-08-26 ENCOUNTER — Other Ambulatory Visit (HOSPITAL_COMMUNITY): Payer: Self-pay | Admitting: Family Medicine

## 2020-08-26 ENCOUNTER — Other Ambulatory Visit (HOSPITAL_COMMUNITY): Payer: Self-pay

## 2020-08-27 ENCOUNTER — Other Ambulatory Visit (HOSPITAL_COMMUNITY): Payer: Self-pay

## 2020-08-31 ENCOUNTER — Other Ambulatory Visit (HOSPITAL_COMMUNITY): Payer: Self-pay

## 2020-08-31 MED ORDER — XARELTO 20 MG PO TABS
20.0000 mg | ORAL_TABLET | Freq: Every day | ORAL | 0 refills | Status: DC
Start: 2020-08-31 — End: 2020-12-01
  Filled 2020-08-31: qty 90, 90d supply, fill #0

## 2020-10-19 ENCOUNTER — Telehealth: Payer: Self-pay | Admitting: *Deleted

## 2020-10-19 NOTE — Telephone Encounter (Signed)
   Name: Jerry Richards  DOB: [birth year]  MRN: [id number]   Primary Cardiologist: None - sees EP Dr. Lovena Le  Chart reviewed as part of pre-operative protocol coverage. Patient was contacted 10/19/2020 in reference to pre-operative risk assessment for pending surgery as outlined below.  JABRE WASCO was last seen on 07/2020 by Dr. Lovena Le - pertinent CV hx of atrial fib s/p multiple ablations, excessive ETOH use, tachy induced cardiomyopathy with last echo 02/2019 EF 55-60%. I reached out to patient for update on how he is doing. The patient affirms he has been doing well without any new cardiac symptoms. Therefore, the patient would be at acceptable risk for the planned procedure without further cardiovascular testing. The patient was advised that if he develops new symptoms prior to surgery to contact our office to arrange for a follow-up visit, and he verbalized understanding.  Will route to pharm for input on anticoag.  Charlie Pitter, PA-C 10/19/2020, 4:54 PM

## 2020-10-19 NOTE — Telephone Encounter (Signed)
[facility] Pre-operative Risk Assessment     Request for surgical clearance:     Endoscopy Procedure  What type of surgery is being performed?     Colonoscopy  When is this surgery scheduled?     11/26/20  What type of clearance is required ?   Pharmacy  Are there any medications that need to be held prior to surgery and how long? Xarelto 24 hours  Practice name and name of physician performing surgery?      [facility]  What is your office phone and fax number?      Phone- [phone]  [phone]  Anesthesia type (None, local, MAC, general) ?       MAC

## 2020-10-20 NOTE — Telephone Encounter (Signed)
Patient with diagnosis of afib on Xarelto for anticoagulation.    Procedure: colonoscopy Date of procedure: 11/26/20   CHA2DS2-VASc Score = 1  This indicates a 0.6% annual risk of stroke. The patient's score is based upon: CHF History: Yes HTN History: No Diabetes History: No Stroke History: No Vascular Disease History: No Age Score: 0 Gender Score: 0   Also with hx of LA thrombus in 2013 that resolved.    CrCl 49m/min using adjusted body weight Platelet count 168K  Per office protocol, patient can hold Xarelto for 1 day prior to procedure as requested.

## 2020-10-20 NOTE — Telephone Encounter (Signed)
    Patient Name: Jerry Richards  DOB: March 06, 1970 MRN: [id number]  Primary Cardiologist: Dr. Lovena Le, MD   Chart reviewed as part of pre-operative protocol coverage. Given past medical history and time since last visit, based on ACC/AHA guidelines, Jerry Richards would be at acceptable risk for the planned procedure without further cardiovascular testing.   Patient with diagnosis of afib on Xarelto for anticoagulation.     Procedure: colonoscopy Date of procedure: 11/26/20   CHA2DS2-VASc Score = 1  This indicates a 0.6% annual risk of stroke. The patient's score is based upon: CHF History: Yes HTN History: No Diabetes History: No Stroke History: No Vascular Disease History: No Age Score: 0 Gender Score: 0    Also with hx of LA thrombus in 2013 that resolved.    CrCl 13m/min using adjusted body weight Platelet count 168K   Per office protocol, patient can hold Xarelto for 1 day prior to procedure as requested  The patient was advised that if he develops new symptoms prior to surgery to contact our office to arrange for a follow-up visit, and he verbalized understanding.  I will route this recommendation to the requesting party via Epic fax function and remove from pre-op pool.  Please call with questions.  JKathyrn Drown NP 10/20/2020, 2:00 PM

## 2020-11-04 NOTE — Telephone Encounter (Signed)
Patient informed to hold Xarelto and voiced understanding.

## 2020-11-18 ENCOUNTER — Encounter: Payer: Self-pay | Admitting: Gastroenterology

## 2020-11-25 ENCOUNTER — Telehealth: Payer: Self-pay | Admitting: Gastroenterology

## 2020-11-25 MED ORDER — NA SULFATE-K SULFATE-MG SULF 17.5-3.13-1.6 GM/177ML PO SOLN: 1.0000 | Freq: Once | ORAL | 0 refills | Status: AC

## 2020-11-25 NOTE — Telephone Encounter (Signed)
Inbound call from pt stating that he has a procedure tomorrow and he needed his prep sent to [facility] in [place]. Address is [address] Phone number is [phone]. Thank you.

## 2020-11-25 NOTE — Telephone Encounter (Signed)
Script sent to pharmacy and patient informed.

## 2020-11-26 ENCOUNTER — Ambulatory Visit (AMBULATORY_SURGERY_CENTER): Payer: BC Managed Care – PPO | Admitting: Gastroenterology

## 2020-11-26 ENCOUNTER — Encounter: Payer: Self-pay | Admitting: Gastroenterology

## 2020-11-26 ENCOUNTER — Other Ambulatory Visit: Payer: Self-pay

## 2020-11-26 VITALS — BP 96/69 | HR 44 | Temp 98.9°F | Resp 12 | Ht 72.0 in | Wt 225.0 lb

## 2020-11-26 DIAGNOSIS — R1032 Left lower quadrant pain: Secondary | ICD-10-CM

## 2020-11-26 DIAGNOSIS — K5730 Diverticulosis of large intestine without perforation or abscess without bleeding: Secondary | ICD-10-CM | POA: Diagnosis not present

## 2020-11-26 DIAGNOSIS — D125 Benign neoplasm of sigmoid colon: Secondary | ICD-10-CM

## 2020-11-26 DIAGNOSIS — K648 Other hemorrhoids: Secondary | ICD-10-CM

## 2020-11-26 DIAGNOSIS — K635 Polyp of colon: Secondary | ICD-10-CM | POA: Diagnosis not present

## 2020-11-26 DIAGNOSIS — D128 Benign neoplasm of rectum: Secondary | ICD-10-CM

## 2020-11-26 DIAGNOSIS — D127 Benign neoplasm of rectosigmoid junction: Secondary | ICD-10-CM | POA: Diagnosis not present

## 2020-11-26 DIAGNOSIS — K621 Rectal polyp: Secondary | ICD-10-CM

## 2020-11-26 MED ORDER — SODIUM CHLORIDE 0.9 % IV SOLN: 500.0000 mL | Freq: Once | INTRAVENOUS | Status: DC

## 2020-11-26 NOTE — Progress Notes (Signed)
HPI: This is a man with LLQ pains, also h/o adenomatous poliyps   ROS: complete GI ROS as described in HPI, all other review negative.  Constitutional:  No unintentional weight loss   Past Medical History:  Diagnosis Date   Anxiety    Atrial fibrillation ([place])    a. 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 MOST RECENT ECHO (03/2012) EF= 66%    Past Surgical History:  Procedure Laterality Date   ATRIAL ABLATION SURGERY     CARDIAC CATHETERIZATION  08/2011   [facility]   CHOLECYSTECTOMY     FINGER SURGERY     LEFT HEART CATHETERIZATION WITH CORONARY ANGIOGRAM N/A 09/12/2011   Procedure: LEFT HEART CATHETERIZATION WITH CORONARY ANGIOGRAM;  Surgeon: Hillary Bow, MD;  Location: [facility] CATH LAB;  Service: Cardiovascular;  Laterality: N/A;   VASECTOMY      Current Outpatient Medications  Medication Sig Dispense Refill   losartan (COZAAR) 50 MG tablet Take 50 mg by mouth daily.  6   rivaroxaban (XARELTO) 20 MG TABS tablet Take 1 tablet (20 mg total) by mouth daily. 90 tablet 0   Current Facility-Administered Medications  Medication Dose Route Frequency Provider Last Rate Last Admin   0.9 %  sodium chloride infusion  500 mL Intravenous Once Milus Banister, MD       0.9 %  sodium chloride infusion  500 mL Intravenous Once Milus Banister, MD        Allergies as of 11/26/2020 - Review Complete 11/26/2020  Allergen Reaction Noted   Amiodarone  01/03/2013   Morphine and related  12/28/2019    Family History  Problem Relation Age of Onset   Healthy Mother        age 27   Cancer Mother        melanoma   Atrial  fibrillation Father        age 43   Cancer Father        melanoma   Atrial fibrillation Paternal Aunt    Atrial fibrillation Paternal Uncle    Diabetes Maternal Grandmother    Coronary artery disease Paternal Grandmother    Heart disease Paternal Grandmother    Heart attack Paternal Grandmother    Coronary artery disease Paternal Grandfather    Heart disease Paternal Grandfather    Heart attack Paternal Grandfather    Colon cancer Neg Hx    Esophageal cancer Neg Hx    Ovarian cancer Neg Hx    Stomach cancer Neg Hx    Rectal cancer Neg Hx     Social History   Socioeconomic History   Marital status: Married    Spouse name: Not on file   Number of children: Not on file   Years of education: Not on file   Highest education level: Not on file  Occupational History   Occupation: SELF EMPLOYED    Comment: Nurse, 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  Tobacco Use   Smoking status: Former    Packs/day: 7.00  Years: 20.00    Pack years: 140.00    Types: Cigarettes   Smokeless tobacco: Never  Vaping Use   Vaping Use: Never used  Substance and Sexual Activity   Alcohol use: Yes    Comment: drinks socially   Drug use: No   Sexual activity: Yes  Other Topics Concern   Not on file  Social History Narrative   Has 2 children   Social Determinants of Health   Financial Resource Strain: Not on file  Food Insecurity: Not on file  Transportation Needs: Not on file  Physical Activity: Not on file  Stress: Not on file  Social Connections: Not on file  Intimate Partner Violence: Not on file     Physical Exam: BP 102/70   Pulse (!) 51   Temp 98.9 F (37.2 C)   Ht 6' (1.829 m)   Wt 225 lb (102.1 kg)   SpO2 98%   BMI 30.52 kg/m  Constitutional: generally well-appearing Psychiatric: alert and oriented x3 Lungs: CTA bilaterally Heart: no MCR  Assessment and plan: 50 y.o. male with llq pains, h/o polyps  Colonsocopy today  Care is appropriate for the ambulatory  setting.  Owens Loffler, MD [facility] 11/26/2020, 9:13 AM

## 2020-11-26 NOTE — Progress Notes (Signed)
PT taken to PACU. Monitors in place. VSS. Report given to RN. 

## 2020-11-26 NOTE — Progress Notes (Signed)
VS taken by C.W. 

## 2020-11-26 NOTE — Op Note (Signed)
[facility] Patient Name: Jerry Richards Procedure Date: 11/26/2020 9:08 AM MRN: [id number] Endoscopist: Milus Banister , MD Age: 50 Referring MD:  Date of Birth: 04-28-1970 Gender: Male Account #: [id number] Procedure:                Colonoscopy Indications:              High risk colon cancer surveillance: Personal                            history of colonic polyps; Colonoscopy 2019 four                            subCM adenomas, intermittent LLQ pains Medicines:                Monitored Anesthesia Care Procedure:                Pre-Anesthesia Assessment:                           - Prior to the procedure, a History and Physical                            was performed, and patient medications and                            allergies were reviewed. The patient's tolerance of                            previous anesthesia was also reviewed. The risks                            and benefits of the procedure and the sedation                            options and risks were discussed with the patient.                            All questions were answered, and informed consent                            was obtained. Prior Anticoagulants: The patient has                            taken Xarelto (rivaroxaban), last dose was 2 days                            prior to procedure. ASA Grade Assessment: III - A                            patient with severe systemic disease. After                            reviewing the risks and benefits, the patient was  deemed in satisfactory condition to undergo the                            procedure.                           After obtaining informed consent, the colonoscope                            was passed under direct vision. Throughout the                            procedure, the patient's blood pressure, pulse, and                            oxygen saturations were monitored continuously. The                             Olympus CF-HQ190L ([id number]) Colonoscope was                            introduced through the anus and advanced to the the                            cecum, identified by appendiceal orifice and                            ileocecal valve. The colonoscopy was performed                            without difficulty. The patient tolerated the                            procedure well. The quality of the bowel                            preparation was good. The ileocecal valve,                            appendiceal orifice, and rectum were photographed. Scope In: 9:22:21 AM Scope Out: 9:31:49 AM Scope Withdrawal Time: 0 hours 7 minutes 53 seconds  Total Procedure Duration: 0 hours 9 minutes 28 seconds  Findings:                 Two sessile polyps were found in the rectum and                            sigmoid colon. The polyps were 2 to 5 mm in size.                            These polyps were removed with a cold snare.                            Resection and retrieval were complete.  Multiple small and large-mouthed diverticula were                            found in the left colon.                           Internal hemorrhoids were found. The hemorrhoids                            were small.                           The exam was otherwise without abnormality on                            direct and retroflexion views. Complications:            No immediate complications. Estimated blood loss:                            None. Estimated Blood Loss:     Estimated blood loss: none. Impression:               - Two 2 to 5 mm polyps in the rectum and in the                            sigmoid colon, removed with a cold snare. Resected                            and retrieved.                           - Diverticulosis in the left colon.                           - Internal hemorrhoids.                           - The examination was otherwise  normal on direct                            and retroflexion views. Recommendation:           - Patient has a contact number available for                            emergencies. The signs and symptoms of potential                            delayed complications were discussed with the                            patient. Return to normal activities tomorrow.                            Written discharge instructions were provided to the  patient.                           - Resume previous diet.                           - Continue present medications. It is safe to                            resume your blood thinner tonight.                           - Await pathology results. Milus Banister, MD 11/26/2020 9:34:30 AM This report has been signed electronically.

## 2020-11-26 NOTE — Patient Instructions (Signed)
YOU HAD AN ENDOSCOPIC PROCEDURE TODAY AT THE [facility]:   Refer to the procedure report that was given to you for any specific questions about what was found during the examination.  If the procedure report does not answer your questions, please call your gastroenterologist to clarify.  If you requested that your care partner not be given the details of your procedure findings, then the procedure report has been included in a sealed envelope for you to review at your convenience later.  **Handouts given on polyps, hemorrhoids and diverticulosis**  YOU SHOULD EXPECT: Some feelings of bloating in the abdomen. Passage of more gas than usual.  Walking can help get rid of the air that was put into your GI tract during the procedure and reduce the bloating. If you had a lower endoscopy (such as a colonoscopy or flexible sigmoidoscopy) you may notice spotting of blood in your stool or on the toilet paper. If you underwent a bowel prep for your procedure, you may not have a normal bowel movement for a few days.  Please Note:  You might notice some irritation and congestion in your nose or some drainage.  This is from the oxygen used during your procedure.  There is no need for concern and it should clear up in a day or so.  SYMPTOMS TO REPORT IMMEDIATELY:  Following lower endoscopy (colonoscopy or flexible sigmoidoscopy):  Excessive amounts of blood in the stool  Significant tenderness or worsening of abdominal pains  Swelling of the abdomen that is new, acute  Fever of 100F or higher  For urgent or emergent issues, a gastroenterologist can be reached at any hour by calling [phone]. Do not use MyChart messaging for urgent concerns.    DIET:  We do recommend a small meal at first, but then you may proceed to your regular diet.  Drink plenty of fluids but you should avoid alcoholic beverages for 24 hours.  ACTIVITY:  You should plan to take it easy for the rest of today and you  should NOT DRIVE or use heavy machinery until tomorrow (because of the sedation medicines used during the test).    FOLLOW UP: Our staff will call the number listed on your records 48-72 hours following your procedure to check on you and address any questions or concerns that you may have regarding the information given to you following your procedure. If we do not reach you, we will leave a message.  We will attempt to reach you two times.  During this call, we will ask if you have developed any symptoms of COVID 19. If you develop any symptoms (ie: fever, flu-like symptoms, shortness of breath, cough etc.) before then, please call [phone].  If you test positive for Covid 19 in the 2 weeks post procedure, please call and report this information to us.    If any biopsies were taken you will be contacted by phone or by letter within the next 1-3 weeks.  Please call us at [phone] if you have not heard about the biopsies in 3 weeks.    SIGNATURES/CONFIDENTIALITY: You and/or your care partner have signed paperwork which will be entered into your electronic medical record.  These signatures attest to the fact that that the information above on your After Visit Summary has been reviewed and is understood.  Full responsibility of the confidentiality of this discharge information lies with you and/or your care-partner.  

## 2020-11-26 NOTE — Progress Notes (Signed)
Called to room to assist during endoscopic procedure.  Patient ID and intended procedure confirmed with present staff. Received instructions for my participation in the procedure from the performing physician.  

## 2020-11-30 ENCOUNTER — Telehealth: Payer: Self-pay

## 2020-11-30 NOTE — Telephone Encounter (Signed)
  Follow up Call-  Call back number 11/26/2020  Post procedure Call Back phone  # [phone]  Permission to leave phone message Yes  Some recent data might be hidden     Patient questions:  Do you have a fever, pain , or abdominal swelling? No. Pain Score  0 *  Have you tolerated food without any problems? Yes.    Have you been able to return to your normal activities? Yes.    Do you have any questions about your discharge instructions: Diet   No. Medications  No. Follow up visit  No.  Do you have questions or concerns about your Care? No.  Actions: * If pain score is 4 or above: No action needed, pain <4.

## 2020-12-01 ENCOUNTER — Other Ambulatory Visit (HOSPITAL_COMMUNITY): Payer: Self-pay

## 2020-12-01 MED ORDER — RIVAROXABAN 20 MG PO TABS
20.0000 mg | ORAL_TABLET | Freq: Every day | ORAL | 0 refills | Status: DC
  Filled 2020-12-01: qty 90, 90d supply, fill #0

## 2020-12-02 ENCOUNTER — Other Ambulatory Visit (HOSPITAL_COMMUNITY): Payer: Self-pay

## 2020-12-05 ENCOUNTER — Encounter: Payer: Self-pay | Admitting: Gastroenterology

## 2020-12-06 ENCOUNTER — Other Ambulatory Visit (HOSPITAL_COMMUNITY): Payer: Self-pay

## 2021-03-10 ENCOUNTER — Other Ambulatory Visit (HOSPITAL_COMMUNITY): Payer: Self-pay

## 2021-03-10 MED ORDER — RIVAROXABAN 20 MG PO TABS
20.0000 mg | ORAL_TABLET | Freq: Every day | ORAL | 0 refills | Status: DC
  Filled 2021-03-10: qty 90, 90d supply, fill #0

## 2021-03-15 ENCOUNTER — Other Ambulatory Visit (HOSPITAL_COMMUNITY): Payer: Self-pay

## 2021-04-26 ENCOUNTER — Telehealth: Payer: Self-pay | Admitting: Neurology

## 2021-04-26 NOTE — Telephone Encounter (Signed)
[facility]) Pt leg drop getting worse, loosing control of his left leg. Would like call from the nurse discuss a sooner appt or working him in.

## 2021-04-28 ENCOUNTER — Other Ambulatory Visit: Payer: Self-pay

## 2021-04-28 ENCOUNTER — Ambulatory Visit: Payer: BC Managed Care – PPO | Admitting: Neurology

## 2021-04-28 ENCOUNTER — Encounter: Payer: Self-pay | Admitting: Neurology

## 2021-04-28 ENCOUNTER — Telehealth: Payer: Self-pay | Admitting: Neurology

## 2021-04-28 VITALS — BP 111/64 | HR 50 | Ht 72.0 in | Wt 214.6 lb

## 2021-04-28 DIAGNOSIS — R292 Abnormal reflex: Secondary | ICD-10-CM | POA: Diagnosis not present

## 2021-04-28 DIAGNOSIS — R269 Unspecified abnormalities of gait and mobility: Secondary | ICD-10-CM | POA: Diagnosis not present

## 2021-04-28 DIAGNOSIS — R29898 Other symptoms and signs involving the musculoskeletal system: Secondary | ICD-10-CM | POA: Diagnosis not present

## 2021-04-28 MED ORDER — BACLOFEN 10 MG PO TABS: 10.0000 mg | ORAL_TABLET | Freq: Three times a day (TID) | ORAL | 0 refills | Status: DC

## 2021-04-28 NOTE — Telephone Encounter (Signed)
Jerry Richards: [id number] (exp. 04/28/21 to 05/27/21). ? ?I sent Olin Hauser and [place] with [facility] imaging to schedule the patient as soon as possible. I also asked if they can get him in soon to scheduled at [facility].  ?

## 2021-04-28 NOTE — Progress Notes (Signed)
[facility]  PATIENT: Jerry Richards DOB: October 11, 1970  REFERRING DOCTOR OR PCP: Florene Route, MD SOURCE: Patient, notes from primary care, imaging report, MRI images personally reviewed.  _________________________________   HISTORICAL  CHIEF COMPLAINT:  Chief Complaint  Patient presents with   New Patient (Initial Visit)    Rm 14, alone. Around Christmas time pt sprained his ankle and since then he has been having issues since then. Currently wearing a boot. Now having issues were knee feels unstable and having a hard time walking. Has lost ROM in foot/toes.     HISTORY OF PRESENT ILLNESS:  I had the pleasure of seeing your patient, Tamel Abel, at [facility] for neurologic consultation regarding his left foot drop and left leg pain  He is a 51 year old man who had the onset of a foot drop 03/05/2021.   He notes that he sprained his ankle right before Christmas.    He worked a lot of overtime at work and was hobbling some.    Around 03/03/2021 he started to have a foot drop and walking was more difficult.    He notes little ability to move the toes or ankle voluntarily.    He denies numbness  He has had hip and back pain.     He notes no bladder or bowel symptoms.  He was in [place] ad had a lot of spasming in the leg last week.     He is having more back apin and right knee pain since the left leg issues started.   He is noting twitching in his toes.  He is wearing a boot as his ankle is rolling which is painful.  However, wearing the boot for a longer period of time which is back.  He is not diabetic.   He has had no similar issues in th past.      His right leg feels strong.    He had no vaccinations in November or December.   No viral syndromes preceding symptoms.      MRI of the lumbar spine 04/12/2021 was personally reviewed and showed minimal retrolisthesis of L5 upon S1 with reduced disc height.  At L2-L3, there is mild spinal  stenosis due to disc bulging and congenitally short pedicles.  There is mild foraminal narrowing and left lateral recess stenosis but no nerve root compression.  At L3-L4, there is moderate spinal stenosis due to congenitally short pedicles, facet hypertrophy, ligamenta flava hypertrophy and disc bulging.  There is mild foraminal narrowing but left greater than right lateral recess stenosis with potential for L4 nerve root compression, more on the left.  At L4-L5, there is mild facet hypertrophy and mild foraminal and lateral recess stenosis but no nerve root compression.   Other: He has atrial fibrillation and nonischemic cardiomyopathy and is on Xarelto.  REVIEW OF SYSTEMS: Constitutional: No fevers, chills, sweats, or change in appetite Eyes: No visual changes, double vision, eye pain Ear, nose and throat: No hearing loss, ear pain, nasal congestion, sore throat Cardiovascular: No chest pain, palpitations Respiratory:  No shortness of breath at rest or with exertion.   No wheezes GastrointestinaI: No nausea, vomiting, diarrhea, abdominal pain, fecal incontinence Genitourinary:  No dysuria, urinary retention or frequency.  No nocturia. Musculoskeletal:  No neck pain, back pain Integumentary: No rash, pruritus, skin lesions Neurological: as above Psychiatric: No depression at this time.  No anxiety Endocrine: No palpitations, diaphoresis, change in appetite, change in weigh or increased thirst  Hematologic/Lymphatic:  No anemia, purpura, petechiae. Allergic/Immunologic: No itchy/runny eyes, nasal congestion, recent allergic reactions, rashes  ALLERGIES: Allergies  Allergen Reactions   Amiodarone     AMIODARONE ANALOGUES Hyperthyroidism   Morphine And Related     Chest pressure/palpitations    HOME MEDICATIONS:  Current Outpatient Medications:    baclofen (LIORESAL) 10 MG tablet, Take 1 tablet (10 mg total) by mouth 3 (three) times daily., Disp: 90 each, Rfl: 0   losartan (COZAAR) 50  MG tablet, Take 50 mg by mouth daily., Disp: , Rfl: 6   rivaroxaban (XARELTO) 20 MG TABS tablet, Take 1 tablet (20 mg total) by mouth daily., Disp: 90 tablet, Rfl: 0  PAST MEDICAL HISTORY: Past Medical History:  Diagnosis Date   Anxiety    Atrial fibrillation ([place])    a. 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 MOST RECENT ECHO (03/2012) EF= 66%    PAST SURGICAL HISTORY: Past Surgical History:  Procedure Laterality Date   ATRIAL ABLATION SURGERY     CARDIAC CATHETERIZATION  08/2011   [facility]   CHOLECYSTECTOMY     FINGER SURGERY     LEFT HEART CATHETERIZATION WITH CORONARY ANGIOGRAM N/A 09/12/2011   Procedure: LEFT HEART CATHETERIZATION WITH CORONARY ANGIOGRAM;  Surgeon: Hillary Bow, MD;  Location: [facility] CATH LAB;  Service: Cardiovascular;  Laterality: N/A;   VASECTOMY      FAMILY HISTORY: Family History  Problem Relation Age of Onset   Healthy Mother        age 23   Cancer Mother        melanoma   Atrial fibrillation Father        age 59   Cancer Father        melanoma   Atrial fibrillation Paternal Aunt    Atrial fibrillation Paternal Uncle    Diabetes Maternal Grandmother    Coronary artery disease Paternal Grandmother    Heart disease Paternal Grandmother    Heart attack Paternal Grandmother    Coronary artery disease Paternal Grandfather    Heart disease Paternal Grandfather    Heart attack Paternal Grandfather    Colon cancer Neg Hx    Esophageal cancer Neg Hx    Ovarian cancer Neg Hx    Stomach cancer Neg Hx    Rectal cancer Neg Hx     SOCIAL HISTORY:  Social History   Socioeconomic History    Marital status: Married    Spouse name: tanya   Number of children: 2   Years of education: Not on file   Highest education level: Some college, no degree  Occupational History   Occupation: SELF EMPLOYED    Comment: Nurse, 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 Occas tea   Social Determinants of Health   Financial Resource Strain: Not on file  Food Insecurity: Not on file  Transportation Needs: Not on file  Physical Activity: Not on file  Stress: Not on file  Social Connections: Not on file  Intimate Partner Violence: Not on file     PHYSICAL EXAM  Vitals:   04/28/21 0937  BP: 111/64  Pulse: (!) 50  Weight: 214 lb 9.6 oz (97.3 kg)  Height: 6' (1.829 m)    Body mass index is 29.1 kg/m.   General: The patient is well-developed and well-nourished and in no acute distress  HEENT:  Head is [place]/AT.  Sclera are anicteric.  Funduscopic exam shows normal optic discs and retinal vessels.  Neck: No carotid bruits are noted.  The neck is nontender.  Cardiovascular: The heart has a regular rate and rhythm with a normal S1 and S2. There were no murmurs, gallops or rubs.    Skin: Extremities are without rash or  edema.  Musculoskeletal:  Back is nontender  Neurologic Exam  Mental status: The patient is alert and oriented x 3 at the time of the examination. The patient has apparent normal recent and remote memory, with an apparently normal attention span and concentration ability.   Speech is normal.  Cranial nerves: Extraocular  movements are full. Pupils are equal, round, and reactive to light and accomodation.  Visual fields are full.  Facial symmetry is present. There is good facial sensation to soft touch bilaterally.Facial strength is normal.  Trapezius and sternocleidomastoid strength is normal. No dysarthria is noted.  The tongue is midline, and the patient has symmetric elevation of the soft palate. No obvious hearing deficits are noted.  Motor:  Muscle bulk is normal.   Tone is normal. Strength is  5 / 5 in both arms and the right leg.  Strength in the left leg is: Adductors, Iliopsoas  5/5 Quadriceps 4+/5 Hamstrings 4/5 EHL 1/5 other toes ankles 0/5  .  Sensory: Sensory testing is intact to pinprick, soft touch and vibration sensation in all 4 extremities.  Coordination: Cerebellar testing reveals good finger-nose-finger and heel-to-shin bilaterally.  Gait and station: Station is normal.   He has a left foot drop and hyperextended left knee while walking . Romberg is negative.   Reflexes: Deep tendon reflexes are symmetric and normal bilaterally in the arms.  He has clonus with 8-10 beats in the left ankle and crossed abductor/spread at the left knee.  DTRs were 3+ on the right leg without spread or clonus..   Plantar responses are flexor on the right and equivocal on the left.    DIAGNOSTIC DATA (LABS, IMAGING, TESTING) - I reviewed patient records, labs, notes, testing and imaging myself where available.  Lab Results  Component Value Date   WBC 7.4 12/28/2019   HGB 15.2 12/28/2019   HCT 44.8 12/28/2019   MCV 89.2 12/28/2019   PLT 168 12/28/2019      Component Value Date/Time   NA 136 12/28/2019 1031   NA 140 03/29/2017 1045   K 3.8 12/28/2019 1031   CL 104 12/28/2019 1031   CO2 22 12/28/2019 1031   GLUCOSE 94  12/28/2019 1031   BUN 17 12/28/2019 1031   BUN 18 03/29/2017 1045   CREATININE 1.52 (H) 12/28/2019 1031   CALCIUM 8.7 (L) 12/28/2019 1031   PROT 6.6 12/28/2019 1031   PROT 7.7  03/29/2017 1045   ALBUMIN 3.7 12/28/2019 1031   ALBUMIN 5.2 03/29/2017 1045   AST 19 12/28/2019 1031   ALT 22 12/28/2019 1031   ALKPHOS 39 12/28/2019 1031   BILITOT 1.4 (H) 12/28/2019 1031   BILITOT 0.6 03/29/2017 1045   GFRNONAA 55 (L) 12/28/2019 1031   GFRAA 56 (L) 03/29/2017 1045        ASSESSMENT AND PLAN  Left leg weakness - Plan: MR THORACIC SPINE WO CONTRAST, MR CERVICAL SPINE WO CONTRAST  Hyperreflexia - Plan: MR THORACIC SPINE WO CONTRAST, MR CERVICAL SPINE WO CONTRAST  Gait disturbance  In summary, Mr. Sudol is a 51 year old man who has had progressive worsening of the left leg strength since early January 2023.  On exam, besides the weakness which is quite severe in the lower leg (would be both peroneal and tibial distributions) he has hyperreflexia which would actually be more concerning for a spinal cord process.  We will check an MRI of the cervical and thoracic spine to rule out either an extrinsic compressive myelopathy or an intrinsic inflammatory myelitis or other intrinsic spinal cord process (stroke, hemorrhage, tumor).  If these exams are normal I would consider an NCV/EMG study to assess for sciatic neuropathy or possibly lumbar plexopathy though the hyperextension of the knee and the hyperreflexia argues against this.  Based on the results of the studies, additional testing may be necessary.  To help with the spasticity I sent in a prescription for baclofen.  He will return to see me after the studies based on the results we will call sooner if new or worsening neurologic symptoms.   Thank you for asking me to see Mr. Gorby.  Please let me know when we have further assistance with him or other patients in the future.  Izzah Pasqua A. Felecia Shelling, MD, [facility] 4/0/9735, 3:29 PM Certified in Neurology, Clinical Neurophysiology, Sleep Medicine and Neuroimaging  [facility] [address] [place], [place] [address] [phone]

## 2021-04-29 ENCOUNTER — Telehealth: Payer: Self-pay | Admitting: Neurology

## 2021-04-29 ENCOUNTER — Ambulatory Visit
Admission: RE | Admit: 2021-04-29 | Discharge: 2021-04-29 | Disposition: A | Discharge status: Home / Self Care | Payer: BC Managed Care – PPO | Source: Ambulatory Visit | Attending: Neurology | Admitting: Neurology

## 2021-04-29 DIAGNOSIS — R292 Abnormal reflex: Secondary | ICD-10-CM

## 2021-04-29 DIAGNOSIS — R29898 Other symptoms and signs involving the musculoskeletal system: Secondary | ICD-10-CM

## 2021-04-29 IMAGING — MR MR THORACIC SPINE W/O CM
4 of 7 series · 21 of 48 positions shown · non-contrast
Comparison: None.

CLINICAL DATA: Mid back pain with left lower extremity weakness.

left leg weakness ad hyperreflexia; Mid-back pain, neuro deficit
left leg weakness and hyper-reflexia.
EXAM:
MRI CERVICAL AND THORACIC SPINE WITHOUT CONTRAST
TECHNIQUE: Multiplanar and multiecho pulse sequences of the cervical spine, to
include the craniocervical junction and cervicothoracic junction,
and the thoracic spine, were obtained without intravenous contrast.

[Series 7: STIR · sagittal · 3.0mm · 1.06mm/px · 3 of 16 slices shown]
[im 1/16]
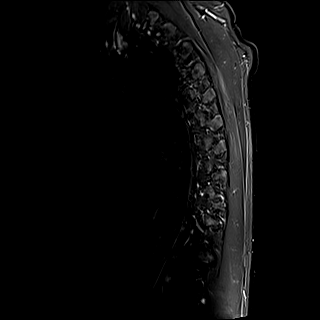
[im 8/16]
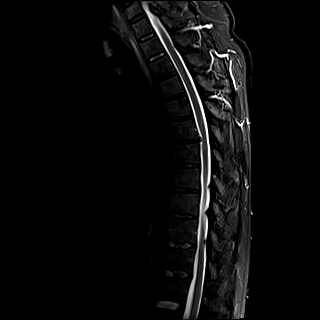
[im 16/16]
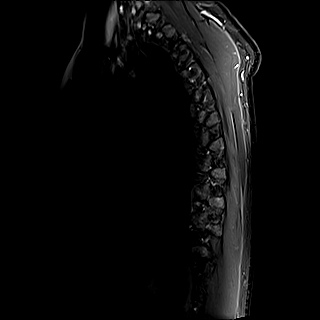

[Series 8: T2 · sagittal · 3.0mm · 0.89mm/px · 5 of 16 slices shown (1 of 2)]
[im 1/16]
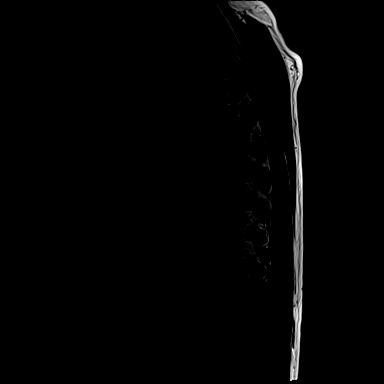
[im 4/16]
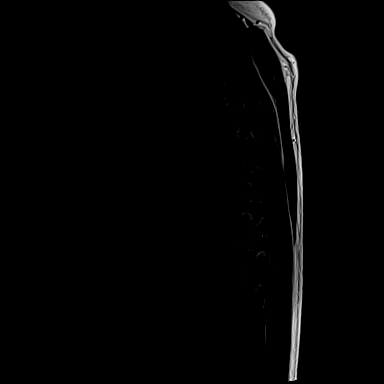
[im 8/16]
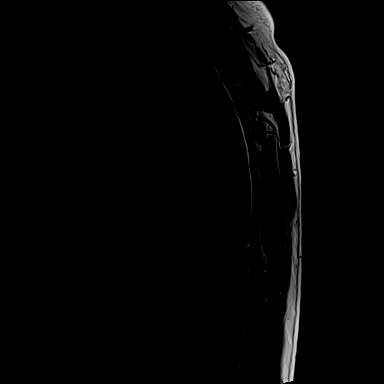
[im 12/16]
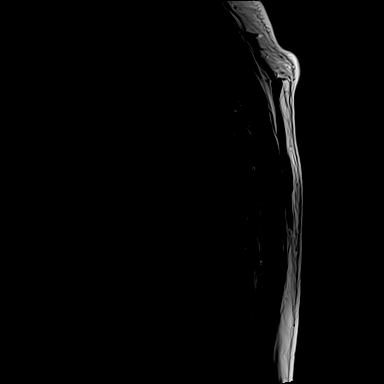
[im 16/16]
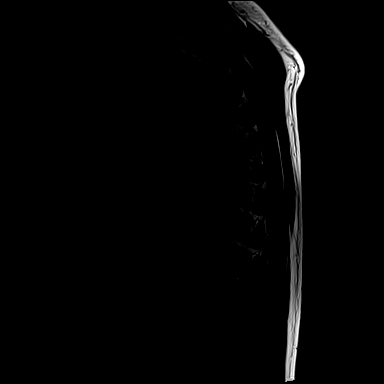

[Series 9: T1 · sagittal · 3.0mm · 0.89mm/px · 5 of 16 slices shown]
[im 1/16]
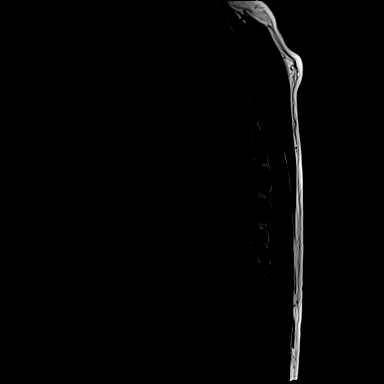
[im 4/16]
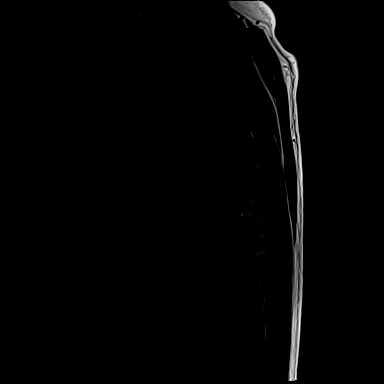
[im 8/16]
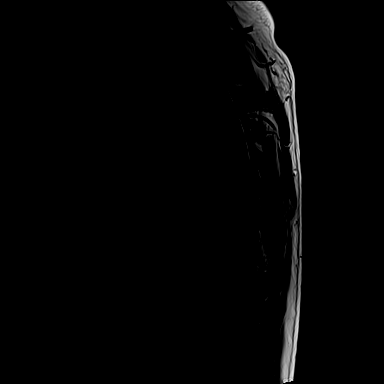
[im 12/16]
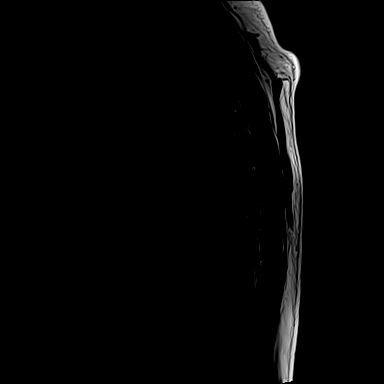
[im 16/16]
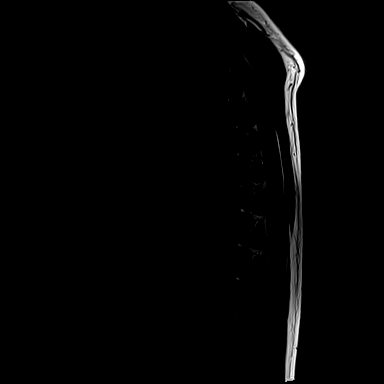

[Series 10: T2 · axial · 4.0mm · 0.28mm/px · z∈[-351,-108]mm · 8 of 36 slices shown (2 of 2)]
[im 1/36]
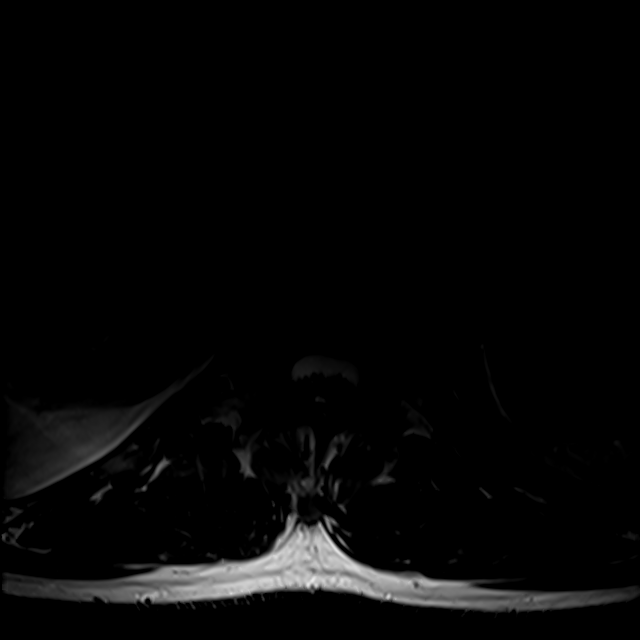
[im 4/36]
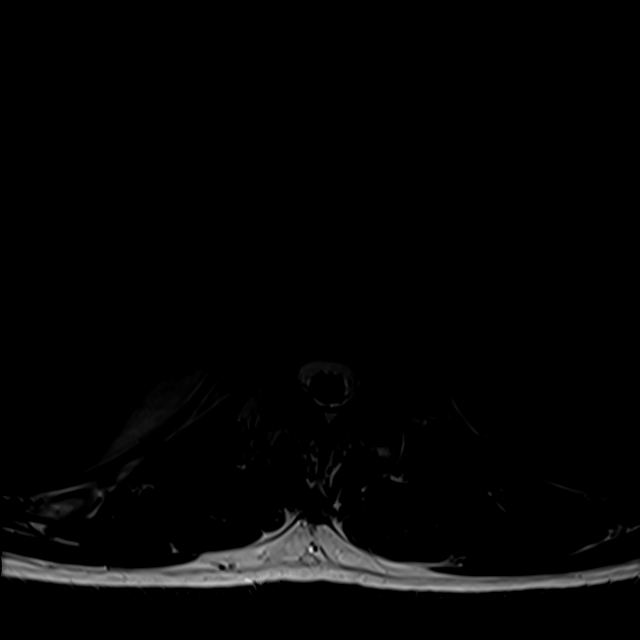
[im 12/36]
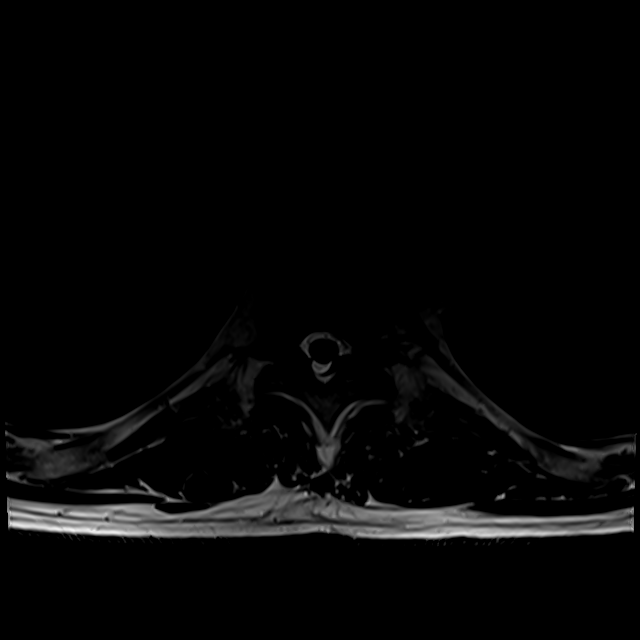
[im 16/36]
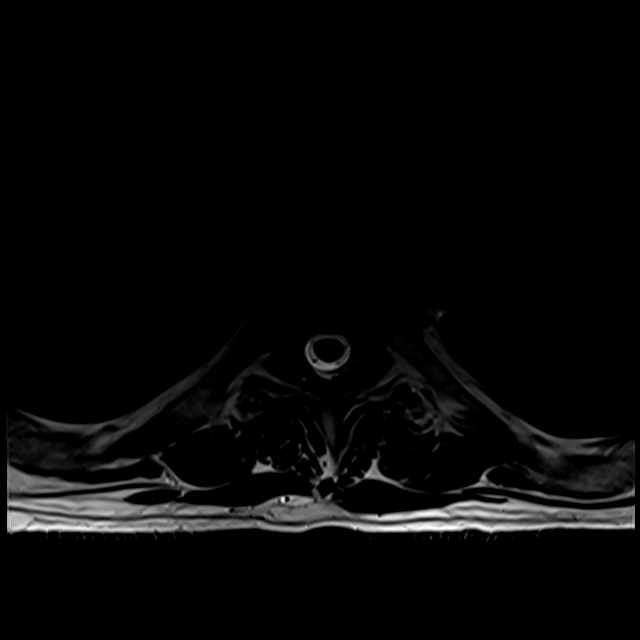
[im 20/36]
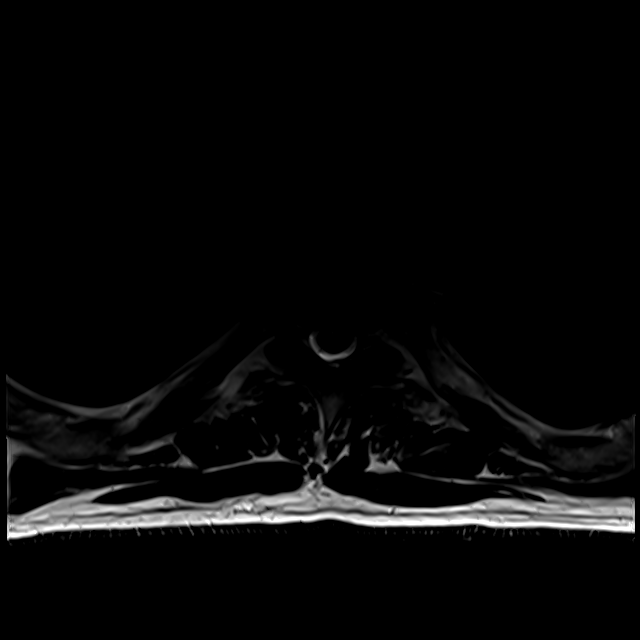
[im 24/36]
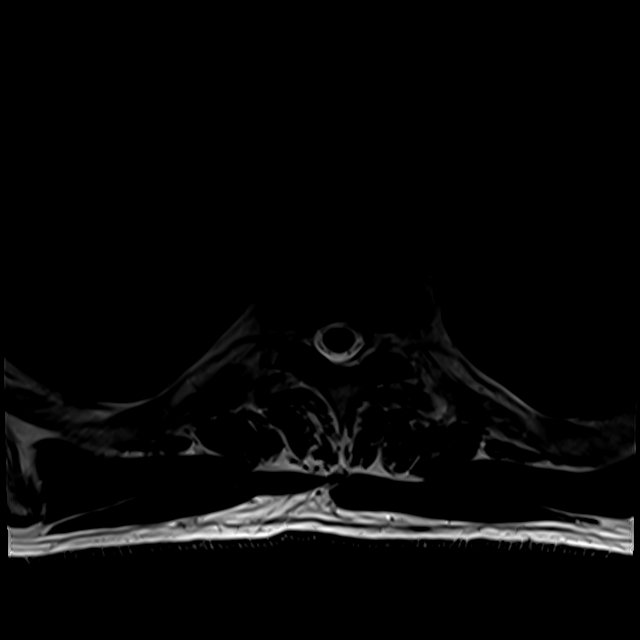
[im 32/36]
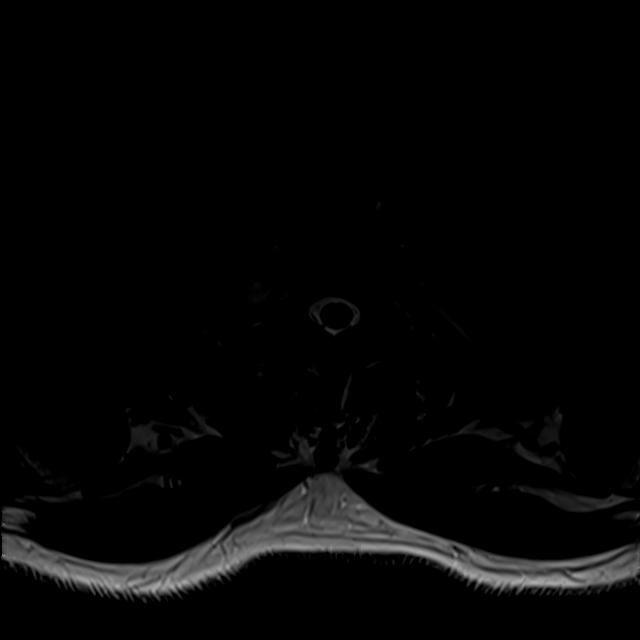
[im 36/36]
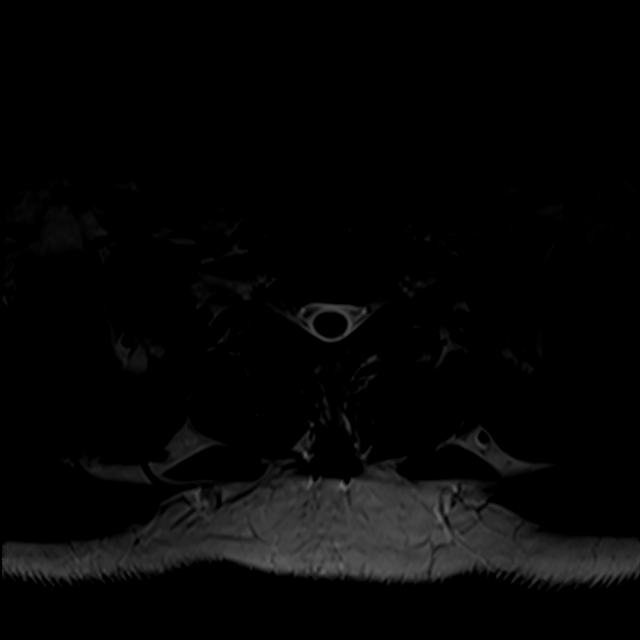

[21 of 48 positions shown; findings below may reference images not displayed]

FINDINGS: MRI CERVICAL SPINE FINDINGS

Alignment: Physiologic.

Vertebrae: No fracture, evidence of discitis, or bone lesion.

Cord: Normal signal and morphology.

Posterior Fossa, vertebral arteries, paraspinal tissues: Negative.

Disc levels:

C1-2: Unremarkable.

C2-3: Normal disc space and facet joints. There is no spinal canal
stenosis. No neural foraminal stenosis.

C3-4: Normal disc space and facet joints. There is no spinal canal
stenosis. No neural foraminal stenosis.

C4-5: Small central disc protrusion narrowing the ventral thecal
sac. There is no spinal canal stenosis. No neural foraminal
stenosis.

C5-6: Small left subarticular disc protrusion and right
uncovertebral hypertrophy. Mild spinal canal stenosis. Moderate
right neural foraminal stenosis.

C6-7: Small disc bulge with bilateral uncovertebral hypertrophy.
There is no spinal canal stenosis. Mild bilateral neural foraminal
stenosis.

C7-T1: Normal disc space and facet joints. There is no spinal canal
stenosis. No neural foraminal stenosis.

MRI THORACIC SPINE FINDINGS

Alignment:  Physiologic.

Vertebrae: No fracture, evidence of discitis, or bone lesion.

Cord:  Normal signal and morphology.

Paraspinal and other soft tissues: Negative.

Disc levels:

T6-7: Small central disc protrusion contacts the ventral surface of
the spinal cord.

T7-8: Small central disc protrusion without stenosis.

T8-9: Small central disc protrusion without stenosis.

The other thoracic levels are unremarkable.
IMPRESSION: 1. Mild cervical spinal canal stenosis and moderate right neural
foraminal stenosis at C5-6.
2. Mild bilateral neural foraminal stenosis at C6-7.
3. Small central disc protrusions at T6-7, T7-8 and T8-9 without
stenosis.

## 2021-04-29 IMAGING — MR MR CERVICAL SPINE W/O CM
4 of 5 series · 19 of 48 positions shown · non-contrast
Comparison: None.

CLINICAL DATA: Mid back pain with left lower extremity weakness.

left leg weakness ad hyperreflexia; Mid-back pain, neuro deficit
left leg weakness and hyper-reflexia.
EXAM:
MRI CERVICAL AND THORACIC SPINE WITHOUT CONTRAST
TECHNIQUE: Multiplanar and multiecho pulse sequences of the cervical spine, to
include the craniocervical junction and cervicothoracic junction,
and the thoracic spine, were obtained without intravenous contrast.

[Series 2: T1 · sagittal · 3.0mm · 0.66mm/px · 3 of 16 slices shown]
[im 3/16]
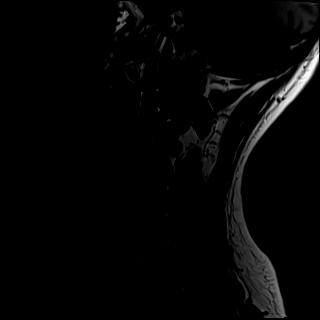
[im 8/16]
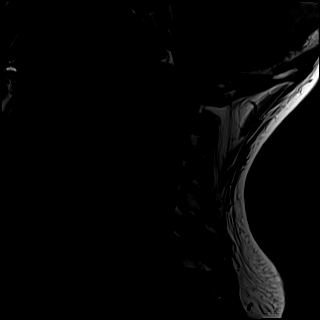
[im 13/16]
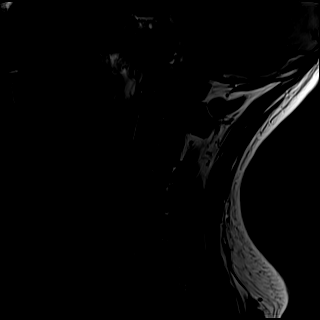

[Series 3: T2 · sagittal · 3.0mm · 0.55mm/px · 7 of 16 slices shown (1 of 2)]
[im 1/16]
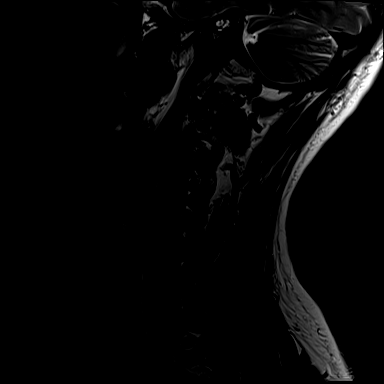
[im 3/16]
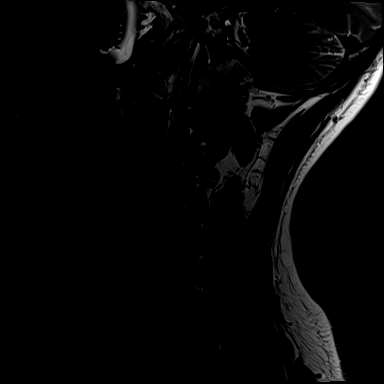
[im 6/16]
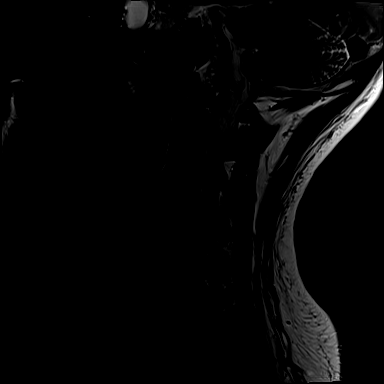
[im 8/16]
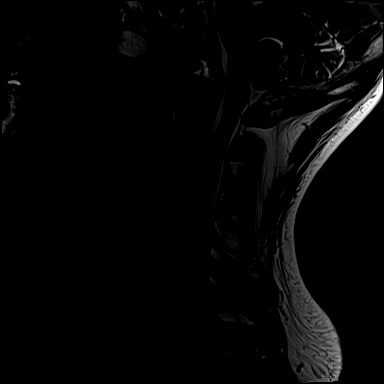
[im 11/16]
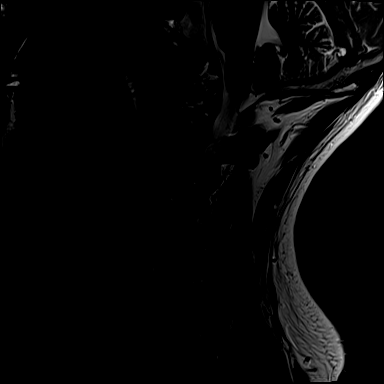
[im 13/16]
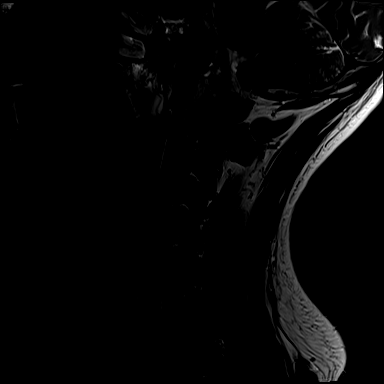
[im 16/16]
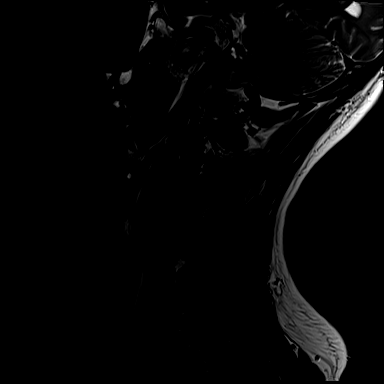

[Series 4: STIR · sagittal · 3.0mm · 0.33mm/px · 3 of 16 slices shown]
[im 4/16]
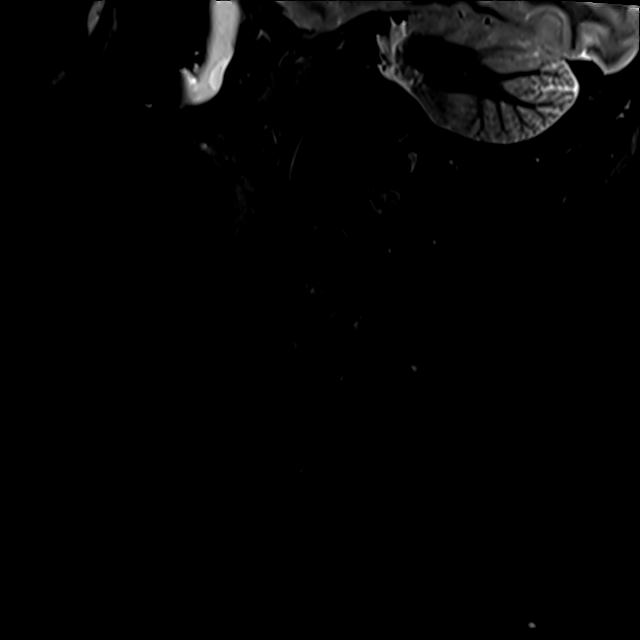
[im 10/16]
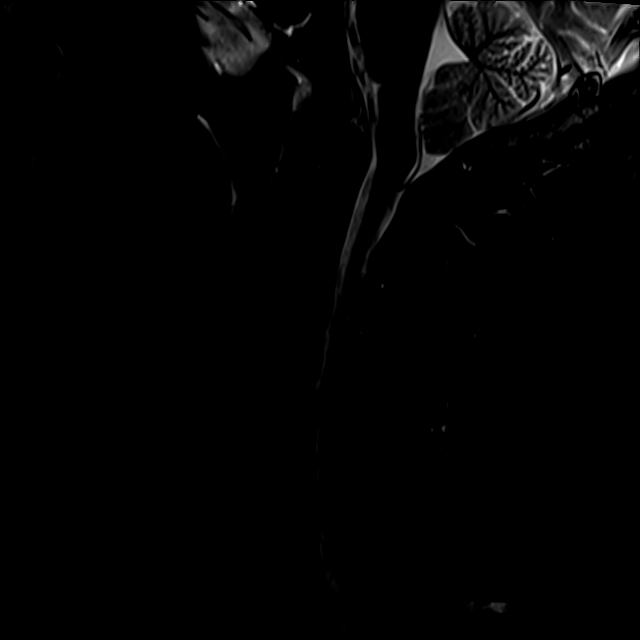
[im 16/16]
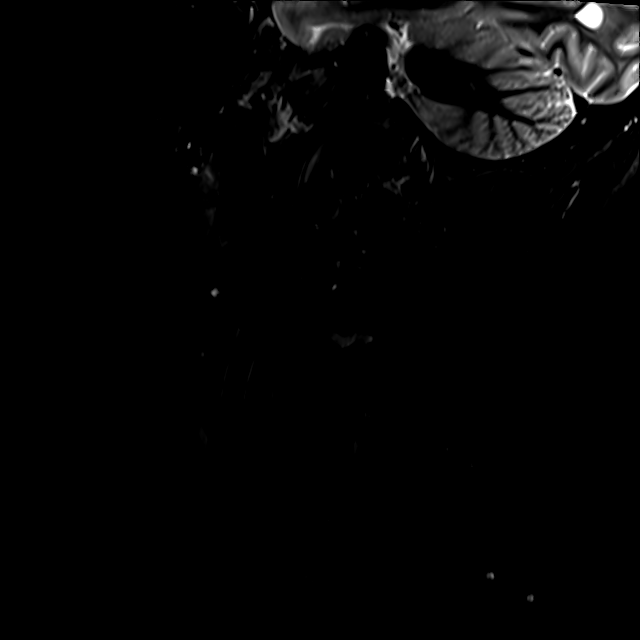

[Series 6: T2 · axial · 3.0mm · 0.28mm/px · z∈[-91,+0]mm · 6 of 36 slices shown (2 of 2)]
[im 1/36]
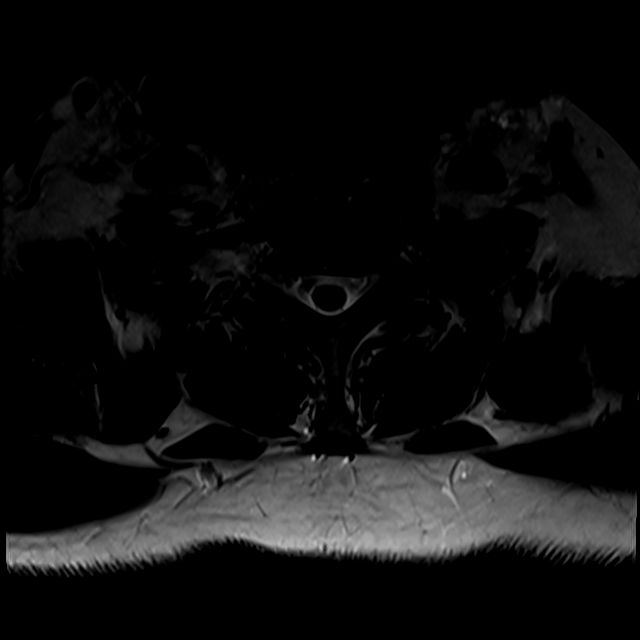
[im 6/36]
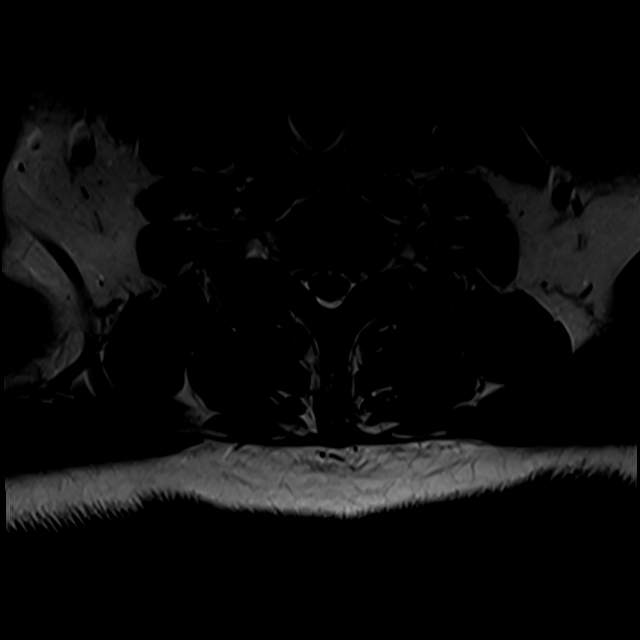
[im 11/36]
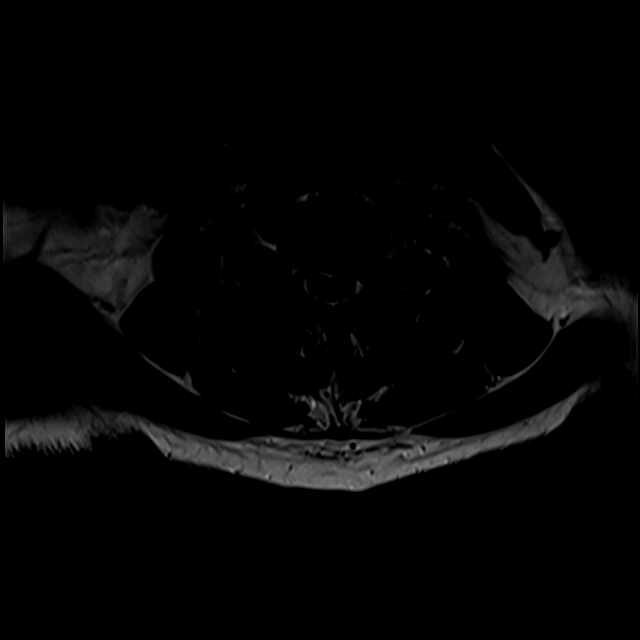
[im 17/36]
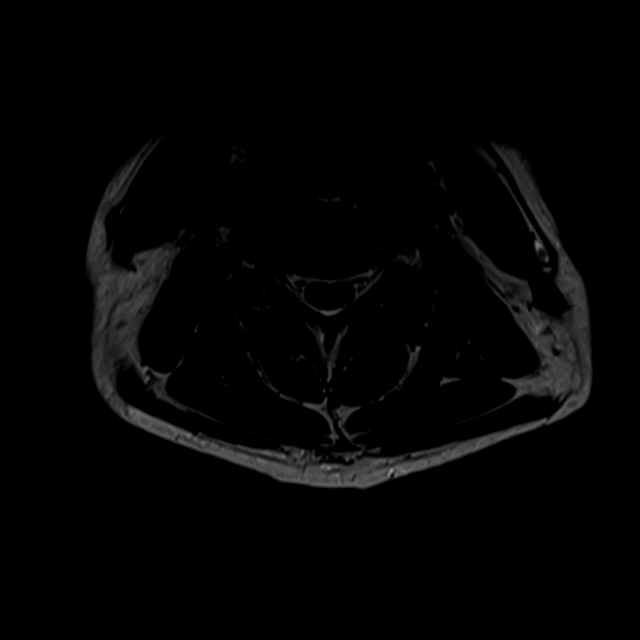
[im 19/36]
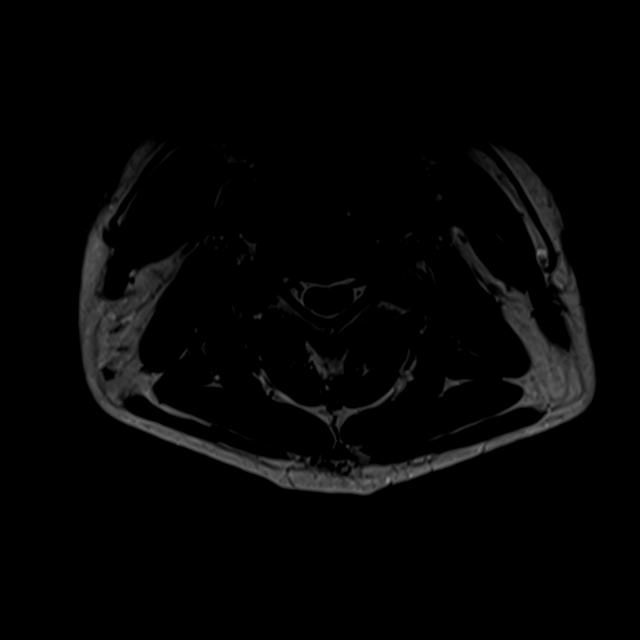
[im 30/36]
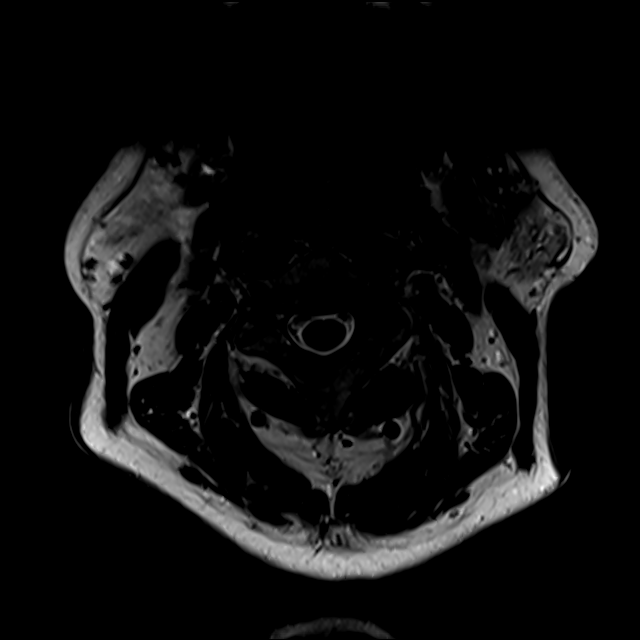

[19 of 48 positions shown; findings below may reference images not displayed]

FINDINGS: MRI CERVICAL SPINE FINDINGS

Alignment: Physiologic.

Vertebrae: No fracture, evidence of discitis, or bone lesion.

Cord: Normal signal and morphology.

Posterior Fossa, vertebral arteries, paraspinal tissues: Negative.

Disc levels:

C1-2: Unremarkable.

C2-3: Normal disc space and facet joints. There is no spinal canal
stenosis. No neural foraminal stenosis.

C3-4: Normal disc space and facet joints. There is no spinal canal
stenosis. No neural foraminal stenosis.

C4-5: Small central disc protrusion narrowing the ventral thecal
sac. There is no spinal canal stenosis. No neural foraminal
stenosis.

C5-6: Small left subarticular disc protrusion and right
uncovertebral hypertrophy. Mild spinal canal stenosis. Moderate
right neural foraminal stenosis.

C6-7: Small disc bulge with bilateral uncovertebral hypertrophy.
There is no spinal canal stenosis. Mild bilateral neural foraminal
stenosis.

C7-T1: Normal disc space and facet joints. There is no spinal canal
stenosis. No neural foraminal stenosis.

MRI THORACIC SPINE FINDINGS

Alignment:  Physiologic.

Vertebrae: No fracture, evidence of discitis, or bone lesion.

Cord:  Normal signal and morphology.

Paraspinal and other soft tissues: Negative.

Disc levels:

T6-7: Small central disc protrusion contacts the ventral surface of
the spinal cord.

T7-8: Small central disc protrusion without stenosis.

T8-9: Small central disc protrusion without stenosis.

The other thoracic levels are unremarkable.
IMPRESSION: 1. Mild cervical spinal canal stenosis and moderate right neural
foraminal stenosis at C5-6.
2. Mild bilateral neural foraminal stenosis at C6-7.
3. Small central disc protrusions at T6-7, T7-8 and T8-9 without
stenosis.

## 2021-05-03 NOTE — Telephone Encounter (Signed)
I spoke to Jerry Richards.  He continues to have progressive weakness in the left leg.  On exam last week he had hyperreflexia. ? ?We checked an urgent MRI of the cervical and thoracic spine.  Although he has some degenerative changes, nothing could explain weakness and hyperreflexia in the left leg. ? ?Because of the increased reflexes I am most concerned about a CNS process.  Therefore, we need to check an MRI of the brain to rule out a tumor or other subacute process.  He has kidney disease and elevated creatinine and we will have to do the study without contrast. ? ?I will also check an NCV/EMG study as the weakness only involves the left leg to assess for radiculopathy, multiple mononeuropathies and lumbosacral plexus disorder. ?

## 2021-05-08 ENCOUNTER — Encounter (HOSPITAL_COMMUNITY): Payer: Self-pay | Admitting: Emergency Medicine

## 2021-05-08 ENCOUNTER — Emergency Department (HOSPITAL_COMMUNITY)
Admission: EM | Admit: 2021-05-08 | Discharge: 2021-05-08 | Disposition: A | Discharge status: Home / Self Care | Payer: BC Managed Care – PPO | Attending: Emergency Medicine | Admitting: Emergency Medicine

## 2021-05-08 ENCOUNTER — Emergency Department (HOSPITAL_COMMUNITY): Payer: BC Managed Care – PPO

## 2021-05-08 ENCOUNTER — Other Ambulatory Visit: Payer: Self-pay

## 2021-05-08 ENCOUNTER — Encounter: Payer: Self-pay | Admitting: Neurology

## 2021-05-08 DIAGNOSIS — I4892 Unspecified atrial flutter: Secondary | ICD-10-CM | POA: Diagnosis not present

## 2021-05-08 DIAGNOSIS — Z7901 Long term (current) use of anticoagulants: Secondary | ICD-10-CM | POA: Diagnosis not present

## 2021-05-08 DIAGNOSIS — G939 Disorder of brain, unspecified: Secondary | ICD-10-CM | POA: Diagnosis not present

## 2021-05-08 DIAGNOSIS — R531 Weakness: Secondary | ICD-10-CM | POA: Diagnosis present

## 2021-05-08 DIAGNOSIS — G9389 Other specified disorders of brain: Secondary | ICD-10-CM

## 2021-05-08 DIAGNOSIS — E039 Hypothyroidism, unspecified: Secondary | ICD-10-CM | POA: Diagnosis not present

## 2021-05-08 DIAGNOSIS — Z20822 Contact with and (suspected) exposure to covid-19: Secondary | ICD-10-CM | POA: Diagnosis not present

## 2021-05-08 LAB — DIFFERENTIAL
Abs Immature Granulocytes: 0.03 10*3/uL (ref 0.00–0.07)
Basophils Absolute: 0 10*3/uL (ref 0.0–0.1)
Basophils Relative: 1 %
Eosinophils Absolute: 0.2 10*3/uL (ref 0.0–0.5)
Eosinophils Relative: 3 %
Immature Granulocytes: 0 %
Lymphocytes Relative: 22 %
Lymphs Abs: 1.7 10*3/uL (ref 0.7–4.0)
Monocytes Absolute: 0.5 10*3/uL (ref 0.1–1.0)
Monocytes Relative: 7 %
Neutro Abs: 5.4 10*3/uL (ref 1.7–7.7)
Neutrophils Relative %: 67 %

## 2021-05-08 LAB — CBC
HCT: 51.5 % (ref 39.0–52.0)
Hemoglobin: 17.5 g/dL — ABNORMAL HIGH (ref 13.0–17.0)
MCH: 31 pg (ref 26.0–34.0)
MCHC: 34 g/dL (ref 30.0–36.0)
MCV: 91.3 fL (ref 80.0–100.0)
Platelets: 208 10*3/uL (ref 150–400)
RBC: 5.64 MIL/uL (ref 4.22–5.81)
RDW: 13.7 % (ref 11.5–15.5)
WBC: 7.9 10*3/uL (ref 4.0–10.5)
nRBC: 0 % (ref 0.0–0.2)

## 2021-05-08 LAB — I-STAT CHEM 8, ED
BUN: 26 mg/dL — ABNORMAL HIGH (ref 6–20)
Calcium, Ion: 1.2 mmol/L (ref 1.15–1.40)
Chloride: 103 mmol/L (ref 98–111)
Creatinine, Ser: 1.6 mg/dL — ABNORMAL HIGH (ref 0.61–1.24)
Glucose, Bld: 92 mg/dL (ref 70–99)
HCT: 48 % (ref 39.0–52.0)
Hemoglobin: 16.3 g/dL (ref 13.0–17.0)
Potassium: 4.1 mmol/L (ref 3.5–5.1)
Sodium: 140 mmol/L (ref 135–145)
TCO2: 28 mmol/L (ref 22–32)

## 2021-05-08 LAB — RESP PANEL BY RT-PCR (FLU A&B, COVID) ARPGX2
Influenza A by PCR: NEGATIVE
Influenza B by PCR: NEGATIVE
SARS Coronavirus 2 by RT PCR: NEGATIVE

## 2021-05-08 LAB — COMPREHENSIVE METABOLIC PANEL
ALT: 24 U/L (ref 0–44)
AST: 15 U/L (ref 15–41)
Albumin: 4.4 g/dL (ref 3.5–5.0)
Alkaline Phosphatase: 48 U/L (ref 38–126)
Anion gap: 9 (ref 5–15)
BUN: 25 mg/dL — ABNORMAL HIGH (ref 6–20)
CO2: 26 mmol/L (ref 22–32)
Calcium: 9.3 mg/dL (ref 8.9–10.3)
Chloride: 104 mmol/L (ref 98–111)
Creatinine, Ser: 1.48 mg/dL — ABNORMAL HIGH (ref 0.61–1.24)
GFR, Estimated: 57 mL/min — ABNORMAL LOW (ref >60)
Glucose, Bld: 94 mg/dL (ref 70–99)
Potassium: 3.9 mmol/L (ref 3.5–5.1)
Sodium: 139 mmol/L (ref 135–145)
Total Bilirubin: 0.7 mg/dL (ref 0.3–1.2)
Total Protein: 7.6 g/dL (ref 6.5–8.1)

## 2021-05-08 LAB — APTT: aPTT: 29 seconds (ref 24–36)

## 2021-05-08 LAB — PROTIME-INR
INR: 1.1 (ref 0.8–1.2)
Prothrombin Time: 14 seconds (ref 11.4–15.2)

## 2021-05-08 LAB — ETHANOL: Alcohol, Ethyl (B): <10 mg/dL (ref <10)

## 2021-05-08 IMAGING — CT CT HEAD W/O CM
4 series · 16 of 47 positions shown, 18 images · non-contrast
Comparison: None

CLINICAL DATA: New onset LEFT facial and arm numbness, history
atrial fibrillation, smoker



[Series 2: head w o · axial · 0.44mm/px · z∈[+38,+158]mm · 7 of 33 slices shown, 9 images]
[im 5/33  brain]
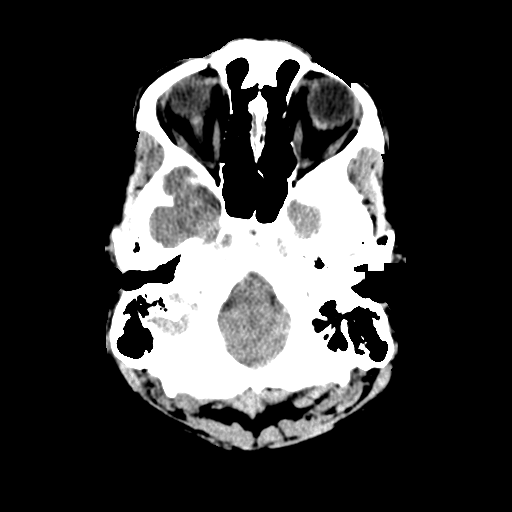
[im 5/33  bone]
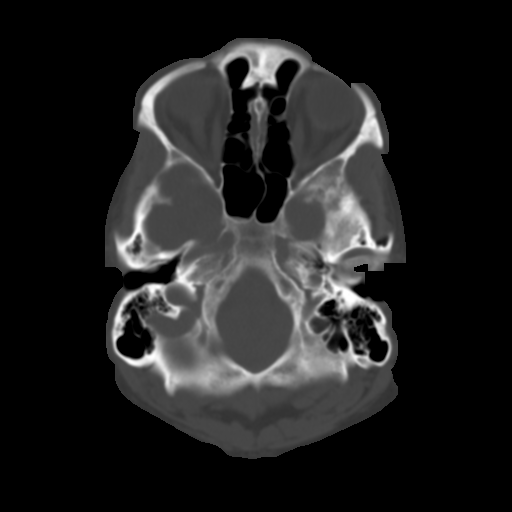
[im 9/33  brain]
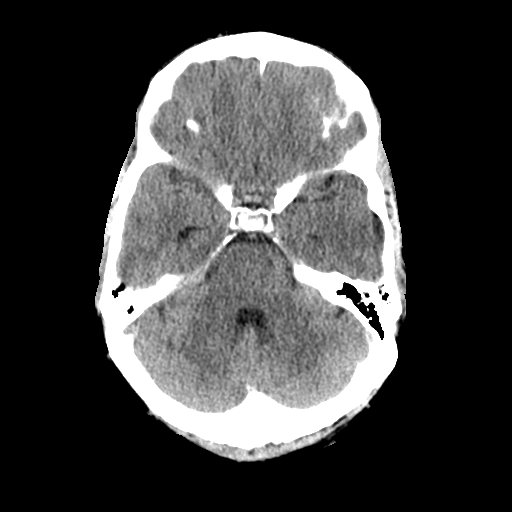
[im 13/33  brain]
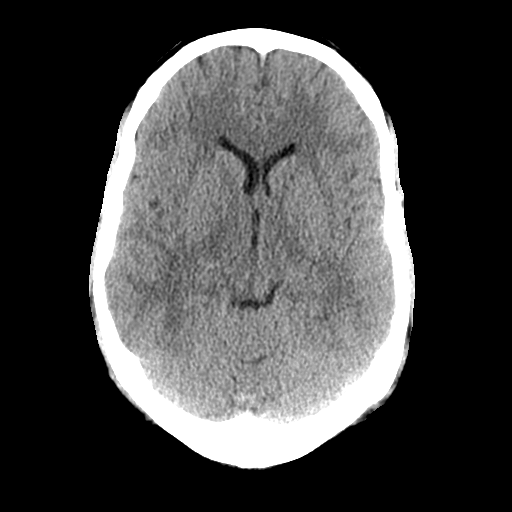
[im 17/33  brain]
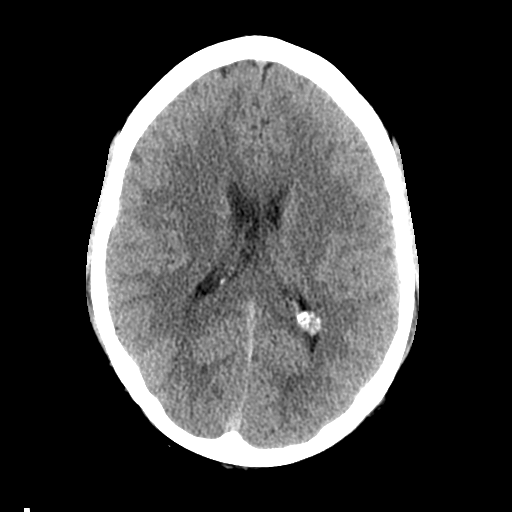
[im 21/33  brain]
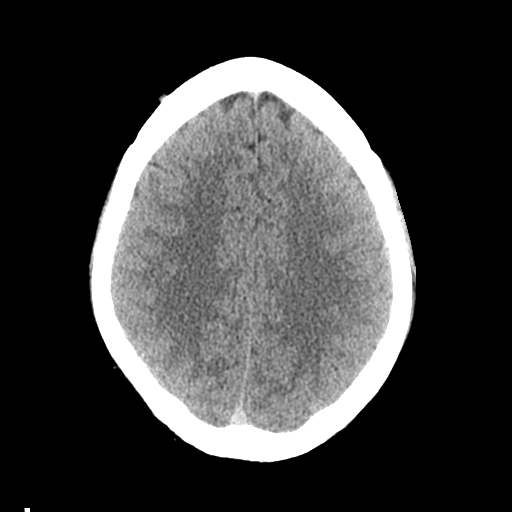
[im 21/33  bone]
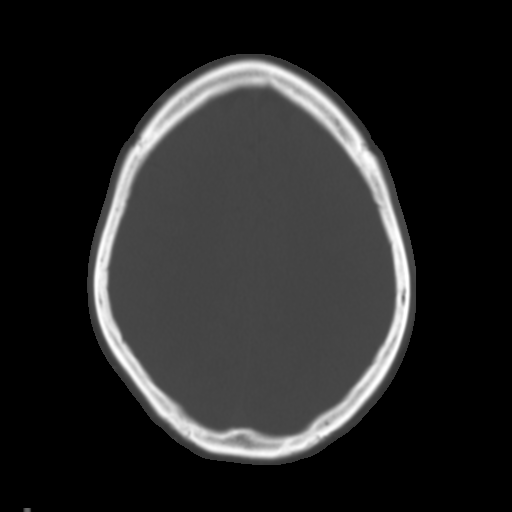
[im 25/33  brain]
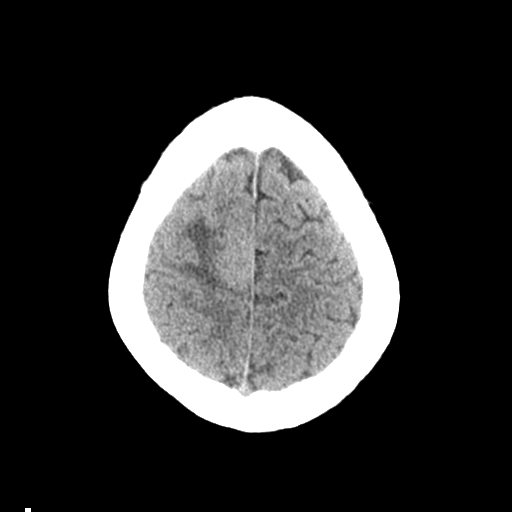
[im 29/33  brain]
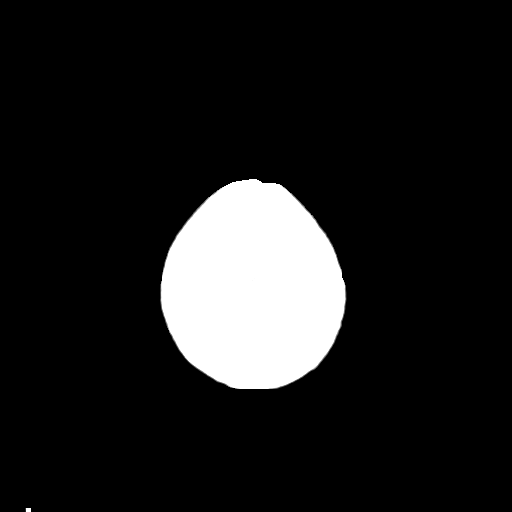

[Series 3: head bone · axial · 0.44mm/px · z∈[+34,+66]mm · 3 of 81 slices shown]
[im 9/81  bone]
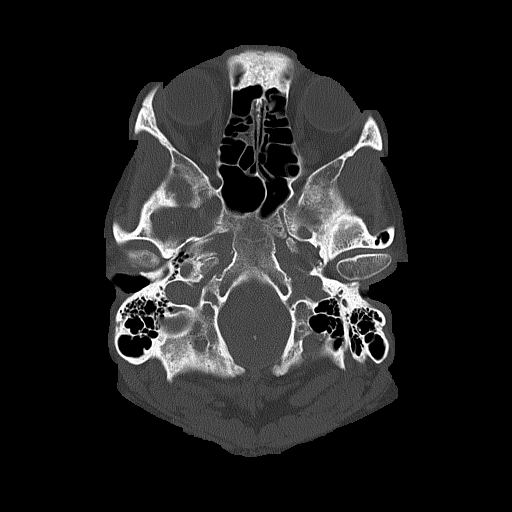
[im 17/81  bone]
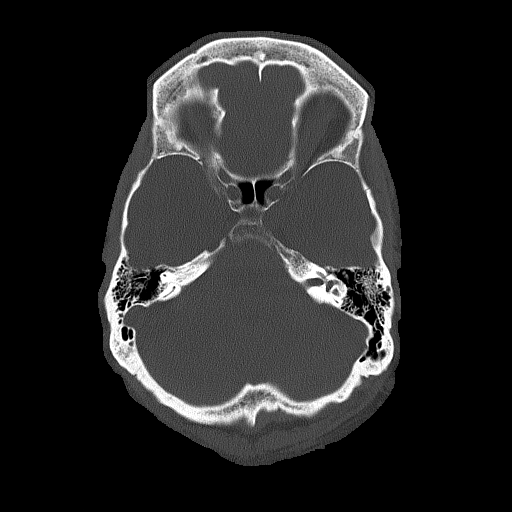
[im 25/81  bone]
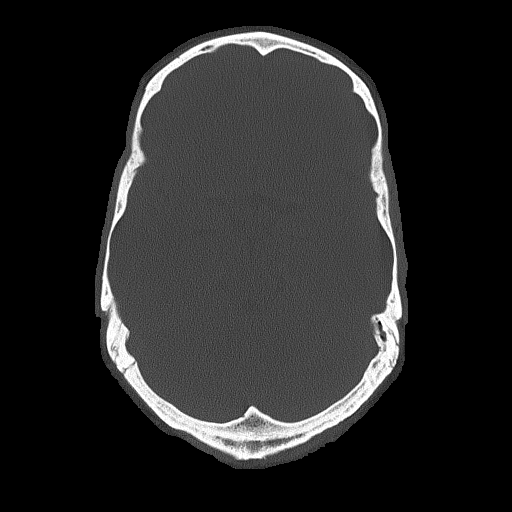

[Series 4: coronal soft · coronal · 0.36mm/px · 3 of 84 slices shown]
[im 28/84  brain]
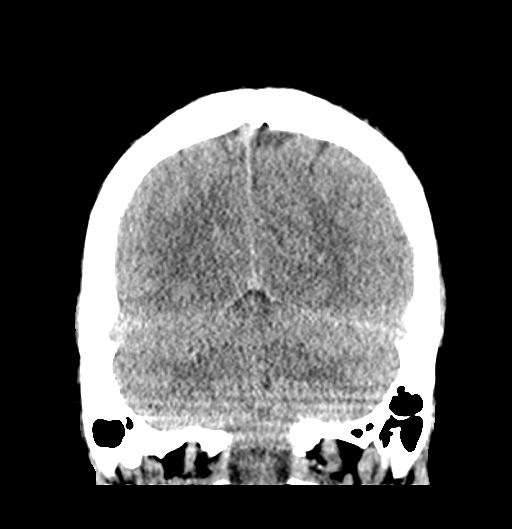
[im 37/84  brain]
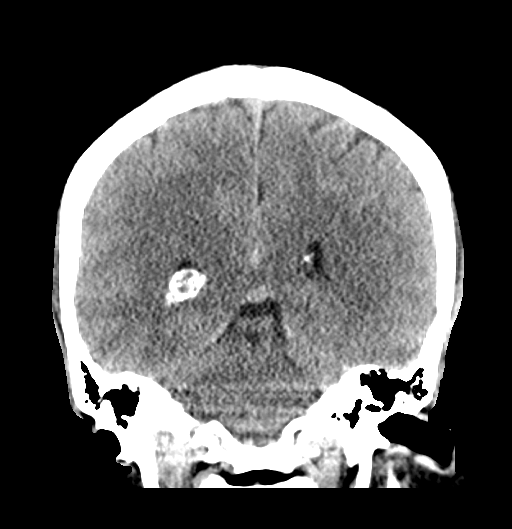
[im 47/84  brain]
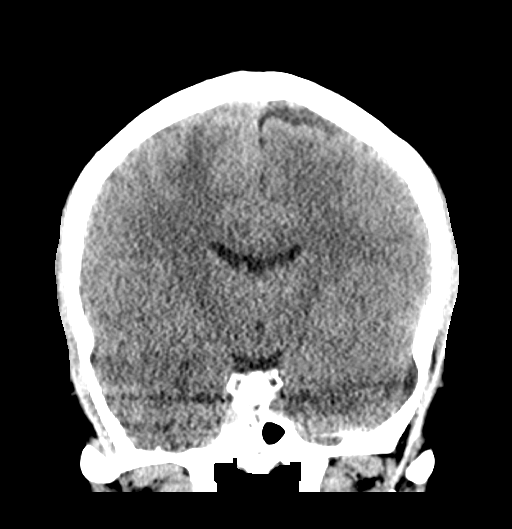

[Series 5: sagittal soft · sagittal · 0.34mm/px · 3 of 64 slices shown]
[im 22/64  brain]
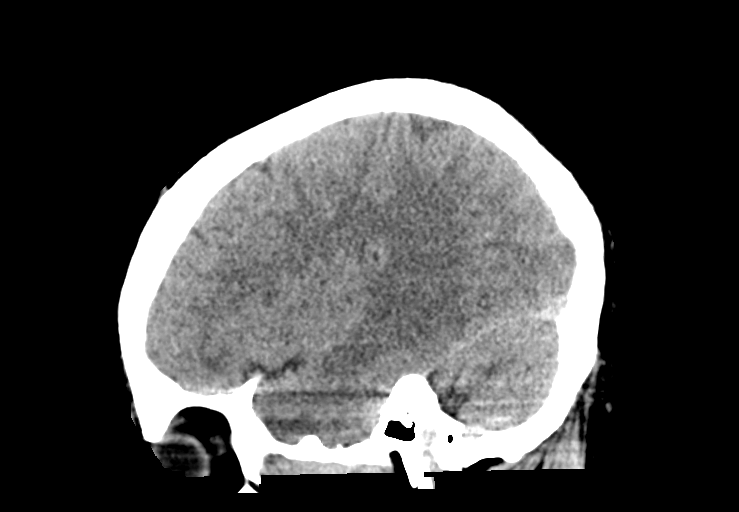
[im 32/64  brain]
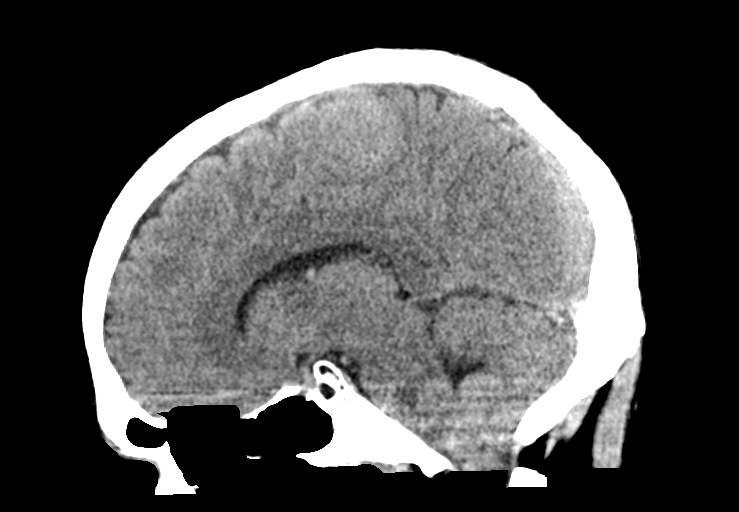
[im 43/64  brain]
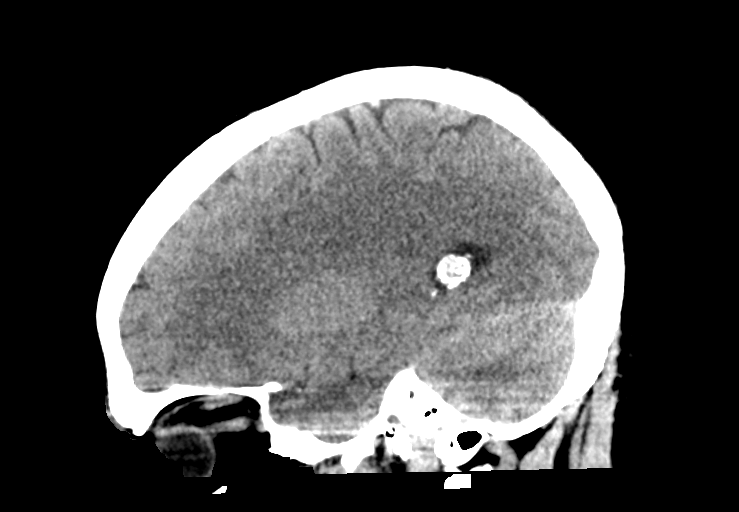

[16 of 47 positions shown; findings below may reference images not displayed]

FINDINGS: Brain: Mild asymmetry of the lateral ventricles larger on RIGHT than
LEFT. Minimally hyperdense paramedian mass at RIGHT vertex, 2.3 x
1.6 x 2.4 cm, abutting the falx, associated with mild adjacent
vasogenic edema within the high RIGHT frontal lobe. 2 mm of RIGHT to
LEFT midline shift. No additional mass, hemorrhage, or infarction.
No extra-axial fluid collections.

Vascular: No hyperdense vessels

Skull: Intact

Sinuses/Orbits: Clear

Other: N/A
IMPRESSION: Minimally hyperdense paramedian mass at the RIGHT vertex, abutting
the falx, 2.3 x 1.6 x 2.4 cm, associated with mild adjacent
vasogenic edema within the high RIGHT frontal lobe.

This could represent a para falcine meningioma or primary versus
metastatic intra-axial mass at the RIGHT vertex.

Follow-up characterization by MR imaging with and without contrast
recommended.

Minimal RIGHT to LEFT midline shift.

No additional intracranial abnormalities.

Findings discussed with Dr. GILVERTO on [DATE] at [AW] hours.

## 2021-05-08 MED ORDER — DEXAMETHASONE 4 MG PO TABS: 4.0000 mg | ORAL_TABLET | Freq: Two times a day (BID) | ORAL | 0 refills | Status: DC

## 2021-05-08 MED ORDER — DEXAMETHASONE SODIUM PHOSPHATE 10 MG/ML IJ SOLN
10.0000 mg | Freq: Once | INTRAMUSCULAR | Status: AC
  Administered 2021-05-08: 10 mg via INTRAVENOUS
  Filled 2021-05-08: qty 1

## 2021-05-08 NOTE — ED Notes (Signed)
Patient transported to CT 

## 2021-05-08 NOTE — Discharge Instructions (Addendum)
You were seen in the emergency department for progressive left-sided symptoms.  You had a CAT scan that showed a mass in the brain that is likely the cause of your symptoms.  We are trying to set you up with an outpatient MRI of your brain.  Please schedule appointment with Dr. Annette Stable.  He wants you to start a steroid Decadron twice a day.  Return to the emergency department if any worsening or concerning symptoms ?

## 2021-05-08 NOTE — ED Provider Notes (Signed)
[facility] EMERGENCY DEPARTMENT Provider Note   CSN: [id number] Arrival date & time: 05/08/21  2015     History  Chief Complaint  Patient presents with   Numbness    Jerry Richards is a 51 y.o. male.  He has a history of a flutter on Xarelto, hypothyroidism cardiomyopathy.  Few months ago he started with some left leg drop.  This progressed over time to some weakness at his knee and some intermittent spasms of his left leg and then left side of body.  He saw neurology and had an MRI of his thoracic and cervical spine.  He was post to be getting an outpatient MRI brain but was awaiting approval.  Since yesterday he has noticed some more clumsiness in his left arm and feeling subjectively weak and some numbness of his left arm and face.  No blurry vision double vision.  The history is provided by the patient and the spouse.  Cerebrovascular Accident This is a new problem. The current episode started yesterday. The problem occurs constantly. The problem has not changed since onset.Pertinent negatives include no chest pain, no abdominal pain, no headaches and no shortness of breath. Nothing aggravates the symptoms. Nothing relieves the symptoms. He has tried rest for the symptoms. The treatment provided no relief.      Home Medications Prior to Admission medications   Medication Sig Start Date End Date Taking? Authorizing Provider  baclofen (LIORESAL) 10 MG tablet Take 1 tablet (10 mg total) by mouth 3 (three) times daily. 04/28/21   Sater, Nanine Means, MD  losartan (COZAAR) 50 MG tablet Take 50 mg by mouth daily. 12/09/15   [provider]  rivaroxaban (XARELTO) 20 MG TABS tablet Take 1 tablet (20 mg total) by mouth daily. 03/10/21         Allergies    Amiodarone and Morphine and related    Review of Systems   Review of Systems  Constitutional:  Negative for fever.  HENT:  Negative for sore throat.   Eyes:  Negative for visual disturbance.  Respiratory:  Negative for  shortness of breath.   Cardiovascular:  Negative for chest pain.  Gastrointestinal:  Negative for abdominal pain.  Genitourinary:  Negative for dysuria.  Musculoskeletal:  Positive for gait problem. Negative for neck pain.  Skin:  Negative for rash.  Neurological:  Positive for weakness and numbness. Negative for speech difficulty and headaches.   Physical Exam Updated Vital Signs BP (!) 145/85 (BP Location: Right Arm)    Pulse 66    Temp 98 F (36.7 C)    Resp 19    Ht 6' (1.829 m)    Wt 97.3 kg    SpO2 100%    BMI 29.09 kg/m  Physical Exam Vitals and nursing note reviewed.  Constitutional:      General: He is not in acute distress.    Appearance: He is well-developed.  HENT:     Head: Normocephalic and atraumatic.  Eyes:     Conjunctiva/sclera: Conjunctivae normal.  Cardiovascular:     Rate and Rhythm: Normal rate and regular rhythm.     Heart sounds: No murmur heard. Pulmonary:     Effort: Pulmonary effort is normal. No respiratory distress.     Breath sounds: Normal breath sounds.  Abdominal:     Palpations: Abdomen is soft.     Tenderness: There is no abdominal tenderness.  Musculoskeletal:        General: No swelling. Normal range of motion.  Cervical back: Neck supple.  Skin:    General: Skin is warm and dry.     Capillary Refill: Capillary refill takes less than 2 seconds.  Neurological:     Mental Status: He is alert.     Cranial Nerves: No cranial nerve deficit.     Sensory: Sensory deficit present.     Motor: Weakness present.     Comments: He has no plantar or dorsiflexion of his left foot.  He has decreased strength of his left hip abductor.  Normal facial sensation and no obvious cranial nerve deficit.  No pronator drift.  Subjective decrease sensation of left arm.  Normal strength of left and right arm.  Psychiatric:        Mood and Affect: Mood normal.    ED Results / Procedures / Treatments   Labs (all labs ordered are listed, but only abnormal  results are displayed) Labs Reviewed  CBC - Abnormal; Notable for the following components:      Result Value   Hemoglobin 17.5 (*)    All other components within normal limits  COMPREHENSIVE METABOLIC PANEL - Abnormal; Notable for the following components:   BUN 25 (*)    Creatinine, Ser 1.48 (*)    GFR, Estimated 57 (*)    All other components within normal limits  I-STAT CHEM 8, ED - Abnormal; Notable for the following components:   BUN 26 (*)    Creatinine, Ser 1.60 (*)    All other components within normal limits  RESP PANEL BY RT-PCR (FLU A&B, COVID) ARPGX2  ETHANOL  PROTIME-INR  APTT  DIFFERENTIAL    EKG EKG Interpretation  Date/Time:  Sunday May 08 2021 21:29:23 EDT Ventricular Rate:  55 PR Interval:  184 QRS Duration: 108 QT Interval:  427 QTC Calculation: 409 R Axis:   -59 Text Interpretation: Sinus rhythm LAD, consider left anterior fascicular block RSR' in V1 or V2, right VCD or RVH No significant change since prior 10/17 Confirmed by Aletta Edouard [phone]) on 05/08/2021 9:49:55 PM  Radiology CT HEAD WO CONTRAST  Result Date: 05/08/2021 CLINICAL DATA:  New onset LEFT facial and arm numbness, history atrial fibrillation, smoker EXAM: CT HEAD WITHOUT CONTRAST TECHNIQUE: Contiguous axial images were obtained from the base of the skull through the vertex without intravenous contrast. RADIATION DOSE REDUCTION: This exam was performed according to the departmental dose-optimization program which includes automated exposure control, adjustment of the mA and/or kV according to patient size and/or use of iterative reconstruction technique. COMPARISON:  None FINDINGS: Brain: Mild asymmetry of the lateral ventricles larger on RIGHT than LEFT. Minimally hyperdense paramedian mass at RIGHT vertex, 2.3 x 1.6 x 2.4 cm, abutting the falx, associated with mild adjacent vasogenic edema within the high RIGHT frontal lobe. 2 mm of RIGHT to LEFT midline shift. No additional mass,  hemorrhage, or infarction. No extra-axial fluid collections. Vascular: No hyperdense vessels Skull: Intact Sinuses/Orbits: Clear Other: N/A IMPRESSION: Minimally hyperdense paramedian mass at the RIGHT vertex, abutting the falx, 2.3 x 1.6 x 2.4 cm, associated with mild adjacent vasogenic edema within the high RIGHT frontal lobe. This could represent a para falcine meningioma or primary versus metastatic intra-axial mass at the RIGHT vertex. Follow-up characterization by MR imaging with and without contrast recommended. Minimal RIGHT to LEFT midline shift. No additional intracranial abnormalities. Findings discussed with Dr. Melina Copa on 05/08/2021 at 2131 hours. Electronically Signed   By: Lavonia Dana M.D.   On: 05/08/2021 21:32    Procedures Procedures  Medications Ordered in ED Medications  dexamethasone (DECADRON) injection 10 mg (10 mg Intravenous Given 05/08/21 2244)    ED Course/ Medical Decision Making/ A&P Clinical Course as of 05/09/21 1058  Sun May 08, 2021  2231 Discussed with neurosurgery Dr. Trenton Gammon.  He reviewed the CT.  He is recommending urgent MRI as an outpatient started on Decadron 10 mg now and 4 mg twice daily.  He wants to see him in the office this week. [MB]    Clinical Course User Index [MB] Hayden Rasmussen, MD                           Medical Decision Making Amount and/or Complexity of Data Reviewed Labs: ordered. Radiology: ordered.  Risk Prescription drug management.  This patient complains of continued left leg weakness, new right arm weakness clumsiness and facial numbness; this involves an extensive number of treatment Options and is a complaint that carries with it a high risk of complications and morbidity. The differential includes stroke, bleed, mass, spinal lesion, peripheral nerve, metabolic derangement  I ordered, reviewed and interpreted labs, which included CBC with normal white count normal hemoglobin, chemistries normal other than elevated  creatinine stable from priors, COVID and flu negative, INR normal I ordered medication IV Decadron and reviewed PMP when indicated. I ordered imaging studies which included CT head and I independently    visualized and interpreted imaging which showed right head mass Additional history obtained from patient's wife Previous records obtained and reviewed in epic including prior neurology notes I consulted Dr. Annette Stable neurosurgery and discussed lab and imaging findings and discussed disposition.  Cardiac monitoring reviewed, normal sinus rhythm Social determinants considered, ongoing tobacco use Critical Interventions: None  After the interventions stated above, I reevaluated the patient and found patient still to be symptomatic although not rapidly progressing. Admission and further testing considered, patient is appropriate for discharge with plan for outpatient MRI and close neurosurgical follow-up.  Return instructions discussed          Final Clinical Impression(s) / ED Diagnoses Final diagnoses:  Brain mass    Rx / DC Orders ED Discharge Orders          Ordered    MR BRAIN W WO CONTRAST        05/08/21 2234    dexamethasone (DECADRON) 4 MG tablet  2 times daily with meals        05/08/21 2243              Hayden Rasmussen, MD 05/09/21 1109

## 2021-05-08 NOTE — ED Triage Notes (Addendum)
Pt to the ED with complaints of new onset left side facial and arm numbness. ? ?Pt has been seeing a neurologist for the leg numbness. ? ?Pt cleared by Dr. Melina Copa 2023. ? ?

## 2021-05-09 ENCOUNTER — Emergency Department (HOSPITAL_COMMUNITY): Payer: BC Managed Care – PPO

## 2021-05-09 ENCOUNTER — Other Ambulatory Visit: Payer: Self-pay

## 2021-05-09 ENCOUNTER — Encounter (HOSPITAL_COMMUNITY): Payer: Self-pay

## 2021-05-09 ENCOUNTER — Emergency Department (HOSPITAL_COMMUNITY): Admission: RE | Admit: 2021-05-09 | Payer: BC Managed Care – PPO | Source: Ambulatory Visit

## 2021-05-09 ENCOUNTER — Emergency Department (HOSPITAL_COMMUNITY)
Admission: EM | Admit: 2021-05-09 | Discharge: 2021-05-09 | Disposition: A | Discharge status: Home / Self Care | Payer: BC Managed Care – PPO | Attending: Emergency Medicine | Admitting: Emergency Medicine

## 2021-05-09 DIAGNOSIS — G9389 Other specified disorders of brain: Secondary | ICD-10-CM

## 2021-05-09 DIAGNOSIS — D72829 Elevated white blood cell count, unspecified: Secondary | ICD-10-CM | POA: Diagnosis not present

## 2021-05-09 DIAGNOSIS — G930 Cerebral cysts: Secondary | ICD-10-CM | POA: Insufficient documentation

## 2021-05-09 DIAGNOSIS — R202 Paresthesia of skin: Secondary | ICD-10-CM | POA: Diagnosis present

## 2021-05-09 DIAGNOSIS — Z7901 Long term (current) use of anticoagulants: Secondary | ICD-10-CM | POA: Insufficient documentation

## 2021-05-09 DIAGNOSIS — I2510 Atherosclerotic heart disease of native coronary artery without angina pectoris: Secondary | ICD-10-CM | POA: Insufficient documentation

## 2021-05-09 LAB — CBC WITH DIFFERENTIAL/PLATELET
Abs Immature Granulocytes: 0.08 10*3/uL — ABNORMAL HIGH (ref 0.00–0.07)
Basophils Absolute: 0 10*3/uL (ref 0.0–0.1)
Basophils Relative: 0 %
Eosinophils Absolute: 0 10*3/uL (ref 0.0–0.5)
Eosinophils Relative: 0 %
HCT: 48.1 % (ref 39.0–52.0)
Hemoglobin: 16.9 g/dL (ref 13.0–17.0)
Immature Granulocytes: 1 %
Lymphocytes Relative: 5 %
Lymphs Abs: 0.8 10*3/uL (ref 0.7–4.0)
MCH: 31.5 pg (ref 26.0–34.0)
MCHC: 35.1 g/dL (ref 30.0–36.0)
MCV: 89.6 fL (ref 80.0–100.0)
Monocytes Absolute: 0.4 10*3/uL (ref 0.1–1.0)
Monocytes Relative: 2 %
Neutro Abs: 15.8 10*3/uL — ABNORMAL HIGH (ref 1.7–7.7)
Neutrophils Relative %: 92 %
Platelets: 230 10*3/uL (ref 150–400)
RBC: 5.37 MIL/uL (ref 4.22–5.81)
RDW: 13.7 % (ref 11.5–15.5)
WBC: 17.2 10*3/uL — ABNORMAL HIGH (ref 4.0–10.5)
nRBC: 0 % (ref 0.0–0.2)

## 2021-05-09 LAB — BASIC METABOLIC PANEL
Anion gap: 11 (ref 5–15)
BUN: 28 mg/dL — ABNORMAL HIGH (ref 6–20)
CO2: 22 mmol/L (ref 22–32)
Calcium: 9.3 mg/dL (ref 8.9–10.3)
Chloride: 104 mmol/L (ref 98–111)
Creatinine, Ser: 1.32 mg/dL — ABNORMAL HIGH (ref 0.61–1.24)
GFR, Estimated: >60 mL/min (ref >60)
Glucose, Bld: 116 mg/dL — ABNORMAL HIGH (ref 70–99)
Potassium: 4.2 mmol/L (ref 3.5–5.1)
Sodium: 137 mmol/L (ref 135–145)

## 2021-05-09 IMAGING — MR MR HEAD WO/W CM
13 of 16 series · 26 of 48 positions shown · IV contrast (10 ml Gadavist)
Comparison: Head CT yesterday.

CLINICAL DATA: Headache, new or worsening.  Weakness.

EXAM:
MRI HEAD WITHOUT AND WITH CONTRAST
MRA HEAD WITHOUT CONTRAST
TECHNIQUE: Multiplanar, multi-echo pulse sequences of the brain and surrounding
structures were acquired without and with intravenous contrast.
Angiographic images of the Circle of Willis were acquired using MRA
technique without intravenous contrast.
CONTRAST:  10mL GADAVIST GADOBUTROL 1 MMOL/ML IV SOLN

[Series 5: DWI · axial · 4.0mm · 0.88mm/px · z∈[-61,+87]mm · 3 of 38 slices shown (1 of 6)]
[im 1/38]
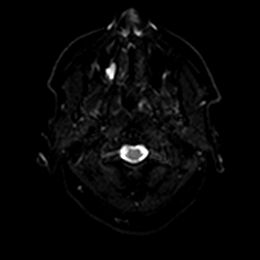
[im 19/38]
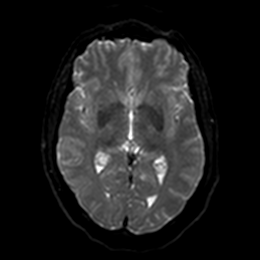
[im 38/38]
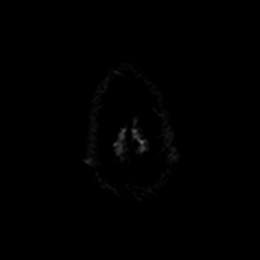

[Series 5: DWI · axial · 4.0mm · 0.88mm/px · z∈[-61,+87]mm · 3 of 38 slices shown (2 of 6)]
[im 1/38]
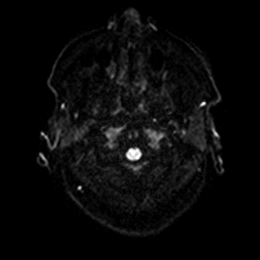
[im 19/38]
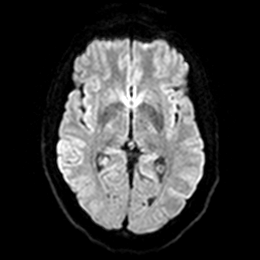
[im 38/38]
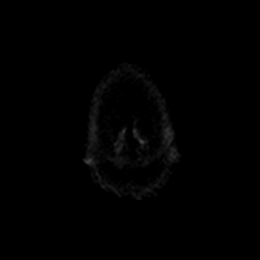

[Series 6: DWI · axial · 4.0mm · 0.88mm/px · z∈[-61,+87]mm · 3 of 36 slices shown (3 of 6)]
[im 1/36]
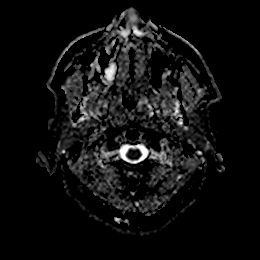
[im 18/36]
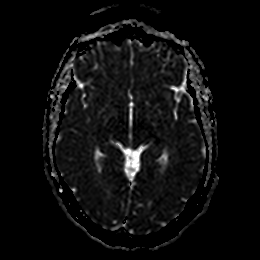
[im 36/36]
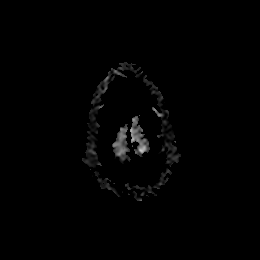

[Series 7: DWI · coronal · 5.0mm · 0.88mm/px · 2 of 28 slices shown (4 of 6)]
[im 1/28]
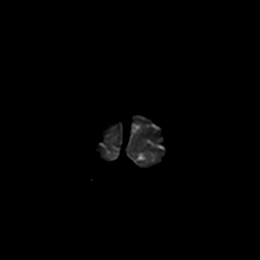
[im 28/28]
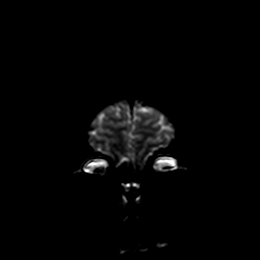

[Series 7: DWI · coronal · 5.0mm · 0.88mm/px · 2 of 28 slices shown (5 of 6)]
[im 1/28]
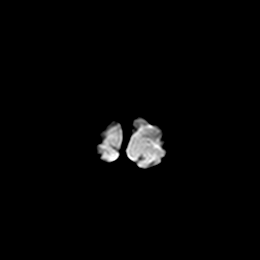
[im 28/28]
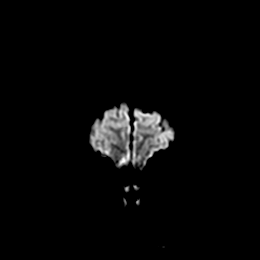

[Series 8: DWI · coronal · 5.0mm · 0.88mm/px · 2 of 28 slices shown (6 of 6)]
[im 1/28]
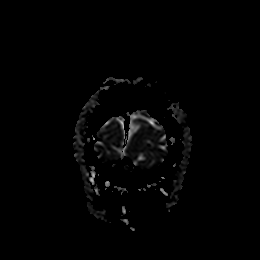
[im 28/28]
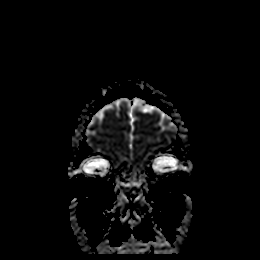

[Series 9: T1 · sagittal · 5.0mm · 0.94mm/px · 1 of 19 slices shown]
[im 1/19]
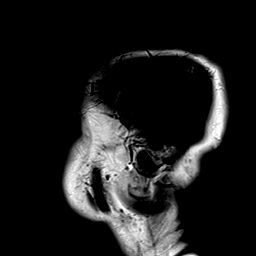

[Series 10: T2 · axial · 5.0mm · 0.72mm/px · 1 of 18 slices shown (1 of 2)]
[im 1/18]
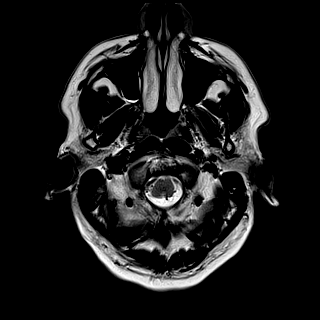

[Series 16: ax hemo · axial · 5.0mm · 0.86mm/px · z∈[-60,+83]mm · 2 of 25 slices shown]
[im 1/25]
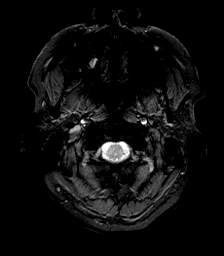
[im 25/25]
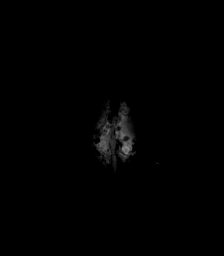

[Series 17: FLAIR · axial · 4.0mm · 0.43mm/px · z∈[-50,+73]mm · 2 of 32 slices shown]
[im 1/32]
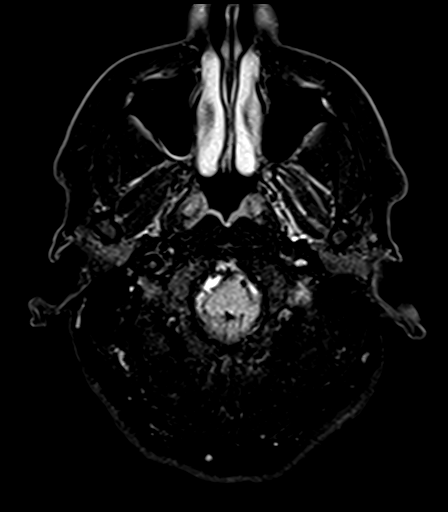
[im 32/32]
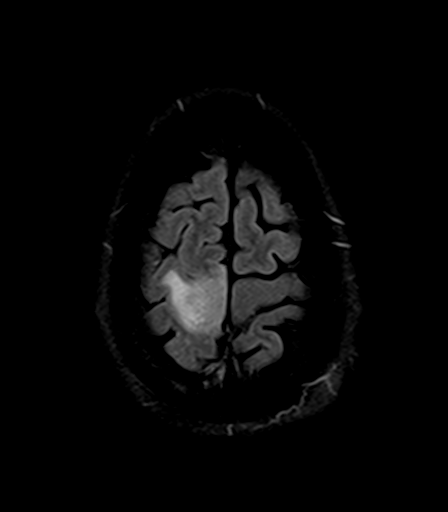

[Series 19: T2 · coronal · 5.0mm · 0.72mm/px · 2 of 28 slices shown (2 of 2)]
[im 1/28]
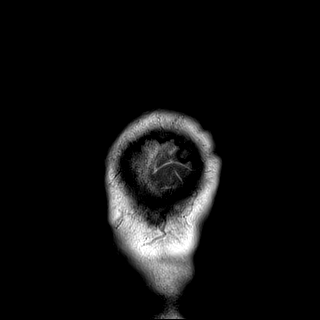
[im 28/28]
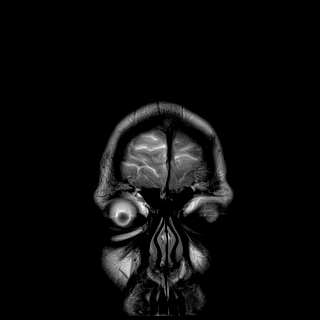

[Series 21: T1 post-contrast · coronal · 5.0mm · 0.34mm/px · 2 of 28 slices shown (1 of 2)]
[im 1/28]
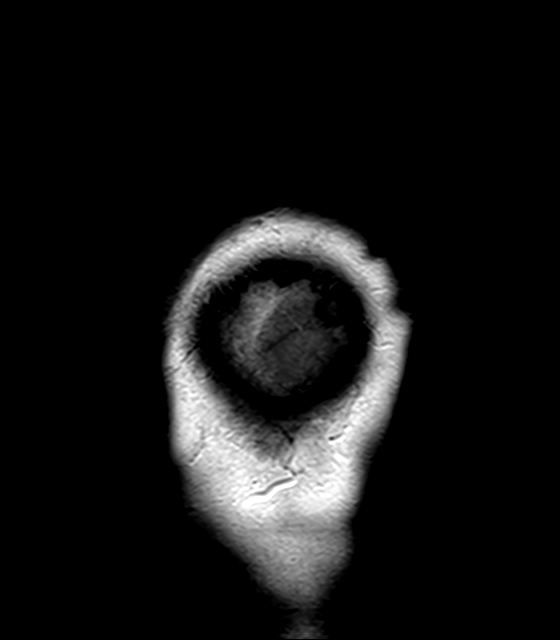
[im 28/28]
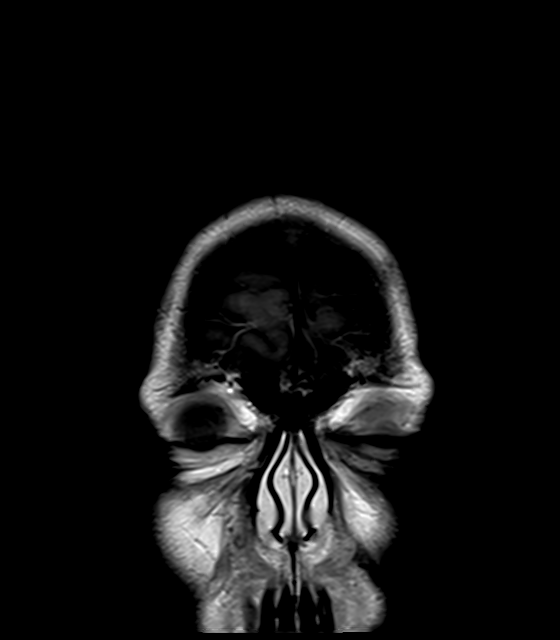

[Series 22: T1 post-contrast · sagittal · 5.0mm · 0.94mm/px · 1 of 19 slices shown (2 of 2)]
[im 1/19]
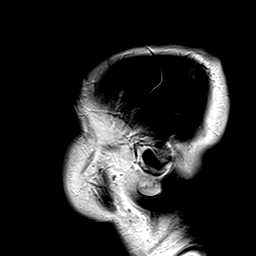

[26 of 48 positions shown; findings below may reference images not displayed]

FINDINGS: MRI HEAD FINDINGS

Brain: Diffusion imaging does not show any acute or subacute
infarction. The brainstem and cerebellum are normal. Cerebral
hemispheres show a few punctate foci of T2 and FLAIR signal in the
white matter, not likely significant. There is mass lesion which is
favored to be extra-axial along the right side of the falx at the
frontoparietal vertex measuring 2.9 cm front to back, 2.0 cm right
to left and 2.6 cm cephalo caudal. The broadest base is along the
falx. However, the lesion shows fairly low level enhancement. There
is some peripheral cystic change. There is vasogenic edema within
the right frontoparietal vertex brain. There is no dural tail
enhancement. There is no compromise of the superior sagittal sinus.
Therefore, though I think the most likely diagnosis is meningioma,
atypical features do raise the possibility that this could represent
an intra-axial lesion.

No second lesion is seen. Insignificant venous angioma in the left
posterior frontal vertex region. No hydrocephalus. No extra-axial
fluid collection.

Vascular: Major vessels at the base of the brain show flow.

Skull and upper cervical spine: Negative

Sinuses/Orbits: Clear/normal

Other: None

MRA HEAD FINDINGS

Both internal carotid arteries are widely patent into the brain. No
siphon stenosis. The anterior and middle cerebral vessels are patent
without proximal stenosis, aneurysm or vascular malformation. Left
PCA takes fetal origin from the anterior circulation.

Both vertebral arteries are widely patent to the basilar. No basilar
stenosis. Posterior circulation branch vessels appear normal.
IMPRESSION: 2.9 x 2.0 x 2.6 cm mass at the medial right frontoparietal vertex. I
think the likelihood is that this is extra-axial and represents a
meningioma with some atypical features. There is associated
vasogenic edema at the right frontoparietal brain vertex. I do think
there is some possibility that this could represent an intra-axial
lesion, as discussed above. Neuro surgical referral is recommended.
One could consider close follow-up with repeat scan versus biopsy.

The intracranial MR angiogram is normal.

## 2021-05-09 MED ORDER — ACETAMINOPHEN 325 MG PO TABS
650.0000 mg | ORAL_TABLET | Freq: Once | ORAL | Status: AC
  Administered 2021-05-09: 650 mg via ORAL
  Filled 2021-05-09: qty 2

## 2021-05-09 MED ORDER — GADOBUTROL 1 MMOL/ML IV SOLN
10.0000 mL | Freq: Once | INTRAVENOUS | Status: AC | PRN
  Administered 2021-05-09: 10 mL via INTRAVENOUS

## 2021-05-09 NOTE — Discharge Instructions (Signed)
You were seen in the emergency department today for an MRI of your brain.  As we discussed you have a follow-up appointment with this week with Dr. Annette Stable who is a neurosurgeon.  In the meantime continue to take your dexamethasone 4 mg twice a day.  You should take this until you have follow-up with neurosurgery who will then give you instruction about medication.  Please return to the emergency department for signs and symptoms of stroke which we discussed during your visit. ?

## 2021-05-09 NOTE — ED Provider Notes (Cosign Needed)
[facility] EMERGENCY DEPARTMENT Provider Note   CSN: [id number] Arrival date & time: 05/09/21  1217     History  Chief Complaint  Patient presents with   brain mass    Jerry Richards is a 51 y.o. male.  With past medical history of CAD, OSA, atrial flutter anticoagulated on Xarelto who presents emergency department for MRI.  Patient was seen here yesterday in the emergency department for concerns over progressively worsening symptoms including left leg spasms, difficulty typing with his left hand, left face numbness.  He had a work-up yesterday including a CT of his head which showed a paramedian mass at the right vertex, abutting the falx, 2.3 x 1.6 x 2.4 cm, associated with mild adjacent vasogenic edema within the high right frontal lobe.  He was to have MRI outpatient which was ordered for him and he was discharged.  He states that he presented to to [facility] today to have his MRI done and they instructed him to present to the emergency department.  He does not have new symptoms as of yesterday.  He states that he feels like his left leg spasms are worsening.  He states that he is taking baclofen for this per neurology.  HPI     Home Medications Prior to Admission medications   Medication Sig Start Date End Date Taking? Authorizing Provider  baclofen (LIORESAL) 10 MG tablet Take 1 tablet (10 mg total) by mouth 3 (three) times daily. 04/28/21   Sater, Nanine Means, MD  dexamethasone (DECADRON) 4 MG tablet Take 1 tablet (4 mg total) by mouth 2 (two) times daily with a meal. 05/08/21   Hayden Rasmussen, MD  losartan (COZAAR) 50 MG tablet Take 50 mg by mouth daily. 12/09/15   [provider]  rivaroxaban (XARELTO) 20 MG TABS tablet Take 1 tablet (20 mg total) by mouth daily. 03/10/21         Allergies    Amiodarone and Morphine and related    Review of Systems   Review of Systems  Neurological:  Positive for weakness, numbness and headaches.  All other systems reviewed  and are negative.  Physical Exam Updated Vital Signs BP (!) 126/91 (BP Location: Right Arm)    Pulse 68    Temp 98.1 F (36.7 C) (Oral)    Resp 18    Ht 6' (1.829 m)    Wt 95.3 kg    SpO2 100%    BMI 28.48 kg/m  Physical Exam Vitals and nursing note reviewed.  Constitutional:      General: He is not in acute distress.    Appearance: Normal appearance. He is not ill-appearing.  HENT:     Head: Normocephalic and atraumatic.     Mouth/Throat:     Mouth: Mucous membranes are moist.     Pharynx: Oropharynx is clear.  Eyes:     General: No scleral icterus.    Extraocular Movements: Extraocular movements intact.     Conjunctiva/sclera: Conjunctivae normal.     Pupils: Pupils are equal, round, and reactive to light.  Cardiovascular:     Rate and Rhythm: Normal rate and regular rhythm.     Pulses: Normal pulses.     Heart sounds: Normal heart sounds. No murmur heard. Pulmonary:     Effort: Pulmonary effort is normal. No respiratory distress.     Breath sounds: Normal breath sounds.  Abdominal:     General: Bowel sounds are normal.     Palpations: Abdomen is soft.  Musculoskeletal:     Cervical back: Neck supple.  Skin:    General: Skin is warm and dry.     Capillary Refill: Capillary refill takes less than 2 seconds.  Neurological:     Mental Status: He is alert and oriented to person, place, and time.     GCS: GCS eye subscore is 4. GCS verbal subscore is 5. GCS motor subscore is 6.     Cranial Nerves: Cranial nerves 2-12 are intact.     Sensory: Sensation is intact.     Motor: Weakness present. No tremor or pronator drift.     Coordination: Coordination is intact.     Comments: Left lower extremity strength 2/5  Psychiatric:        Mood and Affect: Mood normal.        Behavior: Behavior normal.        Thought Content: Thought content normal.        Judgment: Judgment normal.    ED Results / Procedures / Treatments   Labs (all labs ordered are listed, but only abnormal  results are displayed) Labs Reviewed  BASIC METABOLIC PANEL - Abnormal; Notable for the following components:      Result Value   Glucose, Bld 116 (*)    BUN 28 (*)    Creatinine, Ser 1.32 (*)    All other components within normal limits  CBC WITH DIFFERENTIAL/PLATELET - Abnormal; Notable for the following components:   WBC 17.2 (*)    Neutro Abs 15.8 (*)    Abs Immature Granulocytes 0.08 (*)    All other components within normal limits   EKG None  Radiology CT HEAD WO CONTRAST  Result Date: 05/08/2021 CLINICAL DATA:  New onset LEFT facial and arm numbness, history atrial fibrillation, smoker EXAM: CT HEAD WITHOUT CONTRAST TECHNIQUE: Contiguous axial images were obtained from the base of the skull through the vertex without intravenous contrast. RADIATION DOSE REDUCTION: This exam was performed according to the departmental dose-optimization program which includes automated exposure control, adjustment of the mA and/or kV according to patient size and/or use of iterative reconstruction technique. COMPARISON:  None FINDINGS: Brain: Mild asymmetry of the lateral ventricles larger on RIGHT than LEFT. Minimally hyperdense paramedian mass at RIGHT vertex, 2.3 x 1.6 x 2.4 cm, abutting the falx, associated with mild adjacent vasogenic edema within the high RIGHT frontal lobe. 2 mm of RIGHT to LEFT midline shift. No additional mass, hemorrhage, or infarction. No extra-axial fluid collections. Vascular: No hyperdense vessels Skull: Intact Sinuses/Orbits: Clear Other: N/A IMPRESSION: Minimally hyperdense paramedian mass at the RIGHT vertex, abutting the falx, 2.3 x 1.6 x 2.4 cm, associated with mild adjacent vasogenic edema within the high RIGHT frontal lobe. This could represent a para falcine meningioma or primary versus metastatic intra-axial mass at the RIGHT vertex. Follow-up characterization by MR imaging with and without contrast recommended. Minimal RIGHT to LEFT midline shift. No additional  intracranial abnormalities. Findings discussed with Dr. Melina Copa on 05/08/2021 at 2131 hours. Electronically Signed   By: Lavonia Dana M.D.   On: 05/08/2021 21:32   MR ANGIO HEAD WO CONTRAST  Result Date: 05/09/2021 CLINICAL DATA:  Headache, new or worsening.  Weakness. EXAM: MRI HEAD WITHOUT AND WITH CONTRAST MRA HEAD WITHOUT CONTRAST TECHNIQUE: Multiplanar, multi-echo pulse sequences of the brain and surrounding structures were acquired without and with intravenous contrast. Angiographic images of the [place] of [place] were acquired using MRA technique without intravenous contrast. CONTRAST:  95m GADAVIST GADOBUTROL 1 MMOL/ML IV SOLN  COMPARISON:  Head CT yesterday. FINDINGS: MRI HEAD FINDINGS Brain: Diffusion imaging does not show any acute or subacute infarction. The brainstem and cerebellum are normal. Cerebral hemispheres show a few punctate foci of T2 and FLAIR signal in the white matter, not likely significant. There is mass lesion which is favored to be extra-axial along the right side of the falx at the frontoparietal vertex measuring 2.9 cm front to back, 2.0 cm right to left and 2.6 cm cephalo caudal. The broadest base is along the falx. However, the lesion shows fairly low level enhancement. There is some peripheral cystic change. There is vasogenic edema within the right frontoparietal vertex brain. There is no dural tail enhancement. There is no compromise of the superior sagittal sinus. Therefore, though I think the most likely diagnosis is meningioma, atypical features do raise the possibility that this could represent an intra-axial lesion. No second lesion is seen. Insignificant venous angioma in the left posterior frontal vertex region. No hydrocephalus. No extra-axial fluid collection. Vascular: Major vessels at the base of the brain show flow. Skull and upper cervical spine: Negative Sinuses/Orbits: Clear/normal Other: None MRA HEAD FINDINGS Both internal carotid arteries are widely patent into  the brain. No siphon stenosis. The anterior and middle cerebral vessels are patent without proximal stenosis, aneurysm or vascular malformation. Left PCA takes fetal origin from the anterior circulation. Both vertebral arteries are widely patent to the basilar. No basilar stenosis. Posterior circulation branch vessels appear normal. IMPRESSION: 2.9 x 2.0 x 2.6 cm mass at the medial right frontoparietal vertex. I think the likelihood is that this is extra-axial and represents a meningioma with some atypical features. There is associated vasogenic edema at the right frontoparietal brain vertex. I do think there is some possibility that this could represent an intra-axial lesion, as discussed above. Neuro surgical referral is recommended. One could consider close follow-up with repeat scan versus biopsy. The intracranial MR angiogram is normal. Electronically Signed   By: Nelson Chimes M.D.   On: 05/09/2021 15:25   MR Brain W and Wo Contrast  Result Date: 05/09/2021 CLINICAL DATA:  Headache, new or worsening.  Weakness. EXAM: MRI HEAD WITHOUT AND WITH CONTRAST MRA HEAD WITHOUT CONTRAST TECHNIQUE: Multiplanar, multi-echo pulse sequences of the brain and surrounding structures were acquired without and with intravenous contrast. Angiographic images of the Circle of [place] were acquired using MRA technique without intravenous contrast. CONTRAST:  68m GADAVIST GADOBUTROL 1 MMOL/ML IV SOLN COMPARISON:  Head CT yesterday. FINDINGS: MRI HEAD FINDINGS Brain: Diffusion imaging does not show any acute or subacute infarction. The brainstem and cerebellum are normal. Cerebral hemispheres show a few punctate foci of T2 and FLAIR signal in the white matter, not likely significant. There is mass lesion which is favored to be extra-axial along the right side of the falx at the frontoparietal vertex measuring 2.9 cm front to back, 2.0 cm right to left and 2.6 cm cephalo caudal. The broadest base is along the falx. However, the  lesion shows fairly low level enhancement. There is some peripheral cystic change. There is vasogenic edema within the right frontoparietal vertex brain. There is no dural tail enhancement. There is no compromise of the superior sagittal sinus. Therefore, though I think the most likely diagnosis is meningioma, atypical features do raise the possibility that this could represent an intra-axial lesion. No second lesion is seen. Insignificant venous angioma in the left posterior frontal vertex region. No hydrocephalus. No extra-axial fluid collection. Vascular: Major vessels at the base of  the brain show flow. Skull and upper cervical spine: Negative Sinuses/Orbits: Clear/normal Other: None MRA HEAD FINDINGS Both internal carotid arteries are widely patent into the brain. No siphon stenosis. The anterior and middle cerebral vessels are patent without proximal stenosis, aneurysm or vascular malformation. Left PCA takes fetal origin from the anterior circulation. Both vertebral arteries are widely patent to the basilar. No basilar stenosis. Posterior circulation branch vessels appear normal. IMPRESSION: 2.9 x 2.0 x 2.6 cm mass at the medial right frontoparietal vertex. I think the likelihood is that this is extra-axial and represents a meningioma with some atypical features. There is associated vasogenic edema at the right frontoparietal brain vertex. I do think there is some possibility that this could represent an intra-axial lesion, as discussed above. Neuro surgical referral is recommended. One could consider close follow-up with repeat scan versus biopsy. The intracranial MR angiogram is normal. Electronically Signed   By: Nelson Chimes M.D.   On: 05/09/2021 15:25    Procedures Procedures   Medications Ordered in ED Medications  acetaminophen (TYLENOL) tablet 650 mg (650 mg Oral Given 05/09/21 1420)  gadobutrol (GADAVIST) 1 MMOL/ML injection 10 mL (10 mLs Intravenous Contrast Given 05/09/21 1436)   ED Course/  Medical Decision Making/ A&P                           Medical Decision Making Amount and/or Complexity of Data Reviewed Labs: ordered. Radiology: ordered.  Risk OTC drugs. Prescription drug management.  This patient presents to the ED for concern of brain mass, this involves an extensive number of treatment options, and is a complaint that carries with it a high risk of complications and morbidity.  The differential diagnosis includes primary brain tumor, metastasis, ICH   Co morbidities that complicate the patient evaluation None  Additional history obtained:  Additional history obtained from: Wife at bedside External records from outside source obtained and reviewed including: ED visit yesterday  Lab Results: I personally ordered, reviewed, and interpreted labs. Pertinent results include: Leukocytosis 17.2, likely related to the steroids  Imaging Studies ordered:  I ordered imaging studies which included MRI.  I independently reviewed & interpreted imaging & am in agreement with radiology impression. Imaging shows: 2.9 x 2.0 x 2.6 cm mass at the medial right frontoparietal vertex.  Medications  I ordered medication including Tylenol for headache Reevaluation of the patient after medication shows that patient improved  Consultations: I requested consultation with the neurosurgery PA [place],  and discussed lab and imaging findings as well as pertinent plan - they recommend: Follow-up appointment this week  ED Course: 51 year old male who presents to the emergency department with known brain mass, seen yesterday in the emergency department and discharged with MRI follow-up.  He had difficulty scheduling MRI follow-up and was sent here to the emergency department.  MRI obtained which shows the 2.9 x 2.0 x 2.6 cm in the medial right frontoparietal vertex.  He states he has no new symptoms compared to yesterday.  He does have intermittent left-sided facial numbness.  He  states that he is having difficulty typing with the left hand and his pinky will miss the keys.  Additionally he is having left lower extremity muscle spasms as well as weakness.  Spoke with Bonne Dolores with [facility].  She states that the patient will follow-up with Dr. Irven Baltimore this week.  She recommends that we have enough Decadron ordered outpatient for him to make it to his  appointment.  I have reviewed this and it appears that he has a dexamethasone 4 mg twice daily prescription for 15 days.  I discussed with patient and wife at bedside that the MRI shows mass from yesterday.  Discussed use of steroids and that he should continue taking these until he is able to follow-up with Dr. Trenton Gammon.  I additionally provided them with the MRI read as requested by neurosurgery.  I have given him strict return precautions for acute strokelike symptoms.  They verbalized understanding.  After consideration of the diagnostic results and the patients response to treatment, I feel that the patent would benefit from discharge. The patient has been appropriately medically screened and/or stabilized in the ED. I have low suspicion for any other emergent medical condition which would require further screening, evaluation or treatment in the ED or require inpatient management. The patient is overall well appearing and non-toxic in appearance. They are hemodynamically stable at time of discharge.   Final Clinical Impression(s) / ED Diagnoses Final diagnoses:  Brain mass    Rx / DC Orders ED Discharge Orders     None         Mickie Hillier, PA-C 05/09/21 1742

## 2021-05-09 NOTE — ED Triage Notes (Signed)
Per pt's wife pt was seen here yesterday for left sided weakness  and left side of face drooping.  Reports found a brain mass on CT last night and was told needs an MRI.   Reports took a lyrica pta to help treat left leg spasms.  Pt alert and oriented.   ?

## 2021-05-09 NOTE — ED Notes (Signed)
Patient to MRI at this time.

## 2021-05-09 NOTE — ED Notes (Signed)
Wife states she attempted to set up appt with Dr. Trenton Gammon but says when she called they were told they didn't have a referral.  Spoke to operator at Dr. Irven Baltimore office and was transferred to patient coordinator's voice mail.  Left message for them to call back to ED staff or call pt's wife to set up the appt.   ?

## 2021-05-10 ENCOUNTER — Telehealth: Payer: Self-pay | Admitting: Internal Medicine

## 2021-05-10 ENCOUNTER — Telehealth: Payer: Self-pay | Admitting: *Deleted

## 2021-05-10 ENCOUNTER — Other Ambulatory Visit: Payer: Self-pay | Admitting: Neurosurgery

## 2021-05-10 NOTE — Telephone Encounter (Signed)
Patient with diagnosis of afib on Xarelto for anticoagulation.   ? ?Procedure: Craniotomy Brain Tumor ?Date of procedure: 05/16/21 ? ?CHA2DS2-VASc Score = 1  ? This indicates a 0.6% annual risk of stroke. ?The patient's score is based upon: ?CHF History: 1 ?HTN History: 0 ?Diabetes History: 0 ?Stroke History: 0 ?Vascular Disease History: 0 ?Age Score: 0 ?Gender Score: 0 ?  ?   ?Patient does have a hx of LA thrombus which has resolved.  ?CrCl 78 ml/min ? ?Per office protocol, patient can hold Xarelto for 3 days prior to procedure.   ? ?

## 2021-05-10 NOTE — Telephone Encounter (Signed)
Clinical pharmacist to review Xarelto 

## 2021-05-10 NOTE — Telephone Encounter (Signed)
? ?  Pre-operative Risk Assessment  ?  ?Patient Name: Jerry Richards  ?DOB: 02-07-1971 ?MRN: [id number]  ? ?  ? ?Request for Surgical Clearance   ? ?Procedure:   Craniotomy  Brain Tumor ? ?Date of Surgery:  Clearance 05/16/21                              ?   ?Surgeon:  Dr. Earnie Larsson ?Surgeon's Group or Practice Name:  [facility] ?Phone number:  [phone] ?Fax number:  [phone] ?  ?Type of Clearance Requested:   ?- Medical  ?- Pharmacy:  Hold Rivaroxaban (Xarelto) need to know who many days to hold.  ?  ?Type of Anesthesia:  General  ?  ?Additional requests/questions:     ? ?Signed, ?Ermelinda Das   ?05/10/2021, 12:48 PM   ?

## 2021-05-10 NOTE — Telephone Encounter (Signed)
Pt agreeable to plan of care for tele pre op appt 05/11/21 @ 10 am. Med rec and consent done.  ?

## 2021-05-10 NOTE — Telephone Encounter (Signed)
?  Patient Consent for Virtual Visit  ? ? ?   ? ?[name] has provided verbal consent on 05/10/2021 for a virtual visit (video or telephone). ? ? ?CONSENT FOR VIRTUAL VISIT FOR:  Jerry Richards  ?By participating in this virtual visit I agree to the following: ? ?I hereby voluntarily request, consent and authorize [facility] and its employed or contracted physicians, physician assistants, nurse practitioners or other licensed health care professionals (the Practitioner), to provide me with telemedicine health care services (the ?Services") as deemed necessary by the treating Practitioner. I acknowledge and consent to receive the Services by the Practitioner via telemedicine. I understand that the telemedicine visit will involve communicating with the Practitioner through live audiovisual communication technology and the disclosure of certain medical information by electronic transmission. I acknowledge that I have been given the opportunity to request an in-person assessment or other available alternative prior to the telemedicine visit and am voluntarily participating in the telemedicine visit. ? ?I understand that I have the right to withhold or withdraw my consent to the use of telemedicine in the course of my care at any time, without affecting my right to future care or treatment, and that the Practitioner or I may terminate the telemedicine visit at any time. I understand that I have the right to inspect all information obtained and/or recorded in the course of the telemedicine visit and may receive copies of available information for a reasonable fee.  I understand that some of the potential risks of receiving the Services via telemedicine include:  ?Delay or interruption in medical evaluation due to technological equipment failure or disruption; ?Information transmitted may not be sufficient (e.g. poor resolution of images) to allow for appropriate medical decision making by the Practitioner;  and/or  ?In rare instances, security protocols could fail, causing a breach of personal health information. ? ?Furthermore, I acknowledge that it is my responsibility to provide information about my medical history, conditions and care that is complete and accurate to the best of my ability. I acknowledge that Practitioner's advice, recommendations, and/or decision may be based on factors not within their control, such as incomplete or inaccurate data provided by me or distortions of diagnostic images or specimens that may result from electronic transmissions. I understand that the practice of medicine is not an exact science and that Practitioner makes no warranties or guarantees regarding treatment outcomes. I acknowledge that a copy of this consent can be made available to me via my patient portal ([facility]), or I can request a printed copy by calling the office of [facility].   ? ?I understand that my insurance will be billed for this visit.  ? ?I have read or had this consent read to me. ?I understand the contents of this consent, which adequately explains the benefits and risks of the Services being provided via telemedicine.  ?I have been provided ample opportunity to ask questions regarding this consent and the Services and have had my questions answered to my satisfaction. ?I give my informed consent for the services to be provided through the use of telemedicine in my medical care ? ? ? ?

## 2021-05-10 NOTE — Telephone Encounter (Signed)
Patient will need telephone virtual visit for preoperative clearance after clinical pharmacist comment on Xarelto.  Please arrange that appointment either Wednesday afternoon, Thursday or Friday of this week. ?

## 2021-05-11 ENCOUNTER — Other Ambulatory Visit: Payer: Self-pay

## 2021-05-11 ENCOUNTER — Other Ambulatory Visit (HOSPITAL_COMMUNITY): Payer: Self-pay | Admitting: Neurosurgery

## 2021-05-11 ENCOUNTER — Other Ambulatory Visit: Payer: Self-pay | Admitting: Neurosurgery

## 2021-05-11 ENCOUNTER — Ambulatory Visit (INDEPENDENT_AMBULATORY_CARE_PROVIDER_SITE_OTHER): Payer: BC Managed Care – PPO | Admitting: Physician Assistant

## 2021-05-11 ENCOUNTER — Ambulatory Visit (HOSPITAL_COMMUNITY)
Admission: RE | Admit: 2021-05-11 | Discharge: 2021-05-11 | Disposition: A | Discharge status: Home / Self Care | Payer: BC Managed Care – PPO | Source: Ambulatory Visit | Attending: Neurosurgery | Admitting: Neurosurgery

## 2021-05-11 DIAGNOSIS — Z01810 Encounter for preprocedural cardiovascular examination: Secondary | ICD-10-CM | POA: Diagnosis not present

## 2021-05-11 DIAGNOSIS — D329 Benign neoplasm of meninges, unspecified: Secondary | ICD-10-CM | POA: Diagnosis present

## 2021-05-11 IMAGING — MR MR HEAD WO/W CM
15 of 16 series · 34 of 48 positions shown · IV contrast (gadavist)
Comparison: MRI [DATE].

CLINICAL DATA: Meningioma.

EXAM:
MRI HEAD WITHOUT AND WITH CONTRAST
TECHNIQUE: Multiplanar, multiecho pulse sequences of the brain and surrounding
structures were obtained without and with intravenous contrast.
CONTRAST:  9.5mL GADAVIST GADOBUTROL 1 MMOL/ML IV SOLN

[Series 2: FLAIR · sagittal · 3.0mm · 0.47mm/px · 1 of 47 slices shown (1 of 2)]
[im 1/47]
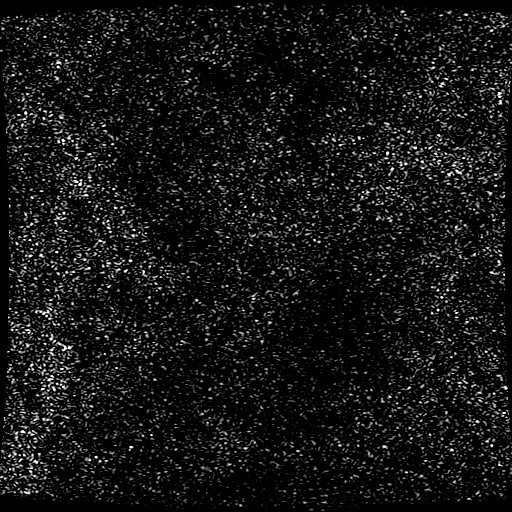

[Series 3: T2 · axial · 5.0mm · 0.47mm/px · 1 of 34 slices shown]
[im 1/34]
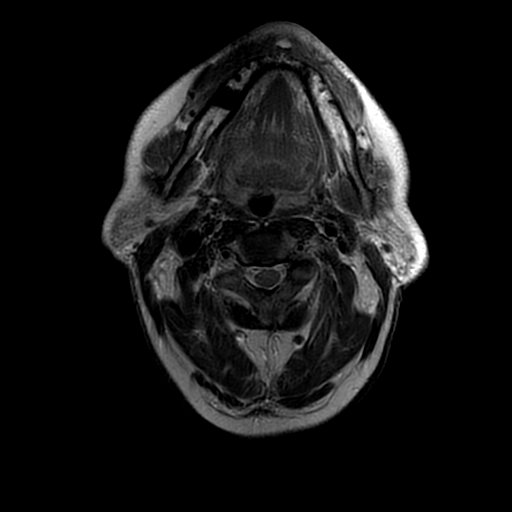

[Series 4: ax dti · axial · 3.0mm · 0.94mm/px · z∈[-105,+96]mm · 14 of 1846 slices shown]
[im 1/1846]
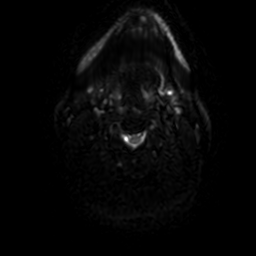
[im 84/1846]
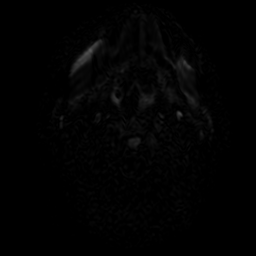
[im 168/1846]
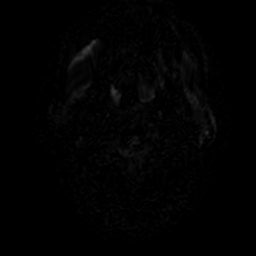
[im 252/1846]
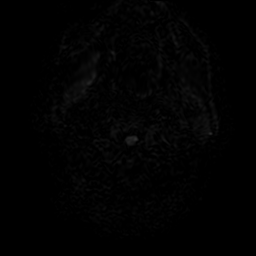
[im 336/1846]
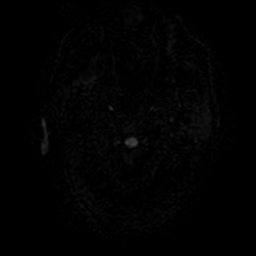
[im 420/1846]
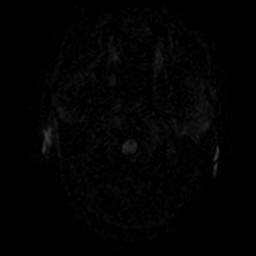
[im 504/1846]
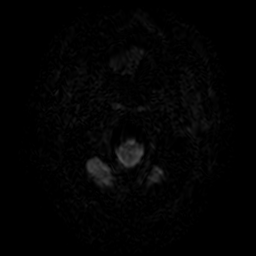
[im 588/1846]
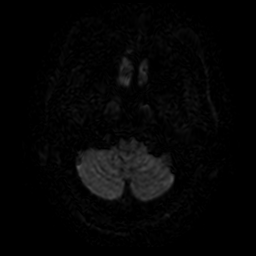
[im 671/1846]
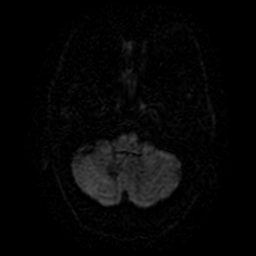
[im 839/1846]
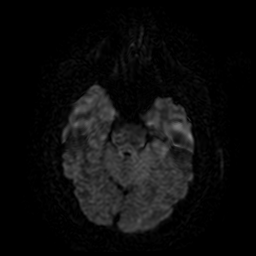
[im 1007/1846]
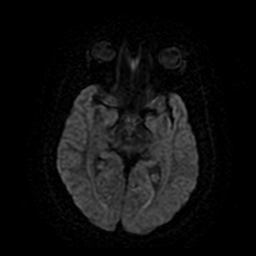
[im 1258/1846]
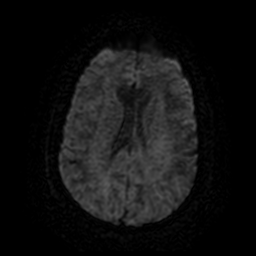
[im 1510/1846]
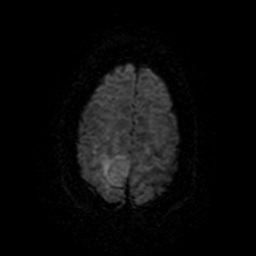
[im 1762/1846]
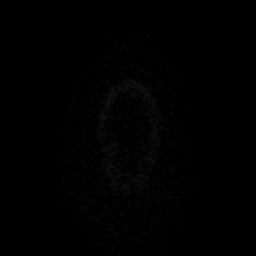

[Series 6: (person_name) · axial · 2.9mm · 0.47mm/px · 1 of 110 slices shown]
[im 1/110]
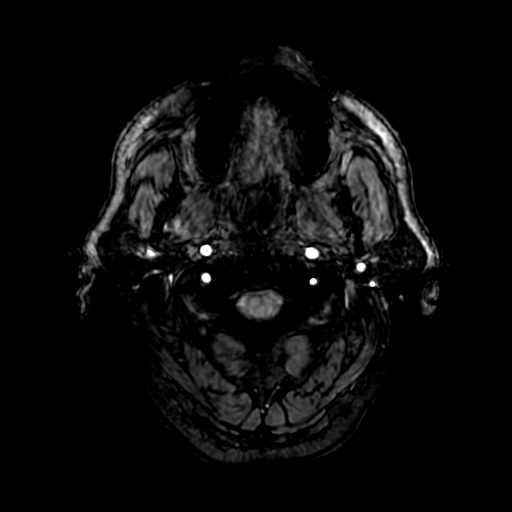

[Series 7: ax 3(person_name) · axial · 1.0mm · 1.02mm/px · z∈[-87,+106]mm · 2 of 194 slices shown (1 of 2)]
[im 1/194]
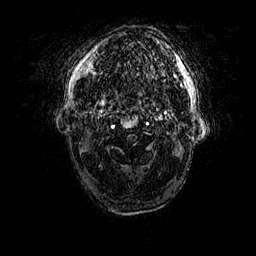
[im 194/194]
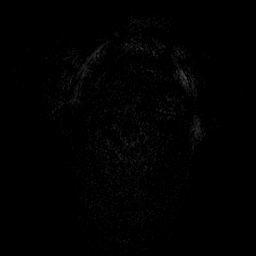

[Series 8: T2 post-contrast · coronal · 3.0mm · 0.39mm/px · 1 of 76 slices shown]
[im 1/76]
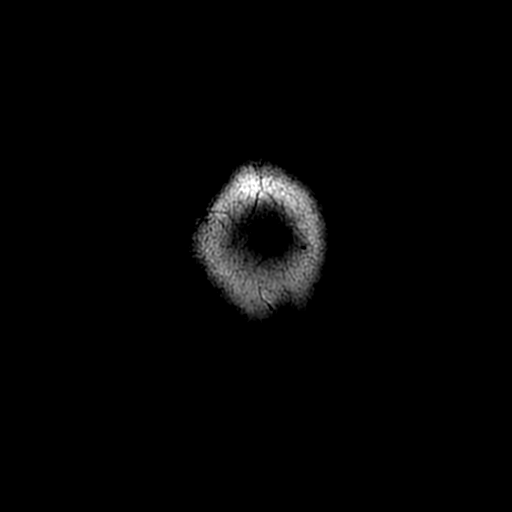

[Series 9: ax 3(person_name) · axial · 1.0mm · 1.02mm/px · z∈[-87,+106]mm · 2 of 194 slices shown (2 of 2)]
[im 1/194]
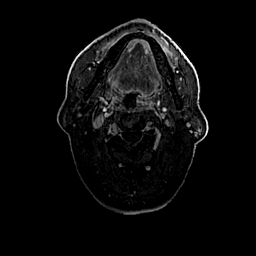
[im 194/194]
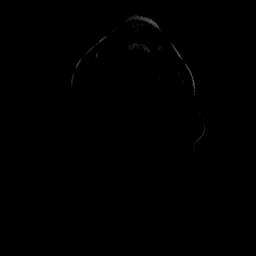

[Series 10: T1 · coronal · 3.0mm · 0.39mm/px · 1 of 75 slices shown]
[im 1/75]
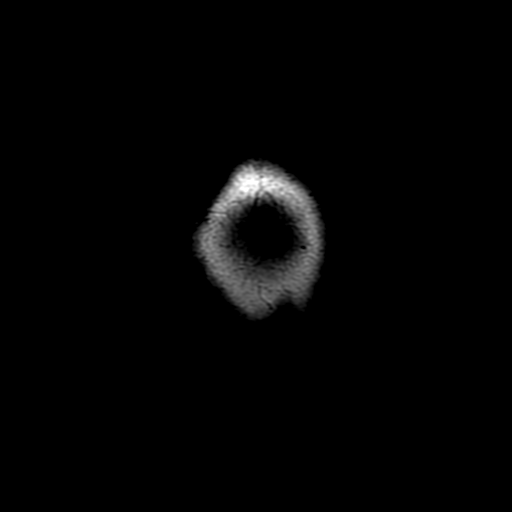

[Series 11: FLAIR · sagittal · 3.0mm · 0.47mm/px · 1 of 47 slices shown (2 of 2)]
[im 1/47]
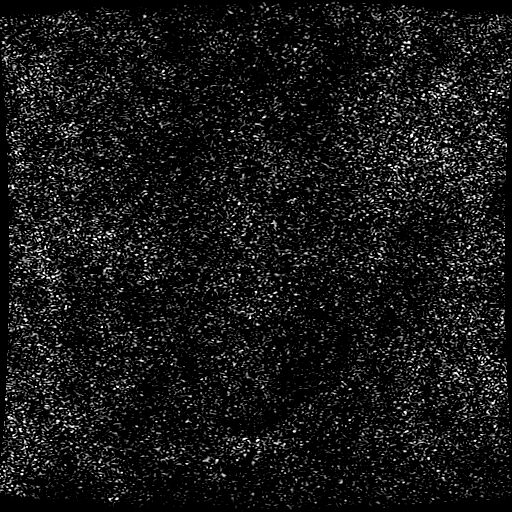

[Series 450: trace · axial · 3.0mm · 0.94mm/px · 1 of 71 slices shown]
[im 1/71]
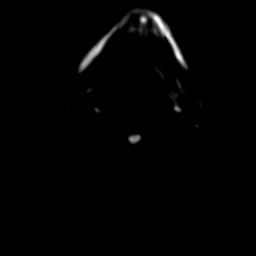

[Series 451: fa(no-q) · axial · 3.0mm · 0.94mm/px · 1 of 71 slices shown]
[im 1/71]
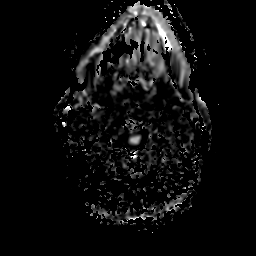

[Series 452: avdc (10^-6 mm²/s)(no-q) · axial · 3.0mm · 0.94mm/px · 1 of 71 slices shown]
[im 1/71]
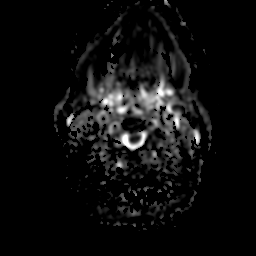

[Series 500: multiplanar reconstruction (mpr) · axial · 1.0mm · 0.50mm/px · z∈[-138,+117]mm · 3 of 256 slices shown (1 of 2)]
[im 1/256]
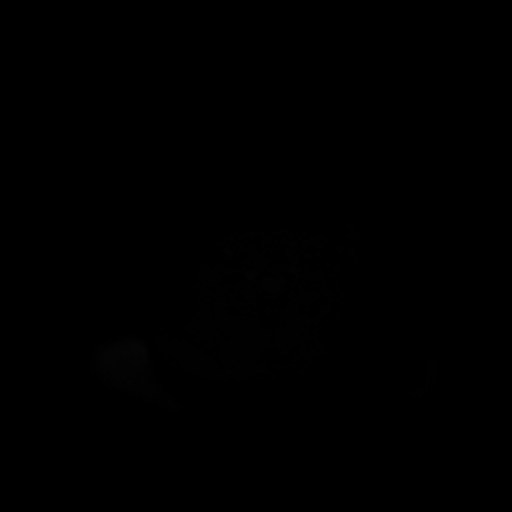
[im 128/256]
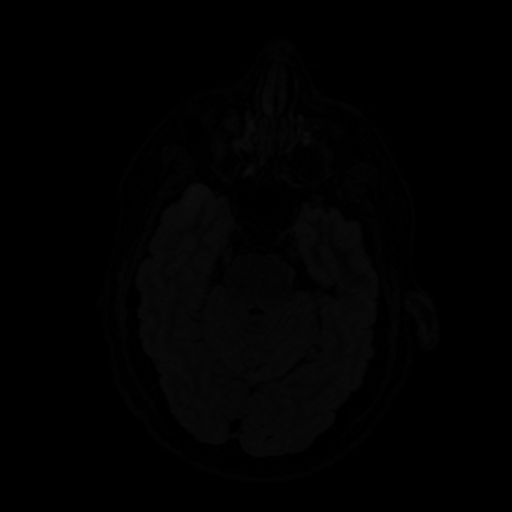
[im 256/256]
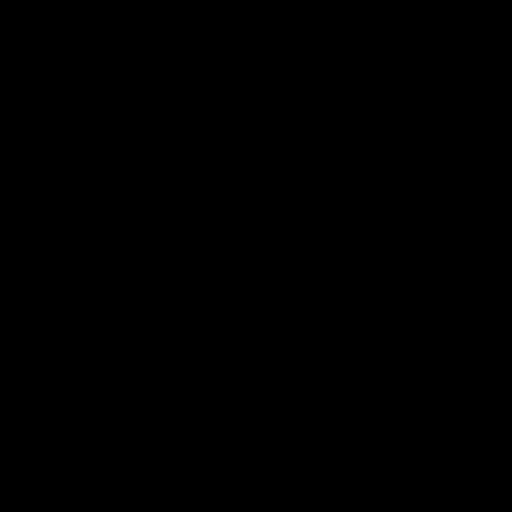

[Series 501: multiplanar reconstruction (mpr) · coronal · 1.0mm · 0.50mm/px · 3 of 242 slices shown (2 of 2)]
[im 1/242]
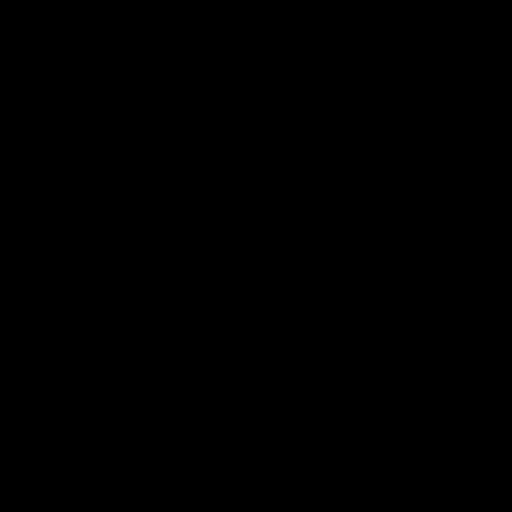
[im 121/242]
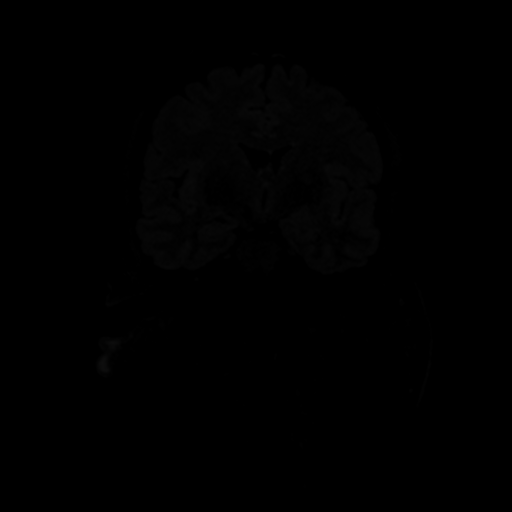
[im 242/242]
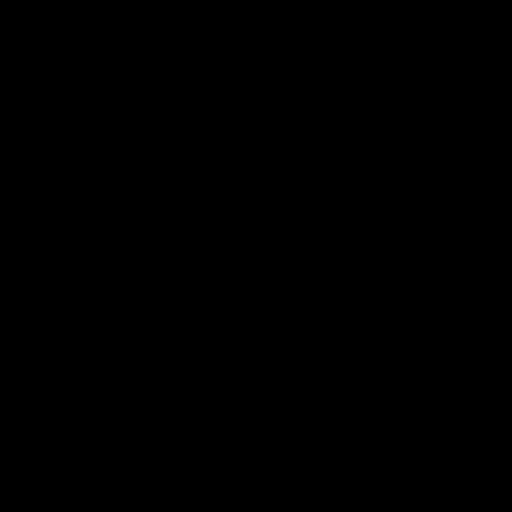

[Series 600: filt_pha: (person_name) · axial · 2.9mm · 0.47mm/px · 1 of 109 slices shown]
[im 1/109]
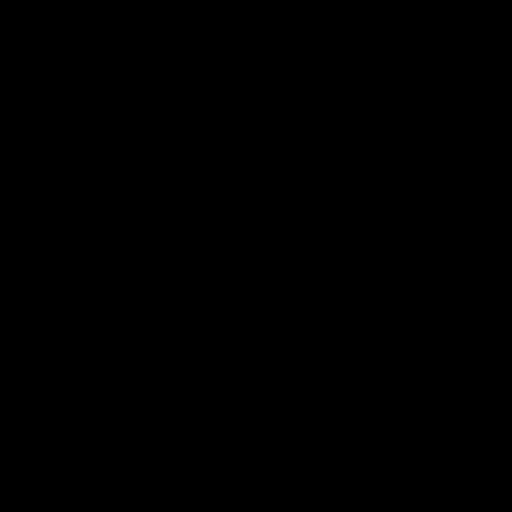

[34 of 48 positions shown; findings below may reference images not displayed]

FINDINGS: Brain: Preoperative planning MRI with similar appearance/size of a
heterogeneously enhancing, partially necrotic mass at the right
frontoparietal vertex, abutting the falx. As before, this lesion
demonstrates restricted diffusion suggestive of cellularity. No
other areas of abnormal enhancement. No evidence of acute infarct,
midline shift, acute hemorrhage, or hydrocephalus.

Vascular: Major arterial flow voids are maintained the skull base.

Skull and upper cervical spine: Normal marrow signal.

Sinuses/Orbits: Largely clear sinuses.  Unremarkable orbits.

Other: No mastoid effusions.
IMPRESSION: Preoperative planning MRI with similar appearance/size of a
heterogeneously enhancing, partially necrotic mass at the right
frontoparietal vertex, as described on recent MRI. Similar
surrounding vasogenic edema.

## 2021-05-11 MED ORDER — GADOBUTROL 1 MMOL/ML IV SOLN
9.5000 mL | Freq: Once | INTRAVENOUS | Status: AC | PRN
  Administered 2021-05-11: 9.5 mL via INTRAVENOUS

## 2021-05-11 NOTE — Progress Notes (Signed)
? ?Virtual Visit via Telephone Note  ? ?This visit type was conducted due to national recommendations for restrictions regarding the COVID-19 Pandemic (e.g. social distancing) in an effort to limit this patient's exposure and mitigate transmission in our community.  Due to his co-morbid illnesses, this patient is at least at moderate risk for complications without adequate follow up.  This format is felt to be most appropriate for this patient at this time.  The patient did not have access to video technology/had technical difficulties with video requiring transitioning to audio format only (telephone).  All issues noted in this document were discussed and addressed.  No physical exam could be performed with this format.  Please refer to the patient's chart for his  consent to telehealth for [facility]. ?Evaluation Performed:  Preoperative cardiovascular risk assessment ? ?This visit type was conducted due to national recommendations for restrictions regarding the COVID-19 Pandemic (e.g. social distancing).  This format is felt to be most appropriate for this patient at this time.  All issues noted in this document were discussed and addressed.  No physical exam was performed (except for noted visual exam findings with Video Visits).  Please refer to the patient's chart (MyChart message for video visits and phone note for telephone visits) for the patient's consent to telehealth for [facility]. ?_____________  ? ?Date:  05/11/2021  ? ?Patient ID:  Jerry Richards, DOB 1970/09/07, MRN [id number] ?Patient Location:  ?Home ?Provider location:   ?Office ? ?Primary Care Provider:  [name], MD ?Primary Cardiologist:  None ? ?Chief Complaint  ?  ?51 y.o. y/o male with a h/o A-fib s/p 3 different A-fib ablation, excessive EtOH use, CKD, hypothyroidism and history of cardiomyopathy with improved EF, who is pending craniotomy for brain mass, and presents today for telephonic preoperative cardiovascular risk  assessment. ? ?Past Medical History  ?  ?Past Medical History:  ?Diagnosis Date  ? Anxiety   ? Atrial fibrillation ([place])   ? a. s/p RFCA x 2 (10/26/2011, 04/15/2012)  ? Atrial flutter ([place])   ? Chronic kidney disease   ? Clotting disorder ([place])   ? Colitis   ? Current smoker   ? Diverticulitis   ? 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 LA thrombus   ? Non-ischemic cardiomyopathy ([place])   ? OSA on CPAP   ? Sinus trouble   ? Sleep apnea   ? Systolic heart failure ([place])   ? EF 20-25%-> 50-55% in 10/2011. MOST RECENT ECHO (03/2012) EF= 66%  ? ?Past Surgical History:  ?Procedure Laterality Date  ? ATRIAL ABLATION SURGERY    ? CARDIAC CATHETERIZATION  08/2011  ? [facility]  ? CHOLECYSTECTOMY    ? FINGER SURGERY    ? LEFT HEART CATHETERIZATION WITH CORONARY ANGIOGRAM N/A 09/12/2011  ? Procedure: LEFT HEART CATHETERIZATION WITH CORONARY ANGIOGRAM;  Surgeon: Hillary Bow, MD;  Location: [facility] CATH LAB;  Service: Cardiovascular;  Laterality: N/A;  ? VASECTOMY    ? ? ?Allergies ? ?Allergies  ?Allergen Reactions  ? Amiodarone   ?  AMIODARONE ANALOGUES ?Hyperthyroidism  ? Morphine And Related   ?  Chest pressure/palpitations  ? ? ?History of Present Illness  ?  ?Jerry Richards is a 51 y.o. male who presents via audio/video conferencing for a telehealth visit today.  Pt was last seen in cardiology clinic on 08/17/2020, by Dr. Crissie Sickles.  At that time Jerry Richards was doing well  and maintaining sinus rhythm.  he is now pending craniotomy for brain mass.  Since his last visit, he has been doing well without exertional chest pain or worsening shortness of breath.  Functional ability is mainly limited by nerve issue and brain mass.  He is still working on a daily basis and has no problem walking on flat ground.  He does have issue climbing ladders due to balance issue.  He is able to accomplish more than 4 METS of activity.  Patient does not have prior history of CAD. ? ? ?Home Medications  ?   ?Prior to Admission medications   ?Medication Sig Start Date End Date Taking? Authorizing Provider  ?baclofen (LIORESAL) 10 MG tablet Take 1 tablet (10 mg total) by mouth 3 (three) times daily. 04/28/21   Sater, Nanine Means, MD  ?buPROPion (WELLBUTRIN XL) 150 MG 24 hr tablet Take 150 mg by mouth daily. 03/22/21   [provider]  ?dexamethasone (DECADRON) 4 MG tablet Take 1 tablet (4 mg total) by mouth 2 (two) times daily with a meal. 05/08/21   Hayden Rasmussen, MD  ?gabapentin (NEURONTIN) 100 MG capsule Take 100 mg by mouth 3 (three) times daily. 04/25/21   [provider]  ?losartan (COZAAR) 50 MG tablet Take 50 mg by mouth daily. 12/09/15   [provider]  ?rivaroxaban (XARELTO) 20 MG TABS tablet Take 1 tablet (20 mg total) by mouth daily. 03/10/21     ? ? ?Physical Exam  ?  ?Vital Signs:  Jerry Richards does not have vital signs available for review today. ? ?Given telephonic nature of communication, physical exam is limited. ?AAOx3. NAD. Normal affect.  Speech and respirations are unlabored. ? ?Accessory Clinical Findings  ?  ?None ? ?Assessment & Plan  ?  ?1.  Preoperative Cardiovascular Risk Assessment: ? -Patient does not have prior history of CAD.  He has history of atrial fibrillation related to alcohol intake.  Patient also has a history of tachycardia mediated cardiomyopathy that improved after restoration of sinus rhythm.  He has underwent multiple ablation procedures in the past.  Most recent EKG obtained on 05/08/2021 continue to show sinus rhythm without significant ST-T wave changes.  He denies any recent exertional chest discomfort.  Recent CT of the head shows a intracranial mass.  He has urgent craniotomy procedure scheduled for next Monday.  Patient is cleared to proceed from cardiac perspective.  Our clinical pharmacist recommend he hold his Xarelto for 3 days prior to the procedure.  We will defer to the surgeon to decide what is the earliest time to restart on the  Xarelto afterward. ? ?COVID-19 Education: ?The signs and symptoms of COVID-19 were discussed with the patient and how to seek care for testing (follow up with PCP or arrange E-visit).  The importance of social distancing was discussed today. ? ?Patient Risk:   ?After full review of this patient's history and clinical status, I feel that he is at least moderate risk for cardiac complications at this time, thus necessitating a telehealth visit sooner than our first available in office visit. ? ?Time:   ?Today, I have spent 6 minutes with the patient with telehealth technology discussing medical history, symptoms, and management plan.   ? ? ?Almyra Deforest, [place] ? ?05/11/2021, 10:00 AM ? ?

## 2021-05-12 NOTE — Pre-Procedure Instructions (Signed)
Surgical Instructions ? ? ? Your procedure is scheduled on Monday, March 20. ? Report to [facility] Main Entrance "A" at 12:20 A.M., then check in with the Admitting office. ? Call this number if you have problems the morning of surgery: ? [phone] ? ? If you have any questions prior to your surgery date call [phone]: Open Monday-Friday 8am-4pm ? ? ? Remember: ? Do not eat or drink after midnight the night before your surgery ? ?  ? Take these medicines the morning of surgery with A SIP OF WATER:  ? ?baclofen (LIORESAL) ?buPROPion (WELLBUTRIN XL)  ?dexamethasone (DECADRON) ?gabapentin (NEURONTIN) 100  ?levETIRAcetam (KEPPRA) ?acetaminophen (TYLENOL) if needed ? ?Follow your surgeon's instructions on stopping Rivaroxaban (Xarelto) ? ?As of today, STOP taking any Aspirin (unless otherwise instructed by your surgeon) Aleve, Naproxen, Ibuprofen, Motrin, Advil, Goody's, BC's, all herbal medications, fish oil, and all vitamins. ? ?         ?Do not wear jewelry or makeup ?Do not wear lotions, powders, perfumes/colognes, or deodorant. ?Do not shave 48 hours prior to surgery.  Men may shave face and neck. ?Do not bring valuables to the hospital. ?Do not wear nail polish, gel polish, artificial nails, or any other type of covering on natural nails (fingers and toes) ?If you have artificial nails or gel coating that need to be removed by a nail salon, please have this removed prior to surgery. Artificial nails or gel coating may interfere with anesthesia's ability to adequately monitor your vital signs. ? ?[place] [facility] is not responsible for any belongings or valuables. .  ? ?Do NOT Smoke (Tobacco/Vaping)  24 hours prior to your procedure ? ?If you use a CPAP at night, you may bring your mask for your overnight stay. ?  ?Contacts, glasses, hearing aids, dentures or partials may not be worn into surgery, please bring cases for these belongings ?  ?For patients admitted to the hospital, discharge time will be determined  by your treatment team. ?  ?Patients discharged the day of surgery will not be allowed to drive home, and someone needs to stay with them for 24 hours. ? ?NO VISITORS WILL BE ALLOWED IN PRE-OP WHERE PATIENTS ARE PREPPED FOR SURGERY.  ONLY 1 SUPPORT PERSON MAY BE PRESENT IN THE WAITING ROOM WHILE YOU ARE IN SURGERY.  IF YOU ARE TO BE ADMITTED, ONCE YOU ARE IN YOUR ROOM YOU WILL BE ALLOWED TWO (2) VISITORS. 1 (ONE) VISITOR MAY STAY OVERNIGHT BUT MUST ARRIVE TO THE ROOM BY 8pm.  Minor children may have two parents present. Special consideration for safety and communication needs will be reviewed on a case by case basis. ? ?Special instructions:   ? ?Oral Hygiene is also important to reduce your risk of infection.  Remember - BRUSH YOUR TEETH THE MORNING OF SURGERY WITH YOUR REGULAR TOOTHPASTE ? ? ?[place]- Preparing For Surgery ? ?Before surgery, you can play an important role. Because skin is not sterile, your skin needs to be as free of germs as possible. You can reduce the number of germs on your skin by washing with CHG (chlorahexidine gluconate) Soap before surgery.  CHG is an antiseptic cleaner which kills germs and bonds with the skin to continue killing germs even after washing.   ? ? ?Please do not use if you have an allergy to CHG or antibacterial soaps. If your skin becomes reddened/irritated stop using the CHG.  ?Do not shave (including legs and underarms) for at least 48 hours prior to  first CHG shower. It is OK to shave your face. ? ?Please follow these instructions carefully. ?  ? ? Shower the NIGHT BEFORE SURGERY and the MORNING OF SURGERY with CHG Soap.  ? If you chose to wash your hair, wash your hair first as usual with your normal shampoo. After you shampoo, rinse your hair and body thoroughly to remove the shampoo.  Then [employer] and genitals (private parts) with your normal soap and rinse thoroughly to remove soap. ? ?After that Use CHG Soap as you would any other liquid soap. You can apply  CHG directly to the skin and wash gently with a scrungie or a clean washcloth.  ? ?Apply the CHG Soap to your body ONLY FROM THE NECK DOWN.  Do not use on open wounds or open sores. Avoid contact with your eyes, ears, mouth and genitals (private parts). Wash Face and genitals (private parts)  with your normal soap.  ? ?Wash thoroughly, paying special attention to the area where your surgery will be performed. ? ?Thoroughly rinse your body with warm water from the neck down. ? ?DO NOT shower/wash with your normal soap after using and rinsing off the CHG Soap. ? ?Pat yourself dry with a CLEAN TOWEL. ? ?Wear CLEAN PAJAMAS to bed the night before surgery ? ?Place CLEAN SHEETS on your bed the night before your surgery ? ?DO NOT SLEEP WITH PETS. ? ? ?Day of Surgery: ? ?Take a shower with CHG soap. ?Wear Clean/Comfortable clothing the morning of surgery ?Do not apply any deodorants/lotions.   ?Remember to brush your teeth WITH YOUR REGULAR TOOTHPASTE. ? ? ? ?COVID testing ? ?If you are going to stay overnight or be admitted after your procedure/surgery and require a pre-op COVID test, please follow these instructions after your COVID test  ? ?You are not required to quarantine however you are required to wear a well-fitting mask when you are out and around people not in your household.  If your mask becomes wet or soiled, replace with a new one. ? ?Wash your hands often with soap and water for 20 seconds or clean your hands with an alcohol-based hand sanitizer that contains at least 60% alcohol. ? ?Do not share personal items. ? ?Notify your provider: ?if you are in close contact with someone who has COVID  ?or if you develop a fever of 100.4 or greater, sneezing, cough, sore throat, shortness of breath or body aches. ? ?  ?Please read over the following fact sheets that you were given.  ? ?

## 2021-05-13 ENCOUNTER — Encounter (HOSPITAL_COMMUNITY)
Admission: RE | Admit: 2021-05-13 | Discharge: 2021-05-13 | Disposition: A | Discharge status: Home / Self Care | Payer: BC Managed Care – PPO | Source: Ambulatory Visit | Attending: Neurosurgery | Admitting: Neurosurgery

## 2021-05-13 ENCOUNTER — Other Ambulatory Visit: Payer: Self-pay

## 2021-05-13 ENCOUNTER — Encounter (HOSPITAL_COMMUNITY): Payer: Self-pay

## 2021-05-13 VITALS — BP 123/76 | HR 55 | Temp 97.8°F | Resp 17 | Ht 72.0 in | Wt 211.4 lb

## 2021-05-13 DIAGNOSIS — Z01812 Encounter for preprocedural laboratory examination: Secondary | ICD-10-CM | POA: Insufficient documentation

## 2021-05-13 DIAGNOSIS — Z01818 Encounter for other preprocedural examination: Secondary | ICD-10-CM

## 2021-05-13 LAB — SURGICAL PCR SCREEN
MRSA, PCR: NEGATIVE
Staphylococcus aureus: NEGATIVE

## 2021-05-13 LAB — TYPE AND SCREEN
ABO/RH(D): B NEG
Antibody Screen: NEGATIVE

## 2021-05-13 NOTE — Progress Notes (Signed)
?   05/13/21 0813  ?OBSTRUCTIVE SLEEP APNEA  ?Have you ever been diagnosed with sleep apnea through a sleep study? No  ?Do you snore loudly (loud enough to be heard through closed doors)?  1  ?Do you often feel tired, fatigued, or sleepy during the daytime (such as falling asleep during driving or talking to someone)? 0  ?Has anyone observed you stop breathing during your sleep? 1  ?Do you have, or are you being treated for high blood pressure? 1  ?BMI more than 35 kg/m2? 0  ?Age > 2 (1-yes) 1  ?Neck circumference greater than:Male 16 inches or larger, Male 17inches or larger? 0  ?Male Gender (Yes=1) 1  ?Obstructive Sleep Apnea Score 5  ?Score 5 or greater  Results sent to PCP  ? ? ?

## 2021-05-13 NOTE — Pre-Procedure Instructions (Signed)
Surgical Instructions ? ? ? Your procedure is scheduled on Monday, March 20. ? Report to [facility] Main Entrance "A" at 12:20 A.M., then check in with the Admitting office. ? Call this number if you have problems the morning of surgery: ? [phone] ? ? If you have any questions prior to your surgery date call [phone]: Open Monday-Friday 8am-4pm ? ? ? Remember: ? Do not eat or drink after midnight the night before your surgery ? ?  ? Take these medicines the morning of surgery with A SIP OF WATER:  ? ?baclofen (LIORESAL) ?buPROPion (WELLBUTRIN XL)  ?dexamethasone (DECADRON) ?gabapentin (NEURONTIN) 100  ?levETIRAcetam (KEPPRA) ?acetaminophen (TYLENOL) if needed ? ?Follow your surgeon's instructions on stopping Rivaroxaban (Xarelto) ? ?As of today, STOP taking any Aspirin (unless otherwise instructed by your surgeon) Aleve, Naproxen, Ibuprofen, Motrin, Advil, Goody's, BC's, all herbal medications, fish oil, and all vitamins. ? ?         ?Do not wear jewelry or makeup ?Do not wear lotions, powders, perfumes/colognes, or deodorant. ?Do not shave 48 hours prior to surgery.  Men may shave face and neck. ?Do not bring valuables to the hospital. ?Do not wear nail polish, gel polish, artificial nails, or any other type of covering on natural nails (fingers and toes) ?If you have artificial nails or gel coating that need to be removed by a nail salon, please have this removed prior to surgery. Artificial nails or gel coating may interfere with anesthesia's ability to adequately monitor your vital signs. ? ?[facility] is not responsible for any belongings or valuables. .  ? ?Do NOT Smoke (Tobacco/Vaping)  24 hours prior to your procedure ? ?If you use a CPAP at night, you may bring your mask for your overnight stay. ?  ?Contacts, glasses, hearing aids, dentures or partials may not be worn into surgery, please bring cases for these belongings ?  ?For patients admitted to the hospital, discharge time will be determined  by your treatment team. ?  ?Patients discharged the day of surgery will not be allowed to drive home, and someone needs to stay with them for 24 hours. ? ?NO VISITORS WILL BE ALLOWED IN PRE-OP WHERE PATIENTS ARE PREPPED FOR SURGERY.  ONLY 1 SUPPORT PERSON MAY BE PRESENT IN THE WAITING ROOM WHILE YOU ARE IN SURGERY.  IF YOU ARE TO BE ADMITTED, ONCE YOU ARE IN YOUR ROOM YOU WILL BE ALLOWED TWO (2) VISITORS. 1 (ONE) VISITOR MAY STAY OVERNIGHT BUT MUST ARRIVE TO THE ROOM BY 8pm.  Minor children may have two parents present. Special consideration for safety and communication needs will be reviewed on a case by case basis. ? ?Special instructions:   ? ?Oral Hygiene is also important to reduce your risk of infection.  Remember - BRUSH YOUR TEETH THE MORNING OF SURGERY WITH YOUR REGULAR TOOTHPASTE ? ? ?[place]- Preparing For Surgery ? ?Before surgery, you can play an important role. Because skin is not sterile, your skin needs to be as free of germs as possible. You can reduce the number of germs on your skin by washing with CHG (chlorahexidine gluconate) Soap before surgery.  CHG is an antiseptic cleaner which kills germs and bonds with the skin to continue killing germs even after washing.   ? ? ?Please do not use if you have an allergy to CHG or antibacterial soaps. If your skin becomes reddened/irritated stop using the CHG.  ?Do not shave (including legs and underarms) for at least 48 hours prior to  first CHG shower. It is OK to shave your face. ? ?Please follow these instructions carefully. ?  ? ? Shower the NIGHT BEFORE SURGERY and the MORNING OF SURGERY with CHG Soap.  ? If you chose to wash your hair, wash your hair first as usual with your normal shampoo. After you shampoo, rinse your hair and body thoroughly to remove the shampoo.  Then [employer] and genitals (private parts) with your normal soap and rinse thoroughly to remove soap. ? ?After that Use CHG Soap as you would any other liquid soap. You can apply  CHG directly to the skin and wash gently with a scrungie or a clean washcloth.  ? ?Apply the CHG Soap to your body ONLY FROM THE NECK DOWN.  Do not use on open wounds or open sores. Avoid contact with your eyes, ears, mouth and genitals (private parts). Wash Face and genitals (private parts)  with your normal soap.  ? ?Wash thoroughly, paying special attention to the area where your surgery will be performed. ? ?Thoroughly rinse your body with warm water from the neck down. ? ?DO NOT shower/wash with your normal soap after using and rinsing off the CHG Soap. ? ?Pat yourself dry with a CLEAN TOWEL. ? ?Wear CLEAN PAJAMAS to bed the night before surgery ? ?Place CLEAN SHEETS on your bed the night before your surgery ? ?DO NOT SLEEP WITH PETS. ? ? ?Day of Surgery: ? ?Take a shower with CHG soap. ?Wear Clean/Comfortable clothing the morning of surgery ?Do not apply any deodorants/lotions.   ?Remember to brush your teeth WITH YOUR REGULAR TOOTHPASTE. ? ?

## 2021-05-13 NOTE — Progress Notes (Signed)
PCP - Dr. Gar Ponto ?Cardiologist - Dr. Cristopher Peru ? ?PPM/ICD - Denies ? ?Chest x-ray - N/A ?EKG - 05/08/21 ?Stress Test - 08/01/12 ?ECHO - 03/04/19 ?Cardiac Cath - 09/12/11 ? ?Sleep Study - Yes, not diagnosed with OSA ? ?OSA score 5 ? ?Patient denies having diabetes. ? ?Blood Thinner Instructions: LD Xarelto: 05/12/21 ?Aspirin Instructions: N/A ? ?ERAS Protcol - No ? ?COVID TEST- N/A ? ? ?Anesthesia review: Yes, cardiac hx ? ?Patient denies shortness of breath, fever, cough and chest pain at PAT appointment ? ? ?All instructions explained to the patient, with a verbal understanding of the material. Patient agrees to go over the instructions while at home for a better understanding. Patient also instructed to self quarantine after being tested for COVID-19. The opportunity to ask questions was provided. ? ? ?

## 2021-05-16 ENCOUNTER — Other Ambulatory Visit: Payer: Self-pay

## 2021-05-16 ENCOUNTER — Encounter (HOSPITAL_COMMUNITY): Payer: Self-pay | Admitting: Neurosurgery

## 2021-05-16 ENCOUNTER — Inpatient Hospital Stay (HOSPITAL_COMMUNITY): Payer: BC Managed Care – PPO | Admitting: Anesthesiology

## 2021-05-16 ENCOUNTER — Inpatient Hospital Stay (HOSPITAL_COMMUNITY)
Admission: RE | Admit: 2021-05-16 | Discharge: 2021-05-18 | DRG: 025 | Disposition: A | Discharge status: Home / Self Care | Payer: BC Managed Care – PPO | Attending: Neurosurgery | Admitting: Neurosurgery

## 2021-05-16 ENCOUNTER — Inpatient Hospital Stay (HOSPITAL_COMMUNITY)
Admission: RE | Disposition: A | Discharge status: Home / Self Care | Payer: Self-pay | Source: Home / Self Care | Attending: Neurosurgery

## 2021-05-16 DIAGNOSIS — G936 Cerebral edema: Secondary | ICD-10-CM | POA: Diagnosis present

## 2021-05-16 DIAGNOSIS — D496 Neoplasm of unspecified behavior of brain: Secondary | ICD-10-CM | POA: Diagnosis present

## 2021-05-16 DIAGNOSIS — F419 Anxiety disorder, unspecified: Secondary | ICD-10-CM | POA: Diagnosis present

## 2021-05-16 DIAGNOSIS — Z833 Family history of diabetes mellitus: Secondary | ICD-10-CM | POA: Diagnosis not present

## 2021-05-16 DIAGNOSIS — Z885 Allergy status to narcotic agent status: Secondary | ICD-10-CM | POA: Diagnosis not present

## 2021-05-16 DIAGNOSIS — C719 Malignant neoplasm of brain, unspecified: Principal | ICD-10-CM | POA: Diagnosis present

## 2021-05-16 DIAGNOSIS — Z888 Allergy status to other drugs, medicaments and biological substances status: Secondary | ICD-10-CM | POA: Diagnosis not present

## 2021-05-16 DIAGNOSIS — E0590 Thyrotoxicosis, unspecified without thyrotoxic crisis or storm: Secondary | ICD-10-CM | POA: Diagnosis present

## 2021-05-16 DIAGNOSIS — I428 Other cardiomyopathies: Secondary | ICD-10-CM | POA: Diagnosis present

## 2021-05-16 DIAGNOSIS — Z8249 Family history of ischemic heart disease and other diseases of the circulatory system: Secondary | ICD-10-CM | POA: Diagnosis not present

## 2021-05-16 DIAGNOSIS — Z7901 Long term (current) use of anticoagulants: Secondary | ICD-10-CM | POA: Diagnosis not present

## 2021-05-16 DIAGNOSIS — G4733 Obstructive sleep apnea (adult) (pediatric): Secondary | ICD-10-CM | POA: Diagnosis present

## 2021-05-16 DIAGNOSIS — I4891 Unspecified atrial fibrillation: Secondary | ICD-10-CM | POA: Diagnosis present

## 2021-05-16 DIAGNOSIS — G40109 Localization-related (focal) (partial) symptomatic epilepsy and epileptic syndromes with simple partial seizures, not intractable, without status epilepticus: Secondary | ICD-10-CM | POA: Diagnosis present

## 2021-05-16 DIAGNOSIS — F17210 Nicotine dependence, cigarettes, uncomplicated: Secondary | ICD-10-CM | POA: Diagnosis present

## 2021-05-16 DIAGNOSIS — Z808 Family history of malignant neoplasm of other organs or systems: Secondary | ICD-10-CM | POA: Diagnosis not present

## 2021-05-16 DIAGNOSIS — Z79899 Other long term (current) drug therapy: Secondary | ICD-10-CM

## 2021-05-16 DIAGNOSIS — R519 Headache, unspecified: Secondary | ICD-10-CM | POA: Diagnosis not present

## 2021-05-16 HISTORY — PX: CRANIOTOMY: SHX93

## 2021-05-16 HISTORY — PX: APPLICATION OF CRANIAL NAVIGATION: SHX6578

## 2021-05-16 LAB — POCT I-STAT 7, (LYTES, BLD GAS, ICA,H+H)
Acid-base deficit: 4 mmol/L — ABNORMAL HIGH (ref 0.0–2.0)
Bicarbonate: 23.7 mmol/L (ref 20.0–28.0)
Calcium, Ion: 1.23 mmol/L (ref 1.15–1.40)
HCT: 41 % (ref 39.0–52.0)
Hemoglobin: 13.9 g/dL (ref 13.0–17.0)
O2 Saturation: 97 %
Patient temperature: 35.3
Potassium: 4.8 mmol/L (ref 3.5–5.1)
Sodium: 134 mmol/L — ABNORMAL LOW (ref 135–145)
TCO2: 25 mmol/L (ref 22–32)
pCO2 arterial: 47.1 mmHg (ref 32–48)
pH, Arterial: 7.3 — ABNORMAL LOW (ref 7.35–7.45)
pO2, Arterial: 93 mmHg (ref 83–108)

## 2021-05-16 LAB — ABO/RH: ABO/RH(D): B NEG

## 2021-05-16 SURGERY — CRANIOTOMY TUMOR EXCISION
Anesthesia: General | Laterality: Right

## 2021-05-16 MED ORDER — LEVETIRACETAM IN NACL 500 MG/100ML IV SOLN
500.0000 mg | Freq: Once | INTRAVENOUS | Status: DC
  Filled 2021-05-16: qty 100

## 2021-05-16 MED ORDER — BACITRACIN ZINC 500 UNIT/GM EX OINT
TOPICAL_OINTMENT | CUTANEOUS | Status: DC | PRN
  Administered 2021-05-16 (×2): 1 via TOPICAL

## 2021-05-16 MED ORDER — ROCURONIUM BROMIDE 10 MG/ML (PF) SYRINGE
PREFILLED_SYRINGE | INTRAVENOUS | Status: AC
  Filled 2021-05-16: qty 10

## 2021-05-16 MED ORDER — PANTOPRAZOLE SODIUM 40 MG IV SOLR
40.0000 mg | Freq: Every day | INTRAVENOUS | Status: DC
  Administered 2021-05-16: 40 mg via INTRAVENOUS
  Filled 2021-05-16: qty 10

## 2021-05-16 MED ORDER — BISACODYL 10 MG RE SUPP
10.0000 mg | Freq: Every day | RECTAL | Status: DC | PRN
Start: 2021-05-16 — End: 2021-05-18

## 2021-05-16 MED ORDER — HYDROCODONE-ACETAMINOPHEN 5-325 MG PO TABS
1.0000 | ORAL_TABLET | ORAL | Status: DC | PRN
  Administered 2021-05-17 – 2021-05-18 (×6): 1 via ORAL
  Filled 2021-05-16 (×8): qty 1

## 2021-05-16 MED ORDER — MANNITOL 25 % IV SOLN
INTRAVENOUS | Status: DC | PRN
  Administered 2021-05-16: 60 g via INTRAVENOUS

## 2021-05-16 MED ORDER — DEXAMETHASONE SODIUM PHOSPHATE 10 MG/ML IJ SOLN
INTRAMUSCULAR | Status: AC
  Filled 2021-05-16: qty 1

## 2021-05-16 MED ORDER — ONDANSETRON HCL 4 MG/2ML IJ SOLN
INTRAMUSCULAR | Status: DC | PRN
  Administered 2021-05-16: 4 mg via INTRAVENOUS

## 2021-05-16 MED ORDER — MIDAZOLAM HCL 2 MG/2ML IJ SOLN
1.0000 mg | Freq: Once | INTRAMUSCULAR | Status: AC
  Administered 2021-05-16: 1 mg via INTRAVENOUS

## 2021-05-16 MED ORDER — ROCURONIUM BROMIDE 10 MG/ML (PF) SYRINGE
PREFILLED_SYRINGE | INTRAVENOUS | Status: DC | PRN
  Administered 2021-05-16: 70 mg via INTRAVENOUS
  Administered 2021-05-16: 10 mg via INTRAVENOUS
  Administered 2021-05-16: 20 mg via INTRAVENOUS

## 2021-05-16 MED ORDER — CLEVIDIPINE BUTYRATE 0.5 MG/ML IV EMUL
0.0000 mg/h | Freq: Once | INTRAVENOUS | Status: AC
  Administered 2021-05-16: 2 mg/h via INTRAVENOUS
  Filled 2021-05-16: qty 50

## 2021-05-16 MED ORDER — FENTANYL CITRATE (PF) 250 MCG/5ML IJ SOLN
INTRAMUSCULAR | Status: DC | PRN
  Administered 2021-05-16: 150 ug via INTRAVENOUS

## 2021-05-16 MED ORDER — THROMBIN 5000 UNITS EX SOLR
CUTANEOUS | Status: AC
  Filled 2021-05-16: qty 5000

## 2021-05-16 MED ORDER — LIDOCAINE-EPINEPHRINE 1 %-1:100000 IJ SOLN
INTRAMUSCULAR | Status: AC
  Filled 2021-05-16: qty 1

## 2021-05-16 MED ORDER — CHLORHEXIDINE GLUCONATE CLOTH 2 % EX PADS: 6.0000 | MEDICATED_PAD | Freq: Once | CUTANEOUS | Status: DC

## 2021-05-16 MED ORDER — DEXAMETHASONE SODIUM PHOSPHATE 4 MG/ML IJ SOLN: 4.0000 mg | Freq: Three times a day (TID) | INTRAMUSCULAR | Status: DC

## 2021-05-16 MED ORDER — DEXAMETHASONE SODIUM PHOSPHATE 10 MG/ML IJ SOLN
INTRAMUSCULAR | Status: DC | PRN
  Administered 2021-05-16: 10 mg via INTRAVENOUS

## 2021-05-16 MED ORDER — 0.9 % SODIUM CHLORIDE (POUR BTL) OPTIME
TOPICAL | Status: DC | PRN
  Administered 2021-05-16: 1000 mL
  Administered 2021-05-16: 2000 mL

## 2021-05-16 MED ORDER — ONDANSETRON HCL 4 MG/2ML IJ SOLN
INTRAMUSCULAR | Status: AC
  Filled 2021-05-16: qty 2

## 2021-05-16 MED ORDER — PROMETHAZINE HCL 25 MG PO TABS
12.5000 mg | ORAL_TABLET | ORAL | Status: DC | PRN
  Filled 2021-05-16: qty 1

## 2021-05-16 MED ORDER — PROPOFOL 10 MG/ML IV BOLUS
INTRAVENOUS | Status: AC
  Filled 2021-05-16: qty 20

## 2021-05-16 MED ORDER — LEVETIRACETAM IN NACL 500 MG/100ML IV SOLN
500.0000 mg | Freq: Once | INTRAVENOUS | Status: AC
  Administered 2021-05-16: 500 mg via INTRAVENOUS
  Filled 2021-05-16: qty 100

## 2021-05-16 MED ORDER — KETOROLAC TROMETHAMINE 30 MG/ML IJ SOLN
INTRAMUSCULAR | Status: AC
  Filled 2021-05-16: qty 1

## 2021-05-16 MED ORDER — ACETAMINOPHEN 325 MG PO TABS: 650.0000 mg | ORAL_TABLET | ORAL | Status: DC | PRN

## 2021-05-16 MED ORDER — CEFAZOLIN SODIUM-DEXTROSE 2-4 GM/100ML-% IV SOLN
2.0000 g | INTRAVENOUS | Status: AC
  Administered 2021-05-16: 2 g via INTRAVENOUS

## 2021-05-16 MED ORDER — SODIUM CHLORIDE 0.9 % IV SOLN
INTRAVENOUS | Status: DC
Start: 2021-05-16 — End: 2021-05-16

## 2021-05-16 MED ORDER — ORAL CARE MOUTH RINSE: 15.0000 mL | Freq: Once | OROMUCOSAL | Status: AC

## 2021-05-16 MED ORDER — BACITRACIN ZINC 500 UNIT/GM EX OINT
TOPICAL_OINTMENT | CUTANEOUS | Status: AC
  Filled 2021-05-16: qty 28.35

## 2021-05-16 MED ORDER — THROMBIN 20000 UNITS EX SOLR
CUTANEOUS | Status: AC
  Filled 2021-05-16: qty 20000

## 2021-05-16 MED ORDER — PROPOFOL 10 MG/ML IV BOLUS
INTRAVENOUS | Status: DC | PRN
  Administered 2021-05-16: 30 mg via INTRAVENOUS
  Administered 2021-05-16: 200 mg via INTRAVENOUS

## 2021-05-16 MED ORDER — ONDANSETRON HCL 4 MG/2ML IJ SOLN: 4.0000 mg | INTRAMUSCULAR | Status: DC | PRN

## 2021-05-16 MED ORDER — GABAPENTIN 100 MG PO CAPS
100.0000 mg | ORAL_CAPSULE | Freq: Three times a day (TID) | ORAL | Status: DC
  Administered 2021-05-16 – 2021-05-18 (×5): 100 mg via ORAL
  Filled 2021-05-16 (×5): qty 1

## 2021-05-16 MED ORDER — POLYETHYLENE GLYCOL 3350 17 G PO PACK: 17.0000 g | PACK | Freq: Every day | ORAL | Status: DC | PRN

## 2021-05-16 MED ORDER — FENTANYL CITRATE (PF) 100 MCG/2ML IJ SOLN
25.0000 ug | INTRAMUSCULAR | Status: DC | PRN
  Administered 2021-05-16: 50 ug via INTRAVENOUS

## 2021-05-16 MED ORDER — HEMOSTATIC AGENTS (NO CHARGE) OPTIME
TOPICAL | Status: DC | PRN
  Administered 2021-05-16: 1 via TOPICAL

## 2021-05-16 MED ORDER — BUPROPION HCL ER (XL) 150 MG PO TB24
150.0000 mg | ORAL_TABLET | Freq: Every day | ORAL | Status: DC
  Filled 2021-05-16 (×2): qty 1

## 2021-05-16 MED ORDER — DEXAMETHASONE SODIUM PHOSPHATE 4 MG/ML IJ SOLN
4.0000 mg | Freq: Four times a day (QID) | INTRAMUSCULAR | Status: AC
  Administered 2021-05-17 – 2021-05-18 (×4): 4 mg via INTRAVENOUS
  Filled 2021-05-16 (×4): qty 1

## 2021-05-16 MED ORDER — HYDROMORPHONE HCL 1 MG/ML IJ SOLN
0.5000 mg | INTRAMUSCULAR | Status: DC | PRN
  Administered 2021-05-16: 1 mg via INTRAVENOUS
  Administered 2021-05-17: 0.5 mg via INTRAVENOUS
  Administered 2021-05-17: 1 mg via INTRAVENOUS
  Filled 2021-05-16 (×3): qty 1

## 2021-05-16 MED ORDER — EPHEDRINE 5 MG/ML INJ
INTRAVENOUS | Status: AC
  Filled 2021-05-16: qty 5

## 2021-05-16 MED ORDER — LEVETIRACETAM IN NACL 1000 MG/100ML IV SOLN
1000.0000 mg | Freq: Two times a day (BID) | INTRAVENOUS | Status: DC
  Administered 2021-05-17 – 2021-05-18 (×3): 1000 mg via INTRAVENOUS
  Filled 2021-05-16 (×3): qty 100

## 2021-05-16 MED ORDER — LIDOCAINE-EPINEPHRINE 1 %-1:100000 IJ SOLN
INTRAMUSCULAR | Status: DC | PRN
  Administered 2021-05-16: 20 mL

## 2021-05-16 MED ORDER — LACTATED RINGERS IV SOLN: INTRAVENOUS | Status: DC

## 2021-05-16 MED ORDER — DEXAMETHASONE SODIUM PHOSPHATE 10 MG/ML IJ SOLN
6.0000 mg | Freq: Four times a day (QID) | INTRAMUSCULAR | Status: AC
  Administered 2021-05-16 – 2021-05-17 (×4): 6 mg via INTRAVENOUS
  Filled 2021-05-16 (×4): qty 1

## 2021-05-16 MED ORDER — FENTANYL CITRATE (PF) 100 MCG/2ML IJ SOLN
INTRAMUSCULAR | Status: AC
  Filled 2021-05-16: qty 2

## 2021-05-16 MED ORDER — FENTANYL CITRATE (PF) 250 MCG/5ML IJ SOLN
INTRAMUSCULAR | Status: AC
  Filled 2021-05-16: qty 5

## 2021-05-16 MED ORDER — GELATIN ABSORBABLE MT POWD
OROMUCOSAL | Status: DC | PRN
  Administered 2021-05-16 (×3): 5 mL via TOPICAL

## 2021-05-16 MED ORDER — LIDOCAINE 2% (20 MG/ML) 5 ML SYRINGE
INTRAMUSCULAR | Status: AC
  Filled 2021-05-16: qty 5

## 2021-05-16 MED ORDER — LABETALOL HCL 5 MG/ML IV SOLN: 10.0000 mg | INTRAVENOUS | Status: DC | PRN

## 2021-05-16 MED ORDER — CEFAZOLIN SODIUM-DEXTROSE 2-4 GM/100ML-% IV SOLN
2.0000 g | Freq: Three times a day (TID) | INTRAVENOUS | Status: AC
  Administered 2021-05-16 – 2021-05-17 (×2): 2 g via INTRAVENOUS
  Filled 2021-05-16 (×2): qty 100

## 2021-05-16 MED ORDER — CHLORHEXIDINE GLUCONATE 0.12 % MT SOLN: 15.0000 mL | Freq: Once | OROMUCOSAL | Status: AC

## 2021-05-16 MED ORDER — THROMBIN 20000 UNITS EX SOLR
CUTANEOUS | Status: DC | PRN
  Administered 2021-05-16: 20 mL via TOPICAL

## 2021-05-16 MED ORDER — LOSARTAN POTASSIUM 50 MG PO TABS
50.0000 mg | ORAL_TABLET | Freq: Every day | ORAL | Status: DC
  Administered 2021-05-17 – 2021-05-18 (×2): 50 mg via ORAL
  Filled 2021-05-16 (×3): qty 1

## 2021-05-16 MED ORDER — PHENYLEPHRINE HCL-NACL 20-0.9 MG/250ML-% IV SOLN
INTRAVENOUS | Status: DC | PRN
  Administered 2021-05-16: 25 ug/min via INTRAVENOUS

## 2021-05-16 MED ORDER — ACETAMINOPHEN 650 MG RE SUPP: 650.0000 mg | RECTAL | Status: DC | PRN

## 2021-05-16 MED ORDER — MIDAZOLAM HCL 2 MG/2ML IJ SOLN
INTRAMUSCULAR | Status: AC
  Filled 2021-05-16: qty 2

## 2021-05-16 MED ORDER — SUGAMMADEX SODIUM 200 MG/2ML IV SOLN
INTRAVENOUS | Status: DC | PRN
  Administered 2021-05-16: 200 mg via INTRAVENOUS

## 2021-05-16 MED ORDER — CHLORHEXIDINE GLUCONATE 0.12 % MT SOLN
OROMUCOSAL | Status: AC
  Administered 2021-05-16: 15 mL via OROMUCOSAL
  Filled 2021-05-16: qty 15

## 2021-05-16 MED ORDER — SODIUM CHLORIDE 0.9 % IV SOLN: INTRAVENOUS | Status: DC

## 2021-05-16 MED ORDER — CEFAZOLIN SODIUM-DEXTROSE 2-4 GM/100ML-% IV SOLN
INTRAVENOUS | Status: AC
  Filled 2021-05-16: qty 100

## 2021-05-16 MED ORDER — ONDANSETRON HCL 4 MG PO TABS: 4.0000 mg | ORAL_TABLET | ORAL | Status: DC | PRN

## 2021-05-16 MED ORDER — ACETAMINOPHEN 10 MG/ML IV SOLN
INTRAVENOUS | Status: AC
Start: 2021-05-16 — End: 2021-05-17
  Filled 2021-05-16: qty 100

## 2021-05-16 MED ORDER — ESMOLOL HCL 100 MG/10ML IV SOLN
INTRAVENOUS | Status: AC
  Filled 2021-05-16: qty 10

## 2021-05-16 MED ORDER — LIDOCAINE 2% (20 MG/ML) 5 ML SYRINGE
INTRAMUSCULAR | Status: DC | PRN
  Administered 2021-05-16: 80 mg via INTRAVENOUS

## 2021-05-16 MED ORDER — ACETAMINOPHEN 10 MG/ML IV SOLN
1000.0000 mg | Freq: Once | INTRAVENOUS | Status: DC | PRN
  Administered 2021-05-16: 1000 mg via INTRAVENOUS

## 2021-05-16 MED ORDER — SODIUM CHLORIDE 0.9 % IV SOLN
0.0500 ug/kg/min | INTRAVENOUS | Status: DC
  Administered 2021-05-16: .05 ug/kg/min via INTRAVENOUS
  Filled 2021-05-16: qty 5000

## 2021-05-16 SURGICAL SUPPLY — 70 items
BAG COUNTER SPONGE SURGICOUNT (BAG) ×3 IMPLANT
BAG DECANTER FOR FLEXI CONT (MISCELLANEOUS) ×1 IMPLANT
BAG SPNG CNTER NS LX DISP (BAG) ×2
BAND INSRT 18 STRL LF DISP RB (MISCELLANEOUS) ×2
BAND RUBBER #18 3X1/16 STRL (MISCELLANEOUS) ×2 IMPLANT
BLADE CLIPPER SURG (BLADE) ×2 IMPLANT
BNDG COHESIVE 4X5 TAN STRL (GAUZE/BANDAGES/DRESSINGS) ×2 IMPLANT
BUR ACORN 6.0 PRECISION (BURR) ×2 IMPLANT
BUR SPIRAL ROUTER 2.3 (BUR) ×1 IMPLANT
CANISTER SUCT 3000ML PPV (MISCELLANEOUS) ×4 IMPLANT
CARTRIDGE OIL MAESTRO DRILL (MISCELLANEOUS) ×1 IMPLANT
CLIP VESOCCLUDE MED 6/CT (CLIP) ×2 IMPLANT
CNTNR URN SCR LID CUP LEK RST (MISCELLANEOUS) ×1 IMPLANT
CONT SPEC 4OZ STRL OR WHT (MISCELLANEOUS) ×2
COVER BURR HOLE 14 (Orthopedic Implant) ×2 IMPLANT
COVER BURR HOLE 7 (Orthopedic Implant) ×1 IMPLANT
COVER BURR HOLE UNIV 10 (Orthopedic Implant) ×1 IMPLANT
DIFFUSER DRILL AIR PNEUMATIC (MISCELLANEOUS) ×2 IMPLANT
DRAIN CHANNEL 10M FLAT 3/4 FLT (DRAIN) IMPLANT
DRAPE CAMERA VIDEO/LASER (DRAPES) IMPLANT
DRAPE MICROSCOPE LEICA (MISCELLANEOUS) ×2 IMPLANT
DRAPE NEUROLOGICAL W/INCISE (DRAPES) ×2 IMPLANT
DRAPE STERI IOBAN 125X83 (DRAPES) IMPLANT
DRAPE SURG 17X23 STRL (DRAPES) IMPLANT
DRAPE WARM FLUID 44X44 (DRAPES) ×2 IMPLANT
ELECT CAUTERY BLADE 6.4 (BLADE) ×1 IMPLANT
ELECT REM PT RETURN 9FT ADLT (ELECTROSURGICAL) ×2
ELECTRODE REM PT RTRN 9FT ADLT (ELECTROSURGICAL) ×1 IMPLANT
EVACUATOR SILICONE 100CC (DRAIN) IMPLANT
FORCEPS BIPOLAR SPETZLER 8 1.0 (NEUROSURGERY SUPPLIES) ×1 IMPLANT
GAUZE 4X4 16PLY ~~LOC~~+RFID DBL (SPONGE) ×2 IMPLANT
GAUZE SPONGE 4X4 12PLY STRL (GAUZE/BANDAGES/DRESSINGS) ×2 IMPLANT
GLOVE EXAM NITRILE XL STR (GLOVE) IMPLANT
GLOVE SURG LTX SZ9 (GLOVE) ×2 IMPLANT
GOWN STRL REUS W/ TWL LRG LVL3 (GOWN DISPOSABLE) IMPLANT
GOWN STRL REUS W/ TWL XL LVL3 (GOWN DISPOSABLE) IMPLANT
GOWN STRL REUS W/TWL 2XL LVL3 (GOWN DISPOSABLE) IMPLANT
GOWN STRL REUS W/TWL LRG LVL3 (GOWN DISPOSABLE)
GOWN STRL REUS W/TWL XL LVL3 (GOWN DISPOSABLE)
HEMOSTAT POWDER KIT SURGIFOAM (HEMOSTASIS) ×3 IMPLANT
HEMOSTAT SURGICEL 2X14 (HEMOSTASIS) ×2 IMPLANT
KIT BASIN OR (CUSTOM PROCEDURE TRAY) ×2 IMPLANT
KIT TURNOVER KIT B (KITS) ×2 IMPLANT
MARKER SPHERE PSV REFLC 13MM (MARKER) ×4 IMPLANT
NDL HYPO 18GX1.5 BLUNT FILL (NEEDLE) IMPLANT
NDL HYPO 25X1 1.5 SAFETY (NEEDLE) ×1 IMPLANT
NEEDLE HYPO 18GX1.5 BLUNT FILL (NEEDLE) IMPLANT
NEEDLE HYPO 25X1 1.5 SAFETY (NEEDLE) ×2 IMPLANT
NS IRRIG 1000ML POUR BTL (IV SOLUTION) ×4 IMPLANT
OIL CARTRIDGE MAESTRO DRILL (MISCELLANEOUS) ×2
PACK CRANIOTOMY CUSTOM (CUSTOM PROCEDURE TRAY) ×2 IMPLANT
PAD ARMBOARD 7.5X6 YLW CONV (MISCELLANEOUS) ×6 IMPLANT
PATTIES SURGICAL .25X.25 (GAUZE/BANDAGES/DRESSINGS) IMPLANT
PATTIES SURGICAL .5 X.5 (GAUZE/BANDAGES/DRESSINGS) IMPLANT
PATTIES SURGICAL .5 X3 (DISPOSABLE) IMPLANT
PATTIES SURGICAL 1X1 (DISPOSABLE) ×1 IMPLANT
PLATE BONE 12 2H TARGET XL (Plate) ×1 IMPLANT
SCREW UNIII AXS SD 1.5X4 (Screw) ×18 IMPLANT
SPONGE NEURO XRAY DETECT 1X3 (DISPOSABLE) ×1 IMPLANT
SPONGE SURGIFOAM ABS GEL 100 (HEMOSTASIS) ×3 IMPLANT
STAPLER VISISTAT 35W (STAPLE) ×2 IMPLANT
SUT NURALON 4 0 TR CR/8 (SUTURE) ×5 IMPLANT
SUT VIC AB 2-0 CT2 18 VCP726D (SUTURE) ×4 IMPLANT
SYR CONTROL 10ML LL (SYRINGE) ×2 IMPLANT
TAPE CLOTH SURG 4X10 WHT LF (GAUZE/BANDAGES/DRESSINGS) ×1 IMPLANT
TOWEL GREEN STERILE (TOWEL DISPOSABLE) ×2 IMPLANT
TOWEL GREEN STERILE FF (TOWEL DISPOSABLE) ×2 IMPLANT
TRAY FOLEY MTR SLVR 16FR STAT (SET/KITS/TRAYS/PACK) ×2 IMPLANT
UNDERPAD 30X36 HEAVY ABSORB (UNDERPADS AND DIAPERS) ×2 IMPLANT
WATER STERILE IRR 1000ML POUR (IV SOLUTION) ×2 IMPLANT

## 2021-05-16 NOTE — H&P (Signed)
?Jerry Richards is an 51 y.o. male.   ?Chief Complaint: Weakness ?HPI: 51 year old male with left lower extremity weakness spasms and some degree of simple partial epilepsy.  Work-up demonstrates evidence of a tumor involving his parasagittal region on the right with compression of his right paracentral lobule.  Imaging most consistent with extra-axial tumor although this is not definitive.  Patient with no history of other malignancy.  He presents now for surgical resection. ? ?Past Medical History:  ?Diagnosis Date  ? Anxiety   ? Atrial fibrillation ([place])   ? a. s/p RFCA x 2 (10/26/2011, 04/15/2012)  ? Atrial flutter ([place])   ? Chronic kidney disease   ? Clotting disorder ([place])   ? Colitis   ? Current smoker   ? Diverticulitis   ? 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 LA thrombus   ? Non-ischemic cardiomyopathy ([place])   ? OSA on CPAP   ? Sinus trouble   ? Sleep apnea   ? Systolic heart failure ([place])   ? EF 20-25%-> 50-55% in 10/2011. MOST RECENT ECHO (03/2012) EF= 66%  ? ? ?Past Surgical History:  ?Procedure Laterality Date  ? ATRIAL ABLATION SURGERY    ? CARDIAC CATHETERIZATION  08/2011  ? [facility]  ? CHOLECYSTECTOMY    ? FINGER SURGERY    ? LEFT HEART CATHETERIZATION WITH CORONARY ANGIOGRAM N/A 09/12/2011  ? Procedure: LEFT HEART CATHETERIZATION WITH CORONARY ANGIOGRAM;  Surgeon: Hillary Bow, MD;  Location: [facility] CATH LAB;  Service: Cardiovascular;  Laterality: N/A;  ? VASECTOMY    ? ? ?Family History  ?Problem Relation Age of Onset  ? Healthy Mother   ?     age 56  ? Cancer Mother   ?     melanoma  ? Atrial fibrillation Father   ?     age 27  ? Cancer Father   ?     melanoma  ? Atrial fibrillation Paternal Aunt   ? Atrial fibrillation Paternal Uncle   ? Diabetes Maternal Grandmother   ? Coronary artery disease Paternal Grandmother   ? Heart disease Paternal Grandmother   ? Heart attack Paternal Grandmother   ? Coronary artery disease Paternal Grandfather   ? Heart disease  Paternal Grandfather   ? Heart attack Paternal Grandfather   ? Colon cancer Neg Hx   ? Esophageal cancer Neg Hx   ? Ovarian cancer Neg Hx   ? Stomach cancer Neg Hx   ? Rectal cancer Neg Hx   ? ?Social History:  reports that he has been smoking cigarettes. He has a 20.00 pack-year smoking history. He has never used smokeless tobacco. He reports current alcohol use. He reports that he does not use drugs. ? ?Allergies:  ?Allergies  ?Allergen Reactions  ? Amiodarone   ?  AMIODARONE ANALOGUES ?Hyperthyroidism  ? Morphine And Related Palpitations  ?  Chest pressure/palpitations  ? ? ?Medications Prior to Admission  ?Medication Sig Dispense Refill  ? acetaminophen (TYLENOL) 500 MG tablet Take 1,000 mg by mouth every 6 (six) hours as needed for moderate pain.    ? baclofen (LIORESAL) 10 MG tablet Take 1 tablet (10 mg total) by mouth 3 (three) times daily. 90 each 0  ? dexamethasone (DECADRON) 4 MG tablet Take 1 tablet (4 mg total) by mouth 2 (two) times daily with a meal. 30 tablet 0  ? gabapentin (NEURONTIN) 100 MG capsule Take 100 mg by mouth 3 (three) times  daily.    ? levETIRAcetam (KEPPRA) 500 MG tablet Take 500 mg by mouth 2 (two) times daily.    ? losartan (COZAAR) 50 MG tablet Take 50 mg by mouth daily.  6  ? rivaroxaban (XARELTO) 20 MG TABS tablet Take 1 tablet (20 mg total) by mouth daily. 90 tablet 0  ? buPROPion (WELLBUTRIN XL) 150 MG 24 hr tablet Take 150 mg by mouth daily.    ? ? ?Results for orders placed or performed during the hospital encounter of 05/16/21 (from the past 48 hour(s))  ?ABO/Rh     Status: None (Preliminary result)  ? Collection Time: 05/16/21 12:45 PM  ?Result Value Ref Range  ? ABO/RH(D) PENDING   ? ?No results found. ? ?Pertinent items noted in HPI and remainder of comprehensive ROS otherwise negative. ? ?Blood pressure 127/82, pulse (!) 53, temperature 98.1 ?F (36.7 ?C), temperature source Oral, resp. rate 18, height 6' (1.829 m), weight 95.9 kg, SpO2 100 %. ? ?Patient is awake and alert.   He is oriented and appropriate.  Speech is fluent.  Judgment insight are intact.  Cranial nerve function normal lateral.  Motor examination patient has significant weakness in his left lower extremity with 0/5 left dorsiflexion and 0/5 left plantarflexion.  He has 4-/5 left quadriceps and hip flexion.  Sensory examination nonfocal.  Examination head ears eyes nose and throat is unremarkable chest and abdomen are benign.  Extremities are free from injury or deformity. 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  Plan biparietal craniotomy and resection of tumor using intraoperative stereotactic guidance.  Risk and benefits of been explained.  Patient wishes to proceed. ? ?Mallie Mussel A Dontee Jaso ?05/16/2021, 1:24 PM ? ? ? ?

## 2021-05-16 NOTE — Brief Op Note (Signed)
05/16/2021 ? ?4:21 PM ? ?PATIENT:  Jerry Richards  51 y.o. male ? ?PRE-OPERATIVE DIAGNOSIS:  Brain tumor ? ?POST-OPERATIVE DIAGNOSIS:  Brain tumor ? ?PROCEDURE:  Procedure(s): ?Craniotomy - right - Parietal with  brainlab (Right) ?APPLICATION OF CRANIAL NAVIGATION (Right) ? ?SURGEON:  Surgeon(s) and Role: ?   Earnie Larsson, MD - Primary ? ?PHYSICIAN ASSISTANT:  ? ?ASSISTANTS: Bergman,NP  ? ?ANESTHESIA:   general ? ?EBL:  600 mL  ? ?BLOOD ADMINISTERED:none ? ?DRAINS: none  ? ?LOCAL MEDICATIONS USED:  LIDOCAINE  ? ?SPECIMEN:  Source of Specimen:  right posterior parasagittal region ? ?DISPOSITION OF SPECIMEN:  PATHOLOGY ? ?COUNTS:  YES ? ?TOURNIQUET:  * No tourniquets in log * ? ?DICTATION: .Dragon Dictation ? ?PLAN OF CARE: Admit to inpatient  ? ?PATIENT DISPOSITION:  PACU - hemodynamically stable. ?  ?Delay start of Pharmacological VTE agent (>24hrs) due to surgical blood loss or risk of bleeding: yes ? ?

## 2021-05-16 NOTE — Anesthesia Procedure Notes (Signed)
Procedure Name: Intubation ?Date/Time: 05/16/2021 2:25 PM ?Performed by: Jenne Campus, CRNA ?Pre-anesthesia Checklist: Patient identified, Emergency Drugs available, Suction available and Patient being monitored ?Patient Re-evaluated:Patient Re-evaluated prior to induction ?Oxygen Delivery Method: Circle System Utilized ?Preoxygenation: Pre-oxygenation with 100% oxygen ?Induction Type: IV induction ?Ventilation: Mask ventilation without difficulty and Oral airway inserted - appropriate to patient size ?Laryngoscope Size: Sabra Heck and 3 ?Grade View: Grade I ?Tube type: Oral ?Tube size: 7.5 mm ?Number of attempts: 1 ?Airway Equipment and Method: Stylet and Oral airway ?Placement Confirmation: ETT inserted through vocal cords under direct vision, positive ETCO2 and breath sounds checked- equal and bilateral ?Secured at: 23 cm ?Tube secured with: Tape ?Dental Injury: Teeth and Oropharynx as per pre-operative assessment  ? ? ? ? ?

## 2021-05-16 NOTE — Anesthesia Postprocedure Evaluation (Signed)
Anesthesia Post Note ? ?Patient: SLATER MCMANAMAN ? ?Procedure(s) Performed: Craniotomy - right - Parietal with  brainlab (Right) ?APPLICATION OF CRANIAL NAVIGATION (Right) ? ?  ? ?Patient location during evaluation: PACU ?Anesthesia Type: General ?Level of consciousness: awake and alert ?Pain management: pain level controlled ?Vital Signs Assessment: post-procedure vital signs reviewed and stable ?Respiratory status: spontaneous breathing, nonlabored ventilation, respiratory function stable and patient connected to nasal cannula oxygen ?Cardiovascular status: blood pressure returned to baseline and stable ?Postop Assessment: no apparent nausea or vomiting ?Anesthetic complications: no ? ? ?No notable events documented. ? ?Last Vitals:  ?Vitals:  ? 05/16/21 1843 05/16/21 1900  ?BP: 114/70 109/75  ?Pulse: 67 62  ?Resp: 18 16  ?Temp: 36.5 ?C   ?SpO2: 96% 97%  ?  ?Last Pain:  ?Vitals:  ? 05/16/21 1843  ?TempSrc: Oral  ?PainSc: 6   ? ? ?  ?  ?  ?  ?  ?  ? ?March Rummage Daniele Yankowski ? ? ? ? ?

## 2021-05-16 NOTE — Anesthesia Procedure Notes (Signed)
Arterial Line Insertion ?Start/End3/20/2023 12:45 PM, 05/16/2021 1:00 PM ?Performed by: Janace Litten, CRNA, CRNA ? Patient location: Pre-op. ?Preanesthetic checklist: patient identified, IV checked, site marked, risks and benefits discussed, surgical consent, monitors and equipment checked, pre-op evaluation, timeout performed and anesthesia consent ?Emergency situation ?Lidocaine 1% used for infiltration ?Left, radial was placed ?Catheter size: 20 G ?Hand hygiene performed  and maximum sterile barriers used  ? ?Attempts: 1 ?Procedure performed without using ultrasound guided technique. ?Following insertion, dressing applied and Biopatch. ?Patient tolerated the procedure well with no immediate complications. ? ? ?

## 2021-05-16 NOTE — Transfer of Care (Signed)
Immediate Anesthesia Transfer of Care Note ? ?Patient: Jerry Richards ? ?Procedure(s) Performed: Craniotomy - right - Parietal with  brainlab (Right) ?APPLICATION OF CRANIAL NAVIGATION (Right) ? ?Patient Location: PACU ? ?Anesthesia Type:General ? ?Level of Consciousness: awake, oriented and patient cooperative ? ?Airway & Oxygen Therapy: Patient Spontanous Breathing and Patient connected to nasal cannula oxygen ? ?Post-op Assessment: Report given to RN and Post -op Vital signs reviewed and stable ? ?Post vital signs: Reviewed ? ?Last Vitals:  ?Vitals Value Taken Time  ?BP 137/74 05/16/21 1703  ?Temp    ?Pulse 63 05/16/21 1712  ?Resp 19 05/16/21 1712  ?SpO2 100 % 05/16/21 1712  ?Vitals shown include unvalidated device data. ? ?Last Pain:  ?Vitals:  ? 05/16/21 1245  ?TempSrc:   ?PainSc: 5   ?   ? ?Patients Stated Pain Goal: 3 (05/16/21 1245) ? ?Complications: No notable events documented. ?

## 2021-05-16 NOTE — Anesthesia Preprocedure Evaluation (Addendum)
Anesthesia Evaluation  ?Patient identified by MRN, date of birth, ID band ?Patient awake ? ? ? ?Reviewed: ?Allergy & Precautions, NPO status , Patient's Chart, lab work & pertinent test results ? ?Airway ?Mallampati: II ? ?TM Distance: >3 FB ?Neck ROM: Full ? ? ? Dental ?no notable dental hx. ?(+) Dental Advisory Given ?  ?Pulmonary ?sleep apnea and Continuous Positive Airway Pressure Ventilation , Current Smoker,  ?  ?Pulmonary exam normal ? ? ? ? ? ? ? Cardiovascular ?hypertension, Pt. on medications ?+ CAD and +CHF  ?+ dysrhythmias Atrial Fibrillation  ?Rhythm:Irregular Rate:Normal ? ? ?  ?Neuro/Psych ?Anxiety negative neurological ROS ?   ? GI/Hepatic ?negative GI ROS, Neg liver ROS,   ?Endo/Other  ?Hyperthyroidism  ? Renal/GU ?CRFRenal disease  ?negative genitourinary ?  ?Musculoskeletal ?negative musculoskeletal ROS ?(+)  ? Abdominal ?Normal abdominal exam  (+)   ?Peds ? Hematology ?negative hematology ROS ?(+)   ?Anesthesia Other Findings ? ? Reproductive/Obstetrics ? ?  ? ? ? ? ? ? ? ? ? ? ? ? ? ?  ?  ? ? ? ? ? ? ?Anesthesia Physical ?Anesthesia Plan ? ?ASA: 3 ? ?Anesthesia Plan: General  ? ?Post-op Pain Management:   ? ?Induction: Intravenous ? ?PONV Risk Score and Plan: 1 and Ondansetron, Dexamethasone and Treatment may vary due to age or medical condition ? ?Airway Management Planned: Mask and Oral ETT ? ?Additional Equipment: Arterial line ? ?Intra-op Plan:  ? ?Post-operative Plan: Extubation in OR ? ?Informed Consent: I have reviewed the patients History and Physical, chart, labs and discussed the procedure including the risks, benefits and alternatives for the proposed anesthesia with the patient or authorized representative who has indicated his/her understanding and acceptance.  ? ? ? ?Dental advisory given ? ?Plan Discussed with: CRNA ? ?Anesthesia Plan Comments: (Lab Results ?     Component                Value               Date                 ?     WBC                       17.2 (H)            05/09/2021           ?     HGB                      16.9                05/09/2021           ?     HCT                      48.1                05/09/2021           ?     MCV                      89.6                05/09/2021           ?     PLT  230                 05/09/2021           ? ?ECHO 01/21: ??1. Left ventricular ejection fraction, by visual estimation, is 55 to  ?60%. The left ventricle has normal function. There is no left ventricular  ?hypertrophy.  ??2. Mildly dilated left ventricular internal cavity size.  ??3. The left ventricle has no regional wall motion abnormalities.  ??4. Global right ventricle has normal systolic function.The right  ?ventricular size is normal. No increase in right ventricular wall  ?thickness.  ??5. Left atrial size was normal.  ??6. Right atrial size was normal.  ??7. Mild mitral annular calcification.  ??8. The mitral valve is grossly normal. No evidence of mitral valve  ?regurgitation.  ??9. The tricuspid valve is grossly normal.  ?10. The aortic valve is tricuspid. Aortic valve regurgitation is not  ?visualized.  ?11. The pulmonic valve was not well visualized. Pulmonic valve  ?regurgitation is trivial.  ?12. TR signal is inadequate for assessing pulmonary artery systolic  ?pressure.  ?13. The inferior vena cava is normal in size with greater than 50%  ?respiratory variability, suggesting right atrial pressure of 3 mmHg. )  ? ? ? ? ? ? ?Anesthesia Quick Evaluation ? ?

## 2021-05-16 NOTE — Op Note (Signed)
Date of procedure: 05/16/2021 ? ?Date of dictation: Same ? ?Service: Neurosurgery ? ?Preoperative diagnosis: Right parasagittal posterior frontal tumor, meningioma versus metastasis versus intrinsic brain tumor ? ?Postoperative diagnosis: Same ? ?Procedure Name: Biparietal craniotomy and resection of tumor utilizing microdissection and intraoperative stereotactic guidance for volumetric resection ? ?Surgeon:Alexxander Kurt A.Maaz Spiering, M.D. ? ?Asst. Surgeon: Reinaldo Meeker, NP ? ?Anesthesia: General ? ?Indication: 51 year old male with progressive history of left lower extremity weakness with spasticity and simple partial seizures.  Patient now with left upper extremity weakness as well.  Work-up demonstrates evidence of a parasagittal mass which appears to be dural based although there is some components which make it look possibly more intrinsic to the brain itself.  The tumor is just anterior to the parasagittal lobule on the right side.  There is some surrounding edema.  The patient presents now for resection. ? ?Operative note: After induction anesthesia, patient position supine with his head flexed and held in place with a [place] pin headrest.  Using the BrainLab stereotactic system the patient's scalp and skull were registered into the computer and stereotactic navigation was enabled.  Planned entry site was obtained.  Scalp was prepped and draped sterilely.  I biparietal incision was made down to level the pericranium.  Retractor was placed.  Biparietal craniotomy was then performed by first drilling holes on each side of the superior sagittal sinus.  The dura was separated from the overlying skull.  A craniotomy flap was developed and the bone flap was elevated.  There is a large amount of bleeding from a large arachnoid granulation on the right side parasagittally.  This was controlled with Gelfoam and bipolar cautery.  There was some minor bleeding from the superior sagittal sinus which was controlled with Gelfoam.  The  dura was opened and reflected toward the left.  The underlying brain was somewhat tense and edematous.  Dissection was then made toward the midline and the cerebral hemisphere was gradually separated from the falx cerebri.  Neoplastic tumor came clearly into view.  The tumor itself was brown with some purplish component.  It was friable.  It was not hypervascular.  The tumor was resected in a piecemeal fashion.  Specimen was obtained for final pathology but frozen section was not felt to be helpful.  The tumor abutted the falx but was not clearly attached to the dura matter.  It appeared to be most likely primary CNS tumor althouit was difficult to say for certain . certainly this could be an atypical meningioma or possibly metastasis.  A gross total resection was obtained.  Hemostasis was achieved with SurgifoamIn the bipolar electrocautery.  The resection bed was lined with Surgicel.  The dura was loosely reapproximated.  Gelfoam was placed over the dural opening.  Bone flap was reapproximated using titanium plates.  Scalp was reapproximated using 2-0 Vicryl suture.  Staples were applied to the surface.  There were no apparent complications.  Patient tolerated the procedure well and he returns to the recovery room postop. ? ?

## 2021-05-17 ENCOUNTER — Inpatient Hospital Stay (HOSPITAL_COMMUNITY): Payer: BC Managed Care – PPO

## 2021-05-17 ENCOUNTER — Encounter (HOSPITAL_COMMUNITY): Payer: Self-pay | Admitting: Neurosurgery

## 2021-05-17 LAB — BASIC METABOLIC PANEL
Anion gap: 8 (ref 5–15)
BUN: 25 mg/dL — ABNORMAL HIGH (ref 6–20)
CO2: 21 mmol/L — ABNORMAL LOW (ref 22–32)
Calcium: 8.1 mg/dL — ABNORMAL LOW (ref 8.9–10.3)
Chloride: 106 mmol/L (ref 98–111)
Creatinine, Ser: 1.15 mg/dL (ref 0.61–1.24)
GFR, Estimated: >60 mL/min (ref >60)
Glucose, Bld: 133 mg/dL — ABNORMAL HIGH (ref 70–99)
Potassium: 4.4 mmol/L (ref 3.5–5.1)
Sodium: 135 mmol/L (ref 135–145)

## 2021-05-17 LAB — CBC
HCT: 40.3 % (ref 39.0–52.0)
Hemoglobin: 14.2 g/dL (ref 13.0–17.0)
MCH: 31.3 pg (ref 26.0–34.0)
MCHC: 35.2 g/dL (ref 30.0–36.0)
MCV: 89 fL (ref 80.0–100.0)
Platelets: 221 10*3/uL (ref 150–400)
RBC: 4.53 MIL/uL (ref 4.22–5.81)
RDW: 13.5 % (ref 11.5–15.5)
WBC: 19.9 10*3/uL — ABNORMAL HIGH (ref 4.0–10.5)
nRBC: 0 % (ref 0.0–0.2)

## 2021-05-17 IMAGING — MR MR HEAD WO/W CM
14 of 17 series · 37 of 48 positions shown · IV contrast (gadavist)
Comparison: Brain MRI [DATE]

CLINICAL DATA: CNS neoplasm post craniotomy

EXAM:
MRI HEAD WITHOUT AND WITH CONTRAST
TECHNIQUE: Multiplanar, multiecho pulse sequences of the brain and surrounding
structures were obtained without and with intravenous contrast.
CONTRAST:  9mL GADAVIST GADOBUTROL 1 MMOL/ML IV SOLN

[Series 5: DWI · axial · 3.0mm · 0.96mm/px · z∈[-97,+67]mm · 6 of 112 slices shown (1 of 4)]
[im 1/112]
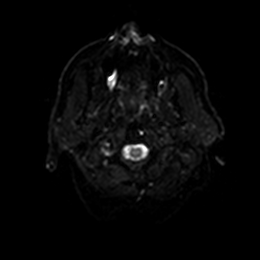
[im 23/112]
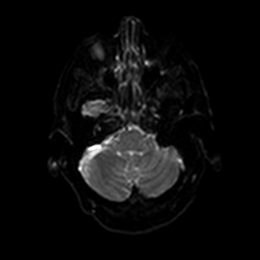
[im 45/112]
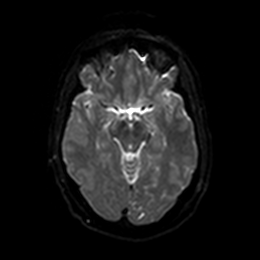
[im 67/112]
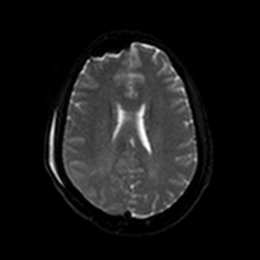
[im 89/112]
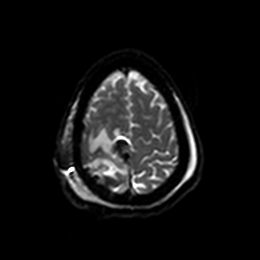
[im 112/112]
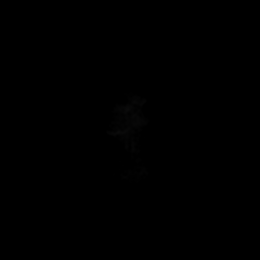

[Series 6: DWI · axial · 3.0mm · 0.96mm/px · z∈[-97,+67]mm · 3 of 56 slices shown (2 of 4)]
[im 1/56]
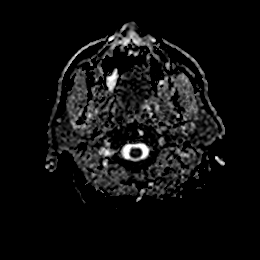
[im 28/56]
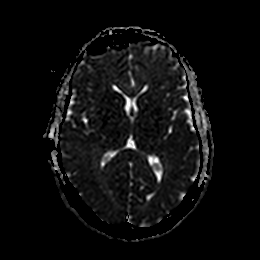
[im 56/56]
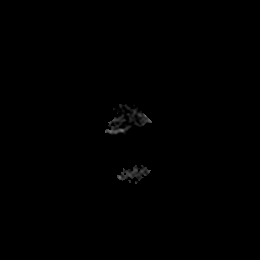

[Series 7: DWI · coronal · 4.0mm · 0.88mm/px · 4 of 80 slices shown (3 of 4)]
[im 1/80]
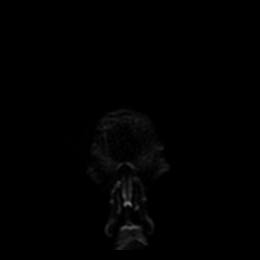
[im 27/80]
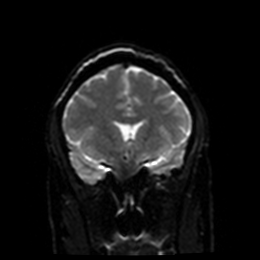
[im 53/80]
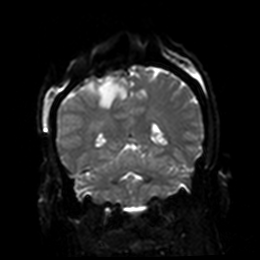
[im 80/80]
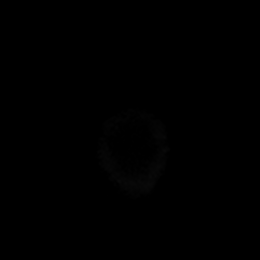

[Series 8: DWI · coronal · 4.0mm · 0.88mm/px · 2 of 40 slices shown (4 of 4)]
[im 1/40]
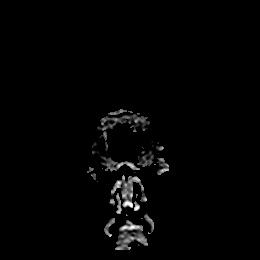
[im 40/40]
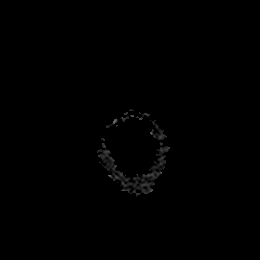

[Series 9: T1 · sagittal · 5.0mm · 0.78mm/px · 1 of 26 slices shown]
[im 1/26]
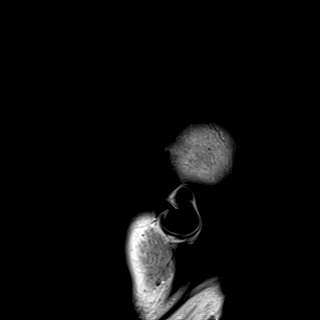

[Series 10: T2 · axial · 5.0mm · 0.78mm/px · z∈[-107,+77]mm · 2 of 32 slices shown]
[im 1/32]
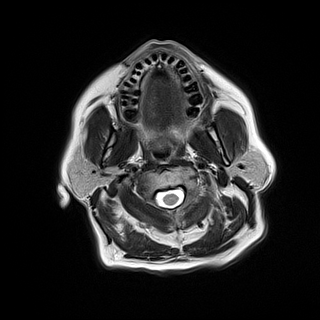
[im 32/32]
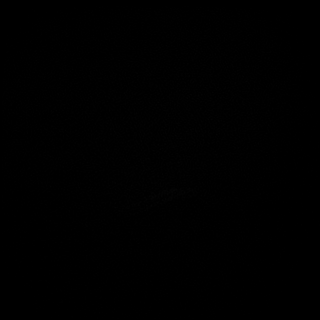

[Series 11: FLAIR · axial · 5.0mm · 0.45mm/px · z∈[-110,+74]mm · 2 of 32 slices shown]
[im 1/32]
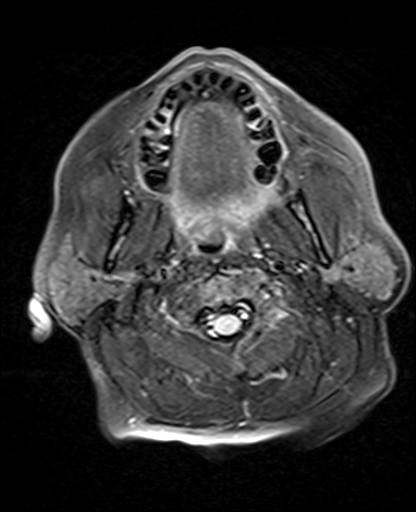
[im 32/32]
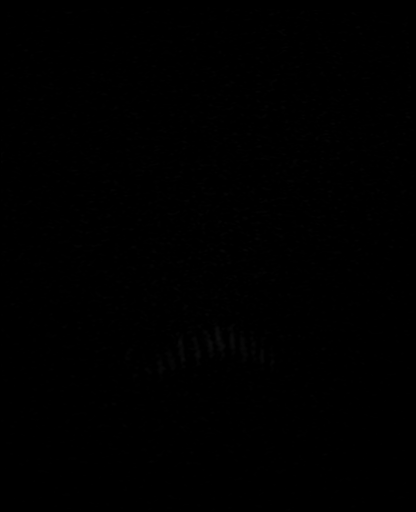

[Series 12: mag_images · axial · 3.0mm · 0.98mm/px · z∈[-105,+82]mm · 3 of 64 slices shown]
[im 1/64]
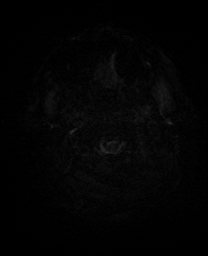
[im 32/64]
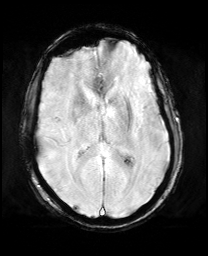
[im 64/64]
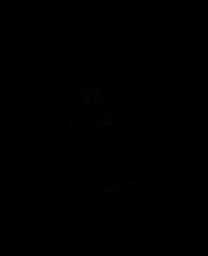

[Series 13: pha_images · axial · 3.0mm · 0.98mm/px · z∈[-105,+82]mm · 3 of 64 slices shown]
[im 1/64]
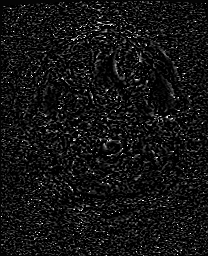
[im 32/64]
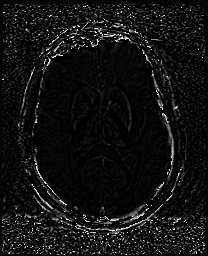
[im 64/64]
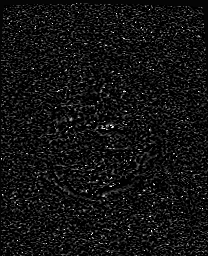

[Series 14: swi_images · axial · 3.0mm · 0.98mm/px · z∈[-105,+82]mm · 3 of 64 slices shown]
[im 1/64]
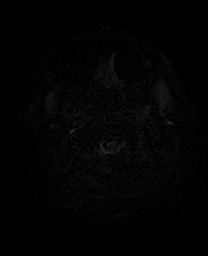
[im 32/64]
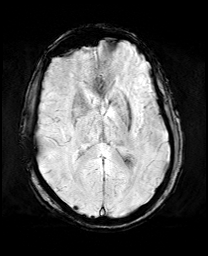
[im 64/64]
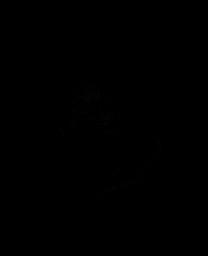

[Series 15: mip_images(sw) · axial · 24.0mm · 0.98mm/px · z∈[-95,+71]mm · 3 of 57 slices shown]
[im 1/57]
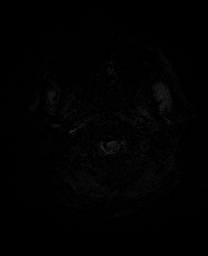
[im 29/57]
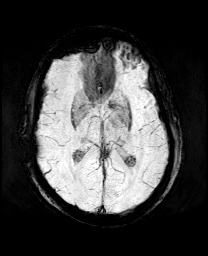
[im 57/57]
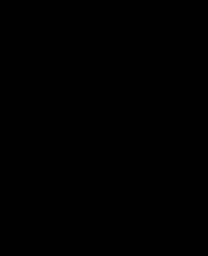

[Series 17: T2 post-contrast · coronal · 5.0mm · 0.72mm/px · 2 of 32 slices shown]
[im 1/32]
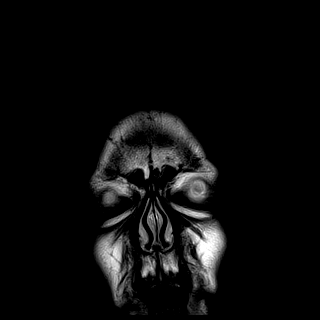
[im 32/32]
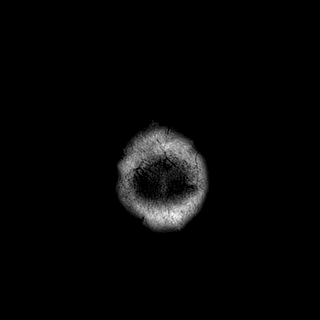

[Series 19: T1 post-contrast · coronal · 5.0mm · 0.34mm/px · 2 of 32 slices shown (1 of 2)]
[im 1/32]
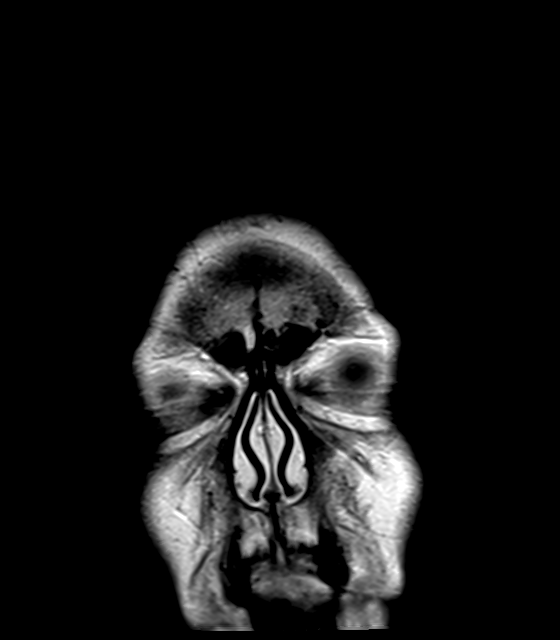
[im 32/32]
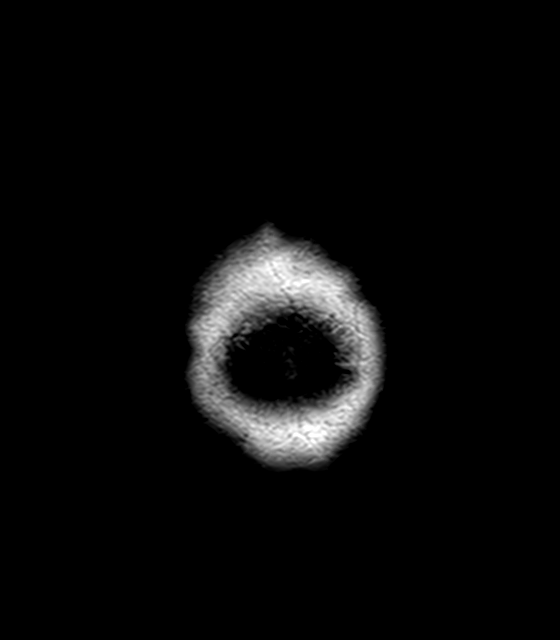

[Series 20: T1 post-contrast · sagittal · 5.0mm · 0.78mm/px · 1 of 26 slices shown (2 of 2)]
[im 1/26]
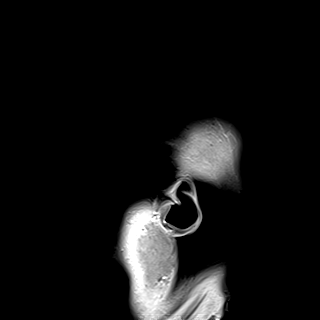

[37 of 48 positions shown; findings below may reference images not displayed]

FINDINGS: Brain: There has been interval bi parietal craniotomy for mass
resection. There is expected postoperative fluid and blood in the
resection cavity. There is also expected postoperative
pneumocephalus overlying the right frontal lobe and extra-axial
fluid/blood products measuring approximally 6 mm underlying the
craniotomy. Some of the blood is intrinsically T1 hyperintense.
There is curvilinear peripheral enhancement along the antero
inferior margin of the resection cavity (18-47, 20-11). There is no
other persistent enhancement.

Vasogenic edema surrounding the resection cavity predominantly in
the pre and postcentral gyri is increased compared to the
preoperative study. There is sulcal effacement but no midline shift.
There is also mild FLAIR signal abnormality in the right
peritrigonal white matter, nonspecific and unchanged.

There is no evidence of acute territorial infarct. The ventricles
are normal in size. A developmental venous anomaly is noted in the
left frontal lobe. There is no other abnormal enhancement.

Vascular: Normal flow voids.

Skull and upper cervical spine: Normal marrow signal.

Sinuses/Orbits: The paranasal sinuses are clear. The globes and
orbits are unremarkable.

Other: None.
IMPRESSION: 1. Status post biparietal craniotomy for right parafalcine mass
resection. There is curvilinear enhancement along the anteroinferior
margin of the resection cavity which is nonspecific but could
reflect residual tumor. Continued attention on follow-up.
2. Increased vasogenic edema surrounding the resection cavity
compared to the preoperative study. There is regional sulcal
effacement but no midline shift.
3. Mild FLAIR signal abnormality in the right peritrigonal white
matter is nonspecific and unchanged. Recommend attention on
follow-up.

## 2021-05-17 MED ORDER — CHLORHEXIDINE GLUCONATE CLOTH 2 % EX PADS
6.0000 | MEDICATED_PAD | Freq: Every day | CUTANEOUS | Status: DC
  Administered 2021-05-18: 6 via TOPICAL

## 2021-05-17 MED ORDER — GADOBUTROL 1 MMOL/ML IV SOLN
9.0000 mL | Freq: Once | INTRAVENOUS | Status: AC | PRN
  Administered 2021-05-17: 9 mL via INTRAVENOUS

## 2021-05-17 MED ORDER — PANTOPRAZOLE SODIUM 40 MG PO TBEC
40.0000 mg | DELAYED_RELEASE_TABLET | Freq: Every day | ORAL | Status: DC
Start: 2021-05-17 — End: 2021-05-18
  Administered 2021-05-17: 40 mg via ORAL
  Filled 2021-05-17: qty 1

## 2021-05-17 MED FILL — Thrombin For Soln 5000 Unit: CUTANEOUS | Qty: 5000 | Status: AC

## 2021-05-17 NOTE — Progress Notes (Addendum)
Pt notified nurse of feeling "droopy" on the L side of his face, and "more weak" in his LUE. With eyes closed, pt stated both sides of face sensation were equal; no facial droop noted; speech clear. Pt with some drift to LUE which was present on initial assessment. Pt stating unsure if this was pain related bc "it doesn't hurt like pain". Pt stated his desire to take his scheduled evening meds along with a PRN pain med and reassess in about an hr. Also of note, when returning back to the room with meds pt stated he felt "clearer now".  ? ?Follow up with pt; stated he does feel better with very little to no "droopy" sensation currently.  ?

## 2021-05-17 NOTE — Progress Notes (Signed)
Pt receiving PT at time of visit. Chaplain briefly checked in with pt's spouse. She shared they're still awaiting biopsy results. She has good support from family. Will continue to follow. ? ?Please page as further needs arise. ? ?Donald Prose. Elyn Peers, M.Div. [facility] ?Chaplain ?Pager [phone] ?Office [phone] ? ?

## 2021-05-17 NOTE — Progress Notes (Signed)
Inpatient Rehabilitation Admissions Coordinator  ? ?Current therapy recommendations are for Outpatient therapy. ? ?Danne Baxter, RN, MSN ?Rehab Admissions Coordinator ?[phone] ?05/17/2021 6:16 PM ? ?

## 2021-05-17 NOTE — Progress Notes (Signed)
?  Transition of Care (TOC) Screening Note ? ? ?Patient Details  ?Name: Jerry Richards ?Date of Birth: 06-Feb-1971 ? ? ?Transition of Care (TOC) CM/SW Contact:    ?Benard Halsted, LCSW ?Phone Number: ?05/17/2021, 10:12 AM ? ? ? ?Transition of Care Department [facility]) has reviewed patient and no TOC needs have been identified at this time. We will continue to monitor patient advancement through interdisciplinary progression rounds. If new patient transition needs arise, please place a TOC consult. ? ? ?

## 2021-05-17 NOTE — Evaluation (Addendum)
Physical Therapy Evaluation ?Patient Details ?Name: Jerry Richards ?MRN: [id number] ?DOB: [birth year] ?Today's Date: 05/17/2021 ? ?History of Present Illness ? 51 yo male with LLE weakness. MRI showing partially necrotic mass at the right frontoparietal vertex. S/p  Biparietal craniotomy and resection of tumor on 3/20. PMH including anxiety, a-fib, and L heart cath (2013).  ?Clinical Impression ? Patient able to ambulate in hallway with mod A with significant fall risk and risk for injury.  He demonstrates decreased isolated motor control in L LE, decreased balance, decreased sequencing and timing, and has cognitive deficits including decreased L side awareness, decreased safety/deficit awareness, and decreased memory.  He was functioning previously without assistive device but wearing L ankle brace.  Feel he will benefit from skilled PT in the acute setting and recommending follow up outpatient rehab and home with family support.  Will need orthotist consult for AFO prior to d/c as well.  ?   ? ?Recommendations for follow up therapy are one component of a multi-disciplinary discharge planning process, led by the attending physician.  Recommendations may be updated based on patient status, additional functional criteria and insurance authorization. ? ?Follow Up Recommendations Outpatient PT ? ?  ?Assistance Recommended at Discharge Intermittent Supervision/Assistance  ?Patient can return home with the following ? A lot of help with walking and/or transfers;A lot of help with bathing/dressing/bathroom;Direct supervision/assist for medications management;Assist for transportation;Help with stairs or ramp for entrance;Direct supervision/assist for financial management ? ?  ?Equipment Recommendations Other (comment) (L AFO)  ?Recommendations for Other Services ?    ?  ?Functional Status Assessment Patient has had a recent decline in their functional status and demonstrates the ability to make significant improvements in  function in a reasonable and predictable amount of time.  ? ?  ?Precautions / Restrictions Precautions ?Precautions: Fall ?Precaution Comments: L side ataxia  ? ?  ? ?Mobility ? Bed Mobility ?Overal bed mobility: Needs Assistance ?Bed Mobility: Supine to Sit ?  ?  ?Supine to sit: Min guard ?  ?  ?General bed mobility comments: assist for lines, safety and due to not using L UE heavy reliance on R side to perform ?  ? ?Transfers ?Overall transfer level: Needs assistance ?Equipment used: Rolling walker (2 wheels) ?Transfers: Sit to/from Stand ?Sit to Stand: Min assist ?  ?  ?  ?  ?  ?General transfer comment: assist for balance, and cues for hand placement ?  ? ?Ambulation/Gait ?Ambulation/Gait assistance: Mod assist ?Gait Distance (Feet): 50 Feet (x 2) ?Assistive device: Rolling walker (2 wheels) ?Gait Pattern/deviations: Step-through pattern, Knee hyperextension - left, Decreased dorsiflexion - left, Ataxic, Wide base of support ?  ?  ?  ?General Gait Details: assist for walker as poorly controlled and lifting at times, assist for balance and cues throughout for safety due to pt moving fast and L ankle at times poorly positioned for keep from eversion, flexed posture with poor L hand positioning on walker.  Donned shoes for second bout of ambulation with ASO on L LE with lateral supports and shoes on.  Last few feet with bilateral HHA for improved upright posture and cues for L glut activation for less knee hyperextension ? ?Stairs ?  ?  ?  ?  ?  ? ?Wheelchair Mobility ?  ? ?Modified Rankin (Stroke Patients Only) ?  ? ?  ? ?Balance Overall balance assessment: Needs assistance ?Sitting-balance support: No upper extremity supported ?Sitting balance-Leahy Scale: Fair ?Sitting balance - Comments: able to assist to don shoe  in sitting when L leg crossed over knee ?  ?Standing balance support: Bilateral upper extremity supported ?Standing balance-Leahy Scale: Poor ?  ?  ?  ?  ?  ?  ?  ?  ?  ?  ?  ?  ?   ? ? ? ?Pertinent  Vitals/Pain Pain Assessment ?Pain Assessment: 0-10 ?Pain Score: 5  ?Pain Location: head ?Pain Descriptors / Indicators: Aching ?Pain Intervention(s): Monitored during session, Patient requesting pain meds-RN notified  ? ? ?Home Living Family/patient expects to be discharged to:: Private residence ?Living Arrangements: Spouse/significant other ?Available Help at Discharge: Family;Available 24 hours/day ?Type of Home: House ?Home Access: Stairs to enter ?Entrance Stairs-Rails: Can reach both ?Entrance Stairs-Number of Steps: 3 +3 ?Alternate Level Stairs-Number of Steps: 12 ?Home Layout: Two level ?Home Equipment: Conservation officer, nature (2 wheels);Rollator (4 wheels);Cane - single point ?   ?  ?Prior Function Prior Level of Function : Independent/Modified Independent ?  ?  ?  ?  ?  ?  ?Mobility Comments: Independent with mobility until 3/20 when he was requiring a cane ?ADLs Comments: Pt was working up till 3/17. Was performing ADLs and driving. ?  ? ? ?Hand Dominance  ? Dominant Hand: Right ? ?  ?Extremity/Trunk Assessment  ? Upper Extremity Assessment ?Upper Extremity Assessment: LUE deficits/detail ?LUE Deficits / Details: Decreased FM coordination and strength. Poor in hand manipulation. Decreased targeted reach. inattention to L ?LUE Coordination: decreased fine motor;decreased gross motor ?  ? ?Lower Extremity Assessment ?Lower Extremity Assessment: LLE deficits/detail ?LLE Deficits / Details: moves initially in flexion vs extension synerty for ankle movement, which he reports is more than he had prior to surgery. can lift leg anitgravity with effort due to moving out of synergy; weak hamstrings with + knee hyperextension and ankle positioned in equinovarus initially, able to use verbalizations to get foot flat in stance with ambulation, though high risk for eversion ankle injury ?LLE Sensation: decreased light touch ?LLE Coordination: decreased gross motor ?  ? ?   ?Communication  ? Communication: No difficulties   ?Cognition Arousal/Alertness: Awake/alert ?Behavior During Therapy: Impulsive ?Overall Cognitive Status: Impaired/Different from baseline ?Area of Impairment: Attention, Memory, Following commands, Safety/judgement, Awareness, Problem solving ?  ?  ?  ?  ?  ?  ?  ?  ?  ?Current Attention Level: Selective ?Memory: Decreased short-term memory ?Following Commands: Follows one step commands consistently, Follows multi-step commands with increased time ?Safety/Judgement: Decreased awareness of safety, Decreased awareness of deficits ?  ?Problem Solving: Difficulty sequencing, Requires verbal cues ?General Comments: L inattention ?  ?  ? ?  ?General Comments General comments (skin integrity, edema, etc.): wife present and supportive, VSS, RN delivered IV meds prior to mobility ? ?  ?Exercises Other Exercises ?Other Exercises: seated ankle DF with L foot on floor and knee partially extended alternating with R ?Other Exercises: L knee flexion in sitting for hamstring strengthening  ? ?Assessment/Plan  ?  ?PT Assessment Patient needs continued PT services  ?PT Problem List Decreased strength;Decreased mobility;Decreased balance;Decreased safety awareness;Decreased cognition;Decreased activity tolerance;Decreased knowledge of use of DME;Impaired sensation;Decreased knowledge of precautions;Decreased coordination ? ?   ?  ?PT Treatment Interventions DME instruction;Therapeutic activities;Cognitive remediation;Patient/family education;Therapeutic exercise;Gait training;Stair training;Balance training;Functional mobility training;Neuromuscular re-education   ? ?PT Goals (Current goals can be found in the Care Plan section)  ?Acute Rehab PT Goals ?Patient Stated Goal: to return to independent ?PT Goal Formulation: With patient/family ?Time For Goal Achievement: 05/31/21 ?Potential to Achieve Goals: Good ? ?  ?  Frequency Min 4X/week ?  ? ? ?Co-evaluation   ?  ?  ?  ?  ? ? ?  ?AM-PAC PT "6 Clicks" Mobility  ?Outcome Measure Help  needed turning from your back to your side while in a flat bed without using bedrails?: A Little ?Help needed moving from lying on your back to sitting on the side of a flat bed without using bedrails?: A Little

## 2021-05-17 NOTE — Progress Notes (Signed)
Postop day 1.  Patient doing well.  Had some headache overnight but no headache this morning.  Left arm and leg working much better.  Couple areas of some mild tremulousness in his left leg but no clear-cut seizure activity. ? ?Afebrile.  Vital signs are stable.  He is awake and alert.  He is oriented x4.  His speech is fluent.  Cranial nerve function normal bilateral.  Motor examination 5/5 in both right upper and lower extremity.  5/5 strength left upper extremity with some difficulty with motor planning apraxia.  Left lower extremity 4+/5.  Wound clean and dry.  Chest and abdomen benign. ? ?Overall progressing well.  Follow-up MRI scan pending.  Out of bed today mobilize with therapy. ?

## 2021-05-17 NOTE — Evaluation (Signed)
Occupational Therapy Evaluation ?Patient Details ?Name: IVERSON SEES ?MRN: [id number] ?DOB: Nov 26, 1970 ?Today's Date: 05/17/2021 ? ? ?History of Present Illness 51 yo male with LLE weakness. MRI showing partially necrotic mass at the right frontoparietal vertex. S/p  Biparietal craniotomy and resection of tumor on 3/20. PMH including anxiety, a-fib, and L heart cath (2013).  ? ?Clinical Impression ?  ?PTA, pt was living with his wife and was independent; working up till last Friday. Pt currently requiring Min A for LB ADLs and functional mobility with RW. Pt presenting with decreased balance, cognition, functional use of LUE, and strength/coordination of LLE. Provided therapeutic exercises instructions (stacking cups) to address L in hand manipulation, coordination, and inattention. Pt would benefit from further acute OT to facilitate safe dc. Recommend dc to home with follow up at neuro OP for further OT to optimize safety, independence with ADLs, and return to PLOF.   ?   ? ?Recommendations for follow up therapy are one component of a multi-disciplinary discharge planning process, led by the attending physician.  Recommendations may be updated based on patient status, additional functional criteria and insurance authorization.  ? ?Follow Up Recommendations ? Outpatient OT (Neuro OP)  ?  ?Assistance Recommended at Discharge Frequent or constant Supervision/Assistance  ?Patient can return home with the following A little help with walking and/or transfers;A little help with bathing/dressing/bathroom ? ?  ?Functional Status Assessment ? Patient has had a recent decline in their functional status and demonstrates the ability to make significant improvements in function in a reasonable and predictable amount of time.  ?Equipment Recommendations ? BSC/3in1  ?  ?Recommendations for Other Services PT consult ? ? ?  ?Precautions / Restrictions Precautions ?Precautions: Fall  ? ?  ? ?Mobility Bed Mobility ?Overal bed  mobility: Needs Assistance ?Bed Mobility: Supine to Sit ?  ?  ?Supine to sit: Min guard ?  ?  ?General bed mobility comments: Close Min Guard A due to impulsivity and quick movements. ?  ? ?Transfers ?Overall transfer level: Needs assistance ?Equipment used: Rolling walker (2 wheels) ?Transfers: Sit to/from Stand ?Sit to Stand: Min assist ?  ?  ?  ?  ?  ?General transfer comment: Min A for gaining balance in standing with slight L lateral push. ?  ? ?  ?Balance Overall balance assessment: Needs assistance ?Sitting-balance support: No upper extremity supported, Feet supported ?Sitting balance-Leahy Scale: Fair ?  ?  ?Standing balance support: Bilateral upper extremity supported, During functional activity ?Standing balance-Leahy Scale: Poor ?  ?  ?  ?  ?  ?  ?  ?  ?  ?  ?  ?  ?   ? ?ADL either performed or assessed with clinical judgement  ? ?ADL Overall ADL's : Needs assistance/impaired ?Eating/Feeding: Set up;Sitting ?  ?Grooming: Supervision/safety;Set up;Sitting;Cueing for compensatory techniques ?  ?Upper Body Bathing: Supervision/ safety;Set up;Sitting ?  ?Lower Body Bathing: Minimal assistance;Sit to/from stand ?  ?Upper Body Dressing : Supervision/safety;Set up;Sitting ?  ?Lower Body Dressing: Minimal assistance;Sit to/from stand ?  ?Toilet Transfer: Minimal assistance;Ambulation;Rolling walker (2 wheels) (simulated to recliner) ?Toilet Transfer Details (indicate cue type and reason): Min A for gaining balance in standing with slight L lateral push. ?  ?  ?  ?  ?Functional mobility during ADLs: Minimal assistance;Rolling walker (2 wheels);+2 for safety/equipment (chair follow) ?General ADL Comments: Pt presenting with decreased coorindation, strength, and attention to LUE/LLE impacting his functional performance.  ? ? ? ?Vision   ?   ?   ?  Perception Perception ?Comments: Mutiple episodes of knocking LUE into objects or furnature ?  ?Praxis   ?  ? ?Pertinent Vitals/Pain    ? ? ? ?Hand Dominance Right ?   ?Extremity/Trunk Assessment Upper Extremity Assessment ?Upper Extremity Assessment: LUE deficits/detail ?LUE Deficits / Details: Decreased FM coordination and strength. Poor in hand manipulation. Decreased targeted reach. inattention to L ?LUE Coordination: decreased fine motor;decreased gross motor ?  ?Lower Extremity Assessment ?Lower Extremity Assessment: Defer to PT evaluation ?  ?  ?  ?Communication   ?  ?Cognition Arousal/Alertness: Awake/alert ?Behavior During Therapy: Impulsive ?Overall Cognitive Status: Impaired/Different from baseline ?Area of Impairment: Attention, Memory, Following commands, Safety/judgement, Awareness, Problem solving ?  ?  ?  ?  ?  ?  ?  ?  ?  ?Current Attention Level: Selective ?Memory: Decreased short-term memory ?Following Commands: Follows one step commands with increased time ?Safety/Judgement: Decreased awareness of safety, Decreased awareness of deficits ?Awareness: Emergent ?Problem Solving: Requires verbal cues, Difficulty sequencing ?General Comments: Very motivated. Pt with increased impulsivity. Pt and his wife reporitng he has a quick personality but wife reports he has been moving more quickly and less safely in the las t couple of weeks. During session, pt requiring repeated cues to follow instructed as he would assume what was next and then try to perform this. Pt also with decreased problem solve and sequencing. Inattention to L. Perseverating on certain questions and decreased memory to recall easlier education and application. ?  ?  ?General Comments  Wife present throughout ? ?  ?Exercises Exercises: Other exercises ?Other Exercises ?Other Exercises: Pt stacking six cups into a pyramid. Requiring three trials to complete task as he presented with dropping of cups and knocking cups over. Pt presenting with decreased targeted reach, grasp, and in hand manipulation. ?Other Exercises: Pt stacking cups by twos on top of eachother (bottom cup facing down and top cup  facing up). Decreased attention to LUE and knocking cups over; requiring ~8 trials before stacking all of them up correctly. Decreased in hand manipulation and grasp. ?  ?Shoulder Instructions    ? ? ?Home Living Family/patient expects to be discharged to:: Private residence ?Living Arrangements: Spouse/significant other ?Available Help at Discharge: Family;Available 24 hours/day (Initial) ?Type of Home: House ?Home Access: Stairs to enter ?Entrance Stairs-Number of Steps: 3 +3 ?  ?  ?  ?  ?Bathroom Shower/Tub: Walk-in shower ?  ?Bathroom Toilet: Standard ?  ?  ?Home Equipment: Conservation officer, nature (2 wheels);Rollator (4 wheels);Cane - single point ?  ?  ?  ? ?  ?Prior Functioning/Environment Prior Level of Function : Independent/Modified Independent ?  ?  ?  ?  ?  ?  ?Mobility Comments: Independent with mobility until 3/20 when he was requiring a cane ?ADLs Comments: Pt was working up till 3/17. Was performing ADLs and driving. ?  ? ?  ?  ?OT Problem List: Decreased strength;Decreased range of motion;Decreased activity tolerance;Impaired balance (sitting and/or standing);Decreased cognition;Decreased knowledge of precautions ?  ?   ?OT Treatment/Interventions: Self-care/ADL training;Therapeutic exercise;Energy conservation;DME and/or AE instruction;Therapeutic activities;Patient/family education  ?  ?OT Goals(Current goals can be found in the care plan section) Acute Rehab OT Goals ?Patient Stated Goal: return to independence ?OT Goal Formulation: With patient/family ?Time For Goal Achievement: 05/31/21 ?Potential to Achieve Goals: Good  ?OT Frequency: Min 3X/week ?  ? ?Co-evaluation   ?  ?  ?  ?  ? ?  ?AM-PAC OT "6 Clicks" Daily Activity     ?  Outcome Measure Help from another person eating meals?: None ?Help from another person taking care of personal grooming?: A Little ?Help from another person toileting, which includes using toliet, bedpan, or urinal?: A Little ?Help from another person bathing (including washing,  rinsing, drying)?: A Little ?Help from another person to put on and taking off regular upper body clothing?: A Little ?Help from another person to put on and taking off regular lower body clothing?: A Little ?6 Click [place]

## 2021-05-17 NOTE — Progress Notes (Signed)
Orthopedic Tech Progress Note ?Patient Details:  ?Jerry Richards ?11-03-1970 ?[id number] ? ?Called in order to [facility] for an AFO  ? ?Patient ID: Jerry Richards, male   DOB: Dec 21, 1970, 51 y.o.   MRN: [id number] ? ?[name] ?05/17/2021, 5:24 PM ? ?

## 2021-05-18 MED ORDER — HYDROCODONE-ACETAMINOPHEN 5-325 MG PO TABS: 1.0000 | ORAL_TABLET | ORAL | 0 refills | Status: DC | PRN

## 2021-05-18 MED ORDER — DEXAMETHASONE 4 MG PO TABS: 4.0000 mg | ORAL_TABLET | Freq: Four times a day (QID) | ORAL | 0 refills | Status: DC

## 2021-05-18 MED ORDER — LEVETIRACETAM 500 MG PO TABS: 1000.0000 mg | ORAL_TABLET | Freq: Two times a day (BID) | ORAL | 2 refills | Status: DC

## 2021-05-18 NOTE — Progress Notes (Signed)
Patient feeling good this morning.  Denies headache.  Continues to regain strength and control of his left upper and lower extremity. ? ?He is afebrile.His vitals are stable.  He is awake and alert.  He is oriented and appropriate.  Face symmetric bilaterally.  Left upper extremity 4+5 with some apraxia.  Left lower extremity 4-4+5 with some apraxia.  Right upper and lower extremity normal. ? ?FOLLOW-up MRI scan done yesterday demonstrates good appearance of his tumor resection without obvious residual tumor or new infarct.  There is some expected edema in his premotor region on the right side. ? ?Overall doing well.  Should be ready for discharge today.  We will hold off on restarting anticoagulation for at least a week. ?

## 2021-05-18 NOTE — Discharge Summary (Signed)
Physician Discharge Summary  ?Patient ID: ?Jerry Richards ?MRN: [id number] ?DOB/AGE: [birth year] 51 y.o. ? ?Admit date: 05/16/2021 ?Discharge date: 05/18/2021 ? ?Admission Diagnoses: ? ?Discharge Diagnoses:  ?Principal Problem: ?  Brain tumor ([place]) ? ? ?Discharged Condition: good ? ?Hospital Course: Patient admitted to the hospital for treatment of a newly discovered right parasagittal tumor.  Patient underwent uncomplicated bilateral parietal craniotomy and resection of tumor.  Postoperatively he is doing reasonably well.  His preoperative left sided weakness is much improved.  He is standing and ambulating with minimal assistance.  His speech and language are good.  He is having minimal pain.  He is voiding well.  He feels ready for discharge home.  Pathology is still pending.  Plan for him to return home on Decadron and Keppra ?Consults:  ? ?Significant Diagnostic Studies:  ? ?Treatments:  ? ?Discharge Exam: ?Blood pressure 117/71, pulse (!) 49, temperature 98.5 ?F (36.9 ?C), temperature source Oral, resp. rate 14, height 6' (1.829 m), weight 96.5 kg, SpO2 96 %. ?Awake and alert.  Oriented and appropriate.  Speech fluent.  Judgment insight are intact.  Cranial nerve function normal bilaterally.  Motor examination with 5/5 strength in his right upper and lower extremity.  4+/5 strength in his left upper extremity with some motor planning difficulty.  4 to 4+/5 strength in his left lower extremity with some motor planning difficulty.  Wound clean and dry.  Chest and abdomen benign. ? ?Disposition: Discharge disposition: 01-Home or Self Care ? ? ? ? ? ? ? ?Allergies as of 05/18/2021   ? ?   Reactions  ? Amiodarone   ? AMIODARONE ANALOGUES ?Hyperthyroidism  ? Morphine And Related Palpitations  ? Chest pressure/palpitations  ? ?  ? ?  ?Medication List  ?  ? ?STOP taking these medications   ? ?Xarelto 20 MG Tabs tablet ?Generic drug: rivaroxaban ?  ? ?  ? ?TAKE these medications   ? ?acetaminophen 500 MG tablet ?Commonly  known as: TYLENOL ?Take 1,000 mg by mouth every 6 (six) hours as needed for moderate pain. ?  ?baclofen 10 MG tablet ?Commonly known as: LIORESAL ?Take 1 tablet (10 mg total) by mouth 3 (three) times daily. ?  ?dexamethasone 4 MG tablet ?Commonly known as: DECADRON ?Take 1 tablet (4 mg total) by mouth 4 (four) times daily. ?What changed: when to take this ?  ?gabapentin 100 MG capsule ?Commonly known as: NEURONTIN ?Take 100 mg by mouth 3 (three) times daily. ?  ?HYDROcodone-acetaminophen 5-325 MG tablet ?Commonly known as: NORCO/VICODIN ?Take 1 tablet by mouth every 4 (four) hours as needed for moderate pain. ?  ?levETIRAcetam 500 MG tablet ?Commonly known as: KEPPRA ?Take 2 tablets (1,000 mg total) by mouth 2 (two) times daily. ?What changed: how much to take ?  ?losartan 50 MG tablet ?Commonly known as: COZAAR ?Take 50 mg by mouth daily. ?  ? ?  ? ? ? ?Signed: ?Cooper Render Sharesa Kemp ?05/18/2021, 8:46 AM ? ? ?

## 2021-05-18 NOTE — Discharge Instructions (Signed)
Wound Care ?Keep incision covered and dry for two days.  If you shower, cover incision with plastic wrap.  ?Do not put any creams, lotions, or ointments on incision. ?Leave steri-strips on back.  They will fall off by themselves. ?Activity ?Walk each and every day, increasing distance each day. ?No lifting greater than 5 lbs.  Avoid excessive neck motion. ?No driving for 2 weeks; may ride as a passenger locally. ?If provided with back brace, wear when out of bed.  It is not necessary to wear brace in bed. ?Diet ?Resume your normal diet.  ?Return to Work ?Will be discussed at you follow up appointment. ?Call Your Doctor If Any of These Occur ?Redness, drainage, or swelling at the wound.  ?Temperature greater than 101 degrees. ?Severe pain not relieved by pain medication. ?Incision starts to come apart. ?Follow Up Appt ?Call today for appointment in 1-2 weeks ([phone]) or for problems.  If you have any hardware placed in your spine, you will need an x-ray before your appointment. ? ? ?SSTOP XARELTO TILL SEEN IN Follow up appointment ?

## 2021-05-18 NOTE — Progress Notes (Signed)
Occupational Therapy Treatment ?Patient Details ?Name: Jerry Richards ?MRN: [id number] ?DOB: 1971-01-17 ?Today's Date: 05/18/2021 ? ? ?History of present illness 51 yo male with LLE weakness. MRI showing partially necrotic mass at the right frontoparietal vertex. S/p  Biparietal craniotomy and resection of tumor on 3/20. PMH including anxiety, a-fib, and L heart cath (2013). ?  ?OT comments ? Pt is making steady progress with adls and fine motor tasks. Pt continues to have deficits in L fine motor coordination, proprioception in the L side, impulsivity, attn. Span and safety.  Feel pt will need strict supervision since he is d/cing home and will need someone to be with him anytime he is on his feet.  Will continue to rec OPOT. ?  ? ?Recommendations for follow up therapy are one component of a multi-disciplinary discharge planning process, led by the attending physician.  Recommendations may be updated based on patient status, additional functional criteria and insurance authorization. ?   ?Follow Up Recommendations ? Outpatient OT  ?  ?Assistance Recommended at Discharge Frequent or constant Supervision/Assistance  ?Patient can return home with the following ? A little help with walking and/or transfers;A little help with bathing/dressing/bathroom ?  ?Equipment Recommendations ? BSC/3in1  ?  ?Recommendations for Other Services   ? ?  ?Precautions / Restrictions Precautions ?Precautions: Fall ?Precaution Comments: L side ataxia ?Restrictions ?Weight Bearing Restrictions: No  ? ? ?  ? ?Mobility Bed Mobility ?Overal bed mobility: Needs Assistance ?Bed Mobility: Supine to Sit ?  ?  ?Supine to sit: Min guard ?  ?  ?General bed mobility comments: assist for lines and impulsivity. ?  ? ?Transfers ?Overall transfer level: Needs assistance ?Equipment used: Rolling walker (2 wheels) ?Transfers: Sit to/from Stand ?Sit to Stand: Min assist ?  ?  ?  ?  ?  ?General transfer comment: assist for balance, and cues for hand placement ?   ?  ?Balance Overall balance assessment: Needs assistance ?Sitting-balance support: No upper extremity supported ?Sitting balance-Leahy Scale: Fair ?Sitting balance - Comments: able to assist to don shoe in sitting when L leg crossed over knee ?  ?Standing balance support: Bilateral upper extremity supported ?Standing balance-Leahy Scale: Poor ?Standing balance comment: Pt must have outside support to stand. ?  ?  ?  ?  ?  ?  ?  ?  ?  ?  ?  ?   ? ?ADL either performed or assessed with clinical judgement  ? ?ADL Overall ADL's : Needs assistance/impaired ?Eating/Feeding: Set up;Sitting ?  ?Grooming: Wash/dry hands;Wash/dry face;Oral care;Min guard;Standing ?  ?Upper Body Bathing: Supervision/ safety;Set up;Sitting ?  ?Lower Body Bathing: Minimal assistance;Sit to/from stand ?  ?Upper Body Dressing : Minimal assistance;Sitting ?Upper Body Dressing Details (indicate cue type and reason): assist with all fasterners and buttons etc ?Lower Body Dressing: Minimal assistance;Sit to/from stand ?  ?  ?  ?  ?  ?  ?  ?Functional mobility during ADLs: Minimal assistance;Rolling walker (2 wheels) ?General ADL Comments: Pt presenting with decreased coorindation, strength, and attention to LUE/LLE impacting his functional performance. ?  ? ?Extremity/Trunk Assessment Upper Extremity Assessment ?Upper Extremity Assessment: LUE deficits/detail ?LUE Deficits / Details: Pt with mild strength deficits, poor coordination and poor proprioception affecting ability to use LUE. ?LUE Sensation: decreased light touch;decreased proprioception ?LUE Coordination: decreased fine motor;decreased gross motor ?  ?Lower Extremity Assessment ?Lower Extremity Assessment: Defer to PT evaluation ?  ?  ?  ? ?Vision   ?Vision Assessment?: No apparent visual deficits ?  ?  Perception   ?  ?Praxis Praxis ?Praxis: Intact ?  ? ?Cognition Arousal/Alertness: Awake/alert ?Behavior During Therapy: Impulsive ?Overall Cognitive Status: Impaired/Different from  baseline ?Area of Impairment: Attention, Memory, Following commands, Safety/judgement, Awareness, Problem solving ?  ?  ?  ?  ?  ?  ?  ?  ?  ?Current Attention Level: Selective ?Memory: Decreased short-term memory ?Following Commands: Follows one step commands consistently, Follows multi-step commands with increased time ?Safety/Judgement: Decreased awareness of safety, Decreased awareness of deficits ?Awareness: Emergent ?Problem Solving: Difficulty sequencing, Requires verbal cues ?General Comments: L inattention ?  ?  ?   ?Exercises Exercises: Other exercises ?Other Exercises ?Other Exercises: provided pt with putty and fuctional fine motor tasks to do in the room. ? ?  ?Shoulder Instructions   ? ? ?  ?General Comments Pt very limited due to cognition, weakness in L side, poor coordination and poor proprioception.  All these deficits affect safety with adls and makes pt a fall risk.  ? ? ?Pertinent Vitals/ Pain       Pain Assessment ?Pain Assessment: Faces ?Faces Pain Scale: Hurts a little bit ?Pain Location: head ?Pain Descriptors / Indicators: Aching ?Pain Intervention(s): Monitored during session ? ?Home Living   ?  ?  ?  ?  ?  ?  ?  ?  ?  ?  ?  ?  ?  ?  ?  ?  ?  ?  ? ?  ?Prior Functioning/Environment    ?  ?  ?  ?   ? ?Frequency ? Min 3X/week  ? ? ? ? ?  ?Progress Toward Goals ? ?OT Goals(current goals can now be found in the care plan section) ? Progress towards OT goals: Progressing toward goals ? ?Acute Rehab OT Goals ?Patient Stated Goal: to go home ?OT Goal Formulation: With patient/family ?Time For Goal Achievement: 05/31/21 ?Potential to Achieve Goals: Good ?ADL Goals ?Pt Will Perform Lower Body Dressing: with supervision;sit to/from stand ?Pt Will Transfer to Toilet: with supervision;ambulating;regular height toilet ?Pt/caregiver will Perform Home Exercise Program: Increased strength;Increased ROM;With Supervision;With written HEP provided ?Additional ADL Goal #1: Pt will demonstrate increased problem  solving to perform simpel IADL with Min cues  ?Plan Discharge plan remains appropriate   ? ?Co-evaluation ? ? ?   ?  ?  ?  ?  ? ?  ?AM-PAC OT "6 Clicks" Daily Activity     ?Outcome Measure ? ? Help from another person eating meals?: None ?Help from another person taking care of personal grooming?: A Little ?Help from another person toileting, which includes using toliet, bedpan, or urinal?: A Little ?Help from another person bathing (including washing, rinsing, drying)?: A Little ?Help from another person to put on and taking off regular upper body clothing?: A Little ?Help from another person to put on and taking off regular lower body clothing?: A Little ?6 Click Score: 19 ? ?  ?End of Session   ? ?OT Visit Diagnosis: Unsteadiness on feet (R26.81);Other abnormalities of gait and mobility (R26.89);Muscle weakness (generalized) (M62.81) ?  ?Activity Tolerance Patient tolerated treatment well ?  ?Patient Left in chair;with call bell/phone within reach;with chair alarm set;with family/visitor present ?  ?Nurse Communication Mobility status ?  ? ?   ? ?Time: 7741-2878 ?OT Time Calculation (min): 36 min ? ?Charges: OT General Charges ?$OT Visit: 1 Visit ?OT Treatments ?$Self Care/Home Management : 23-37 mins ? ? ?Glenford Peers ?05/18/2021, 10:02 AM ?

## 2021-05-18 NOTE — TOC Transition Note (Signed)
Transition of Care (TOC) - CM/SW Discharge Note ? ? ?Patient Details  ?Name: KEYONTAE HUCKEBY ?MRN: [id number] ?Date of Birth: 1970/06/11 ? ?Transition of Care (TOC) CM/SW Contact:  ?Pollie Friar, RN ?Phone Number: ?05/18/2021, 9:45 AM ? ? ?Clinical Narrative:    ?Patient is discharging home with outpatient therapy through [facility]. Orders in Epic and information on the AVS. ?3 in 1 ordered through [place] and will be delivered to the room.  ?Pt has supervision at home through his spouse. She is also able to provide needed transportation.  ?Patients spouse denies any medication issues.  ? ? ?Final next level of care: OP Rehab ?Barriers to Discharge: No Barriers Identified ? ? ?Patient Goals and CMS Choice ?  ?CMS Medicare.gov Compare Post Acute Care list provided to:: Patient Represenative (must comment) ?Choice offered to / list presented to : Spouse ? ?Discharge Placement ?  ?           ?  ?  ?  ?  ? ?Discharge Plan and Services ?  ?  ?           ?DME Arranged: 3-N-1 ?DME Agency: [employer] ?Date DME Agency Contacted: 05/18/21 ?  ?Representative spoke with at DME Agency: [place] ?  ?  ?  ?  ?  ? ?Social Determinants of Health (SDOH) Interventions ?  ? ? ?Readmission Risk Interventions ?   ? View : No data to display.  ?  ?  ?  ? ? ? ? ? ?

## 2021-05-18 NOTE — Progress Notes (Signed)
Physical Therapy Treatment ?Patient Details ?Name: Jerry Richards ?MRN: [id number] ?DOB: [birth year] ?Today's Date: 05/18/2021 ? ? ?History of Present Illness 51 yo male with LLE weakness. MRI showing partially necrotic mass at the right frontoparietal vertex. S/p  Biparietal craniotomy and resection of tumor on 3/20. PMH including anxiety, a-fib, and L heart cath (2013). ? ?  ?PT Comments  ? ? Pt progressing well toward goals.  Still impulsive, but is safe enough with cuing of his wife.  Emphasis on safety with transfers, gait and stair training and overall gait quality improvements given no AFO at this point. ?  ?Recommendations for follow up therapy are one component of a multi-disciplinary discharge planning process, led by the attending physician.  Recommendations may be updated based on patient status, additional functional criteria and insurance authorization. ? ?Follow Up Recommendations ? Outpatient PT ?  ?  ?Assistance Recommended at Discharge Intermittent Supervision/Assistance  ?Patient can return home with the following A lot of help with walking and/or transfers;A lot of help with bathing/dressing/bathroom;Direct supervision/assist for medications management;Assist for transportation;Help with stairs or ramp for entrance;Direct supervision/assist for financial management ?  ?Equipment Recommendations ? Other (comment)  ?  ?Recommendations for Other Services   ? ? ?  ?Precautions / Restrictions Precautions ?Precautions: Fall ?Precaution Comments: L side ataxia ?Restrictions ?Weight Bearing Restrictions: No  ?  ? ?Mobility ? Bed Mobility ?  ?  ?  ?  ?  ?  ?  ?General bed mobility comments: OOB on arrival ?  ? ?Transfers ?Overall transfer level: Needs assistance ?Equipment used: Rolling walker (2 wheels) ?Transfers: Sit to/from Stand ?Sit to Stand: Min assist ?  ?  ?  ?  ?  ?General transfer comment: assist for balance, and cues for hand placement ?  ? ?Ambulation/Gait ?Ambulation/Gait assistance: Mod  assist ?Gait Distance (Feet): 300 Feet ?Assistive device: Rolling walker (2 wheels) ?Gait Pattern/deviations: Step-through pattern, Knee hyperextension - left, Decreased dorsiflexion - left, Ataxic, Wide base of support ?Gait velocity: ambulates faster than can be controlled ?Gait velocity interpretation: 1.31 - 2.62 ft/sec, indicative of limited community ambulator ?  ?General Gait Details: Poorly controlled gait overall.  Need for some RW assist, truncal stability and postural cues.  Pt tending to move faster than he can control without cues to slow somewhat for control.  Shoes and AFO should improve things significantly, but impulsivity plays a part. ? ? ?Stairs ?Stairs: Yes ?Stairs assistance: Mod assist ?Stair Management: One rail Right, One rail Left, Step to pattern, Forwards ?Number of Stairs: 10 ?General stair comments: pt needs cuing throughout the whole stair task and assist in addition to the rail for safety.  Pt forgets to stay with a step to pattern unless consistently cued. ? ? ?Wheelchair Mobility ?  ? ?Modified Rankin (Stroke Patients Only) ?  ? ? ?  ?Balance Overall balance assessment: Needs assistance ?Sitting-balance support: No upper extremity supported ?Sitting balance-Leahy Scale: Fair ?Sitting balance - Comments: able to assist to don shoe in sitting when L leg crossed over knee ?  ?Standing balance support: Bilateral upper extremity supported ?Standing balance-Leahy Scale: Poor ?Standing balance comment: Pt must have outside support to stand. ?  ?  ?  ?  ?  ?  ?  ?  ?  ?  ?  ?  ? ?  ?Cognition Arousal/Alertness: Awake/alert ?Behavior During Therapy: Impulsive ?Overall Cognitive Status: Impaired/Different from baseline ?Area of Impairment: Attention, Memory, Following commands, Safety/judgement, Awareness, Problem solving ?  ?  ?  ?  ?  ?  ?  ?  ?  ?  Current Attention Level: Selective ?Memory: Decreased short-term memory ?Following Commands: Follows one step commands consistently, Follows  multi-step commands with increased time ?Safety/Judgement: Decreased awareness of safety, Decreased awareness of deficits ?Awareness: Emergent ?Problem Solving: Difficulty sequencing, Requires verbal cues ?General Comments: L inattention ?  ?  ? ?  ?Exercises   ? ?  ?General Comments General comments (skin integrity, edema, etc.): wife present and participative with gait and stair training. ?  ?  ? ?Pertinent Vitals/Pain Pain Assessment ?Pain Assessment: Faces ?Faces Pain Scale: Hurts a little bit ?Pain Location: head, L leg/foot ?Pain Descriptors / Indicators: Aching ?Pain Intervention(s): Monitored during session  ? ? ?Home Living   ?  ?  ?  ?  ?  ?  ?  ?  ?  ?   ?  ?Prior Function    ?  ?  ?   ? ?PT Goals (current goals can now be found in the care plan section) Acute Rehab PT Goals ?PT Goal Formulation: With patient/family ?Time For Goal Achievement: 05/31/21 ?Potential to Achieve Goals: Good ?Progress towards PT goals: Progressing toward goals ? ?  ?Frequency ? ? ? Min 4X/week ? ? ? ?  ?PT Plan Current plan remains appropriate  ? ? ?Co-evaluation   ?  ?  ?  ?  ? ?  ?AM-PAC PT "6 Clicks" Mobility   ?Outcome Measure ? Help needed turning from your back to your side while in a flat bed without using bedrails?: A Little ?Help needed moving from lying on your back to sitting on the side of a flat bed without using bedrails?: A Little ?Help needed moving to and from a bed to a chair (including a wheelchair)?: A Lot ?Help needed standing up from a chair using your arms (e.g., wheelchair or bedside chair)?: A Lot ?Help needed to walk in hospital room?: A Lot ?Help needed climbing 3-5 steps with a railing? : Total ?6 Click Score: 13 ? ?  ?End of Session   ?Activity Tolerance: Patient limited by fatigue;Patient tolerated treatment well ?Patient left: in chair;with call bell/phone within reach;with family/visitor present ?Nurse Communication: Mobility status ?PT Visit Diagnosis: Other abnormalities of gait and mobility  (R26.89);Other symptoms and signs involving the nervous system (R29.898);Hemiplegia and hemiparesis ?Hemiplegia - Right/Left: Left ?Hemiplegia - dominant/non-dominant: Non-dominant ?Hemiplegia - caused by: Unspecified ?  ? ? ?Time: 2703-5009 ?PT Time Calculation (min) (ACUTE ONLY): 39 min ? ?Charges:  $Gait Training: 8-22 mins ?$Therapeutic Activity: 23-37 mins          ?          ? ?05/18/2021 ? ?Ginger Carne., PT ?Acute Rehabilitation Services ?[phone]  (pager) ?[phone]  (office) ? ? ?Tessie Fass Taneika Choi ?05/18/2021, 11:26 AM ? ?

## 2021-05-20 ENCOUNTER — Ambulatory Visit: Payer: BC Managed Care – PPO | Admitting: Physical Therapy

## 2021-05-20 ENCOUNTER — Ambulatory Visit: Payer: BC Managed Care – PPO | Attending: Neurosurgery | Admitting: Occupational Therapy

## 2021-05-20 ENCOUNTER — Encounter: Payer: Self-pay | Admitting: Physical Therapy

## 2021-05-20 ENCOUNTER — Other Ambulatory Visit: Payer: Self-pay | Admitting: Neurology

## 2021-05-20 ENCOUNTER — Other Ambulatory Visit: Payer: Self-pay

## 2021-05-20 DIAGNOSIS — Z483 Aftercare following surgery for neoplasm: Secondary | ICD-10-CM | POA: Diagnosis present

## 2021-05-20 DIAGNOSIS — R2689 Other abnormalities of gait and mobility: Secondary | ICD-10-CM

## 2021-05-20 DIAGNOSIS — R2681 Unsteadiness on feet: Secondary | ICD-10-CM

## 2021-05-20 DIAGNOSIS — R29818 Other symptoms and signs involving the nervous system: Secondary | ICD-10-CM | POA: Diagnosis present

## 2021-05-20 DIAGNOSIS — I69954 Hemiplegia and hemiparesis following unspecified cerebrovascular disease affecting left non-dominant side: Secondary | ICD-10-CM | POA: Insufficient documentation

## 2021-05-20 DIAGNOSIS — R41842 Visuospatial deficit: Secondary | ICD-10-CM | POA: Diagnosis present

## 2021-05-20 DIAGNOSIS — R279 Unspecified lack of coordination: Secondary | ICD-10-CM | POA: Diagnosis present

## 2021-05-20 DIAGNOSIS — M6281 Muscle weakness (generalized): Secondary | ICD-10-CM | POA: Diagnosis present

## 2021-05-20 DIAGNOSIS — R41840 Attention and concentration deficit: Secondary | ICD-10-CM | POA: Diagnosis present

## 2021-05-20 DIAGNOSIS — R260 Ataxic gait: Secondary | ICD-10-CM | POA: Insufficient documentation

## 2021-05-20 DIAGNOSIS — M21372 Foot drop, left foot: Secondary | ICD-10-CM

## 2021-05-20 DIAGNOSIS — R41844 Frontal lobe and executive function deficit: Secondary | ICD-10-CM | POA: Diagnosis present

## 2021-05-20 NOTE — Progress Notes (Signed)
Patient with high-grade astrocytoma.  Final pathology pending. ?

## 2021-05-20 NOTE — Therapy (Signed)
?OUTPATIENT PHYSICAL THERAPY NEURO EVALUATION ? ? ?Patient Name: Jerry Richards ?MRN: [id number] ?DOB:01-08-1971, 51 y.o., male ?Today's Date: 05/20/2021 ? ?PCP: [name], MD ?REFERRING PROVIDER: Earnie Larsson, MD ? ? PT End of Session - 05/20/21 1413   ? ? Visit Number 1   ? Number of Visits 17   16+eval  ? Date for PT Re-Evaluation 07/15/21   ? Authorization Type BCBS [id number]  30% coinsur   ? PT Start Time 1400   ? PT Stop Time 0947   ? PT Time Calculation (min) 45 min   ? Equipment Utilized During Treatment Gait belt   ? Activity Tolerance Patient tolerated treatment well   ? Behavior During Therapy [facility] for tasks assessed/performed;Restless;Impulsive   ? ?  ?  ? ?  ? ? ?Past Medical History:  ?Diagnosis Date  ? Anxiety   ? Atrial fibrillation ([place])   ? a. s/p RFCA x 2 (10/26/2011, 04/15/2012)  ? Atrial flutter ([place])   ? Chronic kidney disease   ? Clotting disorder ([place])   ? Colitis   ? Current smoker   ? Diverticulitis   ? 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 LA thrombus   ? Non-ischemic cardiomyopathy ([place])   ? OSA on CPAP   ? Sinus trouble   ? Sleep apnea   ? Systolic heart failure ([place])   ? EF 20-25%-> 50-55% in 10/2011. MOST RECENT ECHO (03/2012) EF= 66%  ? ?Past Surgical History:  ?Procedure Laterality Date  ? APPLICATION OF CRANIAL NAVIGATION Right 05/16/2021  ? Procedure: APPLICATION OF CRANIAL NAVIGATION;  Surgeon: Earnie Larsson, MD;  Location: [place];  Service: Neurosurgery;  Laterality: Right;  ? ATRIAL ABLATION SURGERY    ? CARDIAC CATHETERIZATION  08/2011  ? [facility]  ? CHOLECYSTECTOMY    ? CRANIOTOMY Right 05/16/2021  ? Procedure: Craniotomy - right - Parietal with  brainlab;  Surgeon: Earnie Larsson, MD;  Location: [place];  Service: Neurosurgery;  Laterality: Right;  ? FINGER SURGERY    ? LEFT HEART CATHETERIZATION WITH CORONARY ANGIOGRAM N/A 09/12/2011  ? Procedure: LEFT HEART CATHETERIZATION WITH CORONARY ANGIOGRAM;  Surgeon: Hillary Bow, MD;  Location: [facility] CATH LAB;   Service: Cardiovascular;  Laterality: N/A;  ? VASECTOMY    ? ?Patient Active Problem List  ? Diagnosis Date Noted  ? Brain tumor ([place]) 05/16/2021  ? LLQ abdominal pain 08/13/2020  ? Diverticulitis of colon 08/13/2020  ? History of colonic polyps 08/13/2020  ? Sinus node dysfunction ([place]) 02/11/2014  ? Pulmonary vein stenosis 06/30/2013  ? S/P ablation of atrial fibrillation 02/25/2013  ? 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 Tobacco abuse 09/02/2012  ? Hyperthyroidism 09/02/2012  ? Palpitations 07/09/2012  ? Chronic anticoagulation 02/09/2012  ? Coronary artery disease excluded 02/09/2012  ? History of radiofrequency ablation for complex left atrial arrhythmia 02/09/2012  ? Atrial fibrillation with slow ventricular response ([place]) 02/09/2012  ? Tachycardia induced cardiomyopathy ([place]) 02/09/2012  ? Persistent atrial fibrillation ([place]) 10/26/2011  ? Sleep apnea 10/26/2011  ? Non-ischemic cardiomyopathy ([place]) 10/26/2011  ? Atrial fibrillation ([place]) 08/21/2011  ? Tachycardia induced cardiomyopathy ([place]) 08/21/2011  ? Atrial flutter ([place]) 08/21/2011  ? Obstructive sleep apnea 08/21/2011  ? FH: coronary artery disease 08/21/2011  ? Left atrial thrombus 06/10/2011  ? ? ?ONSET DATE: 05/18/2021  ? ?REFERRING DIAG: D49.6 (ICD-10-CM) - Brain tumor ([place])  ? ?THERAPY DIAG:  ?Foot drop, left ? ?Ataxic gait ? ?Aftercare following  surgery for neoplasm ? ?Unspecified lack of coordination ? ?Other abnormalities of gait and mobility ? ?Unsteadiness on feet ? ?SUBJECTIVE:  ?                                                                                                                                                                                           ? ?SUBJECTIVE STATEMENT: ?Rolled L ankle around Christmas 2022.  03/05/2021 he began having dropfoot which quickly progressed to difficulty walking and numbness down his L side.  Day before surgery he lost all function of left side. ?Pt is awaiting staging for tumor  and found out yesterday about pathology results.  He did not sleep much last night because of being upset and sharing news with family. He just recently woke up from a nap and feels "off".   ?Pt accompanied by: self and significant other-[name] ? ?PERTINENT HISTORY: Per ED notes:  Pt presented to [facility] ED on 05/08/2021 for progressive L sided weakness and concerns over progressively worsening symptoms including left leg spasms, difficulty typing with his left hand, left face numbness.  He returned 05/09/2021 for follow-up MRI due to CT findings.  Pt underwent right craniotomy 05/16/2021 for biparietal craniotomy and resection of tumor using intraoperative stereotactic guidance.   ? ?PMH:  hypothyroidism, cardiomyopathy, CAD, OSA, atrial flutter anticoagulated on Xarelto  ? ?PAIN:  ?Are you having pain? No ? ?PRECAUTIONS: Fall ? ?WEIGHT BEARING RESTRICTIONS No ? ?FALLS: Has patient fallen in last 6 months? Yes, Number of falls: 2 ?Hooking up a trailer when he fell, L ankle would roll. ? ?LIVING ENVIRONMENT: ?Lives with: lives with their family and lives with their spouse ?Lives in: House/apartment ?Stairs: Yes: Internal: 12 steps; on right going up, on left going up, and bilateral but cannot reach both and External: 7 steps; on right going up, on left going up, and can reach both-has been going upstairs without issue w/ wife ?Has following equipment at home: Single point cane, Quad cane large base, Walker - 2 wheeled, and Shower bench ? 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  He was climbing ladders and things, not well, until surgery. ?PATIENT GOALS "I want to be normal as long as I can."  He would like to return to work. ? ?OBJECTIVE:  ? ?DIAGNOSTIC FINDINGS: Head CT showed a paramedian mass at the right vertex, abutting the falx and MRI showed right parasagittal likely extra-axial tumor.  2.9 x 2.0 x 2.6 cm mass at the medial right frontoparietal vertex.   Post-resection  MRI of brain from 05/17/2021 shows vasogenic edema surrounding resection  cavity without midline shift.  No acute infarct.  Concern for resection margins. ? ?COGNITION: ?Overall cognitive status:  labile, pt is good historian -information verified by wife, easily frustrated due to physical deficits, distractible to light and sound, impulsive ?  ?SENSATION: ?Light touch: Impaired  ?Proprioception: WFL ?Left hemibody numbness, "white noise", Worst at ankle to toes ?COORDINATION: ?Impaired Heel-to-shin ?Impaired LE RAMPS ? ?EDEMA:  ?None noted. ? ?MUSCLE TONE: None noted. ? ?DTRs:  ?Patella 2+ = Normal ? ?POSTURE: No Significant postural limitations ? ?LE ROM:    ? ?Active  Right ?05/20/2021 Left ?05/20/2021  ?Hip flexion [facility] WFL  ?Hip extension    ?Hip abduction WFL Mid range  ?Hip adduction [facility] WFL  ?Hip internal rotation    ?Hip external rotation    ?Knee flexion [facility] WFL  ?Knee extension [facility] WFL  ?Ankle dorsiflexion WFL neutral  ?Ankle plantarflexion WFL 20 deg  ?Ankle inversion    ?Ankle eversion    ? (Blank rows = not tested) ? ?MMT:   ? ?MMT Right ?05/20/2021 Left ?05/20/2021  ?Hip flexion 5/5 2+/5  ?Hip extension    ?Hip abduction 5/5 2/5  ?Hip adduction 5/5 3/5  ?Hip internal rotation    ?Hip external rotation    ?Knee flexion 5/5 2+/5  ?Knee extension 5/5 3+/5  ?Ankle dorsiflexion 5/5 1/5  ?Ankle plantarflexion 5/5 2/5  ?Ankle inversion    ?Ankle eversion    ?(Blank rows = not tested) ? ?TRANSFERS: ?Assistive device utilized: Environmental consultant - 2 wheeled  ?Sit to stand: CGA and Min A ?Stand to sit: CGA and Min A ?Chair to chair: CGA ? ?GAIT: ?Gait pattern:  Inc UE use on AD, step to pattern, decreased step length- Left, decreased hip/knee flexion- Left, decreased ankle dorsiflexion- Left, circumduction- Left, Left hip hike, genu recurvatum- Left, ataxic, decreased trunk rotation, and poor foot clearance- Left ?Distance walked: 100' ?Assistive device utilized: Environmental consultant - 2 wheeled ?Level of assistance: CGA and  Min A ?Comments: Pt counts step out loud to focus on task, quickly fatigues at left ankle unable to maintain neutral DF to clear foot, catches toe due to foot drop x2.  Pt attempts to place heel first with mild s

## 2021-05-20 NOTE — Therapy (Signed)
?OUTPATIENT OCCUPATIONAL THERAPY NEURO EVALUATION ? ?Patient Name: Jerry Richards ?MRN: [id number] ?DOB:09/23/1970, 51 y.o., male ?Today's Date: 05/20/2021 ? ?PCP: [name], MD ?REFERRING PROVIDER: Earnie Larsson, MD ? ? OT End of Session - 05/20/21 1411   ? ? Visit Number 1   ? Number of Visits 17   ? Date for OT Re-Evaluation 07/29/21   ? Authorization Type BCBS   ? Authorization Time Period VL:[place]  No Auth   ? OT Start Time 0865   ? OT Stop Time 1530   ? OT Time Calculation (min) 45 min   ? Activity Tolerance Patient tolerated treatment well   ? Behavior During Therapy Impulsive;WFL for tasks assessed/performed   ? ?  ?  ? ?  ? ? ?Past Medical History:  ?Diagnosis Date  ? Anxiety   ? Atrial fibrillation ([place])   ? a. s/p RFCA x 2 (10/26/2011, 04/15/2012)  ? Atrial flutter ([place])   ? Chronic kidney disease   ? Clotting disorder ([place])   ? Colitis   ? Current smoker   ? Diverticulitis   ? 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 LA thrombus   ? Non-ischemic cardiomyopathy ([place])   ? OSA on CPAP   ? Sinus trouble   ? Sleep apnea   ? Systolic heart failure ([place])   ? EF 20-25%-> 50-55% in 10/2011. MOST RECENT ECHO (03/2012) EF= 66%  ? ?Past Surgical History:  ?Procedure Laterality Date  ? APPLICATION OF CRANIAL NAVIGATION Right 05/16/2021  ? Procedure: APPLICATION OF CRANIAL NAVIGATION;  Surgeon: Earnie Larsson, MD;  Location: [facility];  Service: Neurosurgery;  Laterality: Right;  ? ATRIAL ABLATION SURGERY    ? CARDIAC CATHETERIZATION  08/2011  ? [facility]  ? CHOLECYSTECTOMY    ? CRANIOTOMY Right 05/16/2021  ? Procedure: Craniotomy - right - Parietal with  brainlab;  Surgeon: Earnie Larsson, MD;  Location: [facility];  Service: Neurosurgery;  Laterality: Right;  ? FINGER SURGERY    ? LEFT HEART CATHETERIZATION WITH CORONARY ANGIOGRAM N/A 09/12/2011  ? Procedure: LEFT HEART CATHETERIZATION WITH CORONARY ANGIOGRAM;  Surgeon: Hillary Bow, MD;  Location: [facility] CATH LAB;  Service: Cardiovascular;   Laterality: N/A;  ? VASECTOMY    ? ?Patient Active Problem List  ? Diagnosis Date Noted  ? Brain tumor ([place]) 05/16/2021  ? LLQ abdominal pain 08/13/2020  ? Diverticulitis of colon 08/13/2020  ? History of colonic polyps 08/13/2020  ? Sinus node dysfunction ([place]) 02/11/2014  ? Pulmonary vein stenosis 06/30/2013  ? S/P ablation of atrial fibrillation 02/25/2013  ? 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 Tobacco abuse 09/02/2012  ? Hyperthyroidism 09/02/2012  ? Palpitations 07/09/2012  ? Chronic anticoagulation 02/09/2012  ? Coronary artery disease excluded 02/09/2012  ? History of radiofrequency ablation for complex left atrial arrhythmia 02/09/2012  ? Atrial fibrillation with slow ventricular response ([place]) 02/09/2012  ? Tachycardia induced cardiomyopathy ([place]) 02/09/2012  ? Persistent atrial fibrillation ([place]) 10/26/2011  ? Sleep apnea 10/26/2011  ? Non-ischemic cardiomyopathy ([place]) 10/26/2011  ? Atrial fibrillation ([place]) 08/21/2011  ? Tachycardia induced cardiomyopathy ([place]) 08/21/2011  ? Atrial flutter ([place]) 08/21/2011  ? Obstructive sleep apnea 08/21/2011  ? FH: coronary artery disease 08/21/2011  ? Left atrial thrombus 06/10/2011  ? ? ?ONSET DATE: 05/18/2021 - tumor ressection 05/16/21 ? ?REFERRING DIAG: D49.6 (ICD-10-CM) - Brain tumor ([place])  ? ?THERAPY DIAG:  ?Muscle weakness (generalized) ? ?Unsteadiness on feet ? ?Other symptoms and signs  involving the nervous system ? ?Hemiplegia and hemiparesis following unspecified cerebrovascular disease affecting left non-dominant side ([place]) ? ?Attention and concentration deficit ? ?Frontal lobe and executive function deficit ? ?Visuospatial deficit ? ?SUBJECTIVE:  ? ?SUBJECTIVE STATEMENT: ?PT is a 51 year old male that presents to Neuro OPOT s/p biparietal craniotomy and resection of tumor on 05/16/21. ?Pt accompanied by: significant other ? ?PERTINENT HISTORY: Biparietal craniotomy and resection of tumor on 3/20. PMH including anxiety, a-fib, and L heart  cath (2013).  ? ?PRECAUTIONS: Fall and Other: L Sided ataxia ? ?WEIGHT BEARING RESTRICTIONS No ? ?PAIN:  ?Are you having pain? Yes: NPRS scale: 6/10 ?Pain location: headache ?Pain description: squeezing ?Aggravating factors:   ?Relieving factors: pain meds ? ?FALLS: Has patient fallen in last 6 months? Yes, Number of falls: 1 ? ?LIVING ENVIRONMENT: ?Lives with: lives with their spouse, cat and dog ?Lives in: House/apartment ?Stairs: 2 level house and bed/bath upstairs, stairs outside ?Has following equipment at home: Gilford Rile - 2 wheeled and shower chair - walk in shower ? ? 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 ?PATIENT GOALS "I wanna be back to normal" ? ?OBJECTIVE:  ? ? 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 ?Plan biparietal craniotomy and resection of tumor.  ? ?HAND DOMINANCE: Right ? ?ADLs: ?Overall ADLs: Pt req's assistance for safety for all ADLs. ?Transfers/ambulation related to ADLs: ?Eating: Supervision for safety ?Grooming: Independent ?UB Dressing: Set up assistance, Supervision ?LB Dressing: Min A for stability and clothing management ?Toileting: Supervision for safety d/t stability  ?Bathing: Min A  ?Tub Shower transfers: Min A ?Equipment: Shower seat with back and Walk in shower ? ? ?IADLs: ?Shopping: Not completing at this time. ?Light housekeeping: Did dishes and laundry prior. Not completing now. ?Meal Prep: did not cook prior - could prepare sandwiches. Not currently completing.  ?Community mobility: Relying family for driving ?Medication management: Spouse managing ?Financial management: Assistance req'd ?Handwriting:  reports no changes and writing normal ? ?MOBILITY STATUS: Needs Assist: ambulating with 2wRW and with CGA ? ?POSTURE COMMENTS:  ?No Significant postural limitations ? ? ?UE ROM   BUE WFL however limited strength in  LUE. ? ? ? ?UE MMT:   RUE WFL, LUE Deficits ? ?MMT Right ?05/20/2021 Left ?05/20/2021  ?Shoulder flexion  3+/5  ?Shoulder abduction  3+/5  ?Elbow flexion  4-/5  ?Elbow extension  4-/5  ?(Blank rows = not tested) ? ?HAND FUNCTION: ?Grip strength: Right: 131.3 lbs; Left: 120.5 lbs ? ?COORDINATION: ?Finger Nose Finger test: L with some dysmetria ?9 Hole Peg test: Right: 26.84 sec; Left: 74.20 sec ?Box and Blocks:  Right 52 blocks, Left 38 blocks ?Poor proprioception and spatial awareness with LUE ? ?SENSATION: ?Light touch: WFL ?Proprioception: Impaired  ? ?EDEMA: None noted ? ?MUSCLE TONE: LUE: Within functional limits ? ?COGNITION: ?Overall cognitive status: Impaired: Areas of impairment: Attention, Following commands, Safety/judgement, Awareness, and Problem solving  ?Attention: Deficits Completed 88's with 2 passes on sheet with 1 error. Pt rushed through but used organized scanning pattern. ?Awareness: Deficits poor self awareness into deficits ?Problem solving: Deficits   ?Behavior: Impulsive and tangential, hyperverbal, pressed speech ? ?VISION: ?Subjective report: Pt wore glasses for screens prior ?Baseline vision: No visual deficits ?Visual history:    ? ?VISION ASSESSMENT: ?Denies any changes. None noted at evaluation. ? ? ?PERCEPTION: Impaired: Inattention/neglect: does not attend to left side of body ? ?PRAXIS: Impaired: Motor planning ? ? ? ? ?  TODAY'S TREATMENT:  ?05/20/21 No treatment today. ? ? ?PATIENT EDUCATION: ?Education details: Educated on role and purpose of OT. Potential goals. ?Person educated: Patient and Spouse ?Education method: Explanation ?Education comprehension: verbalized understanding ? ? ? ? ? ?GOALS: ?Goals reviewed with patient? No ? ?SHORT TERM GOALS: Target date: 06/17/2021 ? ?Pt will be independent with HEP targeting coordination and proximal strength in LUE ?Baseline:  ?Goal status: INITIAL ? ?2.  Pt will improve functional gross motor coordination with LUE by increasing Box and  Blocks score by 3 blocks or more. ?Baseline: 28 blocks LUE, 52 RUE ?Goal status: INITIAL ? ?3.  Pt will increase dynamic balance and safety with standing task for increasing independence with ADLs to set up and distant supe

## 2021-05-23 NOTE — Progress Notes (Signed)
Both chronic kidney disease and cardiovascular standpoint are unknown to me. ?

## 2021-05-25 ENCOUNTER — Ambulatory Visit: Payer: BC Managed Care – PPO | Admitting: Physical Therapy

## 2021-05-25 ENCOUNTER — Encounter: Payer: Self-pay | Admitting: Occupational Therapy

## 2021-05-25 ENCOUNTER — Ambulatory Visit: Payer: BC Managed Care – PPO | Admitting: Occupational Therapy

## 2021-05-25 ENCOUNTER — Telehealth: Payer: Self-pay | Admitting: Neurology

## 2021-05-25 ENCOUNTER — Encounter: Payer: Self-pay | Admitting: Physical Therapy

## 2021-05-25 DIAGNOSIS — R29818 Other symptoms and signs involving the nervous system: Secondary | ICD-10-CM

## 2021-05-25 DIAGNOSIS — I69954 Hemiplegia and hemiparesis following unspecified cerebrovascular disease affecting left non-dominant side: Secondary | ICD-10-CM

## 2021-05-25 DIAGNOSIS — R2689 Other abnormalities of gait and mobility: Secondary | ICD-10-CM

## 2021-05-25 DIAGNOSIS — Z483 Aftercare following surgery for neoplasm: Secondary | ICD-10-CM

## 2021-05-25 DIAGNOSIS — R260 Ataxic gait: Secondary | ICD-10-CM

## 2021-05-25 DIAGNOSIS — R41840 Attention and concentration deficit: Secondary | ICD-10-CM

## 2021-05-25 DIAGNOSIS — R2681 Unsteadiness on feet: Secondary | ICD-10-CM

## 2021-05-25 DIAGNOSIS — M6281 Muscle weakness (generalized): Secondary | ICD-10-CM | POA: Diagnosis not present

## 2021-05-25 DIAGNOSIS — R41842 Visuospatial deficit: Secondary | ICD-10-CM

## 2021-05-25 DIAGNOSIS — M21372 Foot drop, left foot: Secondary | ICD-10-CM

## 2021-05-25 DIAGNOSIS — R279 Unspecified lack of coordination: Secondary | ICD-10-CM

## 2021-05-25 DIAGNOSIS — R41844 Frontal lobe and executive function deficit: Secondary | ICD-10-CM

## 2021-05-25 NOTE — Therapy (Signed)
?OUTPATIENT PHYSICAL THERAPY TREATMENT NOTE ? ? ?Patient Name: Jerry Richards ?MRN: [id number] ?DOB:1970-09-26, 51 y.o., male ?Today's Date: 05/25/2021 ? ?PCP: [name], MD ?REFERRING PROVIDER: [name], MD ? ? PT End of Session - 05/25/21 1318   ? ? Visit Number 2   ? Number of Visits 17   ? Date for PT Re-Evaluation 07/15/21   ? Authorization Type BCBS 2023  30% coinsur   ? PT Start Time 1315   ? Equipment Utilized During Treatment Gait belt   ? Activity Tolerance Patient tolerated treatment well   ? Behavior During Therapy [facility] for tasks assessed/performed;Restless;Impulsive   ? ?  ?  ? ?  ? ? ?Past Medical History:  ?Diagnosis Date  ? Anxiety   ? Atrial fibrillation ([place])   ? a. s/p RFCA x 2 (10/26/2011, 04/15/2012)  ? Atrial flutter ([place])   ? Chronic kidney disease   ? Clotting disorder ([place])   ? Colitis   ? Current smoker   ? Diverticulitis   ? 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 LA thrombus   ? Non-ischemic cardiomyopathy ([place])   ? OSA on CPAP   ? Sinus trouble   ? Sleep apnea   ? Systolic heart failure ([place])   ? EF 20-25%-> 50-55% in 10/2011. MOST RECENT ECHO (03/2012) EF= 66%  ? ?Past Surgical History:  ?Procedure Laterality Date  ? APPLICATION OF CRANIAL NAVIGATION Right 05/16/2021  ? Procedure: APPLICATION OF CRANIAL NAVIGATION;  Surgeon: Earnie Larsson, MD;  Location: [facility];  Service: Neurosurgery;  Laterality: Right;  ? ATRIAL ABLATION SURGERY    ? CARDIAC CATHETERIZATION  08/2011  ? [facility]  ? CHOLECYSTECTOMY    ? CRANIOTOMY Right 05/16/2021  ? Procedure: Craniotomy - right - Parietal with  brainlab;  Surgeon: Earnie Larsson, MD;  Location: [facility];  Service: Neurosurgery;  Laterality: Right;  ? FINGER SURGERY    ? LEFT HEART CATHETERIZATION WITH CORONARY ANGIOGRAM N/A 09/12/2011  ? Procedure: LEFT HEART CATHETERIZATION WITH CORONARY ANGIOGRAM;  Surgeon: Hillary Bow, MD;  Location: [facility] CATH LAB;  Service: Cardiovascular;  Laterality: N/A;  ? VASECTOMY     ? ?Patient Active Problem List  ? Diagnosis Date Noted  ? Brain tumor ([place]) 05/16/2021  ? LLQ abdominal pain 08/13/2020  ? Diverticulitis of colon 08/13/2020  ? History of colonic polyps 08/13/2020  ? Sinus node dysfunction ([place]) 02/11/2014  ? Pulmonary vein stenosis 06/30/2013  ? S/P ablation of atrial fibrillation 02/25/2013  ? 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 Tobacco abuse 09/02/2012  ? Hyperthyroidism 09/02/2012  ? Palpitations 07/09/2012  ? Chronic anticoagulation 02/09/2012  ? Coronary artery disease excluded 02/09/2012  ? History of radiofrequency ablation for complex left atrial arrhythmia 02/09/2012  ? Atrial fibrillation with slow ventricular response ([place]) 02/09/2012  ? Tachycardia induced cardiomyopathy ([place]) 02/09/2012  ? Persistent atrial fibrillation ([place]) 10/26/2011  ? Sleep apnea 10/26/2011  ? Non-ischemic cardiomyopathy ([place]) 10/26/2011  ? Atrial fibrillation ([place]) 08/21/2011  ? Tachycardia induced cardiomyopathy ([place]) 08/21/2011  ? Atrial flutter ([employer]) 08/21/2011  ? Obstructive sleep apnea 08/21/2011  ? FH: coronary artery disease 08/21/2011  ? Left atrial thrombus 06/10/2011  ? ? ?REFERRING DIAG: D49.6 (ICD-10-CM) - Brain tumor ([place])  ? ?THERAPY DIAG:  ?Foot drop, left ? ?Ataxic gait ? ?Aftercare following surgery for neoplasm ? ?Unspecified lack of coordination ? ?Other abnormalities of gait and mobility ? ?PERTINENT HISTORY: Per ED notes:  Pt presented to [facility] ED on 05/08/2021 for progressive L sided weakness and concerns over progressively worsening symptoms including left leg spasms, difficulty typing with his left hand, left face numbness.  He returned 05/09/2021 for follow-up MRI due to CT findings.  Pt underwent right craniotomy 05/16/2021 for biparietal craniotomy and resection of tumor using intraoperative stereotactic guidance. PMH:  hypothyroidism, cardiomyopathy, CAD, OSA, atrial flutter anticoagulated on Xarelto   ? ?PRECAUTIONS: Fall ? ?SUBJECTIVE: Pt states  he is still waiting on staging for cancer which continues to be challenging emotionally. L arm is starting to improve in terms of dexterity.  He feels he overdid it with walking 1000 steps yesterday and feels it on left side of trunk.  He is having tingling in bottom and LLE.  His toes are curling more and require more effort to uncurl first thing in the morning. ? ?PAIN:  ?Are you having pain? No- he states he has a mild headache 1.5/10 ? ?Trialed L [place] AFO [id number]' w/ improved foot clearance, pt has mild knee hyperextension, but when cued to dec speed quad control improves. ?Assessed FGA- used RW, used L AFO =High Fall Risk ? [facility] PT Assessment - 05/25/21 1344   ? ?  ? Functional Gait  Assessment  ? Gait assessed  Yes   ? Gait Level Surface Walks 20 ft in less than 7 sec but greater than 5.5 sec, uses assistive device, slower speed, mild gait deviations, or deviates 6-10 in outside of the 12 in walkway width.   ? Change in Gait Speed Able to change speed, demonstrates mild gait deviations, deviates 6-10 in outside of the 12 in walkway width, or no gait deviations, unable to achieve a major change in velocity, or uses a change in velocity, or uses an assistive device.   ? Gait with Horizontal Head Turns Performs head turns smoothly with slight change in gait velocity (eg, minor disruption to smooth gait path), deviates 6-10 in outside 12 in walkway width, or uses an assistive device.   ? Gait with Vertical Head Turns Performs task with slight change in gait velocity (eg, minor disruption to smooth gait path), deviates 6 - 10 in outside 12 in walkway width or uses assistive device   ? Gait and Pivot Turn Pivot turns safely in greater than 3 sec and stops with no loss of balance, or pivot turns safely within 3 sec and stops with mild imbalance, requires small steps to catch balance.   ? Step Over Obstacle Is able to step over one shoe box (4.5 in total height) but must slow down and adjust steps to clear box  safely. May require verbal cueing.   uses RW  ? Gait with Narrow Base of Support Ambulates less than 4 steps heel to toe or cannot perform without assistance.   use // bars for support >7 steps  ? Gait with Eyes Closed Walks 20 ft, uses assistive device, slower speed, mild gait deviations, deviates 6-10 in outside 12 in walkway width. Ambulates 20 ft in less than 9 sec but greater than 7 sec.   RW  ? Ambulating Backwards Walks 20 ft, slow speed, abnormal gait pattern, evidence for imbalance, deviates 10-15 in outside 12 in walkway width.   ? Steps Two feet to a stair, must use rail.   ? Total Score 15   ? ?  ?  ? ?  ? ? ?Initiated HEP.  See below. ? ?OBJECTIVE:  ?  ?DIAGNOSTIC FINDINGS: Head CT  showed a paramedian mass at the right vertex, abutting the falx and MRI showed right parasagittal likely extra-axial tumor.  2.9 x 2.0 x 2.6 cm mass at the medial right frontoparietal vertex.  Post-resection  MRI of brain from 05/17/2021 shows vasogenic edema surrounding resection cavity without midline shift.  No acute infarct.  Concern for resection margins. ?  ?COGNITION: ?Overall cognitive status:  labile, pt is good historian -information verified by wife, easily frustrated due to physical deficits, distractible to light and sound, impulsive ?            ?SENSATION: ?Light touch: Impaired  ?Proprioception: WFL ?Left hemibody numbness, "white noise", Worst at ankle to toes ?COORDINATION: ?Impaired Heel-to-shin ?Impaired LE RAMPS ?  ?EDEMA:  ?None noted. ?  ?MUSCLE TONE: None noted. ?  ?DTRs:  ?Patella 2+ = Normal ?  ?POSTURE: No Significant postural limitations ?  ?LE ROM:    ?  ?Active  Right ?05/20/2021 Left ?05/20/2021  ?Hip flexion [facility] WFL  ?Hip extension      ?Hip abduction WFL Mid range  ?Hip adduction [facility] WFL  ?Hip internal rotation      ?Hip external rotation      ?Knee flexion [facility] WFL  ?Knee extension [facility] WFL  ?Ankle dorsiflexion WFL neutral  ?Ankle plantarflexion WFL 20 deg  ?Ankle inversion      ?Ankle eversion       ? (Blank rows = not tested) ?  ?MMT:   ?  ?MMT Right ?05/20/2021 Left ?05/20/2021  ?Hip flexion 5/5 2+/5  ?Hip extension      ?Hip abduction 5/5 2/5  ?Hip adduction 5/5 3/5  ?Hip internal rotation      ?Hip ext

## 2021-05-25 NOTE — Telephone Encounter (Signed)
faxed to [facility] ph [phone]  ?

## 2021-05-25 NOTE — Patient Instructions (Signed)
Access Code: [email or web address] ?URL: [email or web address] ?Date: 05/25/2021 ?Prepared by: Elease Etienne ? ?Exercises ?- Tandem Walking with Counter Support  - 1 x daily - 4 x weekly - 3 sets - 10 reps ?- Forward and backward walking with eyes open and counter support  - 1 x daily - 4 x weekly - 3 sets - 10 reps ?- Staggered Sit-to-Stand  - 1 x daily - 4 x weekly - 3 sets - 10 reps ?

## 2021-05-25 NOTE — Therapy (Signed)
?OUTPATIENT OCCUPATIONAL THERAPY TREATMENT NOTE ? ? ?Patient Name: Jerry Richards ?MRN: [id number] ?DOB:[birth year], 51 y.o., male ?Today's Date: 05/25/2021 ? ?PCP: [name], MD ?REFERRING PROVIDER: [name], MD ? ? OT End of Session - 05/25/21 1615   ? ? Visit Number 2   ? Number of Visits 17   ? Date for OT Re-Evaluation 07/29/21   ? Authorization Type BCBS   ? Authorization Time Period VL:[place]  No Auth   ? OT Start Time 1400   ? OT Stop Time 7371   ? OT Time Calculation (min) 45 min   ? Activity Tolerance Patient tolerated treatment well   ? Behavior During Therapy [facility] for tasks assessed/performed   moves very quickly  ? ?  ?  ? ?  ? ? ?Past Medical History:  ?Diagnosis Date  ? Anxiety   ? Atrial fibrillation ([place])   ? a. s/p RFCA x 2 (10/26/2011, 04/15/2012)  ? Atrial flutter ([place])   ? Chronic kidney disease   ? Clotting disorder ([place])   ? Colitis   ? Current smoker   ? Diverticulitis   ? 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 LA thrombus   ? Non-ischemic cardiomyopathy ([place])   ? OSA on CPAP   ? Sinus trouble   ? Sleep apnea   ? Systolic heart failure ([place])   ? EF 20-25%-> 50-55% in 10/2011. MOST RECENT ECHO (03/2012) EF= 66%  ? ?Past Surgical History:  ?Procedure Laterality Date  ? APPLICATION OF CRANIAL NAVIGATION Right 05/16/2021  ? Procedure: APPLICATION OF CRANIAL NAVIGATION;  Surgeon: Earnie Larsson, MD;  Location: [place];  Service: Neurosurgery;  Laterality: Right;  ? ATRIAL ABLATION SURGERY    ? CARDIAC CATHETERIZATION  08/2011  ? [facility]  ? CHOLECYSTECTOMY    ? CRANIOTOMY Right 05/16/2021  ? Procedure: Craniotomy - right - Parietal with  brainlab;  Surgeon: Earnie Larsson, MD;  Location: [place];  Service: Neurosurgery;  Laterality: Right;  ? FINGER SURGERY    ? LEFT HEART CATHETERIZATION WITH CORONARY ANGIOGRAM N/A 09/12/2011  ? Procedure: LEFT HEART CATHETERIZATION WITH CORONARY ANGIOGRAM;  Surgeon: Hillary Bow, MD;  Location: [facility] CATH LAB;  Service:  Cardiovascular;  Laterality: N/A;  ? VASECTOMY    ? ?Patient Active Problem List  ? Diagnosis Date Noted  ? Brain tumor ([place]) 05/16/2021  ? LLQ abdominal pain 08/13/2020  ? Diverticulitis of colon 08/13/2020  ? History of colonic polyps 08/13/2020  ? Sinus node dysfunction ([place]) 02/11/2014  ? Pulmonary vein stenosis 06/30/2013  ? S/P ablation of atrial fibrillation 02/25/2013  ? 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 Tobacco abuse 09/02/2012  ? Hyperthyroidism 09/02/2012  ? Palpitations 07/09/2012  ? Chronic anticoagulation 02/09/2012  ? Coronary artery disease excluded 02/09/2012  ? History of radiofrequency ablation for complex left atrial arrhythmia 02/09/2012  ? Atrial fibrillation with slow ventricular response ([place]) 02/09/2012  ? Tachycardia induced cardiomyopathy ([place]) 02/09/2012  ? Persistent atrial fibrillation ([place]) 10/26/2011  ? Sleep apnea 10/26/2011  ? Non-ischemic cardiomyopathy ([place]) 10/26/2011  ? Atrial fibrillation ([place]) 08/21/2011  ? Tachycardia induced cardiomyopathy ([place]) 08/21/2011  ? Atrial flutter ([place]) 08/21/2011  ? Obstructive sleep apnea 08/21/2011  ? FH: coronary artery disease 08/21/2011  ? Left atrial thrombus 06/10/2011  ? ? ?ONSET DATE: 03/22ONSET DATE: 05/18/2021  ?  ?REFERRING DIAG: D49.6 (ICD-10-CM) - Brain tumor ([place]) ? ? ?THERAPY DIAG:  ?Hemiplegia and hemiparesis following unspecified cerebrovascular  disease affecting left non-dominant side ([place]) ? ?Attention and concentration deficit ? ?Frontal lobe and executive function deficit ? ?Visuospatial deficit ? ?Other symptoms and signs involving the nervous system ? ?Muscle weakness (generalized) ? ?Unsteadiness on feet ? ? ?PERTINENT HISTORY: Biparietal craniotomy and resection of tumor on 3/20. PMH including anxiety, a-fib, and L heart cath (2013).   ? ?PRECAUTIONS: fall ? ?SUBJECTIVE: Nights are hard.  I don't necessarily have good thoughts at night.   ? ?PAIN:  ?Are you having pain? No ? ? ?  ? ?OBJECTIVE:   ? ?TODAY'S TREATMENT: ?OT session followed PT session today.  Patient reports mild visual disturbance when reading.  Patient also with mild rightward and posterior midline shift, evident with visual testing, but also with functional mobility.  Difficulty with managing weight shift forward and to left.  Patient tends to move quickly using momentum - however when facilitated and slowed down, impairments more evident.   ?Patient reports difficulty with attention and concentration , and even balance, in busy environment, and this worsens with fatigue.  Patient had difficulty with selective attention and tuning out extraneous movements, sights and sounds around him in busy gym.   ?Worked on simple mechanics of controlled sit to stand and stand to sit.  Worked on functional mobility and reducing hyperextension in left knee and weight shifts forward onto left leg.   ?Reviewed OT goals, and plan of care.   ? ? ?GOALS: ?Goals reviewed with patient? No ?  ?SHORT TERM GOALS: Target date: 06/17/2021 ?  ?Pt will be independent with HEP targeting coordination and proximal strength in LUE ?Baseline:  ?Goal status: INITIAL ?  ?2.  Pt will improve functional gross motor coordination with LUE by increasing Box and Blocks score by 3 blocks or more. ?Baseline: 28 blocks LUE, 52 RUE ?Goal status: INITIAL ?  ?3.  Pt will increase dynamic balance and safety with standing task for increasing independence with ADLs to set up and distant supervision. ?Baseline: supervision - min A for safety d/t unsteadiness ?Goal status: INITIAL ?  ?4.  Pt will attend to novel cognitive task for 8 minutes or greater with no cues for redirection. ?Baseline:  ?Goal status: INITIAL ?  ?  ?  ?  ?LONG TERM GOALS: Target date: 07/29/2021 ?  ?Pt will be independent with updated HEP targeting progressions for patient. ?Baseline:  ?Goal status: INITIAL ?  ?2.  Pt will increase functional fine motor coordination in LUE by completing 9 hole peg test in 65 seconds or  less. ?Baseline: 74s LUE ?Goal status: INITIAL ?  ?3.  Pt will perform simple warm meal prep and/or housekeeping with distant supervision for safety. ?Baseline:  ?Goal status: INITIAL ?  ?4.  Pt will perform environmental scanning in moderately distracting environment with 90% accuracy or greater. ?Baseline:          ?Goal status: INITIAL ?  ?5.  Pt will report using simple hand tools with distant supervision (no power tools)  ?Baseline:  ?Goal status: INITIAL ?  ?ASSESSMENT: ?  ?CLINICAL IMPRESSION: ?Patient is eager for improved performance and will benefit from structured OT to address functional mobility, visual perceptual skills, balance and use of LUE.    ?  ?PERFORMANCE DEFICITS in functional skills including ADLs, IADLs, coordination, dexterity, proprioception, sensation, ROM, strength, FMC, GMC, balance, decreased knowledge of use of DME, vision, and UE functional use, cognitive skills including attention, perception, problem solving, safety awareness, sequencing, temperament/personality, and thought, and psychosocial skills including coping strategies  and habits.  ?  ?IMPAIRMENTS are limiting patient from ADLs, IADLs, work, and leisure.  ?  ?COMORBIDITIES may have co-morbidities  that affects occupational performance. Patient will benefit from skilled OT to address above impairments and improve overall function. ?  ?  ?REHAB POTENTIAL: Good ?  ? ?  ?  ?  ?PLAN: ?OT FREQUENCY: 2x/week ?  ?OT DURATION: 10 weeks 16 visits over 10 weeks d/t any scheduling conflicts ?  ?PLANNED INTERVENTIONS: self care/ADL training, therapeutic exercise, therapeutic activity, neuromuscular re-education, manual therapy, aquatic therapy, ultrasound, patient/family education, cognitive remediation/compensation, visual/perceptual remediation/compensation, coping strategies training, and DME and/or AE instructions ?  ?RECOMMENDED OTHER SERVICES: possible benefit from ST evaluation ?  ?CONSULTED AND AGREED WITH PLAN OF CARE: Patient   ?  ?PLAN FOR NEXT SESSION: functional mobility, dynamic balance, visual processing, coordination and functional tasks for LUE, supine closed chain HEP and then progress to strengthening?, aquatics ? ? ? ?Mariah Milling,

## 2021-05-27 ENCOUNTER — Encounter: Payer: Self-pay | Admitting: Physical Therapy

## 2021-05-27 ENCOUNTER — Ambulatory Visit: Payer: BC Managed Care – PPO | Admitting: Physical Therapy

## 2021-05-27 ENCOUNTER — Telehealth: Payer: Self-pay | Admitting: Physical Therapy

## 2021-05-27 ENCOUNTER — Ambulatory Visit: Payer: BC Managed Care – PPO | Admitting: Occupational Therapy

## 2021-05-27 DIAGNOSIS — M21372 Foot drop, left foot: Secondary | ICD-10-CM

## 2021-05-27 DIAGNOSIS — R41842 Visuospatial deficit: Secondary | ICD-10-CM

## 2021-05-27 DIAGNOSIS — R260 Ataxic gait: Secondary | ICD-10-CM

## 2021-05-27 DIAGNOSIS — M6281 Muscle weakness (generalized): Secondary | ICD-10-CM | POA: Diagnosis not present

## 2021-05-27 DIAGNOSIS — R29818 Other symptoms and signs involving the nervous system: Secondary | ICD-10-CM

## 2021-05-27 DIAGNOSIS — R41844 Frontal lobe and executive function deficit: Secondary | ICD-10-CM

## 2021-05-27 DIAGNOSIS — R41840 Attention and concentration deficit: Secondary | ICD-10-CM

## 2021-05-27 DIAGNOSIS — I69954 Hemiplegia and hemiparesis following unspecified cerebrovascular disease affecting left non-dominant side: Secondary | ICD-10-CM

## 2021-05-27 DIAGNOSIS — R2681 Unsteadiness on feet: Secondary | ICD-10-CM

## 2021-05-27 NOTE — Telephone Encounter (Signed)
Dr. Annette Richards, ?Jerry Richards is being treated by physical therapy for follow-up care for brain tumor craniotomy.  He will benefit from use of Left AFO in order to improve safety with functional mobility.   ? ?If you agree, please submit request in EPIC under MD Order, Other Orders (list Left AFO in comments) or fax to [facility] at [phone].  ? ?Thank you, ?Elease Etienne, PT, DPT ? ? ? ?[address] ?[place], [place]  [address] ?Phone:  [phone] ?Fax:  [phone] ? ? ?

## 2021-05-27 NOTE — Therapy (Signed)
?OUTPATIENT PHYSICAL THERAPY TREATMENT NOTE ? ? ?Patient Name: VICENTE WEIDLER ?MRN: [id number] ?DOB:03/06/1970, 51 y.o., male ?Today's Date: 05/27/2021 ? ?PCP: [name], MD ?REFERRING PROVIDER: [name], MD ? ? PT End of Session - 05/27/21 1529   ? ? Visit Number 3   ? Number of Visits 17   ? Date for PT Re-Evaluation 07/15/21   ? Authorization Type BCBS 2023  30% coinsur   ? PT Start Time 4098   ? PT Stop Time 1610   ? PT Time Calculation (min) 43 min   ? Equipment Utilized During Treatment Gait belt   ? Activity Tolerance Patient tolerated treatment well   ? Behavior During Therapy [facility] for tasks assessed/performed;Restless;Impulsive   ? ?  ?  ? ?  ? ? ?Past Medical History:  ?Diagnosis Date  ? Anxiety   ? Atrial fibrillation ([place])   ? a. s/p RFCA x 2 (10/26/2011, 04/15/2012)  ? Atrial flutter ([place])   ? Chronic kidney disease   ? Clotting disorder ([place])   ? Colitis   ? Current smoker   ? Diverticulitis   ? 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 LA thrombus   ? Non-ischemic cardiomyopathy ([place])   ? OSA on CPAP   ? Sinus trouble   ? Sleep apnea   ? Systolic heart failure ([place])   ? EF 20-25%-> 50-55% in 10/2011. MOST RECENT ECHO (03/2012) EF= 66%  ? ?Past Surgical History:  ?Procedure Laterality Date  ? APPLICATION OF CRANIAL NAVIGATION Right 05/16/2021  ? Procedure: APPLICATION OF CRANIAL NAVIGATION;  Surgeon: Earnie Larsson, MD;  Location: [place];  Service: Neurosurgery;  Laterality: Right;  ? ATRIAL ABLATION SURGERY    ? CARDIAC CATHETERIZATION  08/2011  ? [facility]  ? CHOLECYSTECTOMY    ? CRANIOTOMY Right 05/16/2021  ? Procedure: Craniotomy - right - Parietal with  brainlab;  Surgeon: Earnie Larsson, MD;  Location: [place];  Service: Neurosurgery;  Laterality: Right;  ? FINGER SURGERY    ? LEFT HEART CATHETERIZATION WITH CORONARY ANGIOGRAM N/A 09/12/2011  ? Procedure: LEFT HEART CATHETERIZATION WITH CORONARY ANGIOGRAM;  Surgeon: Hillary Bow, MD;  Location: [facility] CATH LAB;   Service: Cardiovascular;  Laterality: N/A;  ? VASECTOMY    ? ?Patient Active Problem List  ? Diagnosis Date Noted  ? Brain tumor ([place]) 05/16/2021  ? LLQ abdominal pain 08/13/2020  ? Diverticulitis of colon 08/13/2020  ? History of colonic polyps 08/13/2020  ? Sinus node dysfunction ([place]) 02/11/2014  ? Pulmonary vein stenosis 06/30/2013  ? S/P ablation of atrial fibrillation 02/25/2013  ? 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 Tobacco abuse 09/02/2012  ? Hyperthyroidism 09/02/2012  ? Palpitations 07/09/2012  ? Chronic anticoagulation 02/09/2012  ? Coronary artery disease excluded 02/09/2012  ? History of radiofrequency ablation for complex left atrial arrhythmia 02/09/2012  ? Atrial fibrillation with slow ventricular response ([place]) 02/09/2012  ? Tachycardia induced cardiomyopathy ([place]) 02/09/2012  ? Persistent atrial fibrillation ([place]) 10/26/2011  ? Sleep apnea 10/26/2011  ? Non-ischemic cardiomyopathy ([place]) 10/26/2011  ? Atrial fibrillation ([place]) 08/21/2011  ? Tachycardia induced cardiomyopathy ([place]) 08/21/2011  ? Atrial flutter ([place]) 08/21/2011  ? Obstructive sleep apnea 08/21/2011  ? FH: coronary artery disease 08/21/2011  ? Left atrial thrombus 06/10/2011  ? ? ?REFERRING DIAG: D49.6 (ICD-10-CM) - Brain tumor ([place])  ? ?THERAPY DIAG:  ?Other symptoms and signs involving the nervous system ? ?Ataxic gait ? ?Muscle weakness (generalized) ? ?  Unsteadiness on feet ? ?Foot drop, left ? ?PERTINENT HISTORY: Per ED notes:  Pt presented to [facility] ED on 05/08/2021 for progressive L sided weakness and concerns over progressively worsening symptoms including left leg spasms, difficulty typing with his left hand, left face numbness.  He returned 05/09/2021 for follow-up MRI due to CT findings.  Pt underwent right craniotomy 05/16/2021 for biparietal craniotomy and resection of tumor using intraoperative stereotactic guidance. PMH:  hypothyroidism, cardiomyopathy, CAD, OSA, atrial flutter anticoagulated on Xarelto    ? ?PRECAUTIONS: Fall ? ?SUBJECTIVE: Pt states daughter has moved wedding up due to staging of cancer and being told they did not get all of the tumor.  He states he has been told he has 6 months to live so he wants to do everything he can to improve in that time and get an AFO so he can travel with his wife.  He had staples removed 05/26/2021.  ? ?PAIN:  ?Are you having pain? No- no headache today ?Pt dons AFO in sitting EOM.  Requires min cues and assistance from PT to position inside shoe and loosen laces. ?Continued use of L [place] AFO as pt prefers stability of this brace.  Trialed for 115' w/ RW w/ improved foot clearance and dec pt reported fatigue.  Continued gait training w/ SPC w/ quad tip on R side with inc cognitive load on pt requiring simple one steps cues to initiate sequence, 230'.  Pt able to replicate pattern using out loud counting to regulate step sequence.  Pt progressed to ambulating 345' w/o AD cued for dec speed to inc stance on LLE and inc quad engagement to controlled stance.  Pt still demos some hyperextension of the LLE especially w/ inc speed.  Improved control w/ brace compared to last visit w/o.  CGA for all trials of gait w/ tactile cues for speed. ?Pt stands at [place] EOM performing several bouts of adv retreat over 2">4" foam hurdle with therapist at L knee providing minA to place foot, cued for slow pace; switched to LLE in stance to promote quad control with soft knee bend ?Pt steps to 4 different color dot laid out in a line to and across midline.  Pt cued to step RLE to each dot and back to center w/ therapist using tactile cue and facilitation with knee behind pt knee to promote L quad control.  Progressed to cognitive dual task with given color sequence.  Pt requires minA-ModA depending on size of step. ?Toe taps to 4" step w/ cues for heel tap rolled into foot flat on step.  Pt does not use UE support for this, CGA-minA for safety.  Pt alt LE to facilitate coordination of LLE  and quad control in stance. ? ? ? ? ? ?OBJECTIVE:  ?  ?DIAGNOSTIC FINDINGS: Head CT showed a paramedian mass at the right vertex, abutting the falx and MRI showed right parasagittal likely extra-axial tumor.  2.9 x 2.0 x 2.6 cm mass at the medial right frontoparietal vertex.  Post-resection  MRI of brain from 05/17/2021 shows vasogenic edema surrounding resection cavity without midline shift.  No acute infarct.  Concern for resection margins. ?  ?COGNITION: ?Overall cognitive status:  labile, pt is good historian -information verified by wife, easily frustrated due to physical deficits, distractible to light and sound, impulsive ?            ?SENSATION: ?Light touch: Impaired  ?Proprioception: WFL ?Left hemibody numbness, "white noise", Worst at ankle to toes ?COORDINATION: ?  Impaired Heel-to-shin ?Impaired LE RAMPS ?  ?EDEMA:  ?None noted. ?  ?MUSCLE TONE: None noted. ?  ?DTRs:  ?Patella 2+ = Normal ?  ?POSTURE: No Significant postural limitations ?  ?LE ROM:    ?  ?Active  Right ?05/20/2021 Left ?05/20/2021  ?Hip flexion [facility] WFL  ?Hip extension      ?Hip abduction WFL Mid range  ?Hip adduction [facility] WFL  ?Hip internal rotation      ?Hip external rotation      ?Knee flexion [facility] WFL  ?Knee extension [facility] WFL  ?Ankle dorsiflexion WFL neutral  ?Ankle plantarflexion WFL 20 deg  ?Ankle inversion      ?Ankle eversion      ? (Blank rows = not tested) ?  ?MMT:   ?  ?MMT Right ?05/20/2021 Left ?05/20/2021  ?Hip flexion 5/5 2+/5  ?Hip extension      ?Hip abduction 5/5 2/5  ?Hip adduction 5/5 3/5  ?Hip internal rotation      ?Hip external rotation      ?Knee flexion 5/5 2+/5  ?Knee extension 5/5 3+/5  ?Ankle dorsiflexion 5/5 1/5  ?Ankle plantarflexion 5/5 2/5  ?Ankle inversion      ?Ankle eversion      ?(Blank rows = not tested) ?  ?TRANSFERS: ?Assistive device utilized: Environmental consultant - 2 wheeled  ?Sit to stand: CGA and Min A ?Stand to sit: CGA and Min A ?Chair to chair: CGA ?  ?GAIT: ?Gait pattern:  Inc UE use on AD, step to pattern,  decreased step length- Left, decreased hip/knee flexion- Left, decreased ankle dorsiflexion- Left, circumduction- Left, Left hip hike, genu recurvatum- Left, ataxic, decreased trunk rotation, and poor foot clear

## 2021-05-27 NOTE — Therapy (Signed)
?OUTPATIENT OCCUPATIONAL THERAPY TREATMENT NOTE ? ? ?Patient Name: Jerry Richards ?MRN: [id number] ?DOB:1970/09/18, 51 y.o., male ?Today's Date: 05/27/2021 ? ?PCP: [name], MD ?REFERRING PROVIDER: [name], MD ? ? OT End of Session - 05/27/21 1500   ? ? Visit Number 3   ? Number of Visits 17   ? Date for OT Re-Evaluation 07/29/21   ? Authorization Type BCBS   ? Authorization Time Period VL:[place]  No Auth   ? OT Start Time 6811   ? OT Stop Time 1530   ? OT Time Calculation (min) 45 min   ? Activity Tolerance Patient tolerated treatment well   ? Behavior During Therapy [facility] for tasks assessed/performed   moves very quickly  ? ?  ?  ? ?  ? ? ? ?Past Medical History:  ?Diagnosis Date  ? Anxiety   ? Atrial fibrillation ([place])   ? a. s/p RFCA x 2 (10/26/2011, 04/15/2012)  ? Atrial flutter ([place])   ? Chronic kidney disease   ? Clotting disorder ([place])   ? Colitis   ? Current smoker   ? Diverticulitis   ? 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 LA thrombus   ? Non-ischemic cardiomyopathy ([place])   ? OSA on CPAP   ? Sinus trouble   ? Sleep apnea   ? Systolic heart failure ([place])   ? EF 20-25%-> 50-55% in 10/2011. MOST RECENT ECHO (03/2012) EF= 66%  ? ?Past Surgical History:  ?Procedure Laterality Date  ? APPLICATION OF CRANIAL NAVIGATION Right 05/16/2021  ? Procedure: APPLICATION OF CRANIAL NAVIGATION;  Surgeon: Earnie Larsson, MD;  Location: [place];  Service: Neurosurgery;  Laterality: Right;  ? ATRIAL ABLATION SURGERY    ? CARDIAC CATHETERIZATION  08/2011  ? [facility]  ? CHOLECYSTECTOMY    ? CRANIOTOMY Right 05/16/2021  ? Procedure: Craniotomy - right - Parietal with  brainlab;  Surgeon: Earnie Larsson, MD;  Location: [place];  Service: Neurosurgery;  Laterality: Right;  ? FINGER SURGERY    ? LEFT HEART CATHETERIZATION WITH CORONARY ANGIOGRAM N/A 09/12/2011  ? Procedure: LEFT HEART CATHETERIZATION WITH CORONARY ANGIOGRAM;  Surgeon: Hillary Bow, MD;  Location: [facility] C[facility] LAB;  Service:  Cardiovascular;  Laterality: N/A;  ? VASECTOMY    ? ?Patient Active Problem List  ? Diagnosis Date Noted  ? Brain tumor ([place]) 05/16/2021  ? LLQ abdominal pain 08/13/2020  ? Diverticulitis of colon 08/13/2020  ? History of colonic polyps 08/13/2020  ? Sinus node dysfunction ([place]) 02/11/2014  ? Pulmonary vein stenosis 06/30/2013  ? S/P ablation of atrial fibrillation 02/25/2013  ? 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 Tobacco abuse 09/02/2012  ? Hyperthyroidism 09/02/2012  ? Palpitations 07/09/2012  ? Chronic anticoagulation 02/09/2012  ? Coronary artery disease excluded 02/09/2012  ? History of radiofrequency ablation for complex left atrial arrhythmia 02/09/2012  ? Atrial fibrillation with slow ventricular response ([place]) 02/09/2012  ? Tachycardia induced cardiomyopathy ([place]) 02/09/2012  ? Persistent atrial fibrillation ([place]) 10/26/2011  ? Sleep apnea 10/26/2011  ? Non-ischemic cardiomyopathy ([place]) 10/26/2011  ? Atrial fibrillation ([place]) 08/21/2011  ? Tachycardia induced cardiomyopathy ([place]) 08/21/2011  ? Atrial flutter ([place]) 08/21/2011  ? Obstructive sleep apnea 08/21/2011  ? FH: coronary artery disease 08/21/2011  ? Left atrial thrombus 06/10/2011  ? ? ?ONSET DATE: 03/22ONSET DATE: 05/18/2021  ?  ?REFERRING DIAG: D49.6 (ICD-10-CM) - Brain tumor ([place]) ? ? ?THERAPY DIAG:  ?Hemiplegia and hemiparesis following unspecified  cerebrovascular disease affecting left non-dominant side ([place]) ? ?Attention and concentration deficit ? ?Frontal lobe and executive function deficit ? ?Visuospatial deficit ? ?Muscle weakness (generalized) ? ?Other symptoms and signs involving the nervous system ? ?Unsteadiness on feet ? ? ?PERTINENT HISTORY: Biparietal craniotomy and resection of tumor on 3/20. PMH including anxiety, a-fib, and L heart cath (2013).   ? ?PRECAUTIONS: fall ? ?SUBJECTIVE: "The hand is doing a lot better" - reports on a 8 out of 10 for attention today. ? ?PAIN:  ?Are you having pain? No ? ? ?   ? ?OBJECTIVE:  ? ?TODAY'S TREATMENT: ? ? 05/27/21 ?Pt met in lobby and assisted to therapy gym with SBA and cues for slowing down with ambulation with 2wRW. ? ?Pt reports today has been a lot going on and attention is poorer than usual.  ? ?Small Peg Board with LUE with one at a time for placing pegs into board. Pt removed with in hand manipulation. Pt copied pattern with 100% accuracy however had errors while communicating with therapist but self-corrected. Pt req'd min cues for pattern and attention and completed with min difficulty and drops. Added distraction of communicating with therapist. Pt with difficulty maintaining attention to task without distraction of continuing to speak to therapist.  ? ?Constant Therapy Same Symbols level 7 with 85% accuracy and 24.51s response time.  Alternating Symbols level 5 with 89% accuracy and 32.94s response time. Pt's error's with distractions and mostly in middle of screen. ? ? ?  ? ? ? ?GOALS: ?Goals reviewed with patient? No ?  ?SHORT TERM GOALS: Target date: 06/17/2021 ?  ?Pt will be independent with HEP targeting coordination and proximal strength in LUE ?Baseline:  ?Goal status: ONGOING ?  ?2.  Pt will improve functional gross motor coordination with LUE by increasing Box and Blocks score by 3 blocks or more. ?Baseline: 28 blocks LUE, 52 RUE ?Goal status: ONGOING ?  ?3.  Pt will increase dynamic balance and safety with standing task for increasing independence with ADLs to set up and distant supervision. ?Baseline: supervision - min A for safety d/t unsteadiness ?Goal status: ONGOING ?  ?4.  Pt will attend to novel cognitive task for 8 minutes or greater with no cues for redirection. ?Baseline:  ?Goal status: ONGOING ?  ?  ?  ?  ?LONG TERM GOALS: Target date: 07/29/2021 ?  ?Pt will be independent with updated HEP targeting progressions for patient. ?Baseline:  ?Goal status: INITIAL ?  ?2.  Pt will increase functional fine motor coordination in LUE by completing 9 hole  peg test in 65 seconds or less. ?Baseline: 74s LUE ?Goal status: INITIAL ?  ?3.  Pt will perform simple warm meal prep and/or housekeeping with distant supervision for safety. ?Baseline:  ?Goal status: INITIAL ?  ?4.  Pt will perform environmental scanning in moderately distracting environment with 90% accuracy or greater. ?Baseline:          ?Goal status: INITIAL ?  ?5.  Pt will report using simple hand tools with distant supervision (no power tools)  ?Baseline:  ?Goal status: INITIAL ?  ?ASSESSMENT: ?  ?CLINICAL IMPRESSION: ?Pt demonstrating slightly less impulsive movements and speed with ambulation and mobility but continues to demonstrate deficits with overall attention to tasks.   ?  ?PERFORMANCE DEFICITS in functional skills including ADLs, IADLs, coordination, dexterity, proprioception, sensation, ROM, strength, FMC, GMC, balance, decreased knowledge of use of DME, vision, and UE functional use, cognitive skills including attention, perception, problem solving, safety  awareness, sequencing, temperament/personality, and thought, and psychosocial skills including coping strategies and habits.  ?  ?IMPAIRMENTS are limiting patient from ADLs, IADLs, work, and leisure.  ?  ?COMORBIDITIES may have co-morbidities  that affects occupational performance. Patient will benefit from skilled OT to address above impairments and improve overall function. ?  ?  ?REHAB POTENTIAL: Good ?  ? ?  ?  ?  ?PLAN: ?OT FREQUENCY: 2x/week ?  ?OT DURATION: 10 weeks 16 visits over 10 weeks d/t any scheduling conflicts ?  ?PLANNED INTERVENTIONS: self care/ADL training, therapeutic exercise, therapeutic activity, neuromuscular re-education, manual therapy, aquatic therapy, ultrasound, patient/family education, cognitive remediation/compensation, visual/perceptual remediation/compensation, coping strategies training, and DME and/or AE instructions ?  ?RECOMMENDED OTHER SERVICES: possible benefit from ST evaluation ?  ?CONSULTED AND AGREED  WITH PLAN OF CARE: Patient  ?  ?PLAN FOR NEXT SESSION: functional mobility, dynamic balance, visual processing, coordination and functional tasks for LUE, supine closed chain HEP and then progress to strengthening?, aquatics ?

## 2021-05-30 ENCOUNTER — Encounter: Payer: Self-pay | Admitting: Occupational Therapy

## 2021-05-30 ENCOUNTER — Ambulatory Visit: Payer: BC Managed Care – PPO | Admitting: Neurology

## 2021-05-30 ENCOUNTER — Ambulatory Visit: Payer: BC Managed Care – PPO | Admitting: Physical Therapy

## 2021-05-30 ENCOUNTER — Ambulatory Visit: Payer: BC Managed Care – PPO | Attending: Family Medicine | Admitting: Occupational Therapy

## 2021-05-30 DIAGNOSIS — R2681 Unsteadiness on feet: Secondary | ICD-10-CM | POA: Insufficient documentation

## 2021-05-30 DIAGNOSIS — R41842 Visuospatial deficit: Secondary | ICD-10-CM | POA: Diagnosis present

## 2021-05-30 DIAGNOSIS — R260 Ataxic gait: Secondary | ICD-10-CM | POA: Diagnosis present

## 2021-05-30 DIAGNOSIS — M6281 Muscle weakness (generalized): Secondary | ICD-10-CM | POA: Insufficient documentation

## 2021-05-30 DIAGNOSIS — I69954 Hemiplegia and hemiparesis following unspecified cerebrovascular disease affecting left non-dominant side: Secondary | ICD-10-CM | POA: Diagnosis present

## 2021-05-30 DIAGNOSIS — M21372 Foot drop, left foot: Secondary | ICD-10-CM | POA: Diagnosis present

## 2021-05-30 DIAGNOSIS — R41844 Frontal lobe and executive function deficit: Secondary | ICD-10-CM | POA: Insufficient documentation

## 2021-05-30 DIAGNOSIS — R41840 Attention and concentration deficit: Secondary | ICD-10-CM | POA: Insufficient documentation

## 2021-05-30 DIAGNOSIS — R2689 Other abnormalities of gait and mobility: Secondary | ICD-10-CM | POA: Insufficient documentation

## 2021-05-30 DIAGNOSIS — R29818 Other symptoms and signs involving the nervous system: Secondary | ICD-10-CM | POA: Insufficient documentation

## 2021-05-30 NOTE — Therapy (Signed)
?OUTPATIENT OCCUPATIONAL THERAPY TREATMENT NOTE ? ? ?Patient Name: Jerry Richards ?MRN: [id number] ?DOB:[birth year], 51 y.o., male ?Today's Date: 05/30/2021 ? ?PCP: [name], MD ?REFERRING PROVIDER: [name], MD ? ? OT End of Session - 05/30/21 1414   ? ? Visit Number 4   ? Number of Visits 17   ? Date for OT Re-Evaluation 07/29/21   ? Authorization Type BCBS   ? Authorization Time Period VL:MN  No Auth   ? OT Start Time 1413   arrival time  ? OT Stop Time 5056   ? OT Time Calculation (min) 32 min   ? Activity Tolerance Patient tolerated treatment well   ? Behavior During Therapy [facility] for tasks assessed/performed   moves very quickly  ? ?  ?  ? ?  ? ? ? ?Past Medical History:  ?Diagnosis Date  ? Anxiety   ? Atrial fibrillation ([place])   ? a. s/p RFCA x 2 (10/26/2011, 04/15/2012)  ? Atrial flutter ([place])   ? Chronic kidney disease   ? Clotting disorder ([place])   ? Colitis   ? Current smoker   ? Diverticulitis   ? 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 LA thrombus   ? Non-ischemic cardiomyopathy ([place])   ? OSA on CPAP   ? Sinus trouble   ? Sleep apnea   ? Systolic heart failure ([place])   ? EF 20-25%-> 50-55% in 10/2011. MOST RECENT ECHO (03/2012) EF= 66%  ? ?Past Surgical History:  ?Procedure Laterality Date  ? APPLICATION OF CRANIAL NAVIGATION Right 05/16/2021  ? Procedure: APPLICATION OF CRANIAL NAVIGATION;  Surgeon: Earnie Larsson, MD;  Location: [place];  Service: Neurosurgery;  Laterality: Right;  ? ATRIAL ABLATION SURGERY    ? CARDIAC CATHETERIZATION  08/2011  ? [facility]  ? CHOLECYSTECTOMY    ? CRANIOTOMY Right 05/16/2021  ? Procedure: Craniotomy - right - Parietal with  brainlab;  Surgeon: Earnie Larsson, MD;  Location: [place];  Service: Neurosurgery;  Laterality: Right;  ? FINGER SURGERY    ? LEFT HEART CATHETERIZATION WITH CORONARY ANGIOGRAM N/A 09/12/2011  ? Procedure: LEFT HEART CATHETERIZATION WITH CORONARY ANGIOGRAM;  Surgeon: Hillary Bow, MD;  Location: [facility] CATH LAB;   Service: Cardiovascular;  Laterality: N/A;  ? VASECTOMY    ? ?Patient Active Problem List  ? Diagnosis Date Noted  ? Brain tumor ([place]) 05/16/2021  ? LLQ abdominal pain 08/13/2020  ? Diverticulitis of colon 08/13/2020  ? History of colonic polyps 08/13/2020  ? Sinus node dysfunction ([place]) 02/11/2014  ? Pulmonary vein stenosis 06/30/2013  ? S/P ablation of atrial fibrillation 02/25/2013  ? 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 Tobacco abuse 09/02/2012  ? Hyperthyroidism 09/02/2012  ? Palpitations 07/09/2012  ? Chronic anticoagulation 02/09/2012  ? Coronary artery disease excluded 02/09/2012  ? History of radiofrequency ablation for complex left atrial arrhythmia 02/09/2012  ? Atrial fibrillation with slow ventricular response ([place]) 02/09/2012  ? Tachycardia induced cardiomyopathy ([place]) 02/09/2012  ? Persistent atrial fibrillation ([place]) 10/26/2011  ? Sleep apnea 10/26/2011  ? Non-ischemic cardiomyopathy ([place]) 10/26/2011  ? Atrial fibrillation ([place]) 08/21/2011  ? Tachycardia induced cardiomyopathy ([place]) 08/21/2011  ? Atrial flutter ([place]) 08/21/2011  ? Obstructive sleep apnea 08/21/2011  ? FH: coronary artery disease 08/21/2011  ? Left atrial thrombus 06/10/2011  ? ? ?ONSET DATE: 03/22ONSET DATE: 05/18/2021  ?  ?REFERRING DIAG: D49.6 (ICD-10-CM) - Brain tumor ([place]) ? ? ?THERAPY DIAG:  ?Hemiplegia and  hemiparesis following unspecified cerebrovascular disease affecting left non-dominant side ([place]) ? ?Attention and concentration deficit ? ?Frontal lobe and executive function deficit ? ?Visuospatial deficit ? ?Muscle weakness (generalized) ? ?Unsteadiness on feet ? ?Other symptoms and signs involving the nervous system ? ? ?PERTINENT HISTORY: Biparietal craniotomy and resection of tumor on 3/20. PMH including anxiety, a-fib, and L heart cath (2013).   ? ?PRECAUTIONS: fall, goes by "[name]" ? ?SUBJECTIVE: "The hand is doing a lot better" - reports on a 8 out of 10 for attention today. ? ?PAIN:  ?Are you having  pain? No ? ? ?  ? ?OBJECTIVE:  ? ?TODAY'S TREATMENT: ? ?05/30/21  ?Pt was coming out of bathroom when OT went to get him for therapy session. Pt assisted to therapy gym and to sit at table for therapy with close supervision and cues for safety. Pt arrived with single point cane vs rolling walker today.  ? ?Pt reports steroids have decreased and have improved overall impulsivity. ? ?Work pt reports going to work briefly to catch up on some things and tried typing and reports increased fatigue (mental and physical). ? ?Organizing Day (functional problem solving) with instruction to read entire paragraph and then organize day. Pt immediately started writing down items prior to reading entire paragraph and had to back track. Pt req'd max cues for breaking down paragraph into list form and breaking down into steps for sequencing and organizing.  ? ?----------------------------------------------------------- ? ? ?05/27/21 ?Pt met in lobby and assisted to therapy gym with SBA and cues for slowing down with ambulation with 2wRW. ? ?Pt reports today has been a lot going on and attention is poorer than usual.  ? ?Small Peg Board with LUE with one at a time for placing pegs into board. Pt removed with in hand manipulation. Pt copied pattern with 100% accuracy however had errors while communicating with therapist but self-corrected. Pt req'd min cues for pattern and attention and completed with min difficulty and drops. Added distraction of communicating with therapist. Pt with difficulty maintaining attention to task without distraction of continuing to speak to therapist.  ? ?Constant Therapy Same Symbols level 7 with 85% accuracy and 24.51s response time.  Alternating Symbols level 5 with 89% accuracy and 32.94s response time. Pt's error's with distractions and mostly in middle of screen. ? ? ?  ?GOALS: ?Goals reviewed with patient? Yes ?  ?SHORT TERM GOALS: Target date: 06/17/2021 ?  ?Pt will be independent with HEP targeting  coordination and proximal strength in LUE ?Baseline:  ?Goal status: ONGOING ?  ?2.  Pt will improve functional gross motor coordination with LUE by increasing Box and Blocks score by 3 blocks or more. ?Baseline: 28 blocks LUE, 52 RUE ?Goal status: ONGOING improved coordination - continue to assess goal ?  ?3.  Pt will increase dynamic balance and safety with standing task for increasing independence with ADLs to set up and distant supervision. ?Baseline: supervision - min A for safety d/t unsteadiness ?Goal status: ONGOING ?  ?4.  Pt will attend to novel cognitive task for 8 minutes or greater with no cues for redirection. ?Baseline:  ?Goal status: ONGOING pt attends to task however with increased external conversations during task. ?  ?  ?  ?  ?LONG TERM GOALS: Target date: 07/29/2021 ?  ?Pt will be independent with updated HEP targeting progressions for patient. ?Baseline:  ?Goal status: INITIAL ?  ?2.  Pt will increase functional fine motor coordination in LUE by completing 9 hole  peg test in 65 seconds or less. ?Baseline: 74s LUE ?Goal status: INITIAL ?  ?3.  Pt will perform simple warm meal prep and/or housekeeping with distant supervision for safety. ?Baseline:  ?Goal status: INITIAL ?  ?4.  Pt will perform environmental scanning in moderately distracting environment with 90% accuracy or greater. ?Baseline:          ?Goal status: INITIAL ?  ?5.  Pt will report using simple hand tools with distant supervision (no power tools)  ?Baseline:  ?Goal status: INITIAL ?  ?ASSESSMENT: ?  ?CLINICAL IMPRESSION: ?Pt continues to present with significant impulsivity but somewhat improved. Pt with difficulty with higher level functional problem solving today.  ?  ?PERFORMANCE DEFICITS in functional skills including ADLs, IADLs, coordination, dexterity, proprioception, sensation, ROM, strength, FMC, GMC, balance, decreased knowledge of use of DME, vision, and UE functional use, cognitive skills including attention, perception,  problem solving, safety awareness, sequencing, temperament/personality, and thought, and psychosocial skills including coping strategies and habits.  ?  ?IMPAIRMENTS are limiting patient from ADLs, IADLs, work

## 2021-05-30 NOTE — Therapy (Signed)
?OUTPATIENT PHYSICAL THERAPY TREATMENT NOTE ? ? ?Patient Name: Jerry Richards ?MRN: [id number] ?DOB:[birth year], 51 y.o., male ?Today's Date: 05/30/2021 ? ?PCP: [name], MD ?REFERRING PROVIDER: Earnie Larsson, MD ? ? PT End of Session - 05/30/21 1449   ? ? Visit Number 4   ? Number of Visits 17   ? Date for PT Re-Evaluation 07/15/21   ? Authorization Type BCBS 2023  30% coinsur   ? PT Start Time 0623   ? PT Stop Time 7628   ? PT Time Calculation (min) 42 min   ? Equipment Utilized During Treatment Gait belt   ? Activity Tolerance Patient tolerated treatment well   ? Behavior During Therapy [facility] for tasks assessed/performed;Restless;Impulsive   ? ?  ?  ? ?  ? ? ? ?Past Medical History:  ?Diagnosis Date  ? Anxiety   ? Atrial fibrillation ([place])   ? a. s/p RFCA x 2 (10/26/2011, 04/15/2012)  ? Atrial flutter ([place])   ? Chronic kidney disease   ? Clotting disorder ([place])   ? Colitis   ? Current smoker   ? Diverticulitis   ? 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 LA thrombus   ? Non-ischemic cardiomyopathy ([place])   ? OSA on CPAP   ? Sinus trouble   ? Sleep apnea   ? Systolic heart failure ([place])   ? EF 20-25%-> 50-55% in 10/2011. MOST RECENT ECHO (03/2012) EF= 66%  ? ?Past Surgical History:  ?Procedure Laterality Date  ? APPLICATION OF CRANIAL NAVIGATION Right 05/16/2021  ? Procedure: APPLICATION OF CRANIAL NAVIGATION;  Surgeon: Earnie Larsson, MD;  Location: [place];  Service: Neurosurgery;  Laterality: Right;  ? ATRIAL ABLATION SURGERY    ? CARDIAC CATHETERIZATION  08/2011  ? [facility]  ? CHOLECYSTECTOMY    ? CRANIOTOMY Right 05/16/2021  ? Procedure: Craniotomy - right - Parietal with  brainlab;  Surgeon: Earnie Larsson, MD;  Location: [place];  Service: Neurosurgery;  Laterality: Right;  ? FINGER SURGERY    ? LEFT HEART CATHETERIZATION WITH CORONARY ANGIOGRAM N/A 09/12/2011  ? Procedure: LEFT HEART CATHETERIZATION WITH CORONARY ANGIOGRAM;  Surgeon: Hillary Bow, MD;  Location[facility] CATH LAB;  Service:  Cardiovascular;  Laterality: N/A;  ? VASECTOMY    ? ?Patient Active Problem List  ? Diagnosis Date Noted  ? Brain tumor ([place]) 05/16/2021  ? LLQ abdominal pain 08/13/2020  ? Diverticulitis of colon 08/13/2020  ? History of colonic polyps 08/13/2020  ? Sinus node dysfunction ([place]) 02/11/2014  ? Pulmonary vein stenosis 06/30/2013  ? S/P ablation of atrial fibrillation 02/25/2013  ? 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 Tobacco abuse 09/02/2012  ? Hyperthyroidism 09/02/2012  ? Palpitations 07/09/2012  ? Chronic anticoagulation 02/09/2012  ? Coronary artery disease excluded 02/09/2012  ? History of radiofrequency ablation for complex left atrial arrhythmia 02/09/2012  ? Atrial fibrillation with slow ventricular response ([place]) 02/09/2012  ? Tachycardia induced cardiomyopathy ([place]) 02/09/2012  ? Persistent atrial fibrillation ([place]) 10/26/2011  ? Sleep apnea 10/26/2011  ? Non-ischemic cardiomyopathy ([place]) 10/26/2011  ? Atrial fibrillation ([place]) 08/21/2011  ? Tachycardia induced cardiomyopathy ([place]) 08/21/2011  ? Atrial flutter ([place]) 08/21/2011  ? Obstructive sleep apnea 08/21/2011  ? FH: coronary artery disease 08/21/2011  ? Left atrial thrombus 06/10/2011  ? ? ?REFERRING DIAG: D49.6 (ICD-10-CM) - Brain tumor ([place])  ? ?THERAPY DIAG:  ?Muscle weakness (generalized) ? ?Unsteadiness on feet ? ?Other abnormalities of gait and mobility ? ?  PERTINENT HISTORY: Per ED notes:  Pt presented to [facility] ED on 05/08/2021 for progressive L sided weakness and concerns over progressively worsening symptoms including left leg spasms, difficulty typing with his left hand, left face numbness.  He returned 05/09/2021 for follow-up MRI due to CT findings.  Pt underwent right craniotomy 05/16/2021 for biparietal craniotomy and resection of tumor using intraoperative stereotactic guidance. PMH:  hypothyroidism, cardiomyopathy, CAD, OSA, atrial flutter anticoagulated on Xarelto   ? ?PRECAUTIONS: Fall ? ?SUBJECTIVE: States he has  been trying to get 1000+ steps per day and has been using stationary bike at home "which makes my left leg angry" ? ?PAIN:  ?Are you having pain? No - reports soreness in L lumbar region  ? ? ? ?OBJECTIVE:  ?  ?DIAGNOSTIC FINDINGS: Head CT showed a paramedian mass at the right vertex, abutting the falx and MRI showed right parasagittal likely extra-axial tumor.  2.9 x 2.0 x 2.6 cm mass at the medial right frontoparietal vertex.  Post-resection  MRI of brain from 05/17/2021 shows vasogenic edema surrounding resection cavity without midline shift.  No acute infarct.  Concern for resection margins. ?  ?COGNITION: ?Overall cognitive status:  labile, pt is good historian -information verified by wife, easily frustrated due to physical deficits, distractible to light and sound, impulsive ?            ? ?  TODAY'S TREATMENT  ?Gait pattern: step through pattern, decreased step length- Left, decreased stance time- Left, Left steppage, Left foot flat, genu recurvatum- Left, wide BOS, and poor foot clearance- Left ?Distance walked: 230' ?Assistive device utilized: Quad cane small base ?Level of assistance: Min A ?Comments: With foot-up brace on LLE. Noted variable gait speed resulting in poor step clearance of LLE and lateral instability. Min verbal cues to reduce speed for increased stability. Pt frequently picking up SBQD rather than placing it on ground.  ? ?Gait pattern:  See above ?Distance walked: >500' on outdoor surfaces (sidewalk, grass) ?Assistive device utilized: Quad cane small base ?Level of assistance: Min A ?Comments: with L townsend donned, pt ambulated up/down small inclines on sidewalk and in grass/gravel w/min A and counting steps aloud, which helps pt pace. Noted when pt began to talk to therapist, his gait speed and asymmetric step length increased, requiring increased assistance to avoid fall and cues to redirect his focus on his steps.  ? ? ? ?PATIENT EDUCATION: ?Education details: Edu on continued use of  RW for safety and trialing use of toe-up with ankle brace vs toe-up to determine safest option for daughter's wedding.  Edu on update of AFO order  ?Person educated: Patient ?Education method: Explanation ?Education comprehension: verbalized understanding and needs further education ?  ?  ?HOME EXERCISE PROGRAM: ?Access Code: [email or web address] ?URL: [email or web address] ?Date: 05/25/2021 ?Prepared by: Elease Etienne ? ?Exercises ?- Tandem Walking with Counter Support  - 1 x daily - 4 x weekly - 3 sets - 10 reps -Cued to prevent crossing midline ?- Forward and backward walking with eyes open and counter support  - 1 x daily - 4 x weekly - 3 sets - 10 reps -trialed L and R arm support w/ inc sway when using L arm ?- Staggered Sit-to-Stand  - 1 x daily - 4 x weekly - 3 sets - 10 reps -each LE ? With walking tasks pt to focus on intentional steps with correction of foot placement during task before proceeding to next step ?  ?  ?GOALS: ?Goals reviewed with patient?  Yes ?  ?SHORT TERM GOALS: Target date:  06/17/2021 ?  ?Pt will be independent with strength and balance HEP with supervision from family. ?Baseline: To be initiated next session. ?Goal status: INITIAL ?  ?2.  Pt will decrease 5xSTS to <19 seconds in order to demonstrate decreased risk for falls and improved functional bilateral LE strength and power. ?Baseline: 21.4sec ?Goal status: INITIAL ?  ?3.  Pt will trial AFO with PT to request order as appropriate to promote knee and ankle alignment during gait. ?Baseline: To be initiated in following sessions. ?Goal status: INITIAL ?  ?4.  PT will assess FGA and set appropriate LTG. ?Baseline: Goal Set ?Goal status: MET ?  ?5.  Pt will demonstrate a gait speed of >1.5 feet/sec in order to decrease risk for falls. ?Baseline: 1.21 ft/sec ?Goal status: INITIAL ?  ?LONG TERM GOALS: Target date:  07/15/2021 ?  ?Pt will be independent with finalized strength and balance HEP with supervision from family. ?Baseline:  To be initiated in follow-up visit. ?Goal status: INITIAL ?  ?2.  Pt will decrease 5xSTS to <17 seconds in order to demonstrate decreased risk for falls and improved functional bilateral LE strength and

## 2021-05-31 ENCOUNTER — Telehealth: Payer: Self-pay | Admitting: Internal Medicine

## 2021-05-31 ENCOUNTER — Other Ambulatory Visit: Payer: Self-pay

## 2021-05-31 ENCOUNTER — Encounter (HOSPITAL_COMMUNITY): Payer: Self-pay

## 2021-05-31 LAB — SURGICAL PATHOLOGY

## 2021-05-31 NOTE — Telephone Encounter (Signed)
Scheduled appointment per 04/03 referral. Patient is aware of appointment date and time. Patient is aware to arrive 15 mins prior to appointment time and to bring updated insurance cards. Patient is aware of location.  ? ?

## 2021-06-01 ENCOUNTER — Telehealth: Payer: Self-pay | Admitting: Physical Therapy

## 2021-06-01 ENCOUNTER — Ambulatory Visit: Payer: BC Managed Care – PPO | Admitting: Physical Therapy

## 2021-06-01 ENCOUNTER — Ambulatory Visit: Payer: BC Managed Care – PPO | Admitting: Occupational Therapy

## 2021-06-01 ENCOUNTER — Encounter: Payer: Self-pay | Admitting: Physical Therapy

## 2021-06-01 DIAGNOSIS — I69954 Hemiplegia and hemiparesis following unspecified cerebrovascular disease affecting left non-dominant side: Secondary | ICD-10-CM

## 2021-06-01 DIAGNOSIS — R41842 Visuospatial deficit: Secondary | ICD-10-CM

## 2021-06-01 DIAGNOSIS — R2681 Unsteadiness on feet: Secondary | ICD-10-CM

## 2021-06-01 DIAGNOSIS — R41840 Attention and concentration deficit: Secondary | ICD-10-CM

## 2021-06-01 DIAGNOSIS — M6281 Muscle weakness (generalized): Secondary | ICD-10-CM

## 2021-06-01 DIAGNOSIS — R41844 Frontal lobe and executive function deficit: Secondary | ICD-10-CM

## 2021-06-01 DIAGNOSIS — R2689 Other abnormalities of gait and mobility: Secondary | ICD-10-CM

## 2021-06-01 NOTE — Therapy (Signed)
?OUTPATIENT OCCUPATIONAL THERAPY TREATMENT NOTE ? ? ?Patient Name: Jerry Richards ?MRN: [id number] ?DOB:07-19-1970, 51 y.o., male ?Today's Date: 06/01/2021 ? ?PCP: [name], MD ?REFERRING PROVIDER: [name], MD ? ? OT End of Session - 06/01/21 1403   ? ? Visit Number 5   ? Number of Visits 17   ? Date for OT Re-Evaluation 07/29/21   ? Authorization Type BCBS   ? Authorization Time Period VL:MN  No Auth   ? OT Start Time 1403   ? OT Stop Time 4401   ? OT Time Calculation (min) 42 min   ? Activity Tolerance Patient tolerated treatment well   ? Behavior During Therapy [facility] for tasks assessed/performed   moves very quickly  ? ?  ?  ? ?  ? ? ? ? ?Past Medical History:  ?Diagnosis Date  ? Anxiety   ? Atrial fibrillation ([place])   ? a. s/p RFCA x 2 (10/26/2011, 04/15/2012)  ? Atrial flutter ([place])   ? Chronic kidney disease   ? Clotting disorder ([place])   ? Colitis   ? Current smoker   ? Diverticulitis   ? 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 LA thrombus   ? Non-ischemic cardiomyopathy ([place])   ? OSA on CPAP   ? Sinus trouble   ? Sleep apnea   ? Systolic heart failure ([place])   ? EF 20-25%-> 50-55% in 10/2011. MOST RECENT ECHO (03/2012) EF= 66%  ? ?Past Surgical History:  ?Procedure Laterality Date  ? APPLICATION OF CRANIAL NAVIGATION Right 05/16/2021  ? Procedure: APPLICATION OF CRANIAL NAVIGATION;  Surgeon: Earnie Larsson, MD;  Location: [place];  Service: Neurosurgery;  Laterality: Right;  ? ATRIAL ABLATION SURGERY    ? CARDIAC CATHETERIZATION  08/2011  ? [facility]  ? CHOLECYSTECTOMY    ? CRANIOTOMY Right 05/16/2021  ? Procedure: Craniotomy - right - Parietal with  brainlab;  Surgeon: Earnie Larsson, MD;  Location: [place];  Service: Neurosurgery;  Laterality: Right;  ? FINGER SURGERY    ? LEFT HEART CATHETERIZATION WITH CORONARY ANGIOGRAM N/A 09/12/2011  ? Procedure: LEFT HEART CATHETERIZATION WITH CORONARY ANGIOGRAM;  Surgeon: Hillary Bow, MD;  Location: [facility] CATH LAB;  Service:  Cardiovascular;  Laterality: N/A;  ? VASECTOMY    ? ?Patient Active Problem List  ? Diagnosis Date Noted  ? Brain tumor ([place]) 05/16/2021  ? LLQ abdominal pain 08/13/2020  ? Diverticulitis of colon 08/13/2020  ? History of colonic polyps 08/13/2020  ? Sinus node dysfunction ([place]) 02/11/2014  ? Pulmonary vein stenosis 06/30/2013  ? S/P ablation of atrial fibrillation 02/25/2013  ? 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 Tobacco abuse 09/02/2012  ? Hyperthyroidism 09/02/2012  ? Palpitations 07/09/2012  ? Chronic anticoagulation 02/09/2012  ? Coronary artery disease excluded 02/09/2012  ? History of radiofrequency ablation for complex left atrial arrhythmia 02/09/2012  ? Atrial fibrillation with slow ventricular response ([place]) 02/09/2012  ? Tachycardia induced cardiomyopathy ([place]) 02/09/2012  ? Persistent atrial fibrillation ([place]) 10/26/2011  ? Sleep apnea 10/26/2011  ? Non-ischemic cardiomyopathy ([place]) 10/26/2011  ? Atrial fibrillation ([place]) 08/21/2011  ? Tachycardia induced cardiomyopathy ([place]) 08/21/2011  ? Atrial flutter ([place]) 08/21/2011  ? Obstructive sleep apnea 08/21/2011  ? FH: coronary artery disease 08/21/2011  ? Left atrial thrombus 06/10/2011  ? ? ?ONSET DATE: 03/22ONSET DATE: 05/18/2021  ?  ?REFERRING DIAG: D49.6 (ICD-10-CM) - Brain tumor ([place]) ? ? ?THERAPY DIAG:  ?Muscle weakness (generalized) ? ?  Unsteadiness on feet ? ?Frontal lobe and executive function deficit ? ?Attention and concentration deficit ? ?Visuospatial deficit ? ?Hemiplegia and hemiparesis following unspecified cerebrovascular disease affecting left non-dominant side ([place]) ? ? ?PERTINENT HISTORY: Biparietal craniotomy and resection of tumor on 3/20. PMH including anxiety, a-fib, and L heart cath (2013).   ? ?PRECAUTIONS: fall, goes by "[name]" ? ?SUBJECTIVE: Pt reports a headache.  ? ?PAIN:  ?Are you having pain? Yes: NPRS scale: 5/10 ?Pain location: headache ?Pain description: headache ?Aggravating factors: busy -  going ?Relieving factors: resting ? ? ?  ? ?OBJECTIVE:  ? ?TODAY'S TREATMENT: ? ? 06/01/21 ?Deductive Reasoning Puzzle (store owners) with max cues for solving. Pt assisted with second puzzle (owners and home and animals) with assistance for breaking down information and determining categories. Pt continues to have max difficulty with attending to task, listening and comprehending directions and strategies for breaking down information for solving puzzle. Pt with difficulty with increased distractions and people asking questions in the middle of solving puzzle.  ? ?Constant Therapy Read a Map level 1 with 70% accuracy. ?----------------------------------------------------------- ? ?05/30/21  ?Pt was coming out of bathroom when OT went to get him for therapy session. Pt assisted to therapy gym and to sit at table for therapy with close supervision and cues for safety. Pt arrived with single point cane vs rolling walker today.  ? ?Pt reports steroids have decreased and have improved overall impulsivity. ? ?Work pt reports going to work briefly to catch up on some things and tried typing and reports increased fatigue (mental and physical). ? ?Organizing Day (functional problem solving) with instruction to read entire paragraph and then organize day. Pt immediately started writing down items prior to reading entire paragraph and had to back track. Pt req'd max cues for breaking down paragraph into list form and breaking down into steps for sequencing and organizing.  ? ? ?  ?GOALS: ?Goals reviewed with patient? Yes ?  ?SHORT TERM GOALS: Target date: 06/17/2021 ?  ?Pt will be independent with HEP targeting coordination and proximal strength in LUE ?Baseline:  ?Goal status: ONGOING ?  ?2.  Pt will improve functional gross motor coordination with LUE by increasing Box and Blocks score by 3 blocks or more. ?Baseline: 28 blocks LUE, 52 RUE ?Goal status: ONGOING improved coordination - continue to assess goal ?  ?3.  Pt will  increase dynamic balance and safety with standing task for increasing independence with ADLs to set up and distant supervision. ?Baseline: supervision - min A for safety d/t unsteadiness ?Goal status: ONGOING ?  ?4.  Pt will attend to novel cognitive task for 8 minutes or greater with no cues for redirection. ?Baseline:  ?Goal status: ONGOING pt attends to task however with increased external conversations during task. ?  ?  ?  ?  ?LONG TERM GOALS: Target date: 07/29/2021 ?  ?Pt will be independent with updated HEP targeting progressions for patient. ?Baseline:  ?Goal status: INITIAL ?  ?2.  Pt will increase functional fine motor coordination in LUE by completing 9 hole peg test in 65 seconds or less. ?Baseline: 74s LUE ?Goal status: INITIAL ?  ?3.  Pt will perform simple warm meal prep and/or housekeeping with distant supervision for safety. ?Baseline:  ?Goal status: INITIAL ?  ?4.  Pt will perform environmental scanning in moderately distracting environment with 90% accuracy or greater. ?Baseline:          ?Goal status: INITIAL ?  ?5.  Pt will report using  simple hand tools with distant supervision (no power tools)  ?Baseline:  ?Goal status: INITIAL ?  ?ASSESSMENT: ?  ?CLINICAL IMPRESSION: ?Pt  with increased difficulty today with external distractions and questions/communication and attending to deductive reasoning puzzle and breaking down information. ?  ?PERFORMANCE DEFICITS in functional skills including ADLs, IADLs, coordination, dexterity, proprioception, sensation, ROM, strength, FMC, GMC, balance, decreased knowledge of use of DME, vision, and UE functional use, cognitive skills including attention, perception, problem solving, safety awareness, sequencing, temperament/personality, and thought, and psychosocial skills including coping strategies and habits.  ?  ?IMPAIRMENTS are limiting patient from ADLs, IADLs, work, and leisure.  ?  ?COMORBIDITIES may have co-morbidities  that affects occupational  performance. Patient will benefit from skilled OT to address above impairments and improve overall function. ?  ?  ?REHAB POTENTIAL: Good ?  ? ?  ?  ?  ?PLAN: ?OT FREQUENCY: 2x/week ?  ?OT DURATION: 10 weeks 16 visits over 10 weeks d/t a

## 2021-06-01 NOTE — Therapy (Signed)
?OUTPATIENT PHYSICAL THERAPY TREATMENT NOTE ? ? ?Patient Name: Jerry Richards ?MRN: [id number] ?DOB:[birth year], 51 y.o., male ?Today's Date: 06/01/2021 ? ?PCP: [name], MD ?REFERRING PROVIDER: [name], MD ? ? PT End of Session - 06/01/21 1325   ? ? Visit Number 5   ? Number of Visits 17   ? Date for PT Re-Evaluation 07/15/21   ? Authorization Type BCBS 2023  30% coinsur   ? PT Start Time 1318   ? PT Stop Time 1400   ? PT Time Calculation (min) 42 min   ? Equipment Utilized During Treatment Gait belt   ? Activity Tolerance Patient tolerated treatment well   ? Behavior During Therapy [facility] for tasks assessed/performed;Restless;Impulsive   ? ?  ?  ? ?  ? ? ? ?Past Medical History:  ?Diagnosis Date  ? Anxiety   ? Atrial fibrillation ([place])   ? a. s/p RFCA x 2 (10/26/2011, 04/15/2012)  ? Atrial flutter ([place])   ? Chronic kidney disease   ? Clotting disorder ([place])   ? Colitis   ? Current smoker   ? Diverticulitis   ? 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 LA thrombus   ? Non-ischemic cardiomyopathy ([place])   ? OSA on CPAP   ? Sinus trouble   ? Sleep apnea   ? Systolic heart failure ([place])   ? EF 20-25%-> 50-55% in 10/2011. MOST RECENT ECHO (03/2012) EF= 66%  ? ?Past Surgical History:  ?Procedure Laterality Date  ? APPLICATION OF CRANIAL NAVIGATION Right 05/16/2021  ? Procedure: APPLICATION OF CRANIAL NAVIGATION;  Surgeon: Earnie Larsson, MD;  Location: [place];  Service: Neurosurgery;  Laterality: Right;  ? ATRIAL ABLATION SURGERY    ? CARDIAC CATHETERIZATION  08/2011  ? [facility]  ? CHOLECYSTECTOMY    ? CRANIOTOMY Right 05/16/2021  ? Procedure: Craniotomy - right - Parietal with  brainlab;  Surgeon: Earnie Larsson, MD;  Location: [place];  Service: Neurosurgery;  Laterality: Right;  ? FINGER SURGERY    ? LEFT HEART CATHETERIZATION WITH CORONARY ANGIOGRAM N/A 09/12/2011  ? Procedure: LEFT HEART CATHETERIZATION WITH CORONARY ANGIOGRAM;  Surgeon: Hillary Bow, MD;  Location: [facility] CATH LAB;   Service: Cardiovascular;  Laterality: N/A;  ? VASECTOMY    ? ?Patient Active Problem List  ? Diagnosis Date Noted  ? Brain tumor ([place]) 05/16/2021  ? LLQ abdominal pain 08/13/2020  ? Diverticulitis of colon 08/13/2020  ? History of colonic polyps 08/13/2020  ? Sinus node dysfunction ([place]) 02/11/2014  ? Pulmonary vein stenosis 06/30/2013  ? S/P ablation of atrial fibrillation 02/25/2013  ? 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 Tobacco abuse 09/02/2012  ? Hyperthyroidism 09/02/2012  ? Palpitations 07/09/2012  ? Chronic anticoagulation 02/09/2012  ? Coronary artery disease excluded 02/09/2012  ? History of radiofrequency ablation for complex left atrial arrhythmia 02/09/2012  ? Atrial fibrillation with slow ventricular response ([place]) 02/09/2012  ? Tachycardia induced cardiomyopathy ([place]) 02/09/2012  ? Persistent atrial fibrillation ([place]) 10/26/2011  ? Sleep apnea 10/26/2011  ? Non-ischemic cardiomyopathy ([place]) 10/26/2011  ? Atrial fibrillation ([place]) 08/21/2011  ? Tachycardia induced cardiomyopathy ([place]) 08/21/2011  ? Atrial flutter ([place]) 08/21/2011  ? Obstructive sleep apnea 08/21/2011  ? FH: coronary artery disease 08/21/2011  ? Left atrial thrombus 06/10/2011  ? ? ?REFERRING DIAG: D49.6 (ICD-10-CM) - Brain tumor ([place])  ? ?THERAPY DIAG:  ?Muscle weakness (generalized) ? ?Unsteadiness on feet ? ?Other abnormalities of gait and mobility ? ?  Hemiplegia and hemiparesis following unspecified cerebrovascular disease affecting left non-dominant side ([place]) ? ?PERTINENT HISTORY: Per ED notes:  Pt presented to [facility] ED on 05/08/2021 for progressive L sided weakness and concerns over progressively worsening symptoms including left leg spasms, difficulty typing with his left hand, left face numbness.  He returned 05/09/2021 for follow-up MRI due to CT findings.  Pt underwent right craniotomy 05/16/2021 for biparietal craniotomy and resection of tumor using intraoperative stereotactic guidance. PMH:   hypothyroidism, cardiomyopathy, CAD, OSA, atrial flutter anticoagulated on Xarelto   ? ?PRECAUTIONS: Fall ? ?SUBJECTIVE: Feel like resting is improving.  Still trying to regulate the best daily schedule.  Going hard every day and having some left side muscular soreness.  Still some coming and going of the cold and foreign sensation in LLE. ? ?PAIN:  ?Are you having pain? Yes: NPRS scale: 4/10 ?Pain location: Left hemibody and L headache ?Pain description: "brain soreness" ?Aggravating factors: random ?Relieving factors: getting up and going   ? ? ? ?OBJECTIVE:  ?  ?DIAGNOSTIC FINDINGS: Head CT showed a paramedian mass at the right vertex, abutting the falx and MRI showed right parasagittal likely extra-axial tumor.  2.9 x 2.0 x 2.6 cm mass at the medial right frontoparietal vertex.  Post-resection  MRI of brain from 05/17/2021 shows vasogenic edema surrounding resection cavity without midline shift.  No acute infarct.  Concern for resection margins. ?  ?COGNITION: ?Overall cognitive status:  labile, pt is good historian -information verified by wife, easily frustrated due to physical deficits, distractible to light and sound, impulsive ?            ? ?  TODAY'S TREATMENT  ?Gait pattern: step through pattern, decreased step length- Left, decreased stance time- Left, Left steppage, Left foot flat, genu recurvatum- Left, wide BOS, and poor foot clearance- Left ?Distance walked: 450' level gym floor ?Assistive device utilized: None L foot up brace that pt owns ?Level of assistance: CGA and Min A ?Comments: Pt cued to decrease speed of gait to focus on L quad control and heel-toe gait.  Pt demos inc instability when focused on heel first w/ increased ataxia noted.  Trialed gait w/ 3lb ankle weight with pt able to regulate speed and ataxia with some improvement.  Pt continues to count aloud to regulate stepping strategy. ? ?Pt stepping to multi-colored dots using alternating steps and MinA for weight shifting forward w/  cues for LLE control.  Progressed to step-to to each dot with narrow BOS turns to face subsequent dot and step.  Pt demos inc instability with tight turns requiring minA to modA.  Continued NMR using multicolored cones for turning, pt weaving through 8 cones progressed to wide figure 8 with pt demonstrating inc step size and mild scissoring during turns requiring modA to prevent lateral LOB.  Used ladder to promote forward stepping with appropriate pacing and width.  Progressed to side stepping in ladder w/ pt using counting to regulate step width w/ facilitation to maintain forward pelvis. ? ? ? ?PATIENT EDUCATION: ?Education details: Edu on using foot up brace instead of ankle stabilizing brace pt has previously been using.  Edu on ambulating w/ and w/o cane at home only with wife using CGA/minA as explained and demonstrated throughout session.  Edu on inc step count each day to promote tolerance and dec fatigue over time.  Edu on update of AFO order with discussion of reaching out to Dr. Olena Heckle. ?Person educated: Patient ?Education method: Explanation ?Education comprehension: verbalized  understanding and needs further education ?  ?  ?HOME EXERCISE PROGRAM: ?Access Code: [email or web address] ?URL: [email or web address] ?Date: 05/25/2021 ?Prepared by: Elease Etienne ? ?Exercises ?- Tandem Walking with Counter Support  - 1 x daily - 4 x weekly - 3 sets - 10 reps -Cued to prevent crossing midline ?- Forward and backward walking with eyes open and counter support  - 1 x daily - 4 x weekly - 3 sets - 10 reps -trialed L and R arm support w/ inc sway when using L arm ?- Staggered Sit-to-Stand  - 1 x daily - 4 x weekly - 3 sets - 10 reps -each LE ? With walking tasks pt to focus on intentional steps with correction of foot placement during task before proceeding to next step ? Added walking program with inc step count to 2,00 steps per day with wife's assistance, may use cane inside or on deck, RW outside. ?   ?GOALS: ?Goals reviewed with patient? Yes ?  ?SHORT TERM GOALS: Target date:  06/17/2021 ?  ?Pt will be independent with strength and balance HEP with supervision from family. ?Baseline: To be initiated next session. ?Goal st

## 2021-06-01 NOTE — Telephone Encounter (Signed)
Phone call to Dr. Quillian Quince at [facility] with request for L AFO order, subsequent faxed request per MD request.  Wife to call and schedule appt. ?

## 2021-06-06 ENCOUNTER — Ambulatory Visit: Payer: BC Managed Care – PPO | Admitting: Occupational Therapy

## 2021-06-06 ENCOUNTER — Encounter: Payer: Self-pay | Admitting: Occupational Therapy

## 2021-06-06 ENCOUNTER — Inpatient Hospital Stay: Payer: BC Managed Care – PPO | Attending: Neurosurgery

## 2021-06-06 ENCOUNTER — Ambulatory Visit: Payer: BC Managed Care – PPO | Admitting: Physical Therapy

## 2021-06-06 ENCOUNTER — Encounter: Payer: Self-pay | Admitting: Physical Therapy

## 2021-06-06 DIAGNOSIS — I69954 Hemiplegia and hemiparesis following unspecified cerebrovascular disease affecting left non-dominant side: Secondary | ICD-10-CM

## 2021-06-06 DIAGNOSIS — R2689 Other abnormalities of gait and mobility: Secondary | ICD-10-CM

## 2021-06-06 DIAGNOSIS — R41842 Visuospatial deficit: Secondary | ICD-10-CM

## 2021-06-06 DIAGNOSIS — G2589 Other specified extrapyramidal and movement disorders: Secondary | ICD-10-CM | POA: Insufficient documentation

## 2021-06-06 DIAGNOSIS — G9389 Other specified disorders of brain: Secondary | ICD-10-CM | POA: Insufficient documentation

## 2021-06-06 DIAGNOSIS — I4891 Unspecified atrial fibrillation: Secondary | ICD-10-CM | POA: Insufficient documentation

## 2021-06-06 DIAGNOSIS — Z79899 Other long term (current) drug therapy: Secondary | ICD-10-CM | POA: Insufficient documentation

## 2021-06-06 DIAGNOSIS — R41840 Attention and concentration deficit: Secondary | ICD-10-CM

## 2021-06-06 DIAGNOSIS — M6281 Muscle weakness (generalized): Secondary | ICD-10-CM

## 2021-06-06 DIAGNOSIS — M21372 Foot drop, left foot: Secondary | ICD-10-CM

## 2021-06-06 DIAGNOSIS — Z8249 Family history of ischemic heart disease and other diseases of the circulatory system: Secondary | ICD-10-CM | POA: Insufficient documentation

## 2021-06-06 DIAGNOSIS — R2681 Unsteadiness on feet: Secondary | ICD-10-CM

## 2021-06-06 DIAGNOSIS — Z7952 Long term (current) use of systemic steroids: Secondary | ICD-10-CM | POA: Insufficient documentation

## 2021-06-06 DIAGNOSIS — Z808 Family history of malignant neoplasm of other organs or systems: Secondary | ICD-10-CM | POA: Insufficient documentation

## 2021-06-06 DIAGNOSIS — C711 Malignant neoplasm of frontal lobe: Secondary | ICD-10-CM | POA: Insufficient documentation

## 2021-06-06 DIAGNOSIS — F17210 Nicotine dependence, cigarettes, uncomplicated: Secondary | ICD-10-CM | POA: Insufficient documentation

## 2021-06-06 DIAGNOSIS — R569 Unspecified convulsions: Secondary | ICD-10-CM | POA: Insufficient documentation

## 2021-06-06 DIAGNOSIS — Z885 Allergy status to narcotic agent status: Secondary | ICD-10-CM | POA: Insufficient documentation

## 2021-06-06 DIAGNOSIS — R531 Weakness: Secondary | ICD-10-CM | POA: Insufficient documentation

## 2021-06-06 DIAGNOSIS — Z833 Family history of diabetes mellitus: Secondary | ICD-10-CM | POA: Insufficient documentation

## 2021-06-06 DIAGNOSIS — Z9049 Acquired absence of other specified parts of digestive tract: Secondary | ICD-10-CM | POA: Insufficient documentation

## 2021-06-06 DIAGNOSIS — R41844 Frontal lobe and executive function deficit: Secondary | ICD-10-CM

## 2021-06-06 DIAGNOSIS — D329 Benign neoplasm of meninges, unspecified: Secondary | ICD-10-CM | POA: Insufficient documentation

## 2021-06-06 NOTE — Therapy (Signed)
?OUTPATIENT PHYSICAL THERAPY TREATMENT NOTE ? ? ?Patient Name: Jerry Richards ?MRN: [id number] ?DOB:[birth year], 51 y.o., male ?Today's Date: 06/06/2021 ? ?PCP: Jerry Bis, MD ?REFERRING PROVIDER: [name], MD ? ? PT End of Session - 06/06/21 1714   ? ? Visit Number 6   ? Number of Visits 17   ? Date for PT Re-Evaluation 07/15/21   ? Authorization Type BCBS 2023  30% coinsur   ? PT Start Time 1532   ? PT Stop Time 1615   ? PT Time Calculation (min) 43 min   ? Equipment Utilized During Treatment Gait belt   ? Activity Tolerance Patient tolerated treatment well   ? Behavior During Therapy Jerry Richards for tasks assessed/performed;Restless;Impulsive   ? ?  ?  ? ?  ? ? ? ? ?Past Medical History:  ?Diagnosis Date  ? Anxiety   ? Atrial fibrillation ([place])   ? a. s/p RFCA x 2 (10/26/2011, 04/15/2012)  ? Atrial flutter ([place])   ? Chronic kidney disease   ? Clotting disorder ([place])   ? Colitis   ? Current smoker   ? Diverticulitis   ? 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 LA thrombus   ? Non-ischemic cardiomyopathy ([place])   ? OSA on CPAP   ? Sinus trouble   ? Sleep apnea   ? Systolic heart failure ([place])   ? EF 20-25%-> 50-55% in 10/2011. MOST RECENT ECHO (03/2012) EF= 66%  ? ?Past Surgical History:  ?Procedure Laterality Date  ? APPLICATION OF CRANIAL NAVIGATION Right 05/16/2021  ? Procedure: APPLICATION OF CRANIAL NAVIGATION;  Surgeon: Jerry Larsson, MD;  Location: [place];  Service: Neurosurgery;  Laterality: Right;  ? ATRIAL ABLATION SURGERY    ? CARDIAC CATHETERIZATION  08/2011  ? [facility]  ? CHOLECYSTECTOMY    ? CRANIOTOMY Right 05/16/2021  ? Procedure: Craniotomy - right - Parietal with  brainlab;  Surgeon: Jerry Larsson, MD;  Location: [place];  Service: Neurosurgery;  Laterality: Right;  ? FINGER SURGERY    ? LEFT HEART CATHETERIZATION WITH CORONARY ANGIOGRAM N/A 09/12/2011  ? Procedure: LEFT HEART CATHETERIZATION WITH CORONARY ANGIOGRAM;  Surgeon: Jerry Bow, MD;  Location: [facility] CATH LAB;   Service: Cardiovascular;  Laterality: N/A;  ? VASECTOMY    ? ?Patient Active Problem List  ? Diagnosis Date Noted  ? Brain tumor ([place]) 05/16/2021  ? LLQ abdominal pain 08/13/2020  ? Diverticulitis of colon 08/13/2020  ? History of colonic polyps 08/13/2020  ? Sinus node dysfunction ([place]) 02/11/2014  ? Pulmonary vein stenosis 06/30/2013  ? S/P ablation of atrial fibrillation 02/25/2013  ? 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 Tobacco abuse 09/02/2012  ? Hyperthyroidism 09/02/2012  ? Palpitations 07/09/2012  ? Chronic anticoagulation 02/09/2012  ? Coronary artery disease excluded 02/09/2012  ? History of radiofrequency ablation for complex left atrial arrhythmia 02/09/2012  ? Atrial fibrillation with slow ventricular response ([place]) 02/09/2012  ? Tachycardia induced cardiomyopathy ([place]) 02/09/2012  ? Persistent atrial fibrillation ([facility]) 10/26/2011  ? Sleep apnea 10/26/2011  ? Non-ischemic cardiomyopathy ([place]) 10/26/2011  ? Atrial fibrillation ([place]) 08/21/2011  ? Tachycardia induced cardiomyopathy ([place]) 08/21/2011  ? Atrial flutter ([place]) 08/21/2011  ? Obstructive sleep apnea 08/21/2011  ? FH: coronary artery disease 08/21/2011  ? Left atrial thrombus 06/10/2011  ? ? ?REFERRING DIAG: D49.6 (ICD-10-CM) - Brain tumor ([place])  ? ?THERAPY DIAG:  ?Muscle weakness (generalized) ? ?Unsteadiness on feet ? ?Hemiplegia and hemiparesis following unspecified  cerebrovascular disease affecting left non-dominant side ([place]) ? ?Other abnormalities of gait and mobility ? ?Foot drop, left ? ?PERTINENT HISTORY: Per ED notes:  Pt presented to [facility] on 05/08/2021 for progressive L sided weakness and concerns over progressively worsening symptoms including left leg spasms, difficulty typing with his left hand, left face numbness.  He returned 05/09/2021 for follow-up MRI due to CT findings.  Pt underwent right craniotomy 05/16/2021 for biparietal craniotomy and resection of tumor using intraoperative stereotactic guidance.  PMH:  hypothyroidism, cardiomyopathy, CAD, OSA, atrial flutter anticoagulated on Xarelto   ? ?PRECAUTIONS: Fall ? ?SUBJECTIVE: Wife states he is not resting and going 12 hours a day with exercise.  Pt states he is trying to rest, but is feeling obligated to entertain guests.  He has made his own gym in his shop to mimic therapy. ? ?PAIN:  ?Are you having pain? Yes: NPRS scale: 4/10 ?Pain location: Left hemibody and L headache ?Pain description: "brain soreness" ?Aggravating factors: random ?Relieving factors: getting up and going   ? ? ? ?OBJECTIVE:  ?  ?DIAGNOSTIC FINDINGS: Head CT showed a paramedian mass at the right vertex, abutting the falx and MRI showed right parasagittal likely extra-axial tumor.  2.9 x 2.0 x 2.6 cm mass at the medial right frontoparietal vertex.  Post-resection  MRI of brain from 05/17/2021 shows vasogenic edema surrounding resection cavity without midline shift.  No acute infarct.  Concern for resection margins. ?  ?COGNITION: ?Overall cognitive status:  labile, pt is good historian -information verified by wife, easily frustrated due to physical deficits, distractible to light and sound, impulsive ?            ? ?  TODAY'S TREATMENT  ?Pt's wife brought AFO order from MD, PT made copy to fax to [facility].  See edu. ?Squats to EOM x12 > use of green theraband around thigh to promote glut med engagement and prevent adduction collapse > RLE elevated on 2" step w/ pt performing squats w/ forward reach to floor to keep weight in toes x12; pt states weight in toes feels better due to tendency to curl when resting. ?Pt demonstrates squatting as he has been doing at work bench at home, PT corrects depth for safety w/ emphasis on using bench for UE support.  Pt demonstrates modified pushups from waist height countertop w/ bend at waist, pt uses to correct foot posture and warm-up in the morning.  Pt provides postural corrections at waist and emphasizes further incline for safety with modified  exercise. ?Made additions to HEP.  See below. ? ? ?PATIENT EDUCATION: ?Education details: Edu on seated stepper setup at home to promote proper knee alignment. Edu on resting to promote healing vs continuous activity and stimulation.  Emphasis on supervision for safety with activity especially when doing dynamic activity or long gait distances.  Edu on quality of movement over quantity and continued attempts to regulate sleeping pattern and quiet rest.  Wife brought in AFO order from primary care MD visit, discussed faxing copy to [facility] and that [facility] will contact them with further scheduling instructions.  Instructed pt to bring shoes for daughter's wedding to next visit to practice gait w/ and w/o AFO.  Additions to HEP. ?Person educated: Patient; Spouse ?Education method: Explanation; Handout ?Education comprehension: verbalized understanding and needs further education ?  ?  ?HOME EXERCISE PROGRAM: ?Access Code: [email or web address] ?URL: [email or web address] ?Date: 06/06/2021 ?Prepared by: Elease Etienne ? ?Exercises ?- Tandem Walking with Counter Support  - 1 x  daily - 4 x weekly - 3 sets - 10 reps ?- Forward and backward walking with eyes open and counter support  - 1 x daily - 4 x weekly - 3 sets - 10 reps ?- Staggered Sit-to-Stand  - 1 x daily - 4 x weekly - 3 sets - 10 reps ?- Seated Hamstring Stretch  - 1 x daily - 4 x weekly - 1 sets - 2 reps - 30-45 seconds hold ?- Seated Alternating Side Stretch with Arm Overhead  - 1 x daily - 4 x weekly - 1 sets - 3 reps - 30 seconds hold ?- Supine Hip Internal and External Rotation  - 1 x daily - 5 x weekly - 2 sets - 10 reps ?- Sidelying Thoracic Rotation with Open Book  - 1 x daily - 4 x weekly - 2 sets - 10 reps ?- Squat with Chair Touch and Resistance Loop  - 1 x daily - 4 x weekly - 2 sets - 10 reps ? With walking tasks pt to focus on intentional steps with correction of foot placement during task before proceeding to next step ? Added walking program  with inc step count to 2,00 steps per day with wife's assistance, may use cane inside or on deck, RW outside. ?  ?GOALS: ?Goals reviewed with patient? Yes ?  ?SHORT TERM GOALS: Target date:  06/17/2021 ?  ?Pt will

## 2021-06-06 NOTE — Patient Instructions (Addendum)
Access Code: [email or web address] ?URL: [email or web address] ?Date: 06/06/2021 ?Prepared by: Elease Etienne ? ?Exercises ?- Tandem Walking with Counter Support  - 1 x daily - 4 x weekly - 3 sets - 10 reps ?- Forward and backward walking with eyes open and counter support  - 1 x daily - 4 x weekly - 3 sets - 10 reps ?- Staggered Sit-to-Stand  - 1 x daily - 4 x weekly - 3 sets - 10 reps ?- Seated Hamstring Stretch  - 1 x daily - 4 x weekly - 1 sets - 2 reps - 30-45 seconds hold ?- Seated Alternating Side Stretch with Arm Overhead  - 1 x daily - 4 x weekly - 1 sets - 3 reps - 30 seconds hold ?- Supine Hip Internal and External Rotation  - 1 x daily - 5 x weekly - 2 sets - 10 reps ?- Sidelying Thoracic Rotation with Open Book  - 1 x daily - 4 x weekly - 2 sets - 10 reps ?- Squat with Chair Touch and Resistance Loop  - 1 x daily - 4 x weekly - 2 sets - 10 reps ?

## 2021-06-06 NOTE — Therapy (Signed)
?OUTPATIENT OCCUPATIONAL THERAPY TREATMENT NOTE ? ? ?Patient Name: Jerry Richards ?MRN: [id number] ?DOB:August 16, 1970, 51 y.o., male ?Today's Date: 06/06/2021 ? ?PCP: [name], MD ?REFERRING PROVIDER: [name], MD ? ? OT End of Session - 06/06/21 1620   ? ? Visit Number 6   ? Number of Visits 17   ? Date for OT Re-Evaluation 07/29/21   ? Authorization Type BCBS   ? Authorization Time Period VL:[place]  No Auth   ? OT Start Time 1620   ? OT Stop Time 1700   ? OT Time Calculation (min) 40 min   ? Activity Tolerance Patient tolerated treatment well   ? Behavior During Therapy [facility] for tasks assessed/performed   moves very quickly  ? ?  ?  ? ?  ? ? ? ? ?Past Medical History:  ?Diagnosis Date  ? Anxiety   ? Atrial fibrillation ([place])   ? a. s/p RFCA x 2 (10/26/2011, 04/15/2012)  ? Atrial flutter ([place])   ? Chronic kidney disease   ? Clotting disorder ([place])   ? Colitis   ? Current smoker   ? Diverticulitis   ? 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 LA thrombus   ? Non-ischemic cardiomyopathy ([place])   ? OSA on CPAP   ? Sinus trouble   ? Sleep apnea   ? Systolic heart failure ([place])   ? EF 20-25%-> 50-55% in 10/2011. MOST RECENT ECHO (03/2012) EF= 66%  ? ?Past Surgical History:  ?Procedure Laterality Date  ? APPLICATION OF CRANIAL NAVIGATION Right 05/16/2021  ? Procedure: APPLICATION OF CRANIAL NAVIGATION;  Surgeon: Earnie Larsson, MD;  Location: [place];  Service: Neurosurgery;  Laterality: Right;  ? ATRIAL ABLATION SURGERY    ? CARDIAC CATHETERIZATION  08/2011  ? [facility]  ? CHOLECYSTECTOMY    ? CRANIOTOMY Right 05/16/2021  ? Procedure: Craniotomy - right - Parietal with  brainlab;  Surgeon: Earnie Larsson, MD;  Location: [place];  Service: Neurosurgery;  Laterality: Right;  ? FINGER SURGERY    ? LEFT HEART CATHETERIZATION WITH CORONARY ANGIOGRAM N/A 09/12/2011  ? Procedure: LEFT HEART CATHETERIZATION WITH CORONARY ANGIOGRAM;  Surgeon: Hillary Bow, MD;  Location: [facility] CATH LAB;  Service:  Cardiovascular;  Laterality: N/A;  ? VASECTOMY    ? ?Patient Active Problem List  ? Diagnosis Date Noted  ? Brain tumor ([place]) 05/16/2021  ? LLQ abdominal pain 08/13/2020  ? Diverticulitis of colon 08/13/2020  ? History of colonic polyps 08/13/2020  ? Sinus node dysfunction ([place]) 02/11/2014  ? Pulmonary vein stenosis 06/30/2013  ? S/P ablation of atrial fibrillation 02/25/2013  ? 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 Tobacco abuse 09/02/2012  ? Hyperthyroidism 09/02/2012  ? Palpitations 07/09/2012  ? Chronic anticoagulation 02/09/2012  ? Coronary artery disease excluded 02/09/2012  ? History of radiofrequency ablation for complex left atrial arrhythmia 02/09/2012  ? Atrial fibrillation with slow ventricular response ([place]) 02/09/2012  ? Tachycardia induced cardiomyopathy ([place]) 02/09/2012  ? Persistent atrial fibrillation ([place]) 10/26/2011  ? Sleep apnea 10/26/2011  ? Non-ischemic cardiomyopathy ([place]) 10/26/2011  ? Atrial fibrillation ([place]) 08/21/2011  ? Tachycardia induced cardiomyopathy ([place]) 08/21/2011  ? Atrial flutter ([place]) 08/21/2011  ? Obstructive sleep apnea 08/21/2011  ? FH: coronary artery disease 08/21/2011  ? Left atrial thrombus 06/10/2011  ? ? ?ONSET DATE: 03/22ONSET DATE: 05/18/2021  ?  ?REFERRING DIAG: D49.6 (ICD-10-CM) - Brain tumor ([place]) ? ? ?THERAPY DIAG:  ?Muscle weakness (generalized) ? ?  Unsteadiness on feet ? ?Frontal lobe and executive function deficit ? ?Attention and concentration deficit ? ?Visuospatial deficit ? ?Hemiplegia and hemiparesis following unspecified cerebrovascular disease affecting left non-dominant side ([place]) ? ?Other abnormalities of gait and mobility ? ? ?PERTINENT HISTORY: Biparietal craniotomy and resection of tumor on 3/20. PMH including anxiety, a-fib, and L heart cath (2013).   ? ?PRECAUTIONS: fall, goes by "[name]" ? ?SUBJECTIVE: "That rain really did something to me" ? ?PAIN:  ?Are you having pain? Yes: NPRS scale: 5/10 ?Pain location: headache ?Pain  description: headache ?Aggravating factors: busy - going ?Relieving factors: resting ? ? ?  ? ?OBJECTIVE:  ? ?TODAY'S TREATMENT: ? ?06/06/21 ? ?Deductive Reasoning/Logic Puzzle (halloween costumes) with min/mod assistance for solving and attending to details. Pt with much improvement with this task compared to last session with organizing the clues.  ? ?Nuts and Bolts with unscrewing with the LUE with cues for recalling to use LUE for unscrewing the bolts with vision occluded. Pt with good coordination and attention to details of the nuts and bolts, a familiar task for him. Pt was able to place them into the correct places with no difficulty and with good time. Pt with external conversation throughout activity. ? ?Blink with one person trying to get rid of cards by matching by shape, color and number with min cues for recalling categories for matching.  ? ? ? ? ? ?  ?GOALS: ?Goals reviewed with patient? Yes ?  ?SHORT TERM GOALS: Target date: 06/17/2021 ?  ?Pt will be independent with HEP targeting coordination and proximal strength in LUE ?Baseline:  ?Goal status: Achieved ?  ?2.  Pt will improve functional gross motor coordination with LUE by increasing Box and Blocks score by 3 blocks or more. ?Baseline: 28 blocks LUE, 52 RUE ?Goal status: ONGOING improved coordination - continue to assess goal ?  ?3.  Pt will increase dynamic balance and safety with standing task for increasing independence with ADLs to set up and distant supervision. ?Baseline: supervision - min A for safety d/t unsteadiness ?Goal status: ONGOING ?  ?4.  Pt will attend to novel cognitive task for 8 minutes or greater with no cues for redirection. ?Baseline:  ?Goal status: Achieved pt attends to task however with increased external conversations during task. ?  ?HOME EXERCISE PROGRAM: ? ?  ?  ?LONG TERM GOALS: Target date: 07/29/2021 ?  ?Pt will be independent with updated HEP targeting progressions for patient. ?Baseline:  ?Goal status: ONGOING ?   ?2.  Pt will increase functional fine motor coordination in LUE by completing 9 hole peg test in 65 seconds or less. ?Baseline: 74s LUE ?Goal status: ONGOING ?  ?3.  Pt will perform simple warm meal prep and/or housekeeping with distant supervision for safety. ?Baseline:  ?Goal status: ONGOING ?  ?4.  Pt will perform environmental scanning in moderately distracting environment with 90% accuracy or greater. ?Baseline:          ?Goal status: INITIAL ?  ?5.  Pt will report using simple hand tools with distant supervision (no power tools)  ?Baseline:  ?Goal status: Achieved ?  ?ASSESSMENT: ?  ?CLINICAL IMPRESSION: ?Pt continues to progress with LUE coordination and strength. Pt reports some difficulty with fatiguing tasks. Pt continues to work towards remaining goals ?  ?PERFORMANCE DEFICITS in functional skills including ADLs, IADLs, coordination, dexterity, proprioception, sensation, ROM, strength, FMC, GMC, balance, decreased knowledge of use of DME, vision, and UE functional use, cognitive skills including attention, perception, problem solving,  safety awareness, sequencing, temperament/personality, and thought, and psychosocial skills including coping strategies and habits.  ?  ?IMPAIRMENTS are limiting patient from ADLs, IADLs, work, and leisure.  ?  ?COMORBIDITIES may have co-morbidities  that affects occupational performance. Patient will benefit from skilled OT to address above impairments and improve overall function. ?  ?  ?REHAB POTENTIAL: Good ?  ? ?  ?  ?  ?PLAN: ?OT FREQUENCY: 2x/week ?  ?OT DURATION: 10 weeks 16 visits over 10 weeks d/t any scheduling conflicts ?  ?PLANNED INTERVENTIONS: self care/ADL training, therapeutic exercise, therapeutic activity, neuromuscular re-education, manual therapy, aquatic therapy, ultrasound, patient/family education, cognitive remediation/compensation, visual/perceptual remediation/compensation, coping strategies training, and DME and/or AE instructions ?  ?RECOMMENDED  OTHER SERVICES: possible benefit from ST evaluation ?  ?CONSULTED AND AGREED WITH PLAN OF CARE: Patient  ?  ?PLAN FOR NEXT SESSION: functional mobility, dynamic balance, visual processing, coordination and functional

## 2021-06-07 ENCOUNTER — Other Ambulatory Visit: Payer: Self-pay | Admitting: Radiation Therapy

## 2021-06-07 ENCOUNTER — Inpatient Hospital Stay: Payer: BC Managed Care – PPO | Admitting: Internal Medicine

## 2021-06-07 ENCOUNTER — Other Ambulatory Visit: Payer: Self-pay

## 2021-06-07 VITALS — BP 122/81 | HR 68 | Temp 98.1°F | Resp 16 | Ht 72.0 in | Wt 209.6 lb

## 2021-06-07 DIAGNOSIS — Z885 Allergy status to narcotic agent status: Secondary | ICD-10-CM | POA: Diagnosis not present

## 2021-06-07 DIAGNOSIS — C711 Malignant neoplasm of frontal lobe: Secondary | ICD-10-CM

## 2021-06-07 DIAGNOSIS — Z808 Family history of malignant neoplasm of other organs or systems: Secondary | ICD-10-CM

## 2021-06-07 DIAGNOSIS — G9389 Other specified disorders of brain: Secondary | ICD-10-CM | POA: Diagnosis not present

## 2021-06-07 DIAGNOSIS — R569 Unspecified convulsions: Secondary | ICD-10-CM

## 2021-06-07 DIAGNOSIS — Z9049 Acquired absence of other specified parts of digestive tract: Secondary | ICD-10-CM | POA: Diagnosis not present

## 2021-06-07 DIAGNOSIS — R531 Weakness: Secondary | ICD-10-CM

## 2021-06-07 DIAGNOSIS — C719 Malignant neoplasm of brain, unspecified: Secondary | ICD-10-CM

## 2021-06-07 DIAGNOSIS — F17210 Nicotine dependence, cigarettes, uncomplicated: Secondary | ICD-10-CM

## 2021-06-07 DIAGNOSIS — Z833 Family history of diabetes mellitus: Secondary | ICD-10-CM | POA: Diagnosis not present

## 2021-06-07 DIAGNOSIS — Z8249 Family history of ischemic heart disease and other diseases of the circulatory system: Secondary | ICD-10-CM | POA: Diagnosis not present

## 2021-06-07 DIAGNOSIS — I4891 Unspecified atrial fibrillation: Secondary | ICD-10-CM | POA: Diagnosis not present

## 2021-06-07 DIAGNOSIS — Z7952 Long term (current) use of systemic steroids: Secondary | ICD-10-CM | POA: Diagnosis not present

## 2021-06-07 DIAGNOSIS — D329 Benign neoplasm of meninges, unspecified: Secondary | ICD-10-CM | POA: Diagnosis not present

## 2021-06-07 DIAGNOSIS — G2589 Other specified extrapyramidal and movement disorders: Secondary | ICD-10-CM | POA: Diagnosis not present

## 2021-06-07 DIAGNOSIS — Z79899 Other long term (current) drug therapy: Secondary | ICD-10-CM | POA: Diagnosis not present

## 2021-06-07 MED ORDER — DEXAMETHASONE 2 MG PO TABS: 2.0000 mg | ORAL_TABLET | Freq: Every day | ORAL | Status: DC

## 2021-06-07 NOTE — Progress Notes (Signed)
? ?[name]  ?[place], [place] [address] ?[phone] ? ? ?New Patient Evaluation ? ?Date of Service: 06/07/21 ?Patient Name: Jerry Richards ?Patient MRN: [id number] ?Patient DOB: Jul 10, 1970 ?Provider: Ventura Sellers, MD ? ?Identifying Statement:  ?Jerry Richards is a 51 y.o. male with right frontal glioblastoma who presents for initial consultation and evaluation.   ? ?Referring Provider: ?Earnie Larsson, MD ?[address] ?[address] ?[place],  [place] [address] ? ?Oncologic History: ?Oncology History  ?High grade glioma not classifiable by WHO criteria ([place])  ?05/16/2021 Surgery  ? Craniotomy, R parafalcine resection with Dr. Annette Stable; path is high grade glioma IDHwt ?  ? ? ?Biomarkers: ? ?MGMT Unknown.  ?IDH 1/2 Wild type.  ?EGFR Unknown  ?TERT Unknown  ? ?History of Present Illness: ?The patient's records from the referring physician were obtained and reviewed and the patient interviewed to confirm this HPI.  Jerry Richards presented to neurologic attention in March with several days of progressive left sided weakness and seizures, characterized by twitching of the left leg.  CNS imaging demonstrated a right frontal mass.  He underwent craniotomy and resection with Dr. Annette Stable on 05/16/21; path demonstrated high grade glioma IDHwt.  Following surgery his left leg was much weaker, though it has improved with aggressive rehab efforts.  Currently he is using a cane to ambulate.  He has no other complaints, continues on decadron 86m twice per day.  No seizures since surgery. ? ?Medications: ?Current Outpatient Medications on File Prior to Visit  ?Medication Sig Dispense Refill  ? acetaminophen (TYLENOL) 500 MG tablet Take 1,000 mg by mouth every 6 (six) hours as needed for moderate pain.    ? dexamethasone (DECADRON) 4 MG tablet Take 1 tablet (4 mg total) by mouth 4 (four) times daily. (Patient taking differently: Take 2 mg by mouth 4 (four) times daily.) 60 tablet 0   ? HYDROcodone-acetaminophen (NORCO/VICODIN) 5-325 MG tablet Take 1 tablet by mouth every 4 (four) hours as needed for moderate pain. 30 tablet 0  ? levETIRAcetam (KEPPRA) 500 MG tablet Take 2 tablets (1,000 mg total) by mouth 2 (two) times daily. 120 tablet 2  ? losartan (COZAAR) 50 MG tablet Take 50 mg by mouth daily.  6  ? mirtazapine (REMERON) 15 MG tablet Take 15 mg by mouth at bedtime.    ? baclofen (LIORESAL) 10 MG tablet TAKE 1 TABLET BY MOUTH THREE TIMES A DAY (Patient not taking: Reported on 06/01/2021) 90 tablet 0  ? gabapentin (NEURONTIN) 100 MG capsule Take 100 mg by mouth 3 (three) times daily. (Patient not taking: Reported on 06/01/2021)    ? ?No current facility-administered medications on file prior to visit.  ? ? ?Allergies:  ?Allergies  ?Allergen Reactions  ? Amiodarone   ?  AMIODARONE ANALOGUES ?Hyperthyroidism  ? Morphine And Related Palpitations  ?  Chest pressure/palpitations  ? ?Past Medical History:  ?Past Medical History:  ?Diagnosis Date  ? Anxiety   ? Atrial fibrillation ([place]   ? a. s/p RFCA x 2 (10/26/2011, 04/15/2012)  ? Atrial flutter ([place]   ? Chronic kidney disease   ? Clotting disorder ([place]   ? Colitis   ? Current smoker   ? Diverticulitis   ? 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 LA thrombus   ? Non-ischemic cardiomyopathy ([place]   ? OSA on CPAP   ? Sinus trouble   ?  Sleep apnea   ? Systolic heart failure ([place])   ? EF 20-25%-> 50-55% in 10/2011. MOST RECENT ECHO (03/2012) EF= 66%  ? ?Past Surgical History:  ?Past Surgical History:  ?Procedure Laterality Date  ? APPLICATION OF CRANIAL NAVIGATION Right 05/16/2021  ? Procedure: APPLICATION OF CRANIAL NAVIGATION;  Surgeon: Earnie Larsson, MD;  Location: [place];  Service: Neurosurgery;  Laterality: Right;  ? ATRIAL ABLATION SURGERY    ? CARDIAC CATHETERIZATION  08/2011  ? [facility]  ? CHOLECYSTECTOMY    ? CRANIOTOMY Right 05/16/2021  ? Procedure: Craniotomy - right - Parietal with  brainlab;  Surgeon: Earnie Larsson, MD;  Location: [place];  Service: Neurosurgery;  Laterality: Right;  ? FINGER SURGERY    ? LEFT HEART CATHETERIZATION WITH CORONARY ANGIOGRAM N/A 09/12/2011  ? Procedure: LEFT HEART CATHETERIZATION WITH CORONARY ANGIOGRAM;  Surgeon: Hillary Bow, MD;  Location: [facility] CATH LAB;  Service: Cardiovascular;  Laterality: N/A;  ? VASECTOMY    ? ?Social History:  ?Social History  ? ?Socioeconomic History  ? Marital status: Married  ?  Spouse name: tanya  ? Number of children: 2  ? Years of education: Not on file  ? Highest education level: Some college, no degree  ?Occupational History  ? Occupation: SELF EMPLOYED  ?  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 Smoking status: Every Day  ?  Packs/day: 1.00  ?  Years: 20.00  ?  Pack years: 20.00  ?  Types: Cigarettes  ? Smokeless tobacco: Never  ?Vaping Use  ? Vaping Use: Never used  ?Substance and Sexual Activity  ? Alcohol use: Yes  ?  Comment: drinks socially  ? Drug use: No  ? Sexual activity: Yes  ?Other Topics Concern  ? Not on file  ?Social History Narrative  ? Has 2 children  ? 3 years of college  ? Right handed  ? Caffeine: 2.5 cups of coffee AM. Occas tea  ? ?Social Determinants of Health  ? ?Financial Resource Strain: Not on file  ?Food Insecurity: Not on file  ?Transportation Needs: Not on file  ?Physical Activity: Not on file  ?Stress: Not on file  ?Social Connections: Not on file  ?Intimate Partner Violence: Not on file  ? ?Family History:  ?Family History  ?Problem Relation Age of Onset  ? Healthy Mother   ?     age 3  ? Cancer Mother   ?     melanoma  ? Atrial fibrillation Father   ?     age 29  ? Cancer Father   ?     melanoma  ? Atrial fibrillation Paternal Aunt   ? Atrial fibrillation Paternal Uncle   ? Diabetes Maternal Grandmother   ? Coronary artery disease Paternal Grandmother   ? Heart disease Paternal Grandmother   ? Heart attack Paternal Grandmother   ? Coronary artery disease Paternal Grandfather   ? Heart disease Paternal  Grandfather   ? Heart attack Paternal Grandfather   ? Colon cancer Neg Hx   ? Esophageal cancer Neg Hx   ? Ovarian cancer Neg Hx   ? Stomach cancer Neg Hx   ? Rectal cancer Neg Hx   ? ? ?Review of Systems: ?Constitutional: Doesn't report fevers, chills or abnormal weight loss ?Eyes: Doesn't report blurriness of vision ?Ears, nose, mouth, throat, and face: Doesn't report sore throat ?Respiratory: Doesn't report cough, dyspnea or wheezes ?Cardiovascular: Doesn't report palpitation, chest discomfort  ?Gastrointestinal:  Doesn't report nausea,  constipation, diarrhea ?GU: Doesn't report incontinence ?Skin: Doesn't report skin rashes ?Neurological: Per HPI ?Musculoskeletal: Doesn't report joint pain ?Behavioral/Psych: Doesn't report anxiety ? ?Physical Exam: ?Vitals:  ? 06/07/21 1139  ?BP: 122/81  ?Pulse: 68  ?Resp: 16  ?Temp: 98.1 ?F (36.7 ?C)  ?SpO2: 98%  ? ?KPS: 80. ?General: Alert, cooperative, pleasant, in no acute distress ?Head: Normal ?EENT: No conjunctival injection or scleral icterus.  ?Lungs: Resp effort normal ?Cardiac: Regular rate ?Abdomen: Non-distended abdomen ?Skin: No rashes cyanosis or petechiae. ?Extremities: No clubbing or edema ? ?Neurologic Exam: ?Mental Status: Awake, alert, attentive to examiner. Oriented to self and environment. Language is fluent with intact comprehension.  ?Cranial Nerves: Visual acuity is grossly normal. Visual fields are full. Extra-ocular movements intact. No ptosis. Face is symmetric ?Motor: Tone and bulk are normal. Power is 4/5 in left leg, 5/5 otherwise. Reflexes are symmetric, no pathologic reflexes present.  ?Sensory: Intact to light touch ?Gait: Hemiparetic, cane assisted. ? ? ?Labs: ?I have reviewed the data as listed ?   ?Component Value Date/Time  ? NA 135 05/17/2021 0523  ? NA 140 03/29/2017 1045  ? K 4.4 05/17/2021 0523  ? CL 106 05/17/2021 0523  ? CO2 21 (L) 05/17/2021 0523  ? GLUCOSE 133 (H) 05/17/2021 0523  ? BUN 25 (H) 05/17/2021 0523  ? BUN 18 03/29/2017  1045  ? CREATININE 1.15 05/17/2021 0523  ? CALCIUM 8.1 (L) 05/17/2021 0523  ? PROT 7.6 05/08/2021 2133  ? PROT 7.7 03/29/2017 1045  ? ALBUMIN 4.4 05/08/2021 2133  ? ALBUMIN 5.2 03/29/2017 1045  ? AST 15 05/08/2021

## 2021-06-08 ENCOUNTER — Ambulatory Visit: Payer: BC Managed Care – PPO | Admitting: Physical Therapy

## 2021-06-08 ENCOUNTER — Ambulatory Visit: Payer: BC Managed Care – PPO | Admitting: Occupational Therapy

## 2021-06-08 ENCOUNTER — Encounter: Payer: Self-pay | Admitting: Occupational Therapy

## 2021-06-08 ENCOUNTER — Encounter: Payer: Self-pay | Admitting: Physical Therapy

## 2021-06-08 DIAGNOSIS — I69954 Hemiplegia and hemiparesis following unspecified cerebrovascular disease affecting left non-dominant side: Secondary | ICD-10-CM

## 2021-06-08 DIAGNOSIS — R2689 Other abnormalities of gait and mobility: Secondary | ICD-10-CM

## 2021-06-08 DIAGNOSIS — R2681 Unsteadiness on feet: Secondary | ICD-10-CM

## 2021-06-08 DIAGNOSIS — M6281 Muscle weakness (generalized): Secondary | ICD-10-CM

## 2021-06-08 DIAGNOSIS — M21372 Foot drop, left foot: Secondary | ICD-10-CM

## 2021-06-08 DIAGNOSIS — R41842 Visuospatial deficit: Secondary | ICD-10-CM

## 2021-06-08 DIAGNOSIS — R41844 Frontal lobe and executive function deficit: Secondary | ICD-10-CM

## 2021-06-08 DIAGNOSIS — R41840 Attention and concentration deficit: Secondary | ICD-10-CM

## 2021-06-08 NOTE — Therapy (Signed)
?OUTPATIENT PHYSICAL THERAPY TREATMENT NOTE ? ? ?Patient Name: Jerry Richards ?MRN: [id number] ?DOB:May 09, 1970, 51 y.o., male ?Today's Date: 06/08/2021 ? ?PCP: Jerry Bis, MD ?REFERRING PROVIDER: [name], MD ? ? PT End of Session - 06/08/21 1535   ? ? Visit Number 7   ? Number of Visits 17   ? Date for PT Re-Evaluation 07/15/21   ? Authorization Type BCBS 2023  30% coinsur   ? PT Start Time 1535   ? PT Stop Time 1615   ? PT Time Calculation (min) 40 min   ? Equipment Utilized During Treatment Gait belt   ? Activity Tolerance Patient tolerated treatment well   ? Behavior During Therapy [facility] for tasks assessed/performed;Restless;Impulsive   ? ?  ?  ? ?  ? ? ? ? ?Past Medical History:  ?Diagnosis Date  ? Anxiety   ? Atrial fibrillation ([place])   ? a. s/p RFCA x 2 (10/26/2011, 04/15/2012)  ? Atrial flutter ([place])   ? Chronic kidney disease   ? Clotting disorder ([place])   ? Colitis   ? Current smoker   ? Diverticulitis   ? 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 LA thrombus   ? Non-ischemic cardiomyopathy ([place])   ? OSA on CPAP   ? Sinus trouble   ? Sleep apnea   ? Systolic heart failure ([place])   ? EF 20-25%-> 50-55% in 10/2011. MOST RECENT ECHO (03/2012) EF= 66%  ? ?Past Surgical History:  ?Procedure Laterality Date  ? APPLICATION OF CRANIAL NAVIGATION Right 05/16/2021  ? Procedure: APPLICATION OF CRANIAL NAVIGATION;  Surgeon: Earnie Larsson, MD;  Location: [place];  Service: Neurosurgery;  Laterality: Right;  ? ATRIAL ABLATION SURGERY    ? CARDIAC CATHETERIZATION  08/2011  ? [facility]  ? CHOLECYSTECTOMY    ? CRANIOTOMY Right 05/16/2021  ? Procedure: Craniotomy - right - Parietal with  brainlab;  Surgeon: Earnie Larsson, MD;  Location: [place];  Service: Neurosurgery;  Laterality: Right;  ? FINGER SURGERY    ? LEFT HEART CATHETERIZATION WITH CORONARY ANGIOGRAM N/A 09/12/2011  ? Procedure: LEFT HEART CATHETERIZATION WITH CORONARY ANGIOGRAM;  Surgeon: Hillary Bow, MD;  Location: [facility] C[facility] LAB;   Service: Cardiovascular;  Laterality: N/A;  ? VASECTOMY    ? ?Patient Active Problem List  ? Diagnosis Date Noted  ? High grade glioma not classifiable by WHO criteria ([place]) 05/16/2021  ? LLQ abdominal pain 08/13/2020  ? Diverticulitis of colon 08/13/2020  ? History of colonic polyps 08/13/2020  ? Sinus node dysfunction ([place]) 02/11/2014  ? Pulmonary vein stenosis 06/30/2013  ? S/P ablation of atrial fibrillation 02/25/2013  ? 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 Tobacco abuse 09/02/2012  ? Hyperthyroidism 09/02/2012  ? Palpitations 07/09/2012  ? Chronic anticoagulation 02/09/2012  ? Coronary artery disease excluded 02/09/2012  ? History of radiofrequency ablation for complex left atrial arrhythmia 02/09/2012  ? Atrial fibrillation with slow ventricular response ([place]) 02/09/2012  ? Tachycardia induced cardiomyopathy ([place]) 02/09/2012  ? Persistent atrial fibrillation ([place]) 10/26/2011  ? Sleep apnea 10/26/2011  ? Non-ischemic cardiomyopathy ([place]) 10/26/2011  ? Atrial fibrillation ([place]) 08/21/2011  ? Tachycardia induced cardiomyopathy ([place]) 08/21/2011  ? Atrial flutter ([place]) 08/21/2011  ? Obstructive sleep apnea 08/21/2011  ? FH: coronary artery disease 08/21/2011  ? Left atrial thrombus 06/10/2011  ? ? ?REFERRING DIAG: D49.6 (ICD-10-CM) - Brain tumor ([place])  ? ?THERAPY DIAG:  ?Muscle weakness (generalized) ? ?Unsteadiness on feet ? ?  Hemiplegia and hemiparesis following unspecified cerebrovascular disease affecting left non-dominant side ([place]) ? ?Other abnormalities of gait and mobility ? ?Foot drop, left ? ?PERTINENT HISTORY: Per ED notes:  Pt presented to [facility] ED on 05/08/2021 for progressive L sided weakness and concerns over progressively worsening symptoms including left leg spasms, difficulty typing with his left hand, left face numbness.  He returned 05/09/2021 for follow-up MRI due to CT findings.  Pt underwent right craniotomy 05/16/2021 for biparietal craniotomy and resection of tumor using  intraoperative stereotactic guidance. PMH:  hypothyroidism, cardiomyopathy, CAD, OSA, atrial flutter anticoagulated on Xarelto   ? ?PRECAUTIONS: Fall ? ?SUBJECTIVE: Wife states he fell x1 when lifting and carrying a heavy object yesterday.  He was not hurt, but this was in addition to twisting left ankle earlier in the week.  He is having increased painful sensations in LLE with a feeling that something is in his shoe all the time.  He states he continues to have issue with the left toes curling under even when weight bearing on the LLE. ? ?PAIN:  ?Are you having pain? Yes: NPRS scale: 4/10 ?Pain location: Left hemibody and L headache ?Pain description: "brain soreness" ?Aggravating factors: random ?Relieving factors: getting up and going   ? ? ? ?OBJECTIVE:  ?  ?DIAGNOSTIC FINDINGS: Head CT showed a paramedian mass at the right vertex, abutting the falx and MRI showed right parasagittal likely extra-axial tumor.  2.9 x 2.0 x 2.6 cm mass at the medial right frontoparietal vertex.  Post-resection  MRI of brain from 05/17/2021 shows vasogenic edema surrounding resection cavity without midline shift.  No acute infarct.  Concern for resection margins. ?  ?COGNITION: ?Overall cognitive status:  labile, pt is good historian -information verified by wife, easily frustrated due to physical deficits, distractible to light and sound, impulsive ?            ? ?  TODAY'S TREATMENT  ?Time spent donning AFO in dress shoes to mimic attire for upcoming wedding this weekend-pt is FOB ?Pt ambulates 345' on level surface cued for dec heel strike to prevent rapid foot slap due to style of shoe.  Pt demonstrates inc high guard position of BUE due to dec weight of shoe and inc ataxia of LLE.  Pt cued to hum song or count in head to regulate stepping using tempo of wedding march to mimic activity. ?Pt performs lateral weight shifts w/ supervision in inc ROM to promote quad control ?Progressed to 4 way stepping w/ pt requiring one step  commands, visual cuing and demonstration from therapist, inc challenge with random direction of stepping.  Cued for large steps to inc hip strength challenge. ?Pt performs farmer's carry 100' using 10# kettlebell then 575' w/ 15# kettlebell, pt demos dec LLE ataxia and dec R lateral lean during stance. ?When progressed to bimanual 10lb crate carry 205' gait mechanics begin to break down, pt is unable to dual task in order to stabilize at hips and L knee while preventing crate from swaying under weight.  He becomes inc fatigued due to cognitive overload verbalizing his effort.   ?Kettlebell swing w/ 10lb, pt requires inc demonstration w/ difficulty maintaining form, fatigues quickly ? ?PATIENT EDUCATION: ?Education details: Edu on precautions with dress shoes for wedding this weekend due to heel height, decreased heel strike and difficulty pushing off toe due to toe box and length.  Discussed process for fitting for AFO and potential length of time awaiting permanent brace.  Discussed fall recovery  and prevention strategies relevant to pt's current functional status.  Discussed OT reaching out to MD in regards to Baclofen for toe curling.   ?Person educated: Patient; Spouse ?Education method: Explanation; Handout ?Education comprehension: verbalized understanding and needs further education ?  ?  ?HOME EXERCISE PROGRAM: ?Access Code: [email or web address] ?URL: [email or web address] ?Date: 06/06/2021 ?Prepared by: Elease Etienne ? ?Exercises ?- Tandem Walking with Counter Support  - 1 x daily - 4 x weekly - 3 sets - 10 reps ?- Forward and backward walking with eyes open and counter support  - 1 x daily - 4 x weekly - 3 sets - 10 reps ?- Staggered Sit-to-Stand  - 1 x daily - 4 x weekly - 3 sets - 10 reps ?- Seated Hamstring Stretch  - 1 x daily - 4 x weekly - 1 sets - 2 reps - 30-45 seconds hold ?- Seated Alternating Side Stretch with Arm Overhead  - 1 x daily - 4 x weekly - 1 sets - 3 reps - 30 seconds hold ?-  Supine Hip Internal and External Rotation  - 1 x daily - 5 x weekly - 2 sets - 10 reps ?- Sidelying Thoracic Rotation with Open Book  - 1 x daily - 4 x weekly - 2 sets - 10 reps ?- Squat with Chair Touch and

## 2021-06-08 NOTE — Therapy (Signed)
?OUTPATIENT OCCUPATIONAL THERAPY TREATMENT NOTE ? ? ?Patient Name: Jerry Richards ?MRN: [id number] ?DOB:November 26, 1970, 51 y.o., male ?Today's Date: 06/08/2021 ? ?PCP: Jerry Bis, MD ?REFERRING PROVIDER: [name], MD ? ? OT End of Session - 06/08/21 1555   ? ? Visit Number 7   ? Number of Visits 17   ? Date for OT Re-Evaluation 07/29/21   ? Authorization Type BCBS   ? Authorization Time Period VL:[place]  No Auth   ? OT Start Time 4315   ? OT Stop Time 1530   ? OT Time Calculation (min) 45 min   ? Activity Tolerance Patient tolerated treatment well   ? Behavior During Therapy [facility] for tasks assessed/performed   ? ?  ?  ? ?  ? ? ? ? ?Past Medical History:  ?Diagnosis Date  ? Anxiety   ? Atrial fibrillation ([place])   ? a. s/p RFCA x 2 (10/26/2011, 04/15/2012)  ? Atrial flutter ([place])   ? Chronic kidney disease   ? Clotting disorder ([place])   ? Colitis   ? Current smoker   ? Diverticulitis   ? 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 LA thrombus   ? Non-ischemic cardiomyopathy ([place])   ? OSA on CPAP   ? Sinus trouble   ? Sleep apnea   ? Systolic heart failure ([place])   ? EF 20-25%-> 50-55% in 10/2011. MOST RECENT ECHO (03/2012) EF= 66%  ? ?Past Surgical History:  ?Procedure Laterality Date  ? APPLICATION OF CRANIAL NAVIGATION Right 05/16/2021  ? Procedure: APPLICATION OF CRANIAL NAVIGATION;  Surgeon: Earnie Larsson, MD;  Location: [place];  Service: Neurosurgery;  Laterality: Right;  ? ATRIAL ABLATION SURGERY    ? CARDIAC CATHETERIZATION  08/2011  ? [facility]  ? CHOLECYSTECTOMY    ? CRANIOTOMY Right 05/16/2021  ? Procedure: Craniotomy - right - Parietal with  brainlab;  Surgeon: Earnie Larsson, MD;  Location: [place];  Service: Neurosurgery;  Laterality: Right;  ? FINGER SURGERY    ? LEFT HEART CATHETERIZATION WITH CORONARY ANGIOGRAM N/A 09/12/2011  ? Procedure: LEFT HEART CATHETERIZATION WITH CORONARY ANGIOGRAM;  Surgeon: Hillary Bow, MD;  Location: [facility] CATH LAB;  Service: Cardiovascular;  Laterality:  N/A;  ? VASECTOMY    ? ?Patient Active Problem List  ? Diagnosis Date Noted  ? High grade glioma not classifiable by WHO criteria ([place]) 05/16/2021  ? LLQ abdominal pain 08/13/2020  ? Diverticulitis of colon 08/13/2020  ? History of colonic polyps 08/13/2020  ? Sinus node dysfunction ([place]) 02/11/2014  ? Pulmonary vein stenosis 06/30/2013  ? S/P ablation of atrial fibrillation 02/25/2013  ? 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 Tobacco abuse 09/02/2012  ? Hyperthyroidism 09/02/2012  ? Palpitations 07/09/2012  ? Chronic anticoagulation 02/09/2012  ? Coronary artery disease excluded 02/09/2012  ? History of radiofrequency ablation for complex left atrial arrhythmia 02/09/2012  ? Atrial fibrillation with slow ventricular response ([place]) 02/09/2012  ? Tachycardia induced cardiomyopathy ([place]) 02/09/2012  ? Persistent atrial fibrillation ([place]) 10/26/2011  ? Sleep apnea 10/26/2011  ? Non-ischemic cardiomyopathy ([place]) 10/26/2011  ? Atrial fibrillation ([place]) 08/21/2011  ? Tachycardia induced cardiomyopathy ([place]) 08/21/2011  ? Atrial flutter ([place]) 08/21/2011  ? Obstructive sleep apnea 08/21/2011  ? FH: coronary artery disease 08/21/2011  ? Left atrial thrombus 06/10/2011  ? ? ?ONSET DATE: 03/22ONSET DATE: 05/18/2021  ?  ?REFERRING DIAG: D49.6 (ICD-10-CM) - Brain tumor ([place]) ? ? ?THERAPY DIAG:  ?Hemiplegia and  hemiparesis following unspecified cerebrovascular disease affecting left non-dominant side ([place]) ? ?Visuospatial deficit ? ?Attention and concentration deficit ? ?Frontal lobe and executive function deficit ? ?Unsteadiness on feet ? ?Muscle weakness (generalized) ? ? ?PERTINENT HISTORY: Biparietal craniotomy and resection of tumor on 3/20. PMH including anxiety, a-fib, and L heart cath (2013).   ? ?PRECAUTIONS: fall, goes by "[name]" ? ?SUBJECTIVE: "I got a wedding this weekend"  Patient's daughter getting married.   ?PAIN:  ?Are you having pain? Yes: NPRS scale: 5/10 ?Pain location: left leg ?Pain  description: sore ?Aggravating factors: frequent exercise ?Relieving factors: resting ? ? ?  ? ?OBJECTIVE:  ? ?TODAY'S TREATMENT: ? 06/08/21:   ?Patient reports twisting his ankle at church on Sunday standing under a tree (stepped on root or nut)  Patient still reporting difficulty with walking if he is concentrating on anything else.  Patient tries to use verbal cueing to help him with steps of walking.  Patient reports that he is still experiencing difficulty with concentration and trying to multitask, e.g. paying bills online.   ?Worked on dynamic balance in standing - trying to do two physical tasks simultaneously.  Patient moves fast and quality of movement decompensates.  Toes clawing with stepping leads to pain and decreased balance.  Sent message to oncology regarding medical management for toe clawing.   ? ? ?06/06/21 ? ?Deductive Reasoning/Logic Puzzle (halloween costumes) with min/mod assistance for solving and attending to details. Pt with much improvement with this task compared to last session with organizing the clues.  ? ?Nuts and Bolts with unscrewing with the LUE with cues for recalling to use LUE for unscrewing the bolts with vision occluded. Pt with good coordination and attention to details of the nuts and bolts, a familiar task for him. Pt was able to place them into the correct places with no difficulty and with good time. Pt with external conversation throughout activity. ? ?Blink with one person trying to get rid of cards by matching by shape, color and number with min cues for recalling categories for matching.  ? ? ? ? ? ?  ?GOALS: ?Goals reviewed with patient? Yes ?  ?SHORT TERM GOALS: Target date: 06/17/2021 ?  ?Pt will be independent with HEP targeting coordination and proximal strength in LUE ?Baseline:  ?Goal status: Achieved ?  ?2.  Pt will improve functional gross motor coordination with LUE by increasing Box and Blocks score by 3 blocks or more. ?Baseline: 28 blocks LUE, 52 RUE ?Goal  status: ONGOING improved coordination - continue to assess goal ?  ?3.  Pt will increase dynamic balance and safety with standing task for increasing independence with ADLs to set up and distant supervision. ?Baseline: supervision - min A for safety d/t unsteadiness ?Goal status: ONGOING ?  ?4.  Pt will attend to novel cognitive task for 8 minutes or greater with no cues for redirection. ?Baseline:  ?Goal status: Achieved pt attends to task however with increased external conversations during task. ?  ?HOME EXERCISE PROGRAM: ? ?  ?  ?LONG TERM GOALS: Target date: 07/29/2021 ?  ?Pt will be independent with updated HEP targeting progressions for patient. ?Baseline:  ?Goal status: ONGOING ?  ?2.  Pt will increase functional fine motor coordination in LUE by completing 9 hole peg test in 65 seconds or less. ?Baseline: 74s LUE ?Goal status: ONGOING ?  ?3.  Pt will perform simple warm meal prep and/or housekeeping with distant supervision for safety. ?Baseline:  ?Goal status: ONGOING ?  ?  4.  Pt will perform environmental scanning in moderately distracting environment with 90% accuracy or greater. ?Baseline:          ?Goal status: INITIAL ?  ?5.  Pt will report using simple hand tools with distant supervision (no power tools)  ?Baseline:  ?Goal status: Achieved ?  ?ASSESSMENT: ?  ?CLINICAL IMPRESSION: ?Patient is working very hard outside of therapy to build himself up prior to starting treatment.  Patient is reporting as much concern for mental fatigue as physical fatigue.   ?  ?PERFORMANCE DEFICITS in functional skills including ADLs, IADLs, coordination, dexterity, proprioception, sensation, ROM, strength, FMC, GMC, balance, decreased knowledge of use of DME, vision, and UE functional use, cognitive skills including attention, perception, problem solving, safety awareness, sequencing, temperament/personality, and thought, and psychosocial skills including coping strategies and habits.  ?  ?IMPAIRMENTS are limiting patient  from ADLs, IADLs, work, and leisure.  ?  ?COMORBIDITIES may have co-morbidities  that affects occupational performance. Patient will benefit from skilled OT to address above impairments and improve overall fun

## 2021-06-13 ENCOUNTER — Telehealth: Payer: Self-pay

## 2021-06-13 ENCOUNTER — Telehealth: Payer: Self-pay | Admitting: Pharmacist

## 2021-06-13 ENCOUNTER — Ambulatory Visit: Payer: BC Managed Care – PPO | Admitting: Physical Therapy

## 2021-06-13 ENCOUNTER — Ambulatory Visit: Payer: BC Managed Care – PPO | Admitting: Occupational Therapy

## 2021-06-13 ENCOUNTER — Other Ambulatory Visit (HOSPITAL_COMMUNITY): Payer: Self-pay

## 2021-06-13 ENCOUNTER — Encounter: Payer: Self-pay | Admitting: Occupational Therapy

## 2021-06-13 ENCOUNTER — Other Ambulatory Visit: Payer: Self-pay | Admitting: Internal Medicine

## 2021-06-13 ENCOUNTER — Encounter: Payer: Self-pay | Admitting: Physical Therapy

## 2021-06-13 ENCOUNTER — Other Ambulatory Visit: Payer: Self-pay | Admitting: Neurology

## 2021-06-13 DIAGNOSIS — C719 Malignant neoplasm of brain, unspecified: Secondary | ICD-10-CM

## 2021-06-13 DIAGNOSIS — I69954 Hemiplegia and hemiparesis following unspecified cerebrovascular disease affecting left non-dominant side: Secondary | ICD-10-CM

## 2021-06-13 DIAGNOSIS — R41840 Attention and concentration deficit: Secondary | ICD-10-CM

## 2021-06-13 DIAGNOSIS — R41842 Visuospatial deficit: Secondary | ICD-10-CM

## 2021-06-13 DIAGNOSIS — R260 Ataxic gait: Secondary | ICD-10-CM

## 2021-06-13 DIAGNOSIS — M21372 Foot drop, left foot: Secondary | ICD-10-CM

## 2021-06-13 DIAGNOSIS — M6281 Muscle weakness (generalized): Secondary | ICD-10-CM

## 2021-06-13 DIAGNOSIS — R29818 Other symptoms and signs involving the nervous system: Secondary | ICD-10-CM

## 2021-06-13 DIAGNOSIS — R41844 Frontal lobe and executive function deficit: Secondary | ICD-10-CM

## 2021-06-13 DIAGNOSIS — R2681 Unsteadiness on feet: Secondary | ICD-10-CM

## 2021-06-13 MED ORDER — BACLOFEN 10 MG PO TABS: 10.0000 mg | ORAL_TABLET | Freq: Three times a day (TID) | ORAL | 0 refills | Status: DC

## 2021-06-13 MED ORDER — TEMOZOLOMIDE 20 MG PO CAPS: 20.0000 mg | ORAL_CAPSULE | Freq: Every day | ORAL | 0 refills | Status: DC

## 2021-06-13 MED ORDER — TEMOZOLOMIDE 140 MG PO CAPS
140.0000 mg | ORAL_CAPSULE | Freq: Every day | ORAL | 0 refills | Status: DC
  Filled 2021-06-13: qty 42, 42d supply, fill #0

## 2021-06-13 MED ORDER — ONDANSETRON HCL 8 MG PO TABS
8.0000 mg | ORAL_TABLET | Freq: Two times a day (BID) | ORAL | 1 refills | Status: DC | PRN
  Filled 2021-06-13: qty 30, 15d supply, fill #0

## 2021-06-13 MED ORDER — TEMOZOLOMIDE 20 MG PO CAPS
20.0000 mg | ORAL_CAPSULE | Freq: Every day | ORAL | 0 refills | Status: DC
  Filled 2021-06-13: qty 42, 42d supply, fill #0

## 2021-06-13 MED ORDER — TEMOZOLOMIDE 140 MG PO CAPS: 140.0000 mg | ORAL_CAPSULE | Freq: Every day | ORAL | 0 refills | Status: DC

## 2021-06-13 NOTE — Telephone Encounter (Signed)
Oral Oncology Patient Advocate Encounter ? ?Prior Authorization for [facility] has been approved.   ? ?PA# [id number] ?Effective dates: 06/13/21 through 06/12/22 ? ?Patient must use [facility] ? ?Oral Oncology Clinic will continue to follow.  ? ?Wynn Maudlin CPHT ?Specialty Pharmacy Patient Advocate ?[place] ?Phone [phone] ?Fax [phone] ?06/13/2021 12:12 PM ? ?

## 2021-06-13 NOTE — Therapy (Signed)
?OUTPATIENT PHYSICAL THERAPY TREATMENT NOTE ? ? ?Patient Name: Jerry Richards ?MRN: [id number] ?DOB:[birth year], 51 y.o., male ?Today's Date: 06/14/2021 ? ?PCP: Jerry Bis, MD ?REFERRING PROVIDER: [name], MD ? ? PT End of Session - 06/13/21 1450   ? ? Visit Number 8   ? Number of Visits 17   ? Date for PT Re-Evaluation 07/15/21   ? Authorization Type BCBS [id number]  30% coinsur   ? PT Start Time 7741   ? PT Stop Time 2878   ? PT Time Calculation (min) 45 min   ? Equipment Utilized During Treatment Gait belt   ? Activity Tolerance Patient tolerated treatment well   ? Behavior During Therapy [facility] for tasks assessed/performed;Restless;Impulsive   ? ?  ?  ? ?  ? ? ? ? ?Past Medical History:  ?Diagnosis Date  ? Anxiety   ? Atrial fibrillation ([place])   ? a. s/p RFCA x 2 (10/26/2011, 04/15/2012)  ? Atrial flutter ([place])   ? Chronic kidney disease   ? Clotting disorder ([place])   ? Colitis   ? Current smoker   ? Diverticulitis   ? 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 LA thrombus   ? Non-ischemic cardiomyopathy ([place])   ? OSA on CPAP   ? Sinus trouble   ? Sleep apnea   ? Systolic heart failure ([place])   ? EF 20-25%-> 50-55% in 10/2011. MOST RECENT ECHO (03/2012) EF= 66%  ? ?Past Surgical History:  ?Procedure Laterality Date  ? APPLICATION OF CRANIAL NAVIGATION Right 05/16/2021  ? Procedure: APPLICATION OF CRANIAL NAVIGATION;  Surgeon: Jerry Larsson, MD;  Location: [place];  Service: Neurosurgery;  Laterality: Right;  ? ATRIAL ABLATION SURGERY    ? CARDIAC CATHETERIZATION  08/2011  ? [facility]  ? CHOLECYSTECTOMY    ? CRANIOTOMY Right 05/16/2021  ? Procedure: Craniotomy - right - Parietal with  brainlab;  Surgeon: Jerry Larsson, MD;  Location: [place];  Service: Neurosurgery;  Laterality: Right;  ? FINGER SURGERY    ? LEFT HEART CATHETERIZATION WITH CORONARY ANGIOGRAM N/A 09/12/2011  ? Procedure: LEFT HEART CATHETERIZATION WITH CORONARY ANGIOGRAM;  Surgeon: Jerry Bow, MD;  Location: [facility] CATH LAB;   Service: Cardiovascular;  Laterality: N/A;  ? VASECTOMY    ? ?Patient Active Problem List  ? Diagnosis Date Noted  ? High grade glioma not classifiable by WHO criteria ([place]) 05/16/2021  ? LLQ abdominal pain 08/13/2020  ? Diverticulitis of colon 08/13/2020  ? History of colonic polyps 08/13/2020  ? Sinus node dysfunction ([place]) 02/11/2014  ? Pulmonary vein stenosis 06/30/2013  ? S/P ablation of atrial fibrillation 02/25/2013  ? 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 Tobacco abuse 09/02/2012  ? Hyperthyroidism 09/02/2012  ? Palpitations 07/09/2012  ? Chronic anticoagulation 02/09/2012  ? Coronary artery disease excluded 02/09/2012  ? History of radiofrequency ablation for complex left atrial arrhythmia 02/09/2012  ? Atrial fibrillation with slow ventricular response ([place]) 02/09/2012  ? Tachycardia induced cardiomyopathy ([place]) 02/09/2012  ? Persistent atrial fibrillation ([place]) 10/26/2011  ? Sleep apnea 10/26/2011  ? Non-ischemic cardiomyopathy ([place]) 10/26/2011  ? Atrial fibrillation ([place]) 08/21/2011  ? Tachycardia induced cardiomyopathy ([place]) 08/21/2011  ? Atrial flutter ([place]) 08/21/2011  ? Obstructive sleep apnea 08/21/2011  ? FH: coronary artery disease 08/21/2011  ? Left atrial thrombus 06/10/2011  ? ? ?REFERRING DIAG: D49.6 (ICD-10-CM) - Brain tumor ([place])  ? ?THERAPY DIAG:  ?Muscle weakness (generalized) ? ?Unsteadiness on feet ? ?  Hemiplegia and hemiparesis following unspecified cerebrovascular disease affecting left non-dominant side ([place]) ? ?Ataxic gait ? ?Foot drop, left ? ?PERTINENT HISTORY: Per ED notes:  Pt presented to [facility] ED on 05/08/2021 for progressive L sided weakness and concerns over progressively worsening symptoms including left leg spasms, difficulty typing with his left hand, left face numbness.  He returned 05/09/2021 for follow-up MRI due to CT findings.  Pt underwent right craniotomy 05/16/2021 for biparietal craniotomy and resection of tumor using intraoperative stereotactic  guidance. PMH:  hypothyroidism, cardiomyopathy, CAD, OSA, atrial flutter anticoagulated on Xarelto   ? ?PRECAUTIONS: Fall ? ?SUBJECTIVE: Pt states his L ankle is still bothering him from when he sprained it last week.  "Saturday was a zoo at my house because of the wedding."  He has been overwhelmed by visitors and his brother's temperament.  Pt concerned about L ankle progress and expressing desire to do more with it at home. ? ?PAIN:  ?Are you having pain? No  ? ? ? ?OBJECTIVE:  ?  ?DIAGNOSTIC FINDINGS: Head CT showed a paramedian mass at the right vertex, abutting the falx and MRI showed right parasagittal likely extra-axial tumor.  2.9 x 2.0 x 2.6 cm mass at the medial right frontoparietal vertex.  Post-resection  MRI of brain from 05/17/2021 shows vasogenic edema surrounding resection cavity without midline shift.  No acute infarct.  Concern for resection margins. ?  ?COGNITION: ?Overall cognitive status:  labile, pt is good historian -information verified by wife, easily frustrated due to physical deficits, distractible to light and sound, impulsive ?            ? ?  TODAY'S TREATMENT  ?At onset and end of session PT provides therapeutic listening as pt discusses current social situation around his diagnosis.  He is overwhelmed and frustrated by visitors and family, is hoping this will change since wedding is over.  PT discusses strategies to promote rest and manage feelings of overstimulation as it relates to physical function. ?Reassessed MMT of Left ankle, 1+/5 DF and 2/5 PF, 1/5 for inv/ev ?Had pt in seated perform ankle AROM with tilt followed by baps board in DF/PF to tolerance, pt achieves inc DF using inc hamstring/glut activation to press heel down on board.  PT prevents pt from stabilizing knee for x20sec level hold on baps board. ?PT demos safe PROM on pt with gentle ankle stretch in all directions for pain management and mobility-instructed to perform at home as needed. ?Attempted ankle abc's for  strengthening with pt demonstrating improved engagement in long sitting. ?Pt performs AAROM using rolled up towel into DF x10, cued to engage then pull through ROM maintaining activation. ?Ended session with review of fall recovery on mat, pt supervised into long lay on red mat on floor, cued to roll to knees on R side and rise to half kneel on R side so as to not stress the L knee due to instability.  From half kneel pt cued to use stable chair against wall or surface to rise from.  He is able to rise with supervision.  Cued to slow down as pt rises from floor too quickly losing balance to left x1.  Repeated x2 w/ pt demonstrating rise using LLE x1 with inc instability of knee and LOB requiring step to recover. ? ? ?PATIENT EDUCATION: ?Education details:  Edu on getting quiet rest even if not sleeping.  Edu on safety with stretching due to altered sensation.   ?Person educated: Patient; Spouse ?Education method:  Explanation; Handout ?Education comprehension: verbalized understanding and needs further education ?  ?  ?HOME EXERCISE PROGRAM: ?Access Code: [email or web address] ?URL: [email or web address] ?Date: 06/06/2021 ?Prepared by: Elease Etienne ? ?Exercises ?- Tandem Walking with Counter Support  - 1 x daily - 4 x weekly - 3 sets - 10 reps ?- Forward and backward walking with eyes open and counter support  - 1 x daily - 4 x weekly - 3 sets - 10 reps ?- Staggered Sit-to-Stand  - 1 x daily - 4 x weekly - 3 sets - 10 reps ?- Seated Hamstring Stretch  - 1 x daily - 4 x weekly - 1 sets - 2 reps - 30-45 seconds hold ?- Seated Alternating Side Stretch with Arm Overhead  - 1 x daily - 4 x weekly - 1 sets - 3 reps - 30 seconds hold ?- Supine Hip Internal and External Rotation  - 1 x daily - 5 x weekly - 2 sets - 10 reps ?- Sidelying Thoracic Rotation with Open Book  - 1 x daily - 4 x weekly - 2 sets - 10 reps ?- Squat with Chair Touch and Resistance Loop  - 1 x daily - 4 x weekly - 2 sets - 10 reps ? With walking  tasks pt to focus on intentional steps with correction of foot placement during task before proceeding to next step ? Added walking program with inc step count to 2,00 steps per day with wife's assistance,

## 2021-06-13 NOTE — Therapy (Signed)
?OUTPATIENT OCCUPATIONAL THERAPY TREATMENT NOTE ? ? ?Patient Name: Jerry Richards ?MRN: [id number] ?DOB:12/26/1970, 51 y.o., male ?Today's Date: 06/13/2021 ? ?PCP: [name], MD ?REFERRING PROVIDER: [name], MD ? ? OT End of Session - 06/13/21 1405   ? ? Visit Number 8   ? Number of Visits 17   ? Date for OT Re-Evaluation 07/29/21   ? Authorization Type BCBS   ? Authorization Time Period VL:MN  No Auth   ? OT Start Time 1402   ? OT Stop Time 6294   ? OT Time Calculation (min) 43 min   ? Activity Tolerance Patient tolerated treatment well   ? Behavior During Therapy [facility] for tasks assessed/performed   ? ?  ?  ? ?  ? ? ? ? ?Past Medical History:  ?Diagnosis Date  ? Anxiety   ? Atrial fibrillation ([place])   ? a. s/p RFCA x 2 (10/26/2011, 04/15/2012)  ? Atrial flutter ([place])   ? Chronic kidney disease   ? Clotting disorder ([place])   ? Colitis   ? Current smoker   ? Diverticulitis   ? 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 LA thrombus   ? Non-ischemic cardiomyopathy ([place])   ? OSA on CPAP   ? Sinus trouble   ? Sleep apnea   ? Systolic heart failure ([place])   ? EF 20-25%-> 50-55% in 10/2011. MOST RECENT ECHO (03/2012) EF= 66%  ? ?Past Surgical History:  ?Procedure Laterality Date  ? APPLICATION OF CRANIAL NAVIGATION Right 05/16/2021  ? Procedure: APPLICATION OF CRANIAL NAVIGATION;  Surgeon: Earnie Larsson, MD;  Location: [place];  Service: Neurosurgery;  Laterality: Right;  ? ATRIAL ABLATION SURGERY    ? CARDIAC CATHETERIZATION  08/2011  ? [facility]  ? CHOLECYSTECTOMY    ? CRANIOTOMY Right 05/16/2021  ? Procedure: Craniotomy - right - Parietal with  brainlab;  Surgeon: Earnie Larsson, MD;  Location: [place];  Service: Neurosurgery;  Laterality: Right;  ? FINGER SURGERY    ? LEFT HEART CATHETERIZATION WITH CORONARY ANGIOGRAM N/A 09/12/2011  ? Procedure: LEFT HEART CATHETERIZATION WITH CORONARY ANGIOGRAM;  Surgeon: Hillary Bow, MD;  Location: [facility] CATH LAB;  Service: Cardiovascular;  Laterality:  N/A;  ? VASECTOMY    ? ?Patient Active Problem List  ? Diagnosis Date Noted  ? High grade glioma not classifiable by WHO criteria ([place]) 05/16/2021  ? LLQ abdominal pain 08/13/2020  ? Diverticulitis of colon 08/13/2020  ? History of colonic polyps 08/13/2020  ? Sinus node dysfunction ([place]) 02/11/2014  ? Pulmonary vein stenosis 06/30/2013  ? S/P ablation of atrial fibrillation 02/25/2013  ? 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 Tobacco abuse 09/02/2012  ? Hyperthyroidism 09/02/2012  ? Palpitations 07/09/2012  ? Chronic anticoagulation 02/09/2012  ? Coronary artery disease excluded 02/09/2012  ? History of radiofrequency ablation for complex left atrial arrhythmia 02/09/2012  ? Atrial fibrillation with slow ventricular response ([place]) 02/09/2012  ? Tachycardia induced cardiomyopathy ([place]) 02/09/2012  ? Persistent atrial fibrillation ([place]) 10/26/2011  ? Sleep apnea 10/26/2011  ? Non-ischemic cardiomyopathy ([place]) 10/26/2011  ? Atrial fibrillation ([place]) 08/21/2011  ? Tachycardia induced cardiomyopathy ([place]) 08/21/2011  ? Atrial flutter ([place]) 08/21/2011  ? Obstructive sleep apnea 08/21/2011  ? FH: coronary artery disease 08/21/2011  ? Left atrial thrombus 06/10/2011  ? ? ?ONSET DATE: 03/22ONSET DATE: 05/18/2021  ?  ?REFERRING DIAG: D49.6 (ICD-10-CM) - Brain tumor ([place]) ? ? ?THERAPY DIAG:  ?Muscle weakness (  generalized) ? ?Frontal lobe and executive function deficit ? ?Unsteadiness on feet ? ?Hemiplegia and hemiparesis following unspecified cerebrovascular disease affecting left non-dominant side ([place]) ? ?Visuospatial deficit ? ?Attention and concentration deficit ? ?Other symptoms and signs involving the nervous system ? ? ?PERTINENT HISTORY: Biparietal craniotomy and resection of tumor on 3/20. PMH including anxiety, a-fib, and L heart cath (2013).   ? ?PRECAUTIONS: fall, goes by "[name]" ? ?SUBJECTIVE: "I am tired - we got her married though" ?PAIN:  ?Are you having pain? Yes: NPRS scale: 5/10 ?Pain  location: left leg ?Pain description: sore ?Aggravating factors: frequent exercise ?Relieving factors: resting ? ? ?  ? ?OBJECTIVE:  ? ?TODAY'S TREATMENT: ? ?06/13/21 ?PVC Pipe Tree with mod cues for Fig 13. Pt req'd mod cues for identifying errors and correcting. ? ?Environmental Scanning with 13/15 accuracy = 87%% on first pass. Pt required mod cues for locating remaining items on second pass. Pt completed environmental scanning a second time with phyiscal task of tossing ball with 14/15 accuracy 93% on first pass - req'd min cues for locating on second pass. Pt did forget to continue to toss ball at 2 points during the lap. ? ? ? ? ?  ? ?06/08/21:   ?Patient reports twisting his ankle at church on Sunday standing under a tree (stepped on root or nut)  Patient still reporting difficulty with walking if he is concentrating on anything else.  Patient tries to use verbal cueing to help him with steps of walking.  Patient reports that he is still experiencing difficulty with concentration and trying to multitask, e.g. paying bills online.   ?Worked on dynamic balance in standing - trying to do two physical tasks simultaneously.  Patient moves fast and quality of movement decompensates.  Toes clawing with stepping leads to pain and decreased balance.  Sent message to oncology regarding medical management for toe clawing.   ? ? ? ?  ?GOALS: ?Goals reviewed with patient? Yes ?  ?SHORT TERM GOALS: Target date: 06/17/2021 ?  ?Pt will be independent with HEP targeting coordination and proximal strength in LUE ?Baseline:  ?Goal status: Achieved ?  ?2.  Pt will improve functional gross motor coordination with LUE by increasing Box and Blocks score by 3 blocks or more. ?Baseline: 28 blocks LUE, 52 RUE ?Goal status: ACHIEVED LUE 60 BLOCKS ?  ?3.  Pt will increase dynamic balance and safety with standing task for increasing independence with ADLs to set up and distant supervision. ?Baseline: supervision - min A for safety d/t  unsteadiness ?Goal status: ACHIEVED ?  ?4.  Pt will attend to novel cognitive task for 8 minutes or greater with no cues for redirection. ?Baseline:  ?Goal status: ACHIEVED pt attends to task however with increased external conversations during task. ?  ? ?  ?  ?LONG TERM GOALS: Target date: 07/29/2021 ?  ?Pt will be independent with updated HEP targeting progressions for patient. ?Baseline:  ?Goal status: ONGOING ?  ?2.  Pt will increase functional fine motor coordination in LUE by completing 9 hole peg test in 65 seconds or less. ?Baseline: 74s LUE ?Goal status: ACHIEVED 24.10s ?  ?3.  Pt will perform simple warm meal prep and/or housekeeping with distant supervision for safety. ?Baseline:  ?Goal status: ACHIEVED ?  ?4.  Pt will perform environmental scanning in moderately distracting environment with 90% accuracy or greater. ?Baseline:          ?Goal status: ONGOING ?  ?5.  Pt will report using  simple hand tools with distant supervision (no power tools)  ?Baseline:  ?Goal status: ACHIEVED ?  ?ASSESSMENT: ?  ?CLINICAL IMPRESSION: ?Pt continues to progress towards goals.  ?  ?PERFORMANCE DEFICITS in functional skills including ADLs, IADLs, coordination, dexterity, proprioception, sensation, ROM, strength, FMC, GMC, balance, decreased knowledge of use of DME, vision, and UE functional use, cognitive skills including attention, perception, problem solving, safety awareness, sequencing, temperament/personality, and thought, and psychosocial skills including coping strategies and habits.  ?  ?IMPAIRMENTS are limiting patient from ADLs, IADLs, work, and leisure.  ?  ?COMORBIDITIES may have co-morbidities  that affects occupational performance. Patient will benefit from skilled OT to address above impairments and improve overall function. ?  ?  ?REHAB POTENTIAL: Good ?  ? ?  ?  ?  ?PLAN: ?OT FREQUENCY: 2x/week ?  ?OT DURATION: 10 weeks 16 visits over 10 weeks d/t any scheduling conflicts ?  ?PLANNED INTERVENTIONS: self  care/ADL training, therapeutic exercise, therapeutic activity, neuromuscular re-education, manual therapy, aquatic therapy, ultrasound, patient/family education, cognitive remediation/compensation, visual/perceptual

## 2021-06-13 NOTE — Progress Notes (Signed)
Location/Histology of Brain Tumor:  ?Right frontal glioblastoma  ? ?05/16/2021 ?FINAL MICROSCOPIC DIAGNOSIS:  ?A. BRAIN TUMOR, RIGHT POSTERIOR FRONTAL, RESECTION:  ?-  High-grade glioma ? ?Patient presented with symptoms of: (per Dr. Renda Rolls 06/07/21 consult note):  "presented to neurologic attention in March with several days of progressive left sided weakness and seizures, characterized by twitching of the left leg.  CNS imaging demonstrated a right frontal mass" ? ?Past or anticipated interventions, if any, per neurosurgery:  ?05/16/2021 ?--Dr. Earnie Larsson ?Biparietal craniotomy and resection of tumor utilizing microdissection and intraoperative stereotactic guidance for volumetric resection ? ?Past or anticipated interventions, if any, per medical oncology:  ?Under care of Dr. Cecil Cobbs ?06/07/2021 ?We had an extensive conversation with him and his wife regarding pathology, prognosis, and available treatment pathways in high grade glioma.   ?We are encouraged by his good quality resection, improved functional status, good cognitive function, lack of definitive glioblastoma histology.   ?We ultimately recommended proceeding with course of intensity modulated radiation therapy and concurrent daily Temozolomide.   ?Radiation will be administered Mon-Fri over 6 weeks, Temodar will be dosed at '75mg'$ /m2 to be given daily over 42 days.   ?We reviewed side effects of temodar, including fatigue, nausea/vomiting, constipation, and cytopenias. ?Informed consent was verbally obtained at bedside to proceed with oral chemotherapy. ?Every 2 weeks during radiation, labs will be checked accompanied by a clinical evaluation in the brain tumor clinic. ?Decadron should decrease to '2mg'$  daily x5 days, then discontinue if tolerated. ?Keppra will remain at '1000mg'$  BID for now. ?He will continue with aggressive outpatient PT and OT. ?Consultation with radiation oncology will be arranged. ?We also discussed and patient consented for  additional tumor profiling and sequencing through [facility].   ?The patient is not a candidate for a research protocol at this time due to no suitable study identified.  ? ?Dose of Decadron, if applicable: Per wife he has completed course (was taking '2mg'$  daily) ? ?Recent neurologic symptoms, if any:  ?Seizures: Not since surgery  ?Headaches: Reports a single episode a few nights ago; woke him up from sleep, wife had to get him Tylenol  ?Nausea: Patient denies, and reports a stable appetite; Does report altered sense of taste ("food doesn't taste right currently") ?Dizziness/ataxia: Patient denies ?Difficulty with hand coordination: Reports left arm is 80% in strength and dexterity from what it was prior to surgery ?Focal numbness/weakness: Reports left leg weakness and difficulty with mobility. States if his left foot gets wet or cold, he experiences pain/tingling. Left leg muscles are sore and fatigue easily. Describes having to "wake-up" his leg before ambulating. Ambulates with a cane. Had PT/OT evaluation/session yesterday ?Visual deficits/changes: Patient denies ?Confusion/Memory deficits: Patient denies, but wife reports memory concerns (having to repeat herself or answer the same question multiple) ? ?SAFETY ISSUES: ?Prior radiation? No ?Pacemaker/ICD? No ?Possible current pregnancy? N/A ?Is the patient on methotrexate? No ? ?Additional Complaints / other details: Current every day smoker ? ?

## 2021-06-13 NOTE — Progress Notes (Signed)
?Radiation Oncology         [phone] ?________________________________ ? ?Initial Outpatient Consultation ? ?Name: Jerry Richards MRN: [id number]  ?Date: 06/14/2021  DOB: 1970-12-09 ? ?TK:[id number], Mitzie Na, MD  Earnie Larsson, MD  ? ?REFERRING PHYSICIAN: Earnie Larsson, MD ? ?DIAGNOSIS:  ?  ICD-10-CM   ?1. Glioblastoma multiforme of frontal lobe (HCC)  C71.1   ?  ? ? ? Cancer Staging  ?High grade glioma not classifiable by WHO criteria ([place]) ?Staging form: Brain and Spinal Cord, AJCC Version 9 ?- Clinical stage from 05/16/2021: No stage assigned - Signed by Ventura Sellers, MD on 06/07/2021 ?Histopathologic type: Astrocytoma, NOS ?Stage prefix: Initial diagnosis ? ? ?CHIEF COMPLAINT: Here to discuss management of right frontal glioblastoma ? ?HISTORY OF PRESENT ILLNESS::Jerry Richards is a 51 y.o. male who presented to the ED on 05/08/21 with new onset of left arm weakness and numbness, left facial numbness, and suspected seizures. Per encounter notes, the patient experienced some left leg weakness several months prior, which eventually progressed to the left side of his body. He saw neurology for this is the past who recommended an MRI of the brain, but this was pending approval (he did have MRI's of the thoracic and cervical spine performed on 04/29/21 which showed spinal stenosis at C5-7, small central disc protrusions at T6-9, with borderline stenosis at T6-7).  ? ?CT of the head performed in the ED showed a minimally hyperdense paramedian mass at the right vertex, abutting the falx, measuring 2.3 x 1.6 x 2.4 cm, and associated with mild adjacent vasogenic edema within the high right frontal lobe. Differential considerations were noted to include a para falcine meningioma versus a metastatic intra-axial mass at the right vertex. CT also showed a minimal right to left midline shift. No other intracranial abnormalities were appreciated.  ? ?Dr. Trenton Gammon was consulted while inpatient and recommended further work-up via  outpatient MRI of the brain. The patient was also discharged with decadron BID and Keppra.  ? ?The patient was instructed to return to the ED on 05/09/21 to have his brain MRI performed. MRI re-demonstrated the mass at the medial right frontoparietal vertex, measuring 2.9 x 2.0 x 2.6 cm. The mass was again seen with vasogenic edema at the right frontoparietal brain vertex. Findings were interpreted to most likely represent a meningioma.  ? ?The patient proceeded to undergo right parietal craniotomy and resectioning under the care of Dr. Trenton Gammon on 05/16/21. Pathology (from [facility]) revealed high-grade glioma; not classifiable by WHO criteria.  ? ?Post-op MRI on the brain on 05/17/21 showed a nonspecific curvilinear enhancement along the anteroinferior margin of the resection cavity, possibly reflective of residual tumor. An increase in vasogenic edema was also appreciated surrounding the resection cavity compared to pre-op MRI. No midline shift was appreciated.  ? ?Following surgery, his left leg unfortunately became much weaker. The patient accordingly attended OP rehab for this with some improvement.  ? ?The patient was then referred to Dr. Mickeal Skinner on 06/07/21 for further management. Following a thorough discussion of the risks and benefits, the patient agreed to proceed with oral chemotherapy consisting of daily Temozolomide for 42 days, with concurrent radiation. The patient was also instructed to continue on Keppra 1000 mg BID, and Decardron 2 mg x 5 days. ? ? ?Dose of Decadron, if applicable: Per wife he has completed course (was taking '2mg'$  daily) ? ?Recent neurologic symptoms, if any:  ?Seizures: Not since surgery  ?Headaches: Reports a single episode a few  nights ago; woke him up from sleep, wife had to get him Tylenol  ?Nausea: Patient denies, and reports a stable appetite; Does report altered sense of taste ("food doesn't taste right currently") ?Dizziness/ataxia: Patient denies ?Difficulty with hand  coordination: Reports left arm is 80% in strength and dexterity from what it was prior to surgery ?Focal numbness/weakness: Reports left leg weakness and difficulty with mobility. States if his left foot gets wet or cold, he experiences pain/tingling. Left leg muscles are sore and fatigue easily. Describes having to "wake-up" his leg before ambulating. Ambulates with a cane. Had PT/OT evaluation/session yesterday ?Visual deficits/changes: Patient denies ?Confusion/Memory deficits: Patient denies, but wife reports memory concerns (having to repeat herself or answer the same question multiple) ? ?SAFETY ISSUES: ?Prior radiation? No ?Pacemaker/ICD? No ?Possible current pregnancy? N/A ?Is the patient on methotrexate? No ? ?Additional Complaints / other details: Current every day smoker ?He recently attended the wedding of their daughter which was a huge success and very enjoyable.  The date of the wedding was moved up so that it can take place before he started adjuvant therapy. ? ?PREVIOUS RADIATION THERAPY: No ? ?PAST MEDICAL HISTORY:  has a past medical history of Anxiety, Atrial fibrillation (HCC), Atrial flutter ([place]), Chronic kidney disease, Clotting disorder ([place]), Colitis, Current smoker, Diverticulitis, Finger near amputation, left, Hyperthyroidism, LA thrombus, Non-ischemic cardiomyopathy ([place]), OSA on CPAP, Sinus trouble, Sleep apnea, and Systolic heart failure ([place]).   ? ?PAST SURGICAL HISTORY: ?Past Surgical History:  ?Procedure Laterality Date  ? APPLICATION OF CRANIAL NAVIGATION Right 05/16/2021  ? Procedure: APPLICATION OF CRANIAL NAVIGATION;  Surgeon: Earnie Larsson, MD;  Location: [place];  Service: Neurosurgery;  Laterality: Right;  ? ATRIAL ABLATION SURGERY    ? CARDIAC CATHETERIZATION  08/2011  ? [facility]  ? CHOLECYSTECTOMY    ? CRANIOTOMY Right 05/16/2021  ? Procedure: Craniotomy - right - Parietal with  brainlab;  Surgeon: Earnie Larsson, MD;  Location: [place];  Service: Neurosurgery;  Laterality: Right;  ?  FINGER SURGERY    ? LEFT HEART CATHETERIZATION WITH CORONARY ANGIOGRAM N/A 09/12/2011  ? Procedure: LEFT HEART CATHETERIZATION WITH CORONARY ANGIOGRAM;  Surgeon: Hillary Bow, MD;  Location: [facility] CATH LAB;  Service: Cardiovascular;  Laterality: N/A;  ? VASECTOMY    ? ? ?FAMILY HISTORY: family history includes Atrial fibrillation in his father, paternal aunt, and paternal uncle; Cancer in his father and mother; Coronary artery disease in his paternal grandfather and paternal grandmother; Diabetes in his maternal grandmother; Healthy in his mother; Heart attack in his paternal grandfather and paternal grandmother; Heart disease in his paternal grandfather and paternal grandmother. ? ?SOCIAL HISTORY:  reports that he has been smoking cigarettes. He has a 20.00 pack-year smoking history. He has never used smokeless tobacco. He reports that he does not currently use alcohol. He reports that he does not use drugs. ? ?ALLERGIES: Amiodarone and Morphine and related ? ?MEDICATIONS:  ?Current Outpatient Medications  ?Medication Sig Dispense Refill  ? acetaminophen (TYLENOL) 500 MG tablet Take 1,000 mg by mouth every 6 (six) hours as needed for moderate pain.    ? baclofen (LIORESAL) 10 MG tablet Take 1 tablet (10 mg total) by mouth 3 (three) times daily. 90 tablet 0  ? HYDROcodone-acetaminophen (NORCO/VICODIN) 5-325 MG tablet Take 1 tablet by mouth every 4 (four) hours as needed for moderate pain. 30 tablet 0  ? levETIRAcetam (KEPPRA) 500 MG tablet Take 2 tablets (1,000 mg total) by mouth 2 (two) times daily. 120 tablet 2  ?  losartan (COZAAR) 50 MG tablet Take 50 mg by mouth daily.  6  ? mirtazapine (REMERON) 15 MG tablet Take 15 mg by mouth at bedtime.    ? ondansetron (ZOFRAN) 8 MG tablet Take 1 tablet (8 mg total) by mouth 2 (two) times daily as needed (nausea and vomiting). May take 30-60 minutes prior to Temodar administration if nausea/vomiting occurs. 30 tablet 1  ? temozolomide (TEMODAR) 140 MG capsule Take 1 capsule  (140 mg total) by mouth daily. (Take with 20 mg capsule for total daily dose of 160 mg). May take on an empty stomach to decrease nausea & vomiting. 42 capsule 0  ? temozolomide (TEMODAR) 20 MG capsule Ta

## 2021-06-13 NOTE — Telephone Encounter (Addendum)
Oral Oncology Pharmacist Encounter ? ?Received new prescription for Temodar (temozolomide) for the treatment of high grade glioma in conjunction with radiation, planned duration 42 days. ? ?BMP and CBC from 05/17/21 assessed, WBC elevated at 19.9 K/uL, likely secondary to steroids. No baseline dose adjustments required. Prescription dose and frequency assessed for appropriateness. ? ?Current medication list in Epic reviewed, no relevant/significant DDIs with Temodar identified. ? ?Evaluated chart and no patient barriers to medication adherence noted.  ? ?Patient agreement for treatment documented in MD note on 06/07/21. ? ?Patient's insurance requires temozolomide to be filled through [facility] [employer]. Prescription redirected for dispensing.  ? ?Oral Oncology Clinic will continue to follow for insurance authorization, copayment issues, initial counseling and start date. ? ?Leron Croak, PharmD, BCPS ?Hematology/Oncology Clinical Pharmacist ?[facility] Oral Chemotherapy Navigation Clinics ?[phone] ?06/13/2021 10:51 AM ? ?

## 2021-06-13 NOTE — Progress Notes (Signed)
START ON PATHWAY REGIMEN - Neuro ? ? ?  One cycle, concurrent with RT: ?    Temozolomide  ? ?**Always confirm dose/schedule in your pharmacy ordering system** ? ?Patient Characteristics: ?Glioma, Glioblastoma, IDH-wildtype, Newly Diagnosed / Treatment Naive, Good Performance Status and/or Younger Patient, MGMT Promoter Unmethylated/Unknown ?Disease Classification: Glioma ?Disease Classification: Glioblastoma, IDH-wildtype ?Disease Status: Newly Diagnosed / Treatment Naive ?Performance Status: Good Performance Status and/or Younger Patient ?MGMT Promoter Methylation Status: Awaiting Test Results ?Intent of Therapy: ?Non-Curative / Palliative Intent, Discussed with Patient ?

## 2021-06-13 NOTE — Telephone Encounter (Signed)
Oral Oncology Patient Advocate Encounter ?  ?Received notification from [facility] that prior authorization for Temodar is required. ?  ?PA submitted on CoverMyMeds ?Key [id number] ?Status is pending ?  ?Oral Oncology Clinic will continue to follow. ? ?Wynn Maudlin CPHT ?Specialty Pharmacy Patient Advocate ?[place] ?Phone [phone] ?Fax [phone] ?06/13/2021 11:02 AM ? ?

## 2021-06-14 ENCOUNTER — Other Ambulatory Visit (HOSPITAL_COMMUNITY): Payer: Self-pay

## 2021-06-14 ENCOUNTER — Ambulatory Visit
Admission: RE | Admit: 2021-06-14 | Discharge: 2021-06-14 | Disposition: A | Discharge status: Home / Self Care | Payer: BC Managed Care – PPO | Source: Ambulatory Visit | Attending: Radiation Oncology | Admitting: Radiation Oncology

## 2021-06-14 ENCOUNTER — Other Ambulatory Visit: Payer: Self-pay

## 2021-06-14 ENCOUNTER — Encounter: Payer: Self-pay | Admitting: Radiation Oncology

## 2021-06-14 VITALS — BP 124/83 | HR 69 | Temp 97.4°F | Resp 18 | Ht 72.0 in | Wt 208.1 lb

## 2021-06-14 DIAGNOSIS — C711 Malignant neoplasm of frontal lobe: Secondary | ICD-10-CM

## 2021-06-14 DIAGNOSIS — I428 Other cardiomyopathies: Secondary | ICD-10-CM | POA: Diagnosis not present

## 2021-06-14 DIAGNOSIS — Z79630 Long term (current) use of alkylating agent: Secondary | ICD-10-CM | POA: Diagnosis not present

## 2021-06-14 DIAGNOSIS — F17210 Nicotine dependence, cigarettes, uncomplicated: Secondary | ICD-10-CM | POA: Diagnosis not present

## 2021-06-14 DIAGNOSIS — Z79899 Other long term (current) drug therapy: Secondary | ICD-10-CM | POA: Insufficient documentation

## 2021-06-14 DIAGNOSIS — Z808 Family history of malignant neoplasm of other organs or systems: Secondary | ICD-10-CM | POA: Insufficient documentation

## 2021-06-14 DIAGNOSIS — M4802 Spinal stenosis, cervical region: Secondary | ICD-10-CM | POA: Diagnosis not present

## 2021-06-14 DIAGNOSIS — I4891 Unspecified atrial fibrillation: Secondary | ICD-10-CM | POA: Diagnosis not present

## 2021-06-14 DIAGNOSIS — D689 Coagulation defect, unspecified: Secondary | ICD-10-CM | POA: Insufficient documentation

## 2021-06-15 ENCOUNTER — Telehealth: Payer: Self-pay | Admitting: Internal Medicine

## 2021-06-15 NOTE — Telephone Encounter (Signed)
Per 4/18 called and spoke to pt about appointments.  Pr confirmed appointments  ?

## 2021-06-16 ENCOUNTER — Encounter: Payer: Self-pay | Admitting: Occupational Therapy

## 2021-06-16 ENCOUNTER — Ambulatory Visit: Payer: BC Managed Care – PPO | Admitting: Occupational Therapy

## 2021-06-16 ENCOUNTER — Ambulatory Visit: Payer: BC Managed Care – PPO | Admitting: Physical Therapy

## 2021-06-16 DIAGNOSIS — R2681 Unsteadiness on feet: Secondary | ICD-10-CM

## 2021-06-16 DIAGNOSIS — R2689 Other abnormalities of gait and mobility: Secondary | ICD-10-CM

## 2021-06-16 DIAGNOSIS — R41840 Attention and concentration deficit: Secondary | ICD-10-CM

## 2021-06-16 DIAGNOSIS — M6281 Muscle weakness (generalized): Secondary | ICD-10-CM

## 2021-06-16 DIAGNOSIS — R29818 Other symptoms and signs involving the nervous system: Secondary | ICD-10-CM

## 2021-06-16 DIAGNOSIS — R41844 Frontal lobe and executive function deficit: Secondary | ICD-10-CM

## 2021-06-16 DIAGNOSIS — R41842 Visuospatial deficit: Secondary | ICD-10-CM

## 2021-06-16 DIAGNOSIS — I69954 Hemiplegia and hemiparesis following unspecified cerebrovascular disease affecting left non-dominant side: Secondary | ICD-10-CM | POA: Diagnosis not present

## 2021-06-16 NOTE — Therapy (Signed)
?OUTPATIENT PHYSICAL THERAPY TREATMENT NOTE ? ? ?Patient Name: Jerry Richards ?MRN: [id number] ?DOB:02-07-1971, 51 y.o., male ?Today's Date: 06/16/2021 ? ?PCP: [name], MD ?REFERRING PROVIDER: [name], MD ? ? PT End of Session - 06/16/21 1234   ? ? Visit Number 9   ? Number of Visits 17   ? Date for PT Re-Evaluation 07/15/21   ? Authorization Type BCBS [id number]  30% coinsur   ? PT Start Time 1232   ? PT Stop Time 1314   ? PT Time Calculation (min) 42 min   ? Equipment Utilized During Treatment --   ? Activity Tolerance Patient tolerated treatment well   ? Behavior During Therapy [facility] for tasks assessed/performed;Restless;Impulsive   ? ?  ?  ? ?  ? ? ? ? ? ?Past Medical History:  ?Diagnosis Date  ? Anxiety   ? Atrial fibrillation ([place])   ? a. s/p RFCA x 2 (10/26/2011, 04/15/2012)  ? Atrial flutter ([place])   ? Chronic kidney disease   ? Clotting disorder ([place])   ? Colitis   ? Current smoker   ? Diverticulitis   ? 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 LA thrombus   ? Non-ischemic cardiomyopathy ([place])   ? OSA on CPAP   ? Sinus trouble   ? Sleep apnea   ? Systolic heart failure ([place])   ? EF 20-25%-> 50-55% in 10/2011. MOST RECENT ECHO (03/2012) EF= 66%  ? ?Past Surgical History:  ?Procedure Laterality Date  ? APPLICATION OF CRANIAL NAVIGATION Right 05/16/2021  ? Procedure: APPLICATION OF CRANIAL NAVIGATION;  Surgeon: Earnie Larsson, MD;  Location: [place];  Service: Neurosurgery;  Laterality: Right;  ? ATRIAL ABLATION SURGERY    ? CARDIAC CATHETERIZATION  08/2011  ? [facility]  ? CHOLECYSTECTOMY    ? CRANIOTOMY Right 05/16/2021  ? Procedure: Craniotomy - right - Parietal with  brainlab;  Surgeon: Earnie Larsson, MD;  Location: [place];  Service: Neurosurgery;  Laterality: Right;  ? FINGER SURGERY    ? LEFT HEART CATHETERIZATION WITH CORONARY ANGIOGRAM N/A 09/12/2011  ? Procedure: LEFT HEART CATHETERIZATION WITH CORONARY ANGIOGRAM;  Surgeon: Hillary Bow, MD;  Location: [facility] CATH LAB;   Service: Cardiovascular;  Laterality: N/A;  ? VASECTOMY    ? ?Patient Active Problem List  ? Diagnosis Date Noted  ? High grade glioma not classifiable by WHO criteria ([place]) 05/16/2021  ? LLQ abdominal pain 08/13/2020  ? Diverticulitis of colon 08/13/2020  ? History of colonic polyps 08/13/2020  ? Sinus node dysfunction ([place]) 02/11/2014  ? Pulmonary vein stenosis 06/30/2013  ? S/P ablation of atrial fibrillation 02/25/2013  ? 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 Tobacco abuse 09/02/2012  ? Hyperthyroidism 09/02/2012  ? Palpitations 07/09/2012  ? Chronic anticoagulation 02/09/2012  ? Coronary artery disease excluded 02/09/2012  ? History of radiofrequency ablation for complex left atrial arrhythmia 02/09/2012  ? Atrial fibrillation with slow ventricular response ([place]) 02/09/2012  ? Tachycardia induced cardiomyopathy ([place]) 02/09/2012  ? Persistent atrial fibrillation ([place]) 10/26/2011  ? Sleep apnea 10/26/2011  ? Non-ischemic cardiomyopathy ([place]) 10/26/2011  ? Atrial fibrillation ([place]) 08/21/2011  ? Tachycardia induced cardiomyopathy ([place]) 08/21/2011  ? Atrial flutter ([place]) 08/21/2011  ? Obstructive sleep apnea 08/21/2011  ? FH: coronary artery disease 08/21/2011  ? Left atrial thrombus 06/10/2011  ? ? ?REFERRING DIAG: D49.6 (ICD-10-CM) - Brain tumor ([place])  ? ?THERAPY DIAG:  ?Unsteadiness on feet ? ?Muscle weakness (generalized) ? ?  Other abnormalities of gait and mobility ? ?PERTINENT HISTORY: Per ED notes:  Pt presented to [facility] ED on 05/08/2021 for progressive L sided weakness and concerns over progressively worsening symptoms including left leg spasms, difficulty typing with his left hand, left face numbness.  He returned 05/09/2021 for follow-up MRI due to CT findings.  Pt underwent right craniotomy 05/16/2021 for biparietal craniotomy and resection of tumor using intraoperative stereotactic guidance. PMH:  hypothyroidism, cardiomyopathy, CAD, OSA, atrial flutter anticoagulated on Xarelto    ? ?PRECAUTIONS: Fall ? ?SUBJECTIVE: Pt reports his L ankle is still very unstable but doing better, has AFO fitting next week. Has not been resting ankle but instead "running like crazy"  ? ?PAIN:  ?Are you having pain? No  ? ? ? ?OBJECTIVE:  ?  ?DIAGNOSTIC FINDINGS: Head CT showed a paramedian mass at the right vertex, abutting the falx and MRI showed right parasagittal likely extra-axial tumor.  2.9 x 2.0 x 2.6 cm mass at the medial right frontoparietal vertex.  Post-resection  MRI of brain from 05/17/2021 shows vasogenic edema surrounding resection cavity without midline shift.  No acute infarct.  Concern for resection margins. ?  ?COGNITION: ?Overall cognitive status:  labile, pt is good historian -information verified by wife, easily frustrated due to physical deficits, distractible to light and sound, impulsive ?            ? ?  TODAY'S TREATMENT  ?NMR ?Spanish squats w/yellow theraband pulling knees anteriorly for increased quad activation and knee extension when coming to stand on LLE. X15 reps w/band around bilat knees and x15 w/band around LLE only. Pt demonstrated increased difficulty w/band around LLE only. Added to HEP for improved eccentric quad control  ? ?On floor mat: ?-Reverse HS curls x8 w/min A to provide tactile cue for posterior lean. Min cues for diaphragmatic breathing throughout.  ?-Plank walk in/outs x3 per side w/mod A for hip stabilization w/pt on forearms. Pt able to perform on rep at a time prior to needing rest break. Noted significant ataxia of LLE throughout, min A to guide LLE to proper position.  ?-Tall to half kneel x8 per side w/BUE support on mat for BLE coordination, L ankle proprioception and strength.  ?-Donkey kicks, 10 per side, for improved glute strength w/min A for pelvic stabilization to reduce rotation onto stance leg. Noted decreased extension of LLE > RLE ? ? ?Staggered stance RDLs w/12# KB for improved dynamic balance and eccentric hamstring control, x10 per side.  Min guard throughout for safety. Min cues to slow down w/RLE forward as pt very unstable w/increase in speed.  ? ? ?PATIENT EDUCATION: ?Education details: Performing exercises at home safely, continued emphasis on slowing down for improved balance  ?Person educated: Patient; Spouse ?Education method: Explanation; Handout ?Education comprehension: verbalized understanding and needs further education ?  ?  ?HOME EXERCISE PROGRAM: ?Access Code: [email or web address] ?URL: [email or web address] ?Date: 06/06/2021 ?Prepared by: Elease Etienne ? ?Exercises ?- Tandem Walking with Counter Support  - 1 x daily - 4 x weekly - 3 sets - 10 reps ?- Forward and backward walking with eyes open and counter support  - 1 x daily - 4 x weekly - 3 sets - 10 reps ?- Staggered Sit-to-Stand  - 1 x daily - 4 x weekly - 3 sets - 10 reps ?- Seated Hamstring Stretch  - 1 x daily - 4 x weekly - 1 sets - 2 reps - 30-45 seconds hold ?- Seated Alternating Side Stretch with Arm Overhead  -  1 x daily - 4 x weekly - 1 sets - 3 reps - 30 seconds hold ?- Supine Hip Internal and External Rotation  - 1 x daily - 5 x weekly - 2 sets - 10 reps ?- Sidelying Thoracic Rotation with Open Book  - 1 x daily - 4 x weekly - 2 sets - 10 reps ?- Squat with Chair Touch and Resistance Loop  - 1 x daily - 4 x weekly - 2 sets - 10 reps ? With walking tasks pt to focus on intentional steps with correction of foot placement during task before proceeding to next step ? Added walking program with inc step count to 2,00 steps per day with wife's assistance, may use cane inside or on deck, RW outside. ?  ?GOALS: ?Goals reviewed with patient? Yes ?  ?SHORT TERM GOALS: Target date:  06/17/2021 ?  ?Pt will be independent with strength and balance HEP with supervision from family. ?Baseline: To be initiated next session. ?Goal status: MET ?  ?2.  Pt will decrease 5xSTS to <19 seconds in order to demonstrate decreased risk for falls and improved functional bilateral LE strength  and power. ?Baseline: 21.4sec ?Goal status: INITIAL ?  ?3.  Pt will trial AFO with PT to request order as appropriate to promote knee and ankle alignment during gait. ?Baseline: To be initiated in following sessi

## 2021-06-16 NOTE — Therapy (Signed)
?OUTPATIENT OCCUPATIONAL THERAPY TREATMENT NOTE ? ? ?Patient Name: Jerry Richards ?MRN: [id number] ?DOB:Nov 11, 1970, 51 y.o., male ?Today's Date: 06/16/2021 ? ?PCP: Jerry Bis, MD ?REFERRING PROVIDER: [name], MD ? ? OT End of Session - 06/16/21 1350   ? ? Visit Number 9   ? Number of Visits 17   ? Date for OT Re-Evaluation 07/29/21   ? Authorization Type BCBS   ? Authorization Time Period VL:[place]  No Auth   ? OT Start Time 1150   ? OT Stop Time 1230   ? OT Time Calculation (min) 40 min   ? Activity Tolerance Patient tolerated treatment well   ? Behavior During Therapy [facility] for tasks assessed/performed   ? ?  ?  ? ?  ? ? ? ? ?Past Medical History:  ?Diagnosis Date  ? Anxiety   ? Atrial fibrillation ([place])   ? a. s/p RFCA x 2 (10/26/2011, 04/15/2012)  ? Atrial flutter ([place])   ? Chronic kidney disease   ? Clotting disorder ([place])   ? Colitis   ? Current smoker   ? Diverticulitis   ? 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 LA thrombus   ? Non-ischemic cardiomyopathy ([place])   ? OSA on CPAP   ? Sinus trouble   ? Sleep apnea   ? Systolic heart failure ([place])   ? EF 20-25%-> 50-55% in 10/2011. MOST RECENT ECHO (03/2012) EF= 66%  ? ?Past Surgical History:  ?Procedure Laterality Date  ? APPLICATION OF CRANIAL NAVIGATION Right 05/16/2021  ? Procedure: APPLICATION OF CRANIAL NAVIGATION;  Surgeon: Earnie Larsson, MD;  Location: [place];  Service: Neurosurgery;  Laterality: Right;  ? ATRIAL ABLATION SURGERY    ? CARDIAC CATHETERIZATION  08/2011  ? [facility]  ? CHOLECYSTECTOMY    ? CRANIOTOMY Right 05/16/2021  ? Procedure: Craniotomy - right - Parietal with  brainlab;  Surgeon: Earnie Larsson, MD;  Location: [place];  Service: Neurosurgery;  Laterality: Right;  ? FINGER SURGERY    ? LEFT HEART CATHETERIZATION WITH CORONARY ANGIOGRAM N/A 09/12/2011  ? Procedure: LEFT HEART CATHETERIZATION WITH CORONARY ANGIOGRAM;  Surgeon: Hillary Bow, MD;  Location: [facility] CATH LAB;  Service: Cardiovascular;  Laterality:  N/A;  ? VASECTOMY    ? ?Patient Active Problem List  ? Diagnosis Date Noted  ? High grade glioma not classifiable by WHO criteria ([place]) 05/16/2021  ? LLQ abdominal pain 08/13/2020  ? Diverticulitis of colon 08/13/2020  ? History of colonic polyps 08/13/2020  ? Sinus node dysfunction ([place]) 02/11/2014  ? Pulmonary vein stenosis 06/30/2013  ? S/P ablation of atrial fibrillation 02/25/2013  ? 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 Tobacco abuse 09/02/2012  ? Hyperthyroidism 09/02/2012  ? Palpitations 07/09/2012  ? Chronic anticoagulation 02/09/2012  ? Coronary artery disease excluded 02/09/2012  ? History of radiofrequency ablation for complex left atrial arrhythmia 02/09/2012  ? Atrial fibrillation with slow ventricular response ([place]) 02/09/2012  ? Tachycardia induced cardiomyopathy ([place]) 02/09/2012  ? Persistent atrial fibrillation ([place]) 10/26/2011  ? Sleep apnea 10/26/2011  ? Non-ischemic cardiomyopathy ([place]) 10/26/2011  ? Atrial fibrillation ([place]) 08/21/2011  ? Tachycardia induced cardiomyopathy ([place]) 08/21/2011  ? Atrial flutter ([place]) 08/21/2011  ? Obstructive sleep apnea 08/21/2011  ? FH: coronary artery disease 08/21/2011  ? Left atrial thrombus 06/10/2011  ? ? ?ONSET DATE: 03/22ONSET DATE: 05/18/2021  ?  ?REFERRING DIAG: D49.6 (ICD-10-CM) - Brain tumor ([place]) ? ? ?THERAPY DIAG:  ?Hemiplegia and  hemiparesis following unspecified cerebrovascular disease affecting left non-dominant side ([place]) ? ?Frontal lobe and executive function deficit ? ?Muscle weakness (generalized) ? ?Unsteadiness on feet ? ?Visuospatial deficit ? ?Attention and concentration deficit ? ?Other symptoms and signs involving the nervous system ? ? ?PERTINENT HISTORY: Biparietal craniotomy and resection of tumor on 3/20. PMH including anxiety, a-fib, and L heart cath (2013).   ? ?PRECAUTIONS: fall, goes by "[name]" ? ?SUBJECTIVE: I still got the buzzy-buzzies,   ?PAIN:  ?Are you having pain? Yes: NPRS scale: 5-6/10 ?Pain location:  left leg ?Pain description: sore ?Aggravating factors: frequent exercise ?Relieving factors: resting ? ? ?  ? ?OBJECTIVE:  ? ?TODAY'S TREATMENT: ?06/16/21: ?Patient reports fatigue after daughter's wedding, but he is still exercising consistently at home.  Improved postural control noted with walking, still experiencing difficulty with left ankle.  Working to increase postural mobility and control in more dynamic conditions - seated on physioball - working to train stabilizing with left side to move right side.  Patient with iniitial difficulty controlling weight shifts in any direction on ball, but when activity slowed really able to isolate left hip and LE to stabilize.  Patient showing improved isolated control of Left limbs.   ? ?06/13/21 ?PVC Pipe Tree with mod cues for Fig 13. Pt req'd mod cues for identifying errors and correcting. ? ?Environmental Scanning with 13/15 accuracy = 87%% on first pass. Pt required mod cues for locating remaining items on second pass. Pt completed environmental scanning a second time with phyiscal task of tossing ball with 14/15 accuracy 93% on first pass - req'd min cues for locating on second pass. Pt did forget to continue to toss ball at 2 points during the lap. ? ? ? ? ?  ? ? ? ? ? ?  ?GOALS: ?Goals reviewed with patient? Yes ?  ?SHORT TERM GOALS: Target date: 06/17/2021 ?  ?Pt will be independent with HEP targeting coordination and proximal strength in LUE ?Baseline:  ?Goal status: Achieved ?  ?2.  Pt will improve functional gross motor coordination with LUE by increasing Box and Blocks score by 3 blocks or more. ?Baseline: 28 blocks LUE, 52 RUE ?Goal status: ACHIEVED LUE 60 BLOCKS ?  ?3.  Pt will increase dynamic balance and safety with standing task for increasing independence with ADLs to set up and distant supervision. ?Baseline: supervision - min A for safety d/t unsteadiness ?Goal status: ACHIEVED ?  ?4.  Pt will attend to novel cognitive task for 8 minutes or greater  with no cues for redirection. ?Baseline:  ?Goal status: ACHIEVED pt attends to task however with increased external conversations during task. ?  ? ?  ?  ?LONG TERM GOALS: Target date: 07/29/2021 ?  ?Pt will be independent with updated HEP targeting progressions for patient. ?Baseline:  ?Goal status: ONGOING ?  ?2.  Pt will increase functional fine motor coordination in LUE by completing 9 hole peg test in 65 seconds or less. ?Baseline: 74s LUE ?Goal status: ACHIEVED 24.10s ?  ?3.  Pt will perform simple warm meal prep and/or housekeeping with distant supervision for safety. ?Baseline:  ?Goal status: ACHIEVED ?  ?4.  Pt will perform environmental scanning in moderately distracting environment with 90% accuracy or greater. ?Baseline:          ?Goal status: ONGOING ?  ?5.  Pt will report using simple hand tools with distant supervision (no power tools)  ?Baseline:  ?Goal status: ACHIEVED ?  ?ASSESSMENT: ?  ?CLINICAL IMPRESSION: ?Pt has  shown significant improvement in concentration and attention - reports improved performance with work related tasks, e.g. online bill paying.   ?  ?PERFORMANCE DEFICITS in functional skills including ADLs, IADLs, coordination, dexterity, proprioception, sensation, ROM, strength, FMC, GMC, balance, decreased knowledge of use of DME, vision, and UE functional use, cognitive skills including attention, perception, problem solving, safety awareness, sequencing, temperament/personality, and thought, and psychosocial skills including coping strategies and habits.  ?  ?IMPAIRMENTS are limiting patient from ADLs, IADLs, work, and leisure.  ?  ?COMORBIDITIES may have co-morbidities  that affects occupational performance. Patient will benefit from skilled OT to address above impairments and improve overall function. ?  ?  ?REHAB POTENTIAL: Good ?  ? ?  ?  ?  ?PLAN: ?OT FREQUENCY: 2x/week ?  ?OT DURATION: 10 weeks 16 visits over 10 weeks d/t any scheduling conflicts ?  ?PLANNED INTERVENTIONS: self  care/ADL training, therapeutic exercise, therapeutic activity, neuromuscular re-education, manual therapy, aquatic therapy, ultrasound, patient/family education, cognitive remediation/compensation, visual/pe

## 2021-06-17 NOTE — Telephone Encounter (Signed)
Oral Chemotherapy Pharmacist Encounter ? ?I spoke with patient's wife, Jerry Richards, for overview of: Temodar for the treatment of high grade glioma in conjunction with radiation, planned duration concomitant phase 42 days of therapy.  ? ?Counseled on administration, dosing, side effects, monitoring, drug-food interactions, safe handling, storage, and disposal. Discussed safe handling of medication for wife with use of gloves as she will be helping Jerry Richards with his medications.  ? ?Patient will take Temodar '140mg'$  capsules and Temodar '20mg'$  capsules, 160 mg total daily dose, by mouth once daily, may take at bedtime and on an empty stomach to decrease nausea and vomiting. ? ?Patient will take Temodar concurrent with radiation for 42 days straight. ? ?Temodar start date: 06/26/21 PM ?Radiation start date: 06/27/21 ?  ?Adverse effects include but are not limited to: nausea, vomiting, anorexia, GI upset, rash, drug fever, and fatigue. ? ?Rare but serious adverse effects of pneumocystis pneumonia and secondary malignancy also discussed. PCP prophylaxis will not be initiated at this time, but may be added based on lymphocyte count in the future. ?  ?Prophylactic Zofran will not be used at initiation of concurrent phase, but will be initiated if nausea develops despite Temodar administration on an empty stomach and at bedtime. If this occurs, patient will take Zofran 8 mg tablet, 1 tablet by mouth 30-60 min prior to Temodar dose to help decrease N/V  ? ?Reviewed importance of keeping a medication schedule and plan for any missed doses. No barriers to medication adherence identified. ? ?Medication reconciliation performed and medication/allergy list updated. ? ?Insurance authorization for Temodar has been obtained. Medication is being filled through [facility] due to insurance requirements.  ? ?All questions answered. ? ?Jerry Richards voiced understanding and appreciation.  ? ?Medication education handout and  medication calendar placed in mail for patient and patient's wife. Patient and patient's wife know to call the office with questions or concerns. Oral Chemotherapy Clinic phone number provided. ? ?Leron Croak, PharmD, BCPS ?Hematology/Oncology Clinical Pharmacist ?[facility] Oral Chemotherapy Navigation Clinics ?[phone] ?06/17/2021 10:48 AM ? ?

## 2021-06-20 ENCOUNTER — Ambulatory Visit: Payer: BC Managed Care – PPO | Admitting: Occupational Therapy

## 2021-06-20 ENCOUNTER — Encounter: Payer: Self-pay | Admitting: Physical Therapy

## 2021-06-20 ENCOUNTER — Ambulatory Visit: Payer: BC Managed Care – PPO | Admitting: Physical Therapy

## 2021-06-20 ENCOUNTER — Telehealth: Payer: Self-pay | Admitting: Physical Therapy

## 2021-06-20 DIAGNOSIS — M21372 Foot drop, left foot: Secondary | ICD-10-CM

## 2021-06-20 DIAGNOSIS — M6281 Muscle weakness (generalized): Secondary | ICD-10-CM

## 2021-06-20 DIAGNOSIS — I69954 Hemiplegia and hemiparesis following unspecified cerebrovascular disease affecting left non-dominant side: Secondary | ICD-10-CM

## 2021-06-20 DIAGNOSIS — R2681 Unsteadiness on feet: Secondary | ICD-10-CM

## 2021-06-20 DIAGNOSIS — R260 Ataxic gait: Secondary | ICD-10-CM

## 2021-06-20 NOTE — Therapy (Signed)
?OUTPATIENT PHYSICAL THERAPY TREATMENT NOTE-ARRIVED NO CHARGE ? ? ?Patient Name: Jerry Richards ?MRN: [id number] ?DOB:Nov 08, 1970, 51 y.o., male ?Today's Date: 06/20/2021 ? ?PCP: [name], MD ?REFERRING PROVIDER: [name], MD ? ? PT End of Session - 06/20/21 1419   ? ? Visit Number 9   ? Number of Visits 17   ? Date for PT Re-Evaluation 07/15/21   ? Authorization Type BCBS 2023  30% coinsur   ? PT Start Time 1316   ? PT Stop Time 1350   ? PT Time Calculation (min) 34 min   ? Equipment Utilized During Treatment Gait belt   ? Activity Tolerance Patient tolerated treatment well   ? Behavior During Therapy [facility] for tasks assessed/performed;Restless;Impulsive   ? ?  ?  ? ?  ? ? ? ? ? ?Past Medical History:  ?Diagnosis Date  ? Anxiety   ? Atrial fibrillation ([place])   ? a. s/p RFCA x 2 (10/26/2011, 04/15/2012)  ? Atrial flutter ([place])   ? Chronic kidney disease   ? Clotting disorder ([place])   ? Colitis   ? Current smoker   ? Diverticulitis   ? 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 LA thrombus   ? Non-ischemic cardiomyopathy ([place])   ? OSA on CPAP   ? Sinus trouble   ? Sleep apnea   ? Systolic heart failure ([place])   ? EF 20-25%-> 50-55% in 10/2011. MOST RECENT ECHO (03/2012) EF= 66%  ? ?Past Surgical History:  ?Procedure Laterality Date  ? APPLICATION OF CRANIAL NAVIGATION Right 05/16/2021  ? Procedure: APPLICATION OF CRANIAL NAVIGATION;  Surgeon: Earnie Larsson, MD;  Location: [place];  Service: Neurosurgery;  Laterality: Right;  ? ATRIAL ABLATION SURGERY    ? CARDIAC CATHETERIZATION  08/2011  ? [facility]  ? CHOLECYSTECTOMY    ? CRANIOTOMY Right 05/16/2021  ? Procedure: Craniotomy - right - Parietal with  brainlab;  Surgeon: Earnie Larsson, MD;  Location: [place];  Service: Neurosurgery;  Laterality: Right;  ? FINGER SURGERY    ? LEFT HEART CATHETERIZATION WITH CORONARY ANGIOGRAM N/A 09/12/2011  ? Procedure: LEFT HEART CATHETERIZATION WITH CORONARY ANGIOGRAM;  Surgeon: Hillary Bow, MD;   Location: [facility] CATH LAB;  Service: Cardiovascular;  Laterality: N/A;  ? VASECTOMY    ? ?Patient Active Problem List  ? Diagnosis Date Noted  ? High grade glioma not classifiable by WHO criteria ([place]) 05/16/2021  ? LLQ abdominal pain 08/13/2020  ? Diverticulitis of colon 08/13/2020  ? History of colonic polyps 08/13/2020  ? Sinus node dysfunction ([place]) 02/11/2014  ? Pulmonary vein stenosis 06/30/2013  ? S/P ablation of atrial fibrillation 02/25/2013  ? 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 Tobacco abuse 09/02/2012  ? Hyperthyroidism 09/02/2012  ? Palpitations 07/09/2012  ? Chronic anticoagulation 02/09/2012  ? Coronary artery disease excluded 02/09/2012  ? History of radiofrequency ablation for complex left atrial arrhythmia 02/09/2012  ? Atrial fibrillation with slow ventricular response ([place]) 02/09/2012  ? Tachycardia induced cardiomyopathy ([place]) 02/09/2012  ? Persistent atrial fibrillation ([place]) 10/26/2011  ? Sleep apnea 10/26/2011  ? Non-ischemic cardiomyopathy ([place]) 10/26/2011  ? Atrial fibrillation ([place]) 08/21/2011  ? Tachycardia induced cardiomyopathy ([place]) 08/21/2011  ? Atrial flutter ([place]) 08/21/2011  ? Obstructive sleep apnea 08/21/2011  ? FH: coronary artery disease 08/21/2011  ? Left atrial thrombus 06/10/2011  ? ? ?REFERRING DIAG: D49.6 (ICD-10-CM) - Brain tumor ([place])  ? ?THERAPY DIAG:  ?Muscle weakness (generalized) ? ?  Unsteadiness on feet ? ?Ataxic gait ? ?Foot drop, left ? ?Hemiplegia and hemiparesis following unspecified cerebrovascular disease affecting left non-dominant side ([place]) ? ?PERTINENT HISTORY: Per ED notes:  Pt [facility] to [facility] ED on 05/08/2021 for progressive L sided weakness and concerns over progressively worsening symptoms including left leg spasms, difficulty typing with his left hand, left face numbness.  He returned 05/09/2021 for follow-up MRI due to CT findings.  Pt underwent right craniotomy 05/16/2021 for biparietal craniotomy and resection of tumor using  intraoperative stereotactic guidance. PMH:  hypothyroidism, cardiomyopathy, CAD, OSA, atrial flutter anticoagulated on Xarelto   ? ?PRECAUTIONS: Fall ? ?SUBJECTIVE: Wife reporting pt has massive bruising on posterior aspect of left thigh with significant discomfort that started last Friday 06/17/2021. ? ?PAIN:  ?Are you having pain? Yes: NPRS scale: 7/10 ?Pain location: left posterior thigh ?Pain description: sore ?Aggravating factors: using the leg ?Relieving factors: not sure   ? ?PT assessed BLE in exam room with wife present.  Measured circumferentially the right thigh was 43.5 cm 2" superior to the lateral condyle and 55cm 2" distal to the greater trochanter.  The left thigh measured 45 cm 2" superior to the lateral condyle and 55.5cm 2" distal to the greater trochanter.  Wife took photos of bruising with edu on using hypoallergenic marker to track progression.  Bruising extends from posterolateral thigh to medial thigh from midway up thigh ending just above popliteal fossa.  Tender to palpation generally.  Difficulty reaching to floor with knees straight (demos R lateral weight shift) and painful to engage hamstrings.  Edema is apparent, but non-pitting.  Edu on contacting PCP for follow-up with PT to initiate contact for appt.  Further edu on PRICE principles and safety with icing using layers due to sensation deficits.  Instructed to reduce use of cycle at home as well as LE stretching and increased resting time to monitor edema fluctuation.   ? ?   ? ?Bary Richard, PT, DPT ?06/20/2021, 4:50 PM ? ?  ? ? ? ?

## 2021-06-20 NOTE — Telephone Encounter (Signed)
Left request for appt with receptionist at [facility] for Dr. Gar Ponto or first available primary to assess Left hamstring due to significant bruising and edema present during visit today.  MD office to call pt w/ available appt.  ? ?Elease Etienne, PT, DPT ? ?

## 2021-06-22 ENCOUNTER — Other Ambulatory Visit: Payer: Self-pay | Admitting: Family Medicine

## 2021-06-22 ENCOUNTER — Other Ambulatory Visit: Payer: Self-pay | Admitting: Neurology

## 2021-06-23 ENCOUNTER — Ambulatory Visit: Payer: BC Managed Care – PPO | Admitting: Physical Therapy

## 2021-06-23 ENCOUNTER — Other Ambulatory Visit (HOSPITAL_COMMUNITY): Payer: Self-pay | Admitting: Family Medicine

## 2021-06-23 ENCOUNTER — Other Ambulatory Visit: Payer: Self-pay | Admitting: Family Medicine

## 2021-06-23 ENCOUNTER — Encounter: Payer: BC Managed Care – PPO | Admitting: Occupational Therapy

## 2021-06-23 DIAGNOSIS — S76312A Strain of muscle, fascia and tendon of the posterior muscle group at thigh level, left thigh, initial encounter: Secondary | ICD-10-CM

## 2021-06-24 DIAGNOSIS — C711 Malignant neoplasm of frontal lobe: Secondary | ICD-10-CM | POA: Diagnosis not present

## 2021-06-27 ENCOUNTER — Ambulatory Visit: Payer: BC Managed Care – PPO | Admitting: Physical Therapy

## 2021-06-27 ENCOUNTER — Ambulatory Visit
Admission: RE | Admit: 2021-06-27 | Discharge: 2021-06-27 | Disposition: A | Discharge status: Home / Self Care | Payer: BC Managed Care – PPO | Source: Ambulatory Visit | Attending: Radiation Oncology | Admitting: Radiation Oncology

## 2021-06-27 ENCOUNTER — Other Ambulatory Visit: Payer: Self-pay | Admitting: *Deleted

## 2021-06-27 ENCOUNTER — Ambulatory Visit: Payer: BC Managed Care – PPO | Admitting: Occupational Therapy

## 2021-06-27 ENCOUNTER — Other Ambulatory Visit: Payer: Self-pay | Admitting: Internal Medicine

## 2021-06-27 ENCOUNTER — Other Ambulatory Visit: Payer: Self-pay

## 2021-06-27 DIAGNOSIS — C719 Malignant neoplasm of brain, unspecified: Secondary | ICD-10-CM | POA: Insufficient documentation

## 2021-06-27 LAB — RAD ONC ARIA SESSION SUMMARY
Course Elapsed Days: 0
Plan Fractions Treated to Date: 1
Plan Prescribed Dose Per Fraction: 2 Gy
Plan Total Fractions Prescribed: 23
Plan Total Prescribed Dose: 46 Gy
Reference Point Dosage Given to Date: 2 Gy
Reference Point Session Dosage Given: 2 Gy
Session Number: 1

## 2021-06-27 MED ORDER — LEVETIRACETAM 500 MG PO TABS: 1000.0000 mg | ORAL_TABLET | Freq: Two times a day (BID) | ORAL | 2 refills | Status: DC

## 2021-06-27 MED ORDER — SONAFINE EX EMUL
1.0000 "application " | Freq: Two times a day (BID) | CUTANEOUS | Status: DC
  Administered 2021-06-27: 1 via TOPICAL

## 2021-06-27 NOTE — Progress Notes (Signed)
Pt here for patient teaching.   ? ?Pt given Radiation and You booklet, skin care instructions, and Sonafine.   ? ?Reviewed areas of pertinence such as fatigue, hair loss in treatment field, nausea and vomiting, headache, and blurry vision .  ? ?Pt able to give teach back of to pat skin, use unscented/gentle soap, and drink plenty of water,apply Sonafine bid, avoid applying anything to skin within 4 hours of treatment, and to use an electric razor if they must shave.  ? ?Pt demonstrated understanding and verbalizes understanding of information given and will contact nursing with any questions or concerns.   ? ?Http://rtanswers.org/treatmentinformation/whattoexpect/index ?  ? ? ? ? ? ? ?

## 2021-06-28 ENCOUNTER — Ambulatory Visit
Admission: RE | Admit: 2021-06-28 | Discharge: 2021-06-28 | Disposition: A | Discharge status: Home / Self Care | Payer: BC Managed Care – PPO | Source: Ambulatory Visit | Attending: Radiation Oncology | Admitting: Radiation Oncology

## 2021-06-28 ENCOUNTER — Other Ambulatory Visit: Payer: Self-pay

## 2021-06-28 DIAGNOSIS — C719 Malignant neoplasm of brain, unspecified: Secondary | ICD-10-CM | POA: Diagnosis not present

## 2021-06-28 LAB — RAD ONC ARIA SESSION SUMMARY
Course Elapsed Days: 1
Plan Fractions Treated to Date: 2
Plan Prescribed Dose Per Fraction: 2 Gy
Plan Total Fractions Prescribed: 23
Plan Total Prescribed Dose: 46 Gy
Reference Point Dosage Given to Date: 4 Gy
Reference Point Session Dosage Given: 2 Gy
Session Number: 2

## 2021-06-29 ENCOUNTER — Other Ambulatory Visit: Payer: Self-pay

## 2021-06-29 ENCOUNTER — Ambulatory Visit
Admission: RE | Admit: 2021-06-29 | Discharge: 2021-06-29 | Disposition: A | Discharge status: Home / Self Care | Payer: BC Managed Care – PPO | Source: Ambulatory Visit | Attending: Radiation Oncology | Admitting: Radiation Oncology

## 2021-06-29 DIAGNOSIS — C719 Malignant neoplasm of brain, unspecified: Secondary | ICD-10-CM | POA: Diagnosis not present

## 2021-06-29 LAB — RAD ONC ARIA SESSION SUMMARY
Course Elapsed Days: 2
Plan Fractions Treated to Date: 3
Plan Prescribed Dose Per Fraction: 2 Gy
Plan Total Fractions Prescribed: 23
Plan Total Prescribed Dose: 46 Gy
Reference Point Dosage Given to Date: 6 Gy
Reference Point Session Dosage Given: 2 Gy
Session Number: 3

## 2021-06-30 ENCOUNTER — Ambulatory Visit
Admission: RE | Admit: 2021-06-30 | Discharge: 2021-06-30 | Disposition: A | Discharge status: Home / Self Care | Payer: BC Managed Care – PPO | Source: Ambulatory Visit | Attending: Radiation Oncology | Admitting: Radiation Oncology

## 2021-06-30 ENCOUNTER — Ambulatory Visit: Payer: BC Managed Care – PPO | Admitting: Physical Therapy

## 2021-06-30 ENCOUNTER — Other Ambulatory Visit: Payer: Self-pay

## 2021-06-30 ENCOUNTER — Ambulatory Visit: Payer: BC Managed Care – PPO | Admitting: Occupational Therapy

## 2021-06-30 DIAGNOSIS — C719 Malignant neoplasm of brain, unspecified: Secondary | ICD-10-CM | POA: Diagnosis not present

## 2021-06-30 LAB — RAD ONC ARIA SESSION SUMMARY
Course Elapsed Days: 3
Plan Fractions Treated to Date: 4
Plan Prescribed Dose Per Fraction: 2 Gy
Plan Total Fractions Prescribed: 23
Plan Total Prescribed Dose: 46 Gy
Reference Point Dosage Given to Date: 8 Gy
Reference Point Session Dosage Given: 2 Gy
Session Number: 4

## 2021-07-01 ENCOUNTER — Ambulatory Visit
Admission: RE | Admit: 2021-07-01 | Discharge: 2021-07-01 | Disposition: A | Discharge status: Home / Self Care | Payer: BC Managed Care – PPO | Source: Ambulatory Visit | Attending: Radiation Oncology | Admitting: Radiation Oncology

## 2021-07-01 ENCOUNTER — Other Ambulatory Visit: Payer: Self-pay

## 2021-07-01 ENCOUNTER — Ambulatory Visit (HOSPITAL_COMMUNITY)
Admission: RE | Admit: 2021-07-01 | Discharge: 2021-07-01 | Disposition: A | Discharge status: Home / Self Care | Payer: BC Managed Care – PPO | Source: Ambulatory Visit | Attending: Family Medicine | Admitting: Family Medicine

## 2021-07-01 DIAGNOSIS — S76312A Strain of muscle, fascia and tendon of the posterior muscle group at thigh level, left thigh, initial encounter: Secondary | ICD-10-CM | POA: Diagnosis not present

## 2021-07-01 LAB — RAD ONC ARIA SESSION SUMMARY
Course Elapsed Days: 4
Plan Fractions Treated to Date: 5
Plan Prescribed Dose Per Fraction: 2 Gy
Plan Total Fractions Prescribed: 23
Plan Total Prescribed Dose: 46 Gy
Reference Point Dosage Given to Date: 10 Gy
Reference Point Session Dosage Given: 2 Gy
Session Number: 5

## 2021-07-01 IMAGING — MR MR FEMUR*L* W/O CM
7 series · 40 of 40 positions shown · non-contrast
Comparison: None Available.

CLINICAL DATA: Left hamstring pain for 2 weeks.  No known injury.

EXAM:
MR OF THE LEFT FEMUR WITHOUT CONTRAST
TECHNIQUE: Multiplanar, multisequence MR imaging of the left femur was
performed. No intravenous contrast was administered.

[Series 4: T1 · coronal · left · 4.0mm · 1.08mm/px · 4 of 39 slices shown (1 of 2)]
[im 1/39]
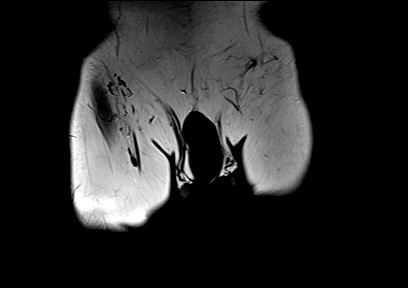
[im 13/39]
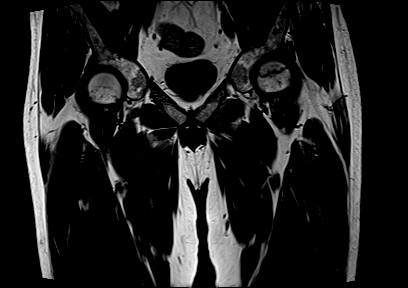
[im 26/39]
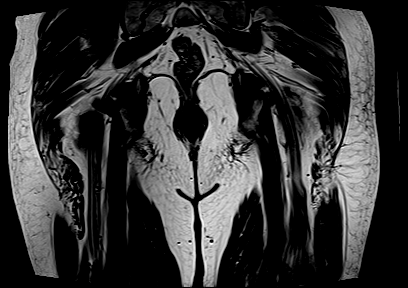
[im 39/39]
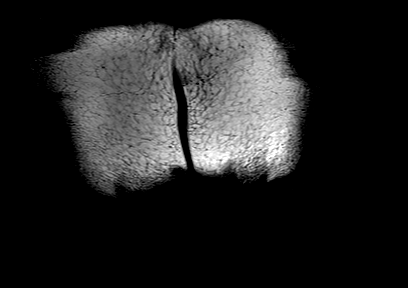

[Series 5: T1 · coronal · left · 4.0mm · 1.08mm/px · 4 of 38 slices shown (2 of 2)]
[im 1/38]
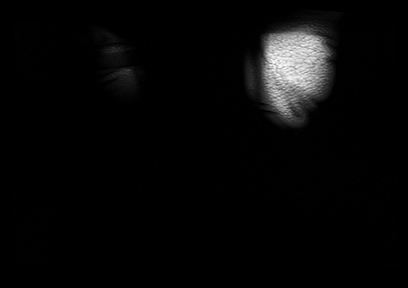
[im 13/38]
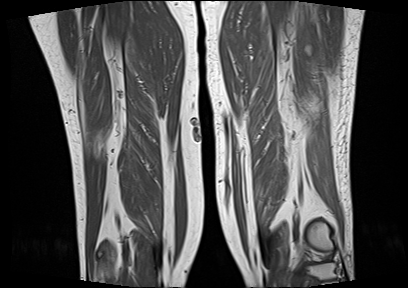
[im 25/38]
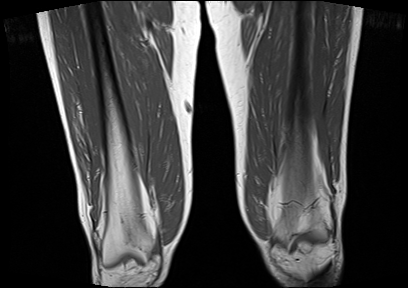
[im 38/38]
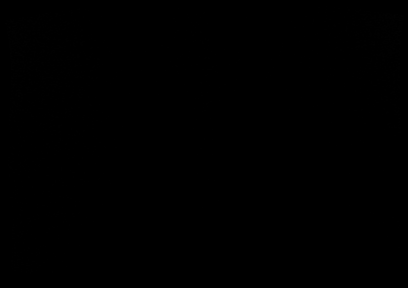

[Series 6: STIR · coronal · left · 4.0mm · 1.08mm/px · 4 of 39 slices shown (1 of 2)]
[im 1/39]
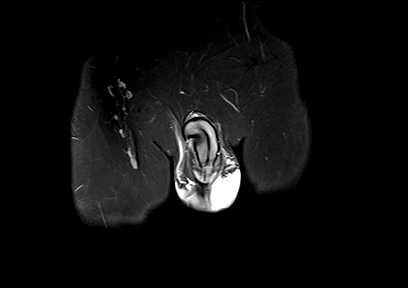
[im 13/39]
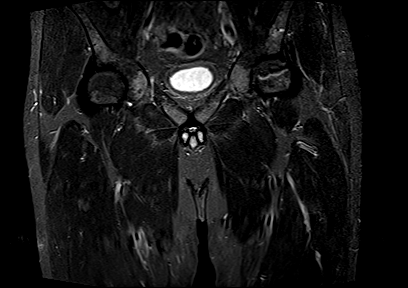
[im 26/39]
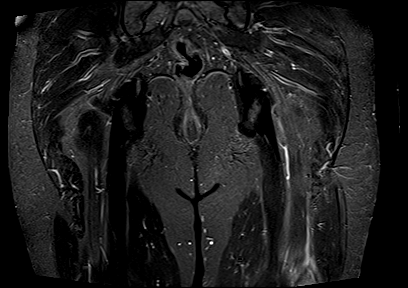
[im 39/39]
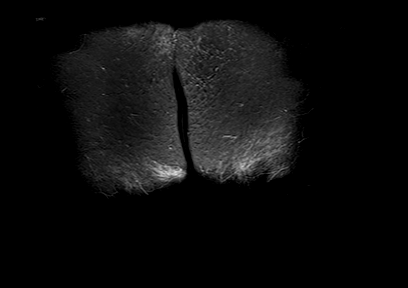

[Series 7: STIR · coronal · left · 4.0mm · 1.08mm/px · 4 of 38 slices shown (2 of 2)]
[im 1/38]
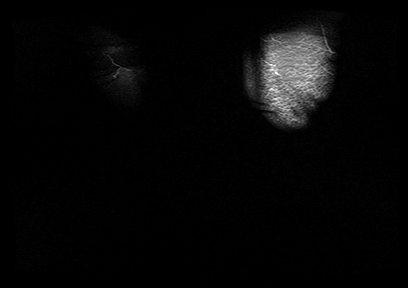
[im 13/38]
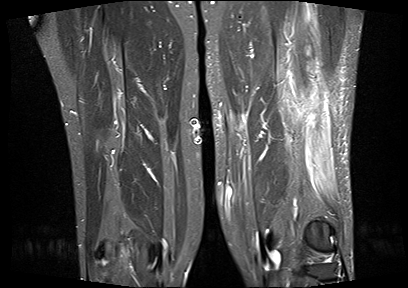
[im 25/38]
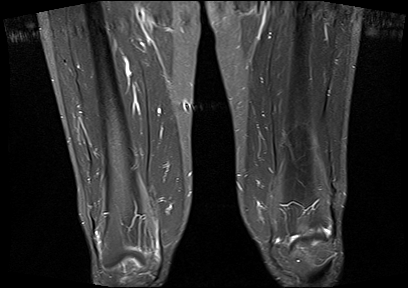
[im 38/38]
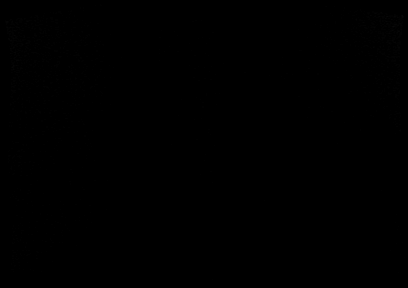

[Series 10: ax t1_comp · axial · left · 5.0mm · 0.86mm/px · z∈[-306,+218]mm · 10 of 96 slices shown]
[im 1/96]
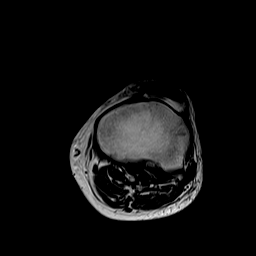
[im 11/96]
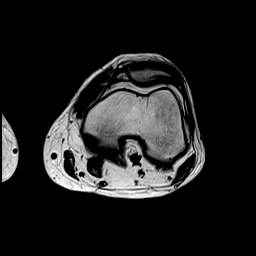
[im 22/96]
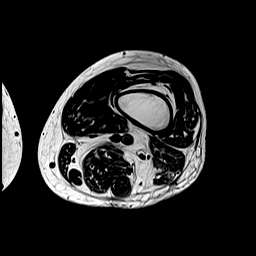
[im 32/96]
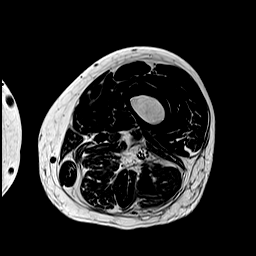
[im 43/96]
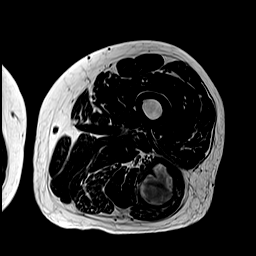
[im 53/96]
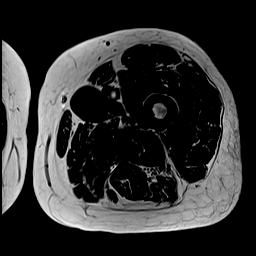
[im 64/96]
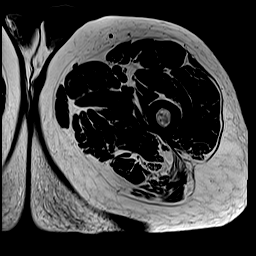
[im 74/96]
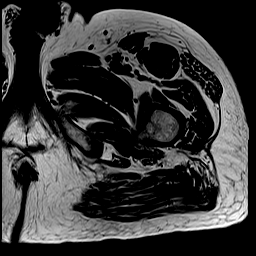
[im 85/96]
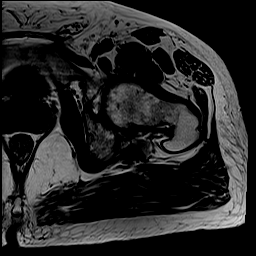
[im 96/96]
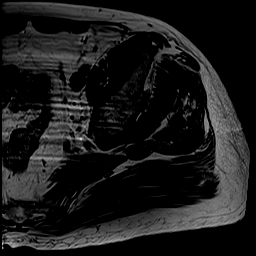

[Series 13: T2 · axial · left · 5.0mm · 0.86mm/px · z∈[-306,+218]mm · 10 of 96 slices shown]
[im 1/96]
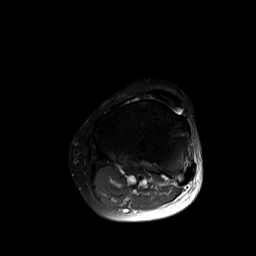
[im 11/96]
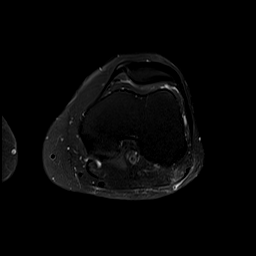
[im 22/96]
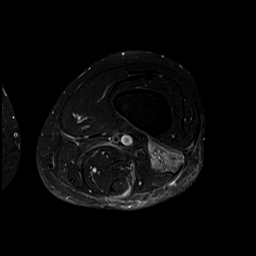
[im 32/96]
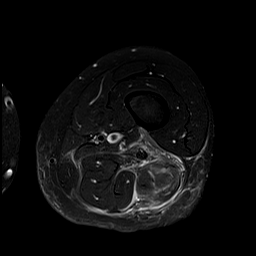
[im 43/96]
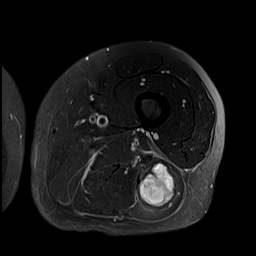
[im 53/96]
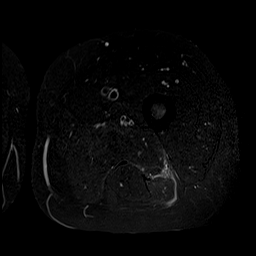
[im 64/96]
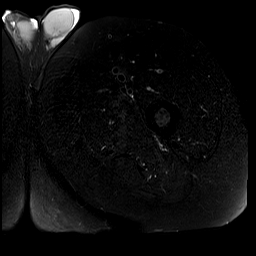
[im 74/96]
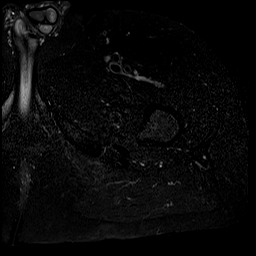
[im 85/96]
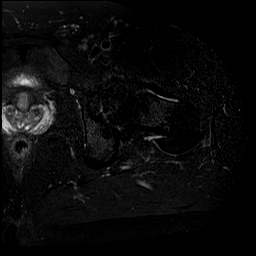
[im 96/96]
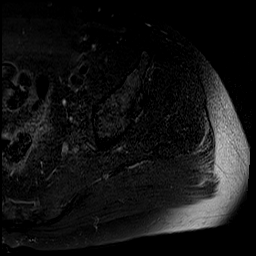

[Series 16: t2_tse_sag fs_comp · sagittal · left · 5.0mm · 0.98mm/px · 4 of 37 slices shown]
[im 1/37]
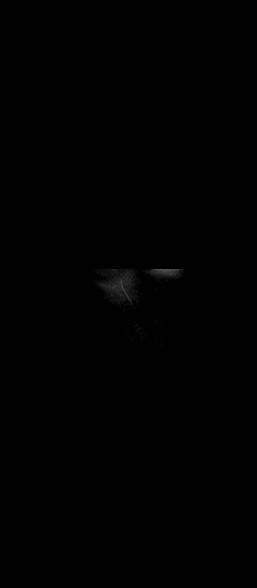
[im 13/37]
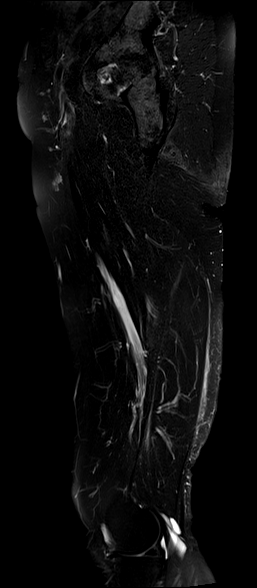
[im 25/37]
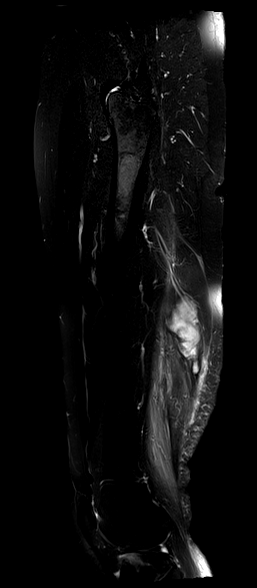
[im 37/37]
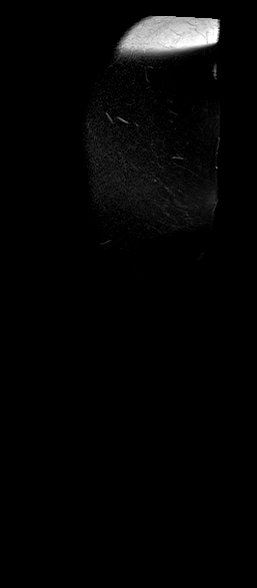

[40 of 40 positions shown; findings below may reference images not displayed]

FINDINGS: Bones/Joint/Cartilage

No fracture or dislocation. Normal alignment. No joint effusion. No
marrow edema. 14 mm benign chondroid lesion in the inferior left
femoral neck most consistent with an enchondroma. 17 mm benign
chondroid lesion in the intertrochanteric region of the right femur
most consistent with a enchondroma.

Avascular necrosis of the superior anterior left femoral head
without significant surrounding bone marrow edema and without
articular surface collapse.

Partial-thickness cartilage loss of the left lateral femorotibial
compartment with subchondral reactive marrow edema in the lateral
tibial plateau. Small left Baker's cyst.

Partial-thickness cartilage loss the femoral head and acetabulum
bilaterally.

Muscles and Tendons

Muscle strain of the distal aspect of the long head of the left
biceps femoris muscle with a partial-thickness muscle tear and
associated 3 x 2.8 x 7.5 cm intramuscular hematoma. Severe muscle
strain of the short head of the biceps femoris muscle. Hamstring
origin is intact.

Remainder of the muscles demonstrate no focal abnormality.
Quadriceps tendon and patellar tendon are intact.

Soft tissue
No fluid collection or hematoma.  No soft tissue mass.
IMPRESSION: 1. Muscle strain of the distal aspect of the long head of the left
biceps femoris muscle with a partial-thickness muscle tear and
associated 3 x 2.8 x 7.5 cm intramuscular hematoma. Severe muscle
strain of the short head of the biceps femoris muscle.
2. Avascular necrosis of the superior anterior left femoral head
without significant surrounding bone marrow edema and without
articular surface collapse.
3. Partial-thickness cartilage loss of the left lateral femorotibial
compartment with subchondral reactive marrow edema in the lateral
tibial plateau.

## 2021-07-04 ENCOUNTER — Ambulatory Visit: Payer: BC Managed Care – PPO | Admitting: Physical Therapy

## 2021-07-04 ENCOUNTER — Ambulatory Visit
Admission: RE | Admit: 2021-07-04 | Discharge: 2021-07-04 | Disposition: A | Discharge status: Home / Self Care | Payer: BC Managed Care – PPO | Source: Ambulatory Visit | Attending: Radiation Oncology | Admitting: Radiation Oncology

## 2021-07-04 ENCOUNTER — Other Ambulatory Visit: Payer: Self-pay

## 2021-07-04 ENCOUNTER — Ambulatory Visit: Payer: BC Managed Care – PPO | Admitting: Occupational Therapy

## 2021-07-04 DIAGNOSIS — C719 Malignant neoplasm of brain, unspecified: Secondary | ICD-10-CM | POA: Diagnosis not present

## 2021-07-04 LAB — RAD ONC ARIA SESSION SUMMARY
Course Elapsed Days: 7
Plan Fractions Treated to Date: 6
Plan Prescribed Dose Per Fraction: 2 Gy
Plan Total Fractions Prescribed: 23
Plan Total Prescribed Dose: 46 Gy
Reference Point Dosage Given to Date: 12 Gy
Reference Point Session Dosage Given: 2 Gy
Session Number: 6

## 2021-07-05 ENCOUNTER — Ambulatory Visit
Admission: RE | Admit: 2021-07-05 | Discharge: 2021-07-05 | Disposition: A | Discharge status: Home / Self Care | Payer: BC Managed Care – PPO | Source: Ambulatory Visit | Attending: Radiation Oncology | Admitting: Radiation Oncology

## 2021-07-05 ENCOUNTER — Other Ambulatory Visit: Payer: Self-pay

## 2021-07-05 DIAGNOSIS — C719 Malignant neoplasm of brain, unspecified: Secondary | ICD-10-CM | POA: Diagnosis not present

## 2021-07-05 LAB — RAD ONC ARIA SESSION SUMMARY
Course Elapsed Days: 8
Plan Fractions Treated to Date: 7
Plan Prescribed Dose Per Fraction: 2 Gy
Plan Total Fractions Prescribed: 23
Plan Total Prescribed Dose: 46 Gy
Reference Point Dosage Given to Date: 14 Gy
Reference Point Session Dosage Given: 2 Gy
Session Number: 7

## 2021-07-06 ENCOUNTER — Other Ambulatory Visit: Payer: Self-pay

## 2021-07-06 ENCOUNTER — Ambulatory Visit
Admission: RE | Admit: 2021-07-06 | Discharge: 2021-07-06 | Disposition: A | Discharge status: Home / Self Care | Payer: BC Managed Care – PPO | Source: Ambulatory Visit | Attending: Radiation Oncology | Admitting: Radiation Oncology

## 2021-07-06 ENCOUNTER — Ambulatory Visit: Payer: BC Managed Care – PPO | Attending: Neurosurgery | Admitting: Occupational Therapy

## 2021-07-06 ENCOUNTER — Ambulatory Visit: Payer: BC Managed Care – PPO | Admitting: Physical Therapy

## 2021-07-06 ENCOUNTER — Telehealth: Payer: Self-pay | Admitting: Physical Therapy

## 2021-07-06 DIAGNOSIS — C719 Malignant neoplasm of brain, unspecified: Secondary | ICD-10-CM | POA: Diagnosis not present

## 2021-07-06 DIAGNOSIS — M6281 Muscle weakness (generalized): Secondary | ICD-10-CM | POA: Insufficient documentation

## 2021-07-06 LAB — RAD ONC ARIA SESSION SUMMARY
Course Elapsed Days: 9
Plan Fractions Treated to Date: 8
Plan Prescribed Dose Per Fraction: 2 Gy
Plan Total Fractions Prescribed: 23
Plan Total Prescribed Dose: 46 Gy
Reference Point Dosage Given to Date: 16 Gy
Reference Point Session Dosage Given: 2 Gy
Session Number: 8

## 2021-07-06 NOTE — Telephone Encounter (Signed)
PT spoke to patient about missed appt today and check-in following noted hamstring issue from prior visit.  Pt states leg is improving.  Pt states he has doctor's note clearing for therapy starting next week that he will bring in for front office to scan.  Will return on next scheduled visit. ? ?Elease Etienne, PT, DPT ? ?

## 2021-07-07 ENCOUNTER — Ambulatory Visit
Admission: RE | Admit: 2021-07-07 | Discharge: 2021-07-07 | Disposition: A | Discharge status: Home / Self Care | Payer: BC Managed Care – PPO | Source: Ambulatory Visit | Attending: Radiation Oncology | Admitting: Radiation Oncology

## 2021-07-07 ENCOUNTER — Inpatient Hospital Stay: Payer: BC Managed Care – PPO

## 2021-07-07 ENCOUNTER — Other Ambulatory Visit: Payer: Self-pay

## 2021-07-07 ENCOUNTER — Inpatient Hospital Stay (HOSPITAL_BASED_OUTPATIENT_CLINIC_OR_DEPARTMENT_OTHER): Payer: BC Managed Care – PPO | Admitting: Internal Medicine

## 2021-07-07 ENCOUNTER — Other Ambulatory Visit (HOSPITAL_COMMUNITY): Payer: Self-pay

## 2021-07-07 VITALS — BP 117/60 | HR 88 | Temp 97.7°F | Resp 17 | Ht 72.0 in | Wt 212.9 lb

## 2021-07-07 DIAGNOSIS — F419 Anxiety disorder, unspecified: Secondary | ICD-10-CM | POA: Insufficient documentation

## 2021-07-07 DIAGNOSIS — R531 Weakness: Secondary | ICD-10-CM | POA: Insufficient documentation

## 2021-07-07 DIAGNOSIS — C719 Malignant neoplasm of brain, unspecified: Secondary | ICD-10-CM | POA: Diagnosis not present

## 2021-07-07 DIAGNOSIS — Z79630 Long term (current) use of alkylating agent: Secondary | ICD-10-CM | POA: Insufficient documentation

## 2021-07-07 DIAGNOSIS — Z7901 Long term (current) use of anticoagulants: Secondary | ICD-10-CM | POA: Insufficient documentation

## 2021-07-07 DIAGNOSIS — Z9049 Acquired absence of other specified parts of digestive tract: Secondary | ICD-10-CM | POA: Insufficient documentation

## 2021-07-07 DIAGNOSIS — R569 Unspecified convulsions: Secondary | ICD-10-CM | POA: Insufficient documentation

## 2021-07-07 DIAGNOSIS — C711 Malignant neoplasm of frontal lobe: Secondary | ICD-10-CM | POA: Insufficient documentation

## 2021-07-07 DIAGNOSIS — F17210 Nicotine dependence, cigarettes, uncomplicated: Secondary | ICD-10-CM | POA: Insufficient documentation

## 2021-07-07 DIAGNOSIS — Z808 Family history of malignant neoplasm of other organs or systems: Secondary | ICD-10-CM | POA: Insufficient documentation

## 2021-07-07 DIAGNOSIS — Z79899 Other long term (current) drug therapy: Secondary | ICD-10-CM | POA: Insufficient documentation

## 2021-07-07 DIAGNOSIS — Z885 Allergy status to narcotic agent status: Secondary | ICD-10-CM | POA: Insufficient documentation

## 2021-07-07 DIAGNOSIS — R253 Fasciculation: Secondary | ICD-10-CM | POA: Insufficient documentation

## 2021-07-07 DIAGNOSIS — Z8249 Family history of ischemic heart disease and other diseases of the circulatory system: Secondary | ICD-10-CM | POA: Insufficient documentation

## 2021-07-07 DIAGNOSIS — Z833 Family history of diabetes mellitus: Secondary | ICD-10-CM | POA: Insufficient documentation

## 2021-07-07 LAB — RAD ONC ARIA SESSION SUMMARY
Course Elapsed Days: 10
Plan Fractions Treated to Date: 9
Plan Prescribed Dose Per Fraction: 2 Gy
Plan Total Fractions Prescribed: 23
Plan Total Prescribed Dose: 46 Gy
Reference Point Dosage Given to Date: 18 Gy
Reference Point Session Dosage Given: 2 Gy
Session Number: 9

## 2021-07-07 LAB — CBC WITH DIFFERENTIAL (CANCER CENTER ONLY)
Abs Immature Granulocytes: 0.05 10*3/uL (ref 0.00–0.07)
Basophils Absolute: 0.1 10*3/uL (ref 0.0–0.1)
Basophils Relative: 1 %
Eosinophils Absolute: 0.3 10*3/uL (ref 0.0–0.5)
Eosinophils Relative: 3 %
HCT: 41.3 % (ref 39.0–52.0)
Hemoglobin: 14 g/dL (ref 13.0–17.0)
Immature Granulocytes: 1 %
Lymphocytes Relative: 13 %
Lymphs Abs: 1.1 10*3/uL (ref 0.7–4.0)
MCH: 29.7 pg (ref 26.0–34.0)
MCHC: 33.9 g/dL (ref 30.0–36.0)
MCV: 87.5 fL (ref 80.0–100.0)
Monocytes Absolute: 0.7 10*3/uL (ref 0.1–1.0)
Monocytes Relative: 8 %
Neutro Abs: 6.4 10*3/uL (ref 1.7–7.7)
Neutrophils Relative %: 74 %
Platelet Count: 268 10*3/uL (ref 150–400)
RBC: 4.72 MIL/uL (ref 4.22–5.81)
RDW: 14.4 % (ref 11.5–15.5)
WBC Count: 8.6 10*3/uL (ref 4.0–10.5)
nRBC: 0 % (ref 0.0–0.2)

## 2021-07-07 LAB — CMP (CANCER CENTER ONLY)
ALT: 45 U/L — ABNORMAL HIGH (ref 0–44)
AST: 21 U/L (ref 15–41)
Albumin: 4.2 g/dL (ref 3.5–5.0)
Alkaline Phosphatase: 51 U/L (ref 38–126)
Anion gap: 4 — ABNORMAL LOW (ref 5–15)
BUN: 17 mg/dL (ref 6–20)
CO2: 29 mmol/L (ref 22–32)
Calcium: 9.3 mg/dL (ref 8.9–10.3)
Chloride: 106 mmol/L (ref 98–111)
Creatinine: 1.32 mg/dL — ABNORMAL HIGH (ref 0.61–1.24)
GFR, Estimated: >60 mL/min (ref >60)
Glucose, Bld: 100 mg/dL — ABNORMAL HIGH (ref 70–99)
Potassium: 4.7 mmol/L (ref 3.5–5.1)
Sodium: 139 mmol/L (ref 135–145)
Total Bilirubin: 0.6 mg/dL (ref 0.3–1.2)
Total Protein: 6.9 g/dL (ref 6.5–8.1)

## 2021-07-07 MED ORDER — XARELTO 20 MG PO TABS
20.0000 mg | ORAL_TABLET | Freq: Every day | ORAL | 1 refills | Status: DC
  Filled 2021-07-07: qty 90, 90d supply, fill #0
  Filled 2021-09-08: qty 90, 90d supply, fill #1

## 2021-07-07 NOTE — Progress Notes (Signed)
? ?[facility] [address]  ?[place], [place] [address] ?[phone] ? ? ?Interval Evaluation ? ?Date of Service: 07/07/21 ?Patient Name: Jerry Richards ?Patient MRN: [id number] ?Patient DOB: 1971-01-19 ?Provider: Ventura Sellers, MD ? ?Identifying Statement:  ?Jerry Richards is a 51 y.o. male with right frontal glioblastoma  ? ?Oncologic History: ?Oncology History  ?High grade glioma not classifiable by WHO criteria ([place])  ?05/16/2021 Surgery  ? Craniotomy, R parafalcine resection with Dr. Annette Stable; path is high grade glioma IDHwt ?  ?06/20/2021 -  Chemotherapy  ? Patient is on Treatment Plan : BRAIN GLIOBLASTOMA Radiation Therapy With Concurrent Temozolomide 75 mg/m2 Daily Followed By Sequential Maintenance Temozolomide x 6-12 cycles  ? ?   ? ? ?Biomarkers: ? ?MGMT Unknown.  ?IDH 1/2 Wild type.  ?EGFR Unknown  ?TERT Unknown  ? ?Interval History: Jerry Richards presents today for follow up, now having completed 2 weeks of IMRT and Temodar.  He is tolerating treatment well, no new complaints.  Denies any recurrence of seizures, no headaches.  He did experience painful bruising in his left leg following a PT session 2 weeks ago, that is improved considerably now. ? ?H+P (06/07/21): Patient presented to neurologic attention in March with several days of progressive left sided weakness and seizures, characterized by twitching of the left leg.  CNS imaging demonstrated a right frontal mass.  He underwent craniotomy and resection with Dr. Annette Stable on 05/16/21; path demonstrated high grade glioma IDHwt.  Following surgery his left leg was much weaker, though it has improved with aggressive rehab efforts.  Currently he is using a cane to ambulate.  He has no other complaints, continues on decadron 78m twice per day.  No seizures since surgery. ? ?Medications: ?Current Outpatient Medications on File Prior to Visit  ?Medication Sig Dispense Refill  ? acetaminophen (TYLENOL) 500 MG tablet Take  1,000 mg by mouth every 6 (six) hours as needed for moderate pain.    ? baclofen (LIORESAL) 10 MG tablet Take 1 tablet (10 mg total) by mouth 3 (three) times daily. Please request future refills from PCP. 90 tablet 0  ? HYDROcodone-acetaminophen (NORCO/VICODIN) 5-325 MG tablet Take 1 tablet by mouth every 4 (four) hours as needed for moderate pain. 30 tablet 0  ? levETIRAcetam (KEPPRA) 500 MG tablet Take 2 tablets (1,000 mg total) by mouth 2 (two) times daily. 120 tablet 2  ? losartan (COZAAR) 50 MG tablet Take 50 mg by mouth daily.  6  ? mirtazapine (REMERON) 15 MG tablet Take 15 mg by mouth at bedtime.    ? ondansetron (ZOFRAN) 8 MG tablet Take 1 tablet (8 mg total) by mouth 2 (two) times daily as needed (nausea and vomiting). May take 30-60 minutes prior to Temodar administration if nausea/vomiting occurs. 30 tablet 1  ? temozolomide (TEMODAR) 140 MG capsule Take 1 capsule (140 mg total) by mouth daily. (Take with 20 mg capsule for total daily dose of 160 mg). May take on an empty stomach to decrease nausea & vomiting. 42 capsule 0  ? temozolomide (TEMODAR) 20 MG capsule Take 1 capsule (20 mg total) by mouth daily. (Take with 140 mg capsule for total daily dose of 160 mg). May take on an empty stomach to decrease nausea & vomiting. 42 capsule 0  ? ?No current facility-administered medications on file prior to visit.  ? ? ?Allergies:  ?Allergies  ?Allergen Reactions  ? Amiodarone   ?  AMIODARONE ANALOGUES ?Hyperthyroidism  ?  Morphine And Related Palpitations  ?  Chest pressure/palpitations  ? ?Past Medical History:  ?Past Medical History:  ?Diagnosis Date  ? Anxiety   ? Atrial fibrillation ([place])   ? a. s/p RFCA x 2 (10/26/2011, 04/15/2012)  ? Atrial flutter ([place])   ? Chronic kidney disease   ? Clotting disorder ([place])   ? Colitis   ? Current smoker   ? Diverticulitis   ? 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 LA thrombus   ? Non-ischemic cardiomyopathy ([place])   ? OSA on CPAP    ? Sinus trouble   ? Sleep apnea   ? Systolic heart failure ([place])   ? EF 20-25%-> 50-55% in 10/2011. MOST RECENT ECHO (03/2012) EF= 66%  ? ?Past Surgical History:  ?Past Surgical History:  ?Procedure Laterality Date  ? APPLICATION OF CRANIAL NAVIGATION Right 05/16/2021  ? Procedure: APPLICATION OF CRANIAL NAVIGATION;  Surgeon: Earnie Larsson, MD;  Location: [facility];  Service: Neurosurgery;  Laterality: Right;  ? ATRIAL ABLATION SURGERY    ? CARDIAC CATHETERIZATION  08/2011  ? [facility]  ? CHOLECYSTECTOMY    ? CRANIOTOMY Right 05/16/2021  ? Procedure: Craniotomy - right - Parietal with  brainlab;  Surgeon: Earnie Larsson, MD;  Location: [facility];  Service: Neurosurgery;  Laterality: Right;  ? FINGER SURGERY    ? LEFT HEART CATHETERIZATION WITH CORONARY ANGIOGRAM N/A 09/12/2011  ? Procedure: LEFT HEART CATHETERIZATION WITH CORONARY ANGIOGRAM;  Surgeon: Hillary Bow, MD;  Location[facility] CATH LAB;  Service: Cardiovascular;  Laterality: N/A;  ? VASECTOMY    ? ?Social History:  ?Social History  ? ?Socioeconomic History  ? Marital status: Married  ?  Spouse name: tanya  ? Number of children: 2  ? Years of education: Not on file  ? Highest education level: Some college, no degree  ?Occupational History  ? Occupation: SELF EMPLOYED  ?  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 Smoking status: Every Day  ?  Packs/day: 1.00  ?  Years: 20.00  ?  Pack years: 20.00  ?  Types: Cigarettes  ? Smokeless tobacco: Never  ?Vaping Use  ? Vaping Use: Never used  ?Substance and Sexual Activity  ? Alcohol use: Not Currently  ?  Comment: drinks socially  ? Drug use: No  ? Sexual activity: Yes  ?Other Topics Concern  ? Not on file  ?Social History Narrative  ? Has 2 children  ? 3 years of college  ? Right handed  ? Caffeine: 2.5 cups of coffee AM. Occas tea  ? ?Social Determinants of Health  ? ?Financial Resource Strain: Not on file  ?Food Insecurity: Not on file  ?Transportation Needs: Not on file  ?Physical Activity: Not on file  ?Stress: Not  on file  ?Social Connections: Not on file  ?Intimate Partner Violence: Not on file  ? ?Family History:  ?Family History  ?Problem Relation Age of Onset  ? Healthy Mother   ?     age 11  ? Cancer Mother   ?     melanoma  ? Atrial fibrillation Father   ?     age 80  ? Cancer Father   ?     melanoma  ? Atrial fibrillation Paternal Aunt   ? Atrial fibrillation Paternal Uncle   ? Diabetes Maternal Grandmother   ? Coronary artery disease Paternal Grandmother   ? Heart disease Paternal Grandmother   ?  Heart attack Paternal Grandmother   ? Coronary artery disease Paternal Grandfather   ? Heart disease Paternal Grandfather   ? Heart attack Paternal Grandfather   ? Colon cancer Neg Hx   ? Esophageal cancer Neg Hx   ? Ovarian cancer Neg Hx   ? Stomach cancer Neg Hx   ? Rectal cancer Neg Hx   ? ? ?Review of Systems: ?Constitutional: Doesn't report fevers, chills or abnormal weight loss ?Eyes: Doesn't report blurriness of vision ?Ears, nose, mouth, throat, and face: Doesn't report sore throat ?Respiratory: Doesn't report cough, dyspnea or wheezes ?Cardiovascular: Doesn't report palpitation, chest discomfort  ?Gastrointestinal:  Doesn't report nausea, constipation, diarrhea ?GU: Doesn't report incontinence ?Skin: Doesn't report skin rashes ?Neurological: Per HPI ?Musculoskeletal: Doesn't report joint pain ?Behavioral/Psych: Doesn't report anxiety ? ?Physical Exam: ?Vitals:  ? 07/07/21 0946  ?BP: 117/60  ?Pulse: 88  ?Resp: 17  ?Temp: 97.7 ?F (36.5 ?C)  ?SpO2: 100%  ? ? ?KPS: 80. ?General: Alert, cooperative, pleasant, in no acute distress ?Head: Normal ?EENT: No conjunctival injection or scleral icterus.  ?Lungs: Resp effort normal ?Cardiac: Regular rate ?Abdomen: Non-distended abdomen ?Skin: No rashes cyanosis or petechiae. ?Extremities: No clubbing or edema ? ?Neurologic Exam: ?Mental Status: Awake, alert, attentive to examiner. Oriented to self and environment. Language is fluent with intact comprehension.  ?Cranial Nerves:  Visual acuity is grossly normal. Visual fields are full. Extra-ocular movements intact. No ptosis. Face is symmetric ?Motor: Tone and bulk are normal. Power is 4/5 in left leg, 5/5 otherwise. Reflexes are

## 2021-07-08 ENCOUNTER — Other Ambulatory Visit (HOSPITAL_COMMUNITY): Payer: Self-pay

## 2021-07-08 ENCOUNTER — Ambulatory Visit
Admission: RE | Admit: 2021-07-08 | Discharge: 2021-07-08 | Disposition: A | Discharge status: Home / Self Care | Payer: BC Managed Care – PPO | Source: Ambulatory Visit | Attending: Radiation Oncology | Admitting: Radiation Oncology

## 2021-07-08 ENCOUNTER — Other Ambulatory Visit: Payer: Self-pay

## 2021-07-08 DIAGNOSIS — C719 Malignant neoplasm of brain, unspecified: Secondary | ICD-10-CM | POA: Diagnosis not present

## 2021-07-08 LAB — RAD ONC ARIA SESSION SUMMARY
Course Elapsed Days: 11
Plan Fractions Treated to Date: 10
Plan Prescribed Dose Per Fraction: 2 Gy
Plan Total Fractions Prescribed: 23
Plan Total Prescribed Dose: 46 Gy
Reference Point Dosage Given to Date: 20 Gy
Reference Point Session Dosage Given: 2 Gy
Session Number: 10

## 2021-07-10 ENCOUNTER — Other Ambulatory Visit: Payer: Self-pay | Admitting: Internal Medicine

## 2021-07-10 DIAGNOSIS — C719 Malignant neoplasm of brain, unspecified: Secondary | ICD-10-CM

## 2021-07-11 ENCOUNTER — Ambulatory Visit
Admission: RE | Admit: 2021-07-11 | Discharge: 2021-07-11 | Disposition: A | Discharge status: Home / Self Care | Payer: BC Managed Care – PPO | Source: Ambulatory Visit | Attending: Radiation Oncology | Admitting: Radiation Oncology

## 2021-07-11 ENCOUNTER — Other Ambulatory Visit: Payer: Self-pay

## 2021-07-11 ENCOUNTER — Other Ambulatory Visit (HOSPITAL_COMMUNITY): Payer: Self-pay

## 2021-07-11 DIAGNOSIS — C719 Malignant neoplasm of brain, unspecified: Secondary | ICD-10-CM | POA: Diagnosis not present

## 2021-07-11 LAB — RAD ONC ARIA SESSION SUMMARY
Course Elapsed Days: 14
Plan Fractions Treated to Date: 11
Plan Prescribed Dose Per Fraction: 2 Gy
Plan Total Fractions Prescribed: 23
Plan Total Prescribed Dose: 46 Gy
Reference Point Dosage Given to Date: 22 Gy
Reference Point Session Dosage Given: 2 Gy
Session Number: 11

## 2021-07-11 NOTE — Telephone Encounter (Signed)
Refill request for Temodar refused.  Patient will have a new Rx after chemo/radiation completed at a higher dose.  Will see provider before refilling. ?

## 2021-07-12 ENCOUNTER — Ambulatory Visit
Admission: RE | Admit: 2021-07-12 | Discharge: 2021-07-12 | Disposition: A | Discharge status: Home / Self Care | Payer: BC Managed Care – PPO | Source: Ambulatory Visit | Attending: Radiation Oncology | Admitting: Radiation Oncology

## 2021-07-12 ENCOUNTER — Encounter: Payer: BC Managed Care – PPO | Admitting: Occupational Therapy

## 2021-07-12 ENCOUNTER — Ambulatory Visit: Payer: BC Managed Care – PPO | Admitting: Physical Therapy

## 2021-07-12 ENCOUNTER — Other Ambulatory Visit: Payer: Self-pay

## 2021-07-12 DIAGNOSIS — M6281 Muscle weakness (generalized): Secondary | ICD-10-CM

## 2021-07-12 DIAGNOSIS — C719 Malignant neoplasm of brain, unspecified: Secondary | ICD-10-CM | POA: Diagnosis not present

## 2021-07-12 LAB — RAD ONC ARIA SESSION SUMMARY
Course Elapsed Days: 15
Plan Fractions Treated to Date: 12
Plan Prescribed Dose Per Fraction: 2 Gy
Plan Total Fractions Prescribed: 23
Plan Total Prescribed Dose: 46 Gy
Reference Point Dosage Given to Date: 24 Gy
Reference Point Session Dosage Given: 2 Gy
Session Number: 12

## 2021-07-12 NOTE — Therapy (Signed)
?OUTPATIENT PHYSICAL THERAPY TREATMENT NOTE- 10TH VISIT PROGRESS NOTE AND DISCHARGE SUMMARY ? ? ?Patient Name: Jerry Richards ?MRN: [id number] ?DOB:25-Jan-1971, 51 y.o., male ?Today's Date: 07/12/2021 ? ?PCP: [name], MD ?REFERRING PROVIDER: Earnie Larsson, MD ? ?PHYSICAL THERAPY DISCHARGE SUMMARY ? ?Visits from Start of Care: 10 ? ?Current functional level related to goals / functional outcomes: ?See below  ?  ?Remaining deficits: ?LLE weakness, impaired safety awareness, L foot drop   ?  ?Education / Equipment: ?HEP, L AFO   ? ?Patient agrees to discharge. Patient goals were  discontinued due to biceps femoris tear and ongoing chemotherapy treatments . Patient is being discharged due to a change in medical status. ? ?Physical Therapy Progress Note ?  ?Dates of Reporting Period:05/20/21 - 07/12/21 ? ?See Note below for Objective Data and Assessment of Progress/Goals. ? ? ? PT End of Session - 07/12/21 1355   ? ? Visit Number 10   ? Number of Visits 17   ? Date for PT Re-Evaluation 07/15/21   ? Authorization Type BCBS [id number]  30% coinsur   ? PT Start Time 2423   Pt arrived late  ? PT Stop Time 1400   ? PT Time Calculation (min) 37 min   ? Equipment Utilized During Treatment Gait belt   ? Activity Tolerance Patient tolerated treatment well   ? Behavior During Therapy [facility] for tasks assessed/performed;Restless;Impulsive   ? ?  ?  ? ?  ? ? ? ? ? ? ?Past Medical History:  ?Diagnosis Date  ? Anxiety   ? Atrial fibrillation ([place])   ? a. s/p RFCA x 2 (10/26/2011, 04/15/2012)  ? Atrial flutter ([place])   ? Chronic kidney disease   ? Clotting disorder ([place])   ? Colitis   ? Current smoker   ? Diverticulitis   ? 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 LA thrombus   ? Non-ischemic cardiomyopathy ([place])   ? OSA on CPAP   ? Sinus trouble   ? Sleep apnea   ? Systolic heart failure ([place])   ? EF 20-25%-> 50-55% in 10/2011. MOST RECENT ECHO (03/2012) EF= 66%  ? ?Past Surgical History:  ?Procedure  Laterality Date  ? APPLICATION OF CRANIAL NAVIGATION Right 05/16/2021  ? Procedure: APPLICATION OF CRANIAL NAVIGATION;  Surgeon: Earnie Larsson, MD;  Location: [facility];  Service: Neurosurgery;  Laterality: Right;  ? ATRIAL ABLATION SURGERY    ? CARDIAC CATHETERIZATION  08/2011  ? [facility]  ? CHOLECYSTECTOMY    ? CRANIOTOMY Right 05/16/2021  ? Procedure: Craniotomy - right - Parietal with  brainlab;  Surgeon: Earnie Larsson, MD;  Location: [facility];  Service: Neurosurgery;  Laterality: Right;  ? FINGER SURGERY    ? LEFT HEART CATHETERIZATION WITH CORONARY ANGIOGRAM N/A 09/12/2011  ? Procedure: LEFT HEART CATHETERIZATION WITH CORONARY ANGIOGRAM;  Surgeon: Hillary Bow, MD;  Location: [facility] CATH LAB;  Service: Cardiovascular;  Laterality: N/A;  ? VASECTOMY    ? ?Patient Active Problem List  ? Diagnosis Date Noted  ? Focal seizures ([place]) 07/07/2021  ? High grade glioma not classifiable by WHO criteria ([place]) 05/16/2021  ? LLQ abdominal pain 08/13/2020  ? Diverticulitis of colon 08/13/2020  ? History of colonic polyps 08/13/2020  ? Sinus node dysfunction ([place]) 02/11/2014  ? Pulmonary vein stenosis 06/30/2013  ? S/P ablation of atrial fibrillation 02/25/2013  ? 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 Tobacco abuse 09/02/2012  ? Hyperthyroidism 09/02/2012  ?  Palpitations 07/09/2012  ? Chronic anticoagulation 02/09/2012  ? Coronary artery disease excluded 02/09/2012  ? History of radiofrequency ablation for complex left atrial arrhythmia 02/09/2012  ? Atrial fibrillation with slow ventricular response ([place]) 02/09/2012  ? Tachycardia induced cardiomyopathy ([place]) 02/09/2012  ? Persistent atrial fibrillation ([place]) 10/26/2011  ? Sleep apnea 10/26/2011  ? Non-ischemic cardiomyopathy ([place]) 10/26/2011  ? Atrial fibrillation ([place]) 08/21/2011  ? Tachycardia induced cardiomyopathy ([place]) 08/21/2011  ? Atrial flutter ([place]) 08/21/2011  ? Obstructive sleep apnea 08/21/2011  ? FH: coronary artery disease 08/21/2011  ? Left atrial thrombus  06/10/2011  ? ? ?REFERRING DIAG: D49.6 (ICD-10-CM) - Brain tumor ([place])  ? ?THERAPY DIAG:  ?Muscle weakness (generalized) ? ?PERTINENT HISTORY: Per ED notes:  Pt presented to [facility] ED on 05/08/2021 for progressive L sided weakness and concerns over progressively worsening symptoms including left leg spasms, difficulty typing with his left hand, left face numbness.  He returned 05/09/2021 for follow-up MRI due to CT findings.  Pt underwent right craniotomy 05/16/2021 for biparietal craniotomy and resection of tumor using intraoperative stereotactic guidance. PMH:  hypothyroidism, cardiomyopathy, CAD, OSA, atrial flutter anticoagulated on Xarelto   ? ?PRECAUTIONS: Fall ? ?SUBJECTIVE: See self-care section  ? ?PAIN:  ?Are you having pain? Yes: NPRS scale: 10/10 ?Pain location: L femur, L hamstring    ? ? ? ?OBJECTIVE:  ?  ?DIAGNOSTIC FINDINGS: Head CT showed a paramedian mass at the right vertex, abutting the falx and MRI showed right parasagittal likely extra-axial tumor.  2.9 x 2.0 x 2.6 cm mass at the medial right frontoparietal vertex.  Post-resection  MRI of brain from 05/17/2021 shows vasogenic edema surrounding resection cavity without midline shift.  No acute infarct.  Concern for resection margins. ?  ?COGNITION: ?Overall cognitive status:  labile, pt is good historian -information verified by wife, easily frustrated due to physical deficits, distractible to light and sound, impulsive ?            ? ?  TODAY'S TREATMENT  ?Self-care/home management ?Extensive discussion with patient and wife regarding pt's recent L femoral MRI (see below) and POC moving forward.  ?  ?IMPRESSION from MRI performed on 07/02/21: ?1. Muscle strain of the distal aspect of the long head of the left ?biceps femoris muscle with a partial-thickness muscle tear and ?associated 3 x 2.8 x 7.5 cm intramuscular hematoma. Severe muscle ?strain of the short head of the biceps femoris muscle. ?2. Avascular necrosis of the superior anterior left  femoral head ?without significant surrounding bone marrow edema and without ?articular surface collapse. ?3. Partial-thickness cartilage loss of the left lateral femorotibial ?compartment with subchondral reactive marrow edema in the lateral ?tibial plateau. ? ?At this time, recommending DC from PT services to allow pt to heal/rest, as he is also receiving chemotherapy treatments and reports he is "exhausted". PT strongly recommending pt back off of his strength exercises at home and only performing walking/light stationary bike unless it is painful. Educated pt and wife on origin/insertion/function of biceps fem long and short head and importance of rest for proper healing, especially w/chemotherapy, and proper healing timeline for partial-thickness tear. Pt has received his personal AFO for his LLE and reports walking >5 minutes is painful, so encouraged pt to walk in short, painless bouts. Pt very impulsive and declined slowing down exercise initially, but seemingly agreed towards end of session. Pt and wife in agreement to discharge from PT services for now and obtaining new referral in 4-6 weeks to allow L hamstring  to heal and complete chemotherapy. Goals discontinued due to 10/10 pain and hamstring injury.  ? ? ?PATIENT EDUCATION: ?Education details: How to obtain new PT referral once pt ready to return to therapy  ?Person educated: Patient; Spouse ?Education method: Explanation; ?Education comprehension: verbalized understanding  ?  ?  ?HOME EXERCISE PROGRAM: ?Access Code: [email or web address] ?URL: [email or web address] ?Date: 06/06/2021 ?Prepared by: Elease Etienne ? ?Exercises ?- Tandem Walking with Counter Support  - 1 x daily - 4 x weekly - 3 sets - 10 reps ?- Forward and backward walking with eyes open and counter support  - 1 x daily - 4 x weekly - 3 sets - 10 reps ?- Staggered Sit-to-Stand  - 1 x daily - 4 x weekly - 3 sets - 10 reps ?- Seated Hamstring Stretch  - 1 x daily - 4 x weekly - 1 sets  - 2 reps - 30-45 seconds hold ?- Seated Alternating Side Stretch with Arm Overhead  - 1 x daily - 4 x weekly - 1 sets - 3 reps - 30 seconds hold ?- Supine Hip Internal and External Rotation  - 1 x daily - 5 x

## 2021-07-13 ENCOUNTER — Encounter: Payer: Self-pay | Admitting: Occupational Therapy

## 2021-07-13 ENCOUNTER — Other Ambulatory Visit: Payer: Self-pay

## 2021-07-13 ENCOUNTER — Ambulatory Visit
Admission: RE | Admit: 2021-07-13 | Discharge: 2021-07-13 | Disposition: A | Discharge status: Home / Self Care | Payer: BC Managed Care – PPO | Source: Ambulatory Visit | Attending: Radiation Oncology | Admitting: Radiation Oncology

## 2021-07-13 DIAGNOSIS — C719 Malignant neoplasm of brain, unspecified: Secondary | ICD-10-CM | POA: Diagnosis not present

## 2021-07-13 LAB — RAD ONC ARIA SESSION SUMMARY
Course Elapsed Days: 16
Plan Fractions Treated to Date: 13
Plan Prescribed Dose Per Fraction: 2 Gy
Plan Total Fractions Prescribed: 23
Plan Total Prescribed Dose: 46 Gy
Reference Point Dosage Given to Date: 26 Gy
Reference Point Session Dosage Given: 2 Gy
Session Number: 13

## 2021-07-13 NOTE — Therapy (Signed)
?OUTPATIENT OCCUPATIONAL THERAPY NON VISIT DISCHARGE NOTE ? ? ?Patient Name: Jerry Richards ?MRN: [id number] ?DOB:Jun 03, 1970, 51 y.o., male ?Today's Date: 07/13/2021 ? ?PCP: [name], MD ?REFERRING PROVIDER: Cecil Cobbs MD ? ? ? ? ? ? ?Past Medical History:  ?Diagnosis Date  ? Anxiety   ? Atrial fibrillation ([place])   ? a. s/p RFCA x 2 (10/26/2011, 04/15/2012)  ? Atrial flutter ([place])   ? Chronic kidney disease   ? Clotting disorder ([place])   ? Colitis   ? Current smoker   ? Diverticulitis   ? 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 LA thrombus   ? Non-ischemic cardiomyopathy ([place])   ? OSA on CPAP   ? Sinus trouble   ? Sleep apnea   ? Systolic heart failure ([place])   ? EF 20-25%-> 50-55% in 10/2011. MOST RECENT ECHO (03/2012) EF= 66%  ? ?Past Surgical History:  ?Procedure Laterality Date  ? APPLICATION OF CRANIAL NAVIGATION Right 05/16/2021  ? Procedure: APPLICATION OF CRANIAL NAVIGATION;  Surgeon: Earnie Larsson, MD;  Location: [facility];  Service: Neurosurgery;  Laterality: Right;  ? ATRIAL ABLATION SURGERY    ? CARDIAC CATHETERIZATION  08/2011  ? [facility]  ? CHOLECYSTECTOMY    ? CRANIOTOMY Right 05/16/2021  ? Procedure: Craniotomy - right - Parietal with  brainlab;  Surgeon: Earnie Larsson, MD;  Location: [facility];  Service: Neurosurgery;  Laterality: Right;  ? FINGER SURGERY    ? LEFT HEART CATHETERIZATION WITH CORONARY ANGIOGRAM N/A 09/12/2011  ? Procedure: LEFT HEART CATHETERIZATION WITH CORONARY ANGIOGRAM;  Surgeon: Hillary Bow, MD;  Location[facility] CATH LAB;  Service: Cardiovascular;  Laterality: N/A;  ? VASECTOMY    ? ?Patient Active Problem List  ? Diagnosis Date Noted  ? Focal seizures ([place]) 07/07/2021  ? High grade glioma not classifiable by WHO criteria ([place]) 05/16/2021  ? LLQ abdominal pain 08/13/2020  ? Diverticulitis of colon 08/13/2020  ? History of colonic polyps 08/13/2020  ? Sinus node dysfunction ([place]) 02/11/2014  ? Pulmonary vein stenosis 06/30/2013  ? S/P ablation of  atrial fibrillation 02/25/2013  ? 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 Tobacco abuse 09/02/2012  ? Hyperthyroidism 09/02/2012  ? Palpitations 07/09/2012  ? Chronic anticoagulation 02/09/2012  ? Coronary artery disease excluded 02/09/2012  ? History of radiofrequency ablation for complex left atrial arrhythmia 02/09/2012  ? Atrial fibrillation with slow ventricular response ([place]) 02/09/2012  ? Tachycardia induced cardiomyopathy ([place]) 02/09/2012  ? Persistent atrial fibrillation ([place]) 10/26/2011  ? Sleep apnea 10/26/2011  ? Non-ischemic cardiomyopathy ([place]) 10/26/2011  ? Atrial fibrillation ([place]) 08/21/2011  ? Tachycardia induced cardiomyopathy ([place]) 08/21/2011  ? Atrial flutter ([place]) 08/21/2011  ? Obstructive sleep apnea 08/21/2011  ? FH: coronary artery disease 08/21/2011  ? Left atrial thrombus 06/10/2011  ? ? ?ONSET DATE: 03/22ONSET DATE: 05/18/2021  ?  ?REFERRING DIAG: D49.6 (ICD-10-CM) - Brain tumor ([place]) ? ? ? ? ? ? ?  ?GOALS: ?Goals reviewed with patient? Yes ?  ?SHORT TERM GOALS: Target date: 06/17/2021 ?  ?Pt will be independent with HEP targeting coordination and proximal strength in LUE ?Baseline:  ?Goal status: Achieved ?  ?2.  Pt will improve functional gross motor coordination with LUE by increasing Box and Blocks score by 3 blocks or more. ?Baseline: 28 blocks LUE, 52 RUE ?Goal status: ACHIEVED LUE 60 BLOCKS ?  ?3.  Pt will increase dynamic balance and safety with standing task for increasing independence with ADLs  to set up and distant supervision. ?Baseline: supervision - min A for safety d/t unsteadiness ?Goal status: ACHIEVED ?  ?4.  Pt will attend to novel cognitive task for 8 minutes or greater with no cues for redirection. ?Baseline:  ?Goal status: ACHIEVED pt attends to task however with increased external conversations during task. ?  ? ?  ?  ?LONG TERM GOALS: Target date: 07/29/2021 ?  ?Pt will be independent with updated HEP targeting progressions for patient. ?Baseline:  ?Goal  status: ONGOING ?  ?2.  Pt will increase functional fine motor coordination in LUE by completing 9 hole peg test in 65 seconds or less. ?Baseline: 74s LUE ?Goal status: ACHIEVED 24.10s ?  ?3.  Pt will perform simple warm meal prep and/or housekeeping with distant supervision for safety. ?Baseline:  ?Goal status: ACHIEVED ?  ?4.  Pt will perform environmental scanning in moderately distracting environment with 90% accuracy or greater. ?Baseline:          ?Goal status: ONGOING ?  ?5.  Pt will report using simple hand tools with distant supervision (no power tools)  ?Baseline:  ?Goal status: ACHIEVED ?  ?ASSESSMENT: ?  ?CLINICAL IMPRESSION: ?OT DISCHARGE ? ?OCCUPATIONAL THERAPY DISCHARGE SUMMARY ? ?Visits from Start of Care: 9 ? ?Current functional level related to goals / functional outcomes: ?Pt has not been seen since 06/16/21 d/t hamstring tear. Pt agreed to discharge at this time d/t needing to rest hamstring and reported wanting/needing a break from therapy. ?  ?Patient agrees to discharge. Patient goals were  not assessed d/t pt not returning since 06/16/21 . Patient is being discharged due to a change in medical status.. ? ?  ?  ?PERFORMANCE DEFICITS in functional skills including ADLs, IADLs, coordination, dexterity, proprioception, sensation, ROM, strength, FMC, GMC, balance, decreased knowledge of use of DME, vision, and UE functional use, cognitive skills including attention, perception, problem solving, safety awareness, sequencing, temperament/personality, and thought, and psychosocial skills including coping strategies and habits.  ?  ?IMPAIRMENTS are limiting patient from ADLs, IADLs, work, and leisure.  ?  ?COMORBIDITIES may have co-morbidities  that affects occupational performance. Patient will benefit from skilled OT to address above impairments and improve overall function. ?  ?  ?REHAB POTENTIAL: Good ?  ? ?  ?  ?  ?PLAN: ?OT FREQUENCY: 2x/week ?  ?OT DURATION: 10 weeks 16 visits over 10 weeks d/t  any scheduling conflicts ?  ?PLANNED INTERVENTIONS: self care/ADL training, therapeutic exercise, therapeutic activity, neuromuscular re-education, manual therapy, aquatic therapy, ultrasound, patient/family education, cognitive remediation/compensation, visual/perceptual remediation/compensation, coping strategies training, and DME and/or AE instructions ?  ?RECOMMENDED OTHER SERVICES: possible benefit from ST evaluation ?  ?CONSULTED AND AGREED WITH PLAN OF CARE: Patient  ?  ?PLAN FOR NEXT SESSION: OT discharge - pt verbalized understanding of needing new referral if returning for therapy ? ?Zachery Conch, OT ?07/13/2021, 4:40 PM ? ?  ? ?  ?

## 2021-07-14 ENCOUNTER — Ambulatory Visit
Admission: RE | Admit: 2021-07-14 | Discharge: 2021-07-14 | Disposition: A | Discharge status: Home / Self Care | Payer: BC Managed Care – PPO | Source: Ambulatory Visit | Attending: Radiation Oncology | Admitting: Radiation Oncology

## 2021-07-14 ENCOUNTER — Ambulatory Visit: Payer: BC Managed Care – PPO | Admitting: Physical Therapy

## 2021-07-14 ENCOUNTER — Ambulatory Visit: Payer: BC Managed Care – PPO | Admitting: Occupational Therapy

## 2021-07-14 ENCOUNTER — Other Ambulatory Visit: Payer: Self-pay

## 2021-07-14 DIAGNOSIS — C719 Malignant neoplasm of brain, unspecified: Secondary | ICD-10-CM | POA: Diagnosis not present

## 2021-07-14 LAB — RAD ONC ARIA SESSION SUMMARY
Course Elapsed Days: 17
Plan Fractions Treated to Date: 14
Plan Prescribed Dose Per Fraction: 2 Gy
Plan Total Fractions Prescribed: 23
Plan Total Prescribed Dose: 46 Gy
Reference Point Dosage Given to Date: 28 Gy
Reference Point Session Dosage Given: 2 Gy
Session Number: 14

## 2021-07-15 ENCOUNTER — Encounter (HOSPITAL_COMMUNITY): Payer: Self-pay

## 2021-07-15 ENCOUNTER — Other Ambulatory Visit: Payer: Self-pay

## 2021-07-15 ENCOUNTER — Ambulatory Visit
Admission: RE | Admit: 2021-07-15 | Discharge: 2021-07-15 | Disposition: A | Discharge status: Home / Self Care | Payer: BC Managed Care – PPO | Source: Ambulatory Visit | Attending: Radiation Oncology | Admitting: Radiation Oncology

## 2021-07-15 DIAGNOSIS — C719 Malignant neoplasm of brain, unspecified: Secondary | ICD-10-CM | POA: Diagnosis not present

## 2021-07-15 LAB — RAD ONC ARIA SESSION SUMMARY
Course Elapsed Days: 18
Plan Fractions Treated to Date: 15
Plan Prescribed Dose Per Fraction: 2 Gy
Plan Total Fractions Prescribed: 23
Plan Total Prescribed Dose: 46 Gy
Reference Point Dosage Given to Date: 30 Gy
Reference Point Session Dosage Given: 2 Gy
Session Number: 15

## 2021-07-18 ENCOUNTER — Ambulatory Visit
Admission: RE | Admit: 2021-07-18 | Discharge: 2021-07-18 | Disposition: A | Discharge status: Home / Self Care | Payer: BC Managed Care – PPO | Source: Ambulatory Visit | Attending: Radiation Oncology | Admitting: Radiation Oncology

## 2021-07-18 ENCOUNTER — Other Ambulatory Visit: Payer: Self-pay

## 2021-07-18 ENCOUNTER — Other Ambulatory Visit: Payer: Self-pay | Admitting: Neurology

## 2021-07-18 DIAGNOSIS — C719 Malignant neoplasm of brain, unspecified: Secondary | ICD-10-CM | POA: Diagnosis not present

## 2021-07-18 LAB — RAD ONC ARIA SESSION SUMMARY
Course Elapsed Days: 21
Plan Fractions Treated to Date: 16
Plan Prescribed Dose Per Fraction: 2 Gy
Plan Total Fractions Prescribed: 23
Plan Total Prescribed Dose: 46 Gy
Reference Point Dosage Given to Date: 32 Gy
Reference Point Session Dosage Given: 2 Gy
Session Number: 16

## 2021-07-19 ENCOUNTER — Ambulatory Visit
Admission: RE | Admit: 2021-07-19 | Discharge: 2021-07-19 | Disposition: A | Discharge status: Home / Self Care | Payer: BC Managed Care – PPO | Source: Ambulatory Visit | Attending: Radiation Oncology | Admitting: Radiation Oncology

## 2021-07-19 ENCOUNTER — Other Ambulatory Visit: Payer: Self-pay

## 2021-07-19 DIAGNOSIS — C719 Malignant neoplasm of brain, unspecified: Secondary | ICD-10-CM | POA: Diagnosis not present

## 2021-07-19 LAB — RAD ONC ARIA SESSION SUMMARY
Course Elapsed Days: 22
Plan Fractions Treated to Date: 17
Plan Prescribed Dose Per Fraction: 2 Gy
Plan Total Fractions Prescribed: 23
Plan Total Prescribed Dose: 46 Gy
Reference Point Dosage Given to Date: 34 Gy
Reference Point Session Dosage Given: 2 Gy
Session Number: 17

## 2021-07-20 ENCOUNTER — Ambulatory Visit
Admission: RE | Admit: 2021-07-20 | Discharge: 2021-07-20 | Disposition: A | Discharge status: Home / Self Care | Payer: BC Managed Care – PPO | Source: Ambulatory Visit | Attending: Radiation Oncology | Admitting: Radiation Oncology

## 2021-07-20 ENCOUNTER — Other Ambulatory Visit: Payer: Self-pay

## 2021-07-20 DIAGNOSIS — C719 Malignant neoplasm of brain, unspecified: Secondary | ICD-10-CM | POA: Diagnosis not present

## 2021-07-20 LAB — RAD ONC ARIA SESSION SUMMARY
Course Elapsed Days: 23
Plan Fractions Treated to Date: 18
Plan Prescribed Dose Per Fraction: 2 Gy
Plan Total Fractions Prescribed: 23
Plan Total Prescribed Dose: 46 Gy
Reference Point Dosage Given to Date: 36 Gy
Reference Point Session Dosage Given: 2 Gy
Session Number: 18

## 2021-07-21 ENCOUNTER — Ambulatory Visit: Payer: BC Managed Care – PPO | Admitting: Physical Therapy

## 2021-07-21 ENCOUNTER — Other Ambulatory Visit: Payer: Self-pay

## 2021-07-21 ENCOUNTER — Inpatient Hospital Stay (HOSPITAL_BASED_OUTPATIENT_CLINIC_OR_DEPARTMENT_OTHER): Payer: BC Managed Care – PPO | Admitting: Internal Medicine

## 2021-07-21 ENCOUNTER — Ambulatory Visit
Admission: RE | Admit: 2021-07-21 | Discharge: 2021-07-21 | Disposition: A | Discharge status: Home / Self Care | Payer: BC Managed Care – PPO | Source: Ambulatory Visit | Attending: Radiation Oncology | Admitting: Radiation Oncology

## 2021-07-21 ENCOUNTER — Inpatient Hospital Stay: Payer: BC Managed Care – PPO

## 2021-07-21 VITALS — BP 105/60 | HR 53 | Temp 97.7°F | Resp 20 | Wt 213.9 lb

## 2021-07-21 DIAGNOSIS — C719 Malignant neoplasm of brain, unspecified: Secondary | ICD-10-CM

## 2021-07-21 DIAGNOSIS — R569 Unspecified convulsions: Secondary | ICD-10-CM

## 2021-07-21 LAB — CMP (CANCER CENTER ONLY)
ALT: 32 U/L (ref 0–44)
AST: 16 U/L (ref 15–41)
Albumin: 4.1 g/dL (ref 3.5–5.0)
Alkaline Phosphatase: 52 U/L (ref 38–126)
Anion gap: 4 — ABNORMAL LOW (ref 5–15)
BUN: 18 mg/dL (ref 6–20)
CO2: 28 mmol/L (ref 22–32)
Calcium: 9.1 mg/dL (ref 8.9–10.3)
Chloride: 107 mmol/L (ref 98–111)
Creatinine: 1.56 mg/dL — ABNORMAL HIGH (ref 0.61–1.24)
GFR, Estimated: 53 mL/min — ABNORMAL LOW (ref >60)
Glucose, Bld: 97 mg/dL (ref 70–99)
Potassium: 4.5 mmol/L (ref 3.5–5.1)
Sodium: 139 mmol/L (ref 135–145)
Total Bilirubin: 0.5 mg/dL (ref 0.3–1.2)
Total Protein: 6.4 g/dL — ABNORMAL LOW (ref 6.5–8.1)

## 2021-07-21 LAB — CBC WITH DIFFERENTIAL (CANCER CENTER ONLY)
Abs Immature Granulocytes: 0.03 10*3/uL (ref 0.00–0.07)
Basophils Absolute: 0.2 10*3/uL — ABNORMAL HIGH (ref 0.0–0.1)
Basophils Relative: 2 %
Eosinophils Absolute: 1.5 10*3/uL — ABNORMAL HIGH (ref 0.0–0.5)
Eosinophils Relative: 14 %
HCT: 40.2 % (ref 39.0–52.0)
Hemoglobin: 13.8 g/dL (ref 13.0–17.0)
Immature Granulocytes: 0 %
Lymphocytes Relative: 11 %
Lymphs Abs: 1.2 10*3/uL (ref 0.7–4.0)
MCH: 30.2 pg (ref 26.0–34.0)
MCHC: 34.3 g/dL (ref 30.0–36.0)
MCV: 88 fL (ref 80.0–100.0)
Monocytes Absolute: 0.8 10*3/uL (ref 0.1–1.0)
Monocytes Relative: 7 %
Neutro Abs: 7 10*3/uL (ref 1.7–7.7)
Neutrophils Relative %: 66 %
Platelet Count: 258 10*3/uL (ref 150–400)
RBC: 4.57 MIL/uL (ref 4.22–5.81)
RDW: 14.3 % (ref 11.5–15.5)
WBC Count: 10.6 10*3/uL — ABNORMAL HIGH (ref 4.0–10.5)
nRBC: 0 % (ref 0.0–0.2)

## 2021-07-21 LAB — RAD ONC ARIA SESSION SUMMARY
Course Elapsed Days: 24
Plan Fractions Treated to Date: 19
Plan Prescribed Dose Per Fraction: 2 Gy
Plan Total Fractions Prescribed: 23
Plan Total Prescribed Dose: 46 Gy
Reference Point Dosage Given to Date: 38 Gy
Reference Point Session Dosage Given: 2 Gy
Session Number: 19

## 2021-07-21 NOTE — Progress Notes (Signed)
[facility] at [place], [place] [address] [phone]   Interval Evaluation  Date of Service: 07/21/21 Patient Name: Jerry Richards Patient MRN: [id number] Patient DOB: 25-Apr-1970 Provider: Ventura Sellers, MD  Identifying Statement:  [name] is a 51 y.o. male with right frontal glioblastoma   Oncologic History: Oncology History  High grade glioma not classifiable by WHO criteria [facility])  05/16/2021 Surgery   Craniotomy, R parafalcine resection with Dr. Annette Stable; path is high grade glioma IDHwt   06/20/2021 -  Chemotherapy   Patient is on Treatment Plan : BRAIN GLIOBLASTOMA Radiation Therapy With Concurrent Temozolomide 75 mg/m2 Daily Followed By Sequential Maintenance Temozolomide x 6-12 cycles        Biomarkers:  MGMT Unknown.  IDH 1/2 Wild type.  EGFR Unknown  TERT Unknown   Interval History: Jerry Richards presents today for follow up, now having completed 4 weeks of IMRT and Temodar.  No new or progressive changes.  Denies any recurrence of seizures, no headaches.  Leg symptoms are improved.    H+P (06/07/21): Patient presented to neurologic attention in March with several days of progressive left sided weakness and seizures, characterized by twitching of the left leg.  CNS imaging demonstrated a right frontal mass.  He underwent craniotomy and resection with Dr. Annette Stable on 05/16/21; path demonstrated high grade glioma IDHwt.  Following surgery his left leg was much weaker, though it has improved with aggressive rehab efforts.  Currently he is using a cane to ambulate.  He has no other complaints, continues on decadron 59m twice per day.  No seizures since surgery.  Medications: Current Outpatient Medications on File Prior to Visit  Medication Sig Dispense Refill   acetaminophen (TYLENOL) 500 MG tablet Take 1,000 mg by mouth every 6 (six) hours as needed for moderate pain.     baclofen (LIORESAL) 10 MG tablet Take 1  tablet (10 mg total) by mouth 3 (three) times daily. Please request future refills from PCP. (Patient not taking: Reported on 07/07/2021) 90 tablet 0   HYDROcodone-acetaminophen (NORCO/VICODIN) 5-325 MG tablet Take 1 tablet by mouth every 4 (four) hours as needed for moderate pain. 30 tablet 0   levETIRAcetam (KEPPRA) 500 MG tablet Take 2 tablets (1,000 mg total) by mouth 2 (two) times daily. 120 tablet 2   losartan (COZAAR) 50 MG tablet Take 50 mg by mouth daily.  6   mirtazapine (REMERON) 15 MG tablet Take 15 mg by mouth at bedtime.     ondansetron (ZOFRAN) 8 MG tablet Take 1 tablet (8 mg total) by mouth 2 (two) times daily as needed (nausea and vomiting). May take 30-60 minutes prior to Temodar administration if nausea/vomiting occurs. (Patient not taking: Reported on 07/07/2021) 30 tablet 1   rivaroxaban (XARELTO) 20 MG TABS tablet Take 1 tablet (20 mg total) by mouth daily. 90 tablet 1   temozolomide (TEMODAR) 140 MG capsule Take 1 capsule (140 mg total) by mouth daily. (Take with 20 mg capsule for total daily dose of 160 mg). May take on an empty stomach to decrease nausea & vomiting. 42 capsule 0   temozolomide (TEMODAR) 20 MG capsule Take 1 capsule (20 mg total) by mouth daily. (Take with 140 mg capsule for total daily dose of 160 mg). May take on an empty stomach to decrease nausea & vomiting. 42 capsule 0   No current facility-administered medications on file prior to visit.    Allergies:  Allergies  Allergen Reactions   Amiodarone     AMIODARONE ANALOGUES Hyperthyroidism   Morphine And Related Palpitations    Chest pressure/palpitations   Past Medical History:  Past Medical History:  Diagnosis Date   Anxiety    Atrial fibrillation ([place])    a. s/p RFCA x 2 (10/26/2011, 04/15/2012)   Atrial flutter (HCC)    Chronic kidney disease    Clotting disorder ([place])    Colitis    Current smoker    Diverticulitis    Finger near amputation, left    Hyperthyroidism    Secondary to  amiodarone   LA thrombus    Non-ischemic cardiomyopathy ([place])    OSA on CPAP    Sinus trouble    Sleep apnea    Systolic heart failure (HCC)    EF 20-25%-> 50-55% in 10/2011. MOST RECENT ECHO (03/2012) EF= 66%   Past Surgical History:  Past Surgical History:  Procedure Laterality Date   APPLICATION OF CRANIAL NAVIGATION Right 05/16/2021   Procedure: APPLICATION OF CRANIAL NAVIGATION;  Surgeon: Earnie Larsson, MD;  Location: [facility];  Service: Neurosurgery;  Laterality: Right;   ATRIAL ABLATION SURGERY     CARDIAC CATHETERIZATION  08/2011   [facility]   CHOLECYSTECTOMY     CRANIOTOMY Right 05/16/2021   Procedure: Craniotomy - right - Parietal with  brainlab;  Surgeon: Earnie Larsson, MD;  Location: [facility];  Service: Neurosurgery;  Laterality: Right;   FINGER SURGERY     LEFT HEART CATHETERIZATION WITH CORONARY ANGIOGRAM N/A 09/12/2011   Procedure: LEFT HEART CATHETERIZATION WITH CORONARY ANGIOGRAM;  Surgeon: Hillary Bow, MD;  Location: [facility] CATH LAB;  Service: Cardiovascular;  Laterality: N/A;   VASECTOMY     Social History:  Social History   Socioeconomic History   Marital status: Married    Spouse name: tanya   Number of children: 2   Years of education: Not on file   Highest education level: Some college, no degree  Occupational History   Occupation: SELF EMPLOYED    Comment: Nurse, 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 Occas tea   Social Determinants of Health   Financial Resource Strain: Not on file  Food Insecurity: Not on file   Transportation Needs: Not on file  Physical Activity: Not on file  Stress: Not on file  Social Connections: Not on file  Intimate Partner Violence: Not on file   Family History:  Family History  Problem Relation Age of Onset   Healthy Mother        age 23   Cancer Mother        melanoma   Atrial fibrillation Father        age 47   Cancer Father        melanoma   Atrial fibrillation Paternal Aunt    Atrial fibrillation Paternal Uncle    Diabetes Maternal Grandmother    Coronary  artery disease Paternal Grandmother    Heart disease Paternal Grandmother    Heart attack Paternal Grandmother    Coronary artery disease Paternal Grandfather    Heart disease Paternal Grandfather    Heart attack Paternal Grandfather    Colon cancer Neg Hx    Esophageal cancer Neg Hx    Ovarian cancer Neg Hx    Stomach cancer Neg Hx    Rectal cancer Neg Hx     Review of Systems: Constitutional: Doesn't report fevers, chills or abnormal weight loss Eyes: Doesn't report blurriness of vision Ears, nose, mouth, throat, and face: Doesn't report sore throat Respiratory: Doesn't report cough, dyspnea or wheezes Cardiovascular: Doesn't report palpitation, chest discomfort  Gastrointestinal:  Doesn't report nausea, constipation, diarrhea GU: Doesn't report incontinence Skin: Doesn't report skin rashes Neurological: Per HPI Musculoskeletal: Doesn't report joint pain Behavioral/Psych: Doesn't report anxiety  Physical Exam: Vitals:   07/21/21 0950  BP: 105/60  Pulse: (!) 53  Resp: 20  Temp: 97.7 F (36.5 C)  SpO2: 100%    KPS: 80. General: Alert, cooperative, pleasant, in no acute distress Head: Normal EENT: No conjunctival injection or scleral icterus.  Lungs: Resp effort normal Cardiac: Regular rate Abdomen: Non-distended abdomen Skin: No rashes cyanosis or petechiae. Extremities: No clubbing or edema  Neurologic Exam: Mental Status: Awake, alert, attentive to examiner. Oriented to  self and environment. Language is fluent with intact comprehension.  Cranial Nerves: Visual acuity is grossly normal. Visual fields are full. Extra-ocular movements intact. No ptosis. Face is symmetric Motor: Tone and bulk are normal. Power is 4/5 in left leg, 5/5 otherwise. Reflexes are symmetric, no pathologic reflexes present.  Sensory: Intact to light touch Gait: Hemiparetic, cane assisted.   Labs: I have reviewed the data as listed    Component Value Date/Time   NA 139 07/07/2021 0908   NA 140 03/29/2017 1045   K 4.7 07/07/2021 0908   CL 106 07/07/2021 0908   CO2 29 07/07/2021 0908   GLUCOSE 100 (H) 07/07/2021 0908   BUN 17 07/07/2021 0908   BUN 18 03/29/2017 1045   CREATININE 1.32 (H) 07/07/2021 0908   CALCIUM 9.3 07/07/2021 0908   PROT 6.9 07/07/2021 0908   PROT 7.7 03/29/2017 1045   ALBUMIN 4.2 07/07/2021 0908   ALBUMIN 5.2 03/29/2017 1045   AST 21 07/07/2021 0908   ALT 45 (H) 07/07/2021 0908   ALKPHOS 51 07/07/2021 0908   BILITOT 0.6 07/07/2021 0908   GFRNONAA >60 07/07/2021 0908   GFRAA 56 (L) 03/29/2017 1045   Lab Results  Component Value Date   WBC 10.6 (H) 07/21/2021   NEUTROABS 7.0 07/21/2021   HGB 13.8 07/21/2021   HCT 40.2 07/21/2021   MCV 88.0 07/21/2021   PLT 258 07/21/2021    Assessment/Plan High grade glioma not classifiable by WHO criteria ([place])  Focal seizures ([place])  [name] is clinically stable today, now having completed 4 weeks of IMRT and Temodar.  No clinical changes today.  We ultimately recommended continuing with course of intensity modulated radiation therapy and concurrent daily Temozolomide.  Radiation will be administered Mon-Fri over 6 weeks, Temodar will be dosed at 59m/m2 to be given daily over 42 days.  We reviewed side effects of temodar, including fatigue, nausea/vomiting, constipation, and cytopenias.  Chemotherapy should be held for the following:  ANC less than 1,000  Platelets less than 100,000  LFT or  creatinine greater than 2x ULN  If clinical concerns/contraindications develop  Every 2 weeks during radiation,  labs will be checked accompanied by a clinical evaluation in the brain tumor clinic.  Keppra will remain at 1070m BID for now.  He will continue with aggressive outpatient PT and OT.  All questions were answered. The patient knows to call the clinic with any problems, questions or concerns. No barriers to learning were detected.  The total time spent in the encounter was 30 minutes and more than 50% was on counseling and review of test results   ZVentura Sellers MD Medical Director of Neuro-Oncology [facility] [place]05/25/23 9:55 AM

## 2021-07-22 ENCOUNTER — Other Ambulatory Visit: Payer: Self-pay

## 2021-07-22 ENCOUNTER — Ambulatory Visit
Admission: RE | Admit: 2021-07-22 | Discharge: 2021-07-22 | Disposition: A | Discharge status: Home / Self Care | Payer: BC Managed Care – PPO | Source: Ambulatory Visit | Attending: Radiation Oncology | Admitting: Radiation Oncology

## 2021-07-22 DIAGNOSIS — C719 Malignant neoplasm of brain, unspecified: Secondary | ICD-10-CM | POA: Diagnosis not present

## 2021-07-22 LAB — RAD ONC ARIA SESSION SUMMARY
Course Elapsed Days: 25
Plan Fractions Treated to Date: 20
Plan Prescribed Dose Per Fraction: 2 Gy
Plan Total Fractions Prescribed: 23
Plan Total Prescribed Dose: 46 Gy
Reference Point Dosage Given to Date: 40 Gy
Reference Point Session Dosage Given: 2 Gy
Session Number: 20

## 2021-07-26 ENCOUNTER — Ambulatory Visit
Admission: RE | Admit: 2021-07-26 | Discharge: 2021-07-26 | Disposition: A | Discharge status: Home / Self Care | Payer: BC Managed Care – PPO | Source: Ambulatory Visit | Attending: Radiation Oncology | Admitting: Radiation Oncology

## 2021-07-26 ENCOUNTER — Other Ambulatory Visit: Payer: Self-pay

## 2021-07-26 DIAGNOSIS — C719 Malignant neoplasm of brain, unspecified: Secondary | ICD-10-CM | POA: Diagnosis not present

## 2021-07-26 LAB — RAD ONC ARIA SESSION SUMMARY
Course Elapsed Days: 29
Plan Fractions Treated to Date: 21
Plan Prescribed Dose Per Fraction: 2 Gy
Plan Total Fractions Prescribed: 23
Plan Total Prescribed Dose: 46 Gy
Reference Point Dosage Given to Date: 42 Gy
Reference Point Session Dosage Given: 2 Gy
Session Number: 21

## 2021-07-27 ENCOUNTER — Ambulatory Visit
Admission: RE | Admit: 2021-07-27 | Discharge: 2021-07-27 | Disposition: A | Discharge status: Home / Self Care | Payer: BC Managed Care – PPO | Source: Ambulatory Visit | Attending: Radiation Oncology | Admitting: Radiation Oncology

## 2021-07-27 ENCOUNTER — Other Ambulatory Visit: Payer: Self-pay

## 2021-07-27 DIAGNOSIS — C719 Malignant neoplasm of brain, unspecified: Secondary | ICD-10-CM | POA: Diagnosis not present

## 2021-07-27 LAB — RAD ONC ARIA SESSION SUMMARY
Course Elapsed Days: 30
Plan Fractions Treated to Date: 22
Plan Prescribed Dose Per Fraction: 2 Gy
Plan Total Fractions Prescribed: 23
Plan Total Prescribed Dose: 46 Gy
Reference Point Dosage Given to Date: 44 Gy
Reference Point Session Dosage Given: 2 Gy
Session Number: 22

## 2021-07-28 ENCOUNTER — Other Ambulatory Visit: Payer: Self-pay

## 2021-07-28 ENCOUNTER — Telehealth: Payer: Self-pay | Admitting: *Deleted

## 2021-07-28 ENCOUNTER — Ambulatory Visit
Admission: RE | Admit: 2021-07-28 | Discharge: 2021-07-28 | Disposition: A | Discharge status: Home / Self Care | Payer: BC Managed Care – PPO | Source: Ambulatory Visit | Attending: Radiation Oncology | Admitting: Radiation Oncology

## 2021-07-28 DIAGNOSIS — C719 Malignant neoplasm of brain, unspecified: Secondary | ICD-10-CM | POA: Diagnosis not present

## 2021-07-28 LAB — RAD ONC ARIA SESSION SUMMARY
Course Elapsed Days: 31
Plan Fractions Treated to Date: 23
Plan Prescribed Dose Per Fraction: 2 Gy
Plan Total Fractions Prescribed: 23
Plan Total Prescribed Dose: 46 Gy
Reference Point Dosage Given to Date: 46 Gy
Reference Point Session Dosage Given: 2 Gy
Session Number: 23

## 2021-07-28 NOTE — Telephone Encounter (Signed)
Communicated Dr Renda Rolls response about stopping Temodar to see if the rash improves.  Wife expressed understanding.

## 2021-07-28 NOTE — Telephone Encounter (Signed)
Patients wife called to report that the rash the patient had commented on last week is getting worse.  Rash and itching is on both sides of the body.  Not painful.  No blisters.    He has tried oral benadryl, hydrocortisone and benadryl creams.  He is scratching and keeps him awake at night.    He has 7 more days of radiation and oral chemotherapy.   Routing to provider to advise how to proceed.

## 2021-07-29 ENCOUNTER — Ambulatory Visit
Admission: RE | Admit: 2021-07-29 | Discharge: 2021-07-29 | Disposition: A | Discharge status: Home / Self Care | Payer: BC Managed Care – PPO | Source: Ambulatory Visit | Attending: Radiation Oncology | Admitting: Radiation Oncology

## 2021-07-29 ENCOUNTER — Other Ambulatory Visit: Payer: Self-pay

## 2021-07-29 DIAGNOSIS — C719 Malignant neoplasm of brain, unspecified: Secondary | ICD-10-CM | POA: Diagnosis not present

## 2021-07-29 LAB — RAD ONC ARIA SESSION SUMMARY
Course Elapsed Days: 32
Plan Fractions Treated to Date: 1
Plan Prescribed Dose Per Fraction: 2 Gy
Plan Total Fractions Prescribed: 7
Plan Total Prescribed Dose: 14 Gy
Reference Point Dosage Given to Date: 48 Gy
Reference Point Session Dosage Given: 2 Gy
Session Number: 24

## 2021-08-01 ENCOUNTER — Ambulatory Visit
Admission: RE | Admit: 2021-08-01 | Discharge: 2021-08-01 | Disposition: A | Discharge status: Home / Self Care | Payer: BC Managed Care – PPO | Source: Ambulatory Visit | Attending: Radiation Oncology | Admitting: Radiation Oncology

## 2021-08-01 ENCOUNTER — Other Ambulatory Visit: Payer: Self-pay

## 2021-08-01 ENCOUNTER — Ambulatory Visit: Payer: BC Managed Care – PPO

## 2021-08-01 DIAGNOSIS — C719 Malignant neoplasm of brain, unspecified: Secondary | ICD-10-CM | POA: Diagnosis not present

## 2021-08-01 LAB — RAD ONC ARIA SESSION SUMMARY
Course Elapsed Days: 35
Plan Fractions Treated to Date: 2
Plan Prescribed Dose Per Fraction: 2 Gy
Plan Total Fractions Prescribed: 7
Plan Total Prescribed Dose: 14 Gy
Reference Point Dosage Given to Date: 50 Gy
Reference Point Session Dosage Given: 2 Gy
Session Number: 25

## 2021-08-02 ENCOUNTER — Ambulatory Visit
Admission: RE | Admit: 2021-08-02 | Discharge: 2021-08-02 | Disposition: A | Discharge status: Home / Self Care | Payer: BC Managed Care – PPO | Source: Ambulatory Visit | Attending: Radiation Oncology | Admitting: Radiation Oncology

## 2021-08-02 ENCOUNTER — Telehealth: Payer: Self-pay

## 2021-08-02 ENCOUNTER — Ambulatory Visit: Payer: BC Managed Care – PPO

## 2021-08-02 ENCOUNTER — Other Ambulatory Visit: Payer: Self-pay

## 2021-08-02 DIAGNOSIS — C719 Malignant neoplasm of brain, unspecified: Secondary | ICD-10-CM | POA: Diagnosis not present

## 2021-08-02 LAB — RAD ONC ARIA SESSION SUMMARY
Course Elapsed Days: 36
Plan Fractions Treated to Date: 3
Plan Prescribed Dose Per Fraction: 2 Gy
Plan Total Fractions Prescribed: 7
Plan Total Prescribed Dose: 14 Gy
Reference Point Dosage Given to Date: 52 Gy
Reference Point Session Dosage Given: 2 Gy
Session Number: 26

## 2021-08-02 NOTE — Telephone Encounter (Signed)
Spoke with patients wife Jerry Richards regarding a work excuse letter that was requested. This LPN wrote the work excuse letter and has printed it out, signed by Dr. Mickeal Skinner as well. She is aware and verbalized understanding this will be sitting at the front desk for when she is available to pick it up. No further questions or concerns.

## 2021-08-03 ENCOUNTER — Ambulatory Visit
Admission: RE | Admit: 2021-08-03 | Discharge: 2021-08-03 | Disposition: A | Discharge status: Home / Self Care | Payer: BC Managed Care – PPO | Source: Ambulatory Visit | Attending: Radiation Oncology | Admitting: Radiation Oncology

## 2021-08-03 ENCOUNTER — Other Ambulatory Visit: Payer: Self-pay

## 2021-08-03 DIAGNOSIS — C719 Malignant neoplasm of brain, unspecified: Secondary | ICD-10-CM | POA: Diagnosis not present

## 2021-08-03 LAB — RAD ONC ARIA SESSION SUMMARY
Course Elapsed Days: 37
Plan Fractions Treated to Date: 4
Plan Prescribed Dose Per Fraction: 2 Gy
Plan Total Fractions Prescribed: 7
Plan Total Prescribed Dose: 14 Gy
Reference Point Dosage Given to Date: 54 Gy
Reference Point Session Dosage Given: 2 Gy
Session Number: 27

## 2021-08-04 ENCOUNTER — Ambulatory Visit
Admission: RE | Admit: 2021-08-04 | Discharge: 2021-08-04 | Disposition: A | Discharge status: Home / Self Care | Payer: BC Managed Care – PPO | Source: Ambulatory Visit | Attending: Radiation Oncology | Admitting: Radiation Oncology

## 2021-08-04 ENCOUNTER — Inpatient Hospital Stay: Payer: BC Managed Care – PPO | Admitting: Internal Medicine

## 2021-08-04 ENCOUNTER — Other Ambulatory Visit: Payer: Self-pay

## 2021-08-04 ENCOUNTER — Inpatient Hospital Stay: Payer: BC Managed Care – PPO

## 2021-08-04 VITALS — BP 99/74 | HR 52 | Temp 98.3°F | Resp 17 | Ht 72.0 in | Wt 213.8 lb

## 2021-08-04 DIAGNOSIS — Z833 Family history of diabetes mellitus: Secondary | ICD-10-CM | POA: Insufficient documentation

## 2021-08-04 DIAGNOSIS — Z79630 Long term (current) use of alkylating agent: Secondary | ICD-10-CM | POA: Insufficient documentation

## 2021-08-04 DIAGNOSIS — R21 Rash and other nonspecific skin eruption: Secondary | ICD-10-CM | POA: Insufficient documentation

## 2021-08-04 DIAGNOSIS — R569 Unspecified convulsions: Secondary | ICD-10-CM

## 2021-08-04 DIAGNOSIS — Z885 Allergy status to narcotic agent status: Secondary | ICD-10-CM | POA: Insufficient documentation

## 2021-08-04 DIAGNOSIS — Z808 Family history of malignant neoplasm of other organs or systems: Secondary | ICD-10-CM | POA: Insufficient documentation

## 2021-08-04 DIAGNOSIS — C719 Malignant neoplasm of brain, unspecified: Secondary | ICD-10-CM

## 2021-08-04 DIAGNOSIS — F17210 Nicotine dependence, cigarettes, uncomplicated: Secondary | ICD-10-CM | POA: Insufficient documentation

## 2021-08-04 DIAGNOSIS — Z7901 Long term (current) use of anticoagulants: Secondary | ICD-10-CM | POA: Insufficient documentation

## 2021-08-04 DIAGNOSIS — Z8249 Family history of ischemic heart disease and other diseases of the circulatory system: Secondary | ICD-10-CM | POA: Insufficient documentation

## 2021-08-04 DIAGNOSIS — Z79899 Other long term (current) drug therapy: Secondary | ICD-10-CM | POA: Insufficient documentation

## 2021-08-04 DIAGNOSIS — Z9049 Acquired absence of other specified parts of digestive tract: Secondary | ICD-10-CM | POA: Insufficient documentation

## 2021-08-04 DIAGNOSIS — C711 Malignant neoplasm of frontal lobe: Secondary | ICD-10-CM | POA: Insufficient documentation

## 2021-08-04 LAB — CMP (CANCER CENTER ONLY)
ALT: 25 U/L (ref 0–44)
AST: 18 U/L (ref 15–41)
Albumin: 4.2 g/dL (ref 3.5–5.0)
Alkaline Phosphatase: 45 U/L (ref 38–126)
Anion gap: 4 — ABNORMAL LOW (ref 5–15)
BUN: 14 mg/dL (ref 6–20)
CO2: 28 mmol/L (ref 22–32)
Calcium: 9.7 mg/dL (ref 8.9–10.3)
Chloride: 108 mmol/L (ref 98–111)
Creatinine: 1.32 mg/dL — ABNORMAL HIGH (ref 0.61–1.24)
GFR, Estimated: >60 mL/min (ref >60)
Glucose, Bld: 98 mg/dL (ref 70–99)
Potassium: 4.7 mmol/L (ref 3.5–5.1)
Sodium: 140 mmol/L (ref 135–145)
Total Bilirubin: 0.6 mg/dL (ref 0.3–1.2)
Total Protein: 6.4 g/dL — ABNORMAL LOW (ref 6.5–8.1)

## 2021-08-04 LAB — RAD ONC ARIA SESSION SUMMARY
Course Elapsed Days: 38
Plan Fractions Treated to Date: 5
Plan Prescribed Dose Per Fraction: 2 Gy
Plan Total Fractions Prescribed: 7
Plan Total Prescribed Dose: 14 Gy
Reference Point Dosage Given to Date: 56 Gy
Reference Point Session Dosage Given: 2 Gy
Session Number: 28

## 2021-08-04 LAB — CBC WITH DIFFERENTIAL (CANCER CENTER ONLY)
Abs Immature Granulocytes: 0.02 10*3/uL (ref 0.00–0.07)
Basophils Absolute: 0.1 10*3/uL (ref 0.0–0.1)
Basophils Relative: 2 %
Eosinophils Absolute: 1.2 10*3/uL — ABNORMAL HIGH (ref 0.0–0.5)
Eosinophils Relative: 17 %
HCT: 41.2 % (ref 39.0–52.0)
Hemoglobin: 14 g/dL (ref 13.0–17.0)
Immature Granulocytes: 0 %
Lymphocytes Relative: 16 %
Lymphs Abs: 1.1 10*3/uL (ref 0.7–4.0)
MCH: 29.5 pg (ref 26.0–34.0)
MCHC: 34 g/dL (ref 30.0–36.0)
MCV: 86.9 fL (ref 80.0–100.0)
Monocytes Absolute: 0.6 10*3/uL (ref 0.1–1.0)
Monocytes Relative: 8 %
Neutro Abs: 4.1 10*3/uL (ref 1.7–7.7)
Neutrophils Relative %: 57 %
Platelet Count: 166 10*3/uL (ref 150–400)
RBC: 4.74 MIL/uL (ref 4.22–5.81)
RDW: 14.6 % (ref 11.5–15.5)
WBC Count: 7.1 10*3/uL (ref 4.0–10.5)
nRBC: 0 % (ref 0.0–0.2)

## 2021-08-04 NOTE — Progress Notes (Signed)
[facility] at [place], [place] [address] [phone]   Interval Evaluation  Date of Service: 08/04/21 Patient Name: Jerry Richards Patient MRN: [id number] Patient DOB: May 10, 1970 Provider: Ventura Sellers, MD  Identifying Statement:  Jerry Richards is a 51 y.o. male with right frontal glioblastoma   Oncologic History: Oncology History  High grade glioma not classifiable by WHO criteria [facility])  05/16/2021 Surgery   Craniotomy, R parafalcine resection with Dr. Annette Stable; path is high grade glioma IDHwt   06/20/2021 -  Chemotherapy   Patient is on Treatment Plan : BRAIN GLIOBLASTOMA Radiation Therapy With Concurrent Temozolomide 75 mg/m2 Daily Followed By Sequential Maintenance Temozolomide x 6-12 cycles       Biomarkers:  MGMT Unknown.  IDH 1/2 Wild type.  EGFR Unknown  TERT Unknown   Interval History: Jerry Richards presents today for follow up, now having completed 6 weeks of IMRT and Temodar.  Rash is improved since stopping Temodar.  No clinical changes today.  Denies any recurrence of seizures, no headaches.  Leg symptoms are stable.    H+P (06/07/21): Patient presented to neurologic attention in March with several days of progressive left sided weakness and seizures, characterized by twitching of the left leg.  CNS imaging demonstrated a right frontal mass.  He underwent craniotomy and resection with Dr. Annette Stable on 05/16/21; path demonstrated high grade glioma IDHwt.  Following surgery his left leg was much weaker, though it has improved with aggressive rehab efforts.  Currently he is using a cane to ambulate.  He has no other complaints, continues on decadron 60m twice per day.  No seizures since surgery.  Medications: Current Outpatient Medications on File Prior to Visit  Medication Sig Dispense Refill   acetaminophen (TYLENOL) 500 MG tablet Take 1,000 mg by mouth every 6 (six) hours as needed for moderate pain.      levETIRAcetam (KEPPRA) 500 MG tablet Take 2 tablets (1,000 mg total) by mouth 2 (two) times daily. 120 tablet 2   losartan (COZAAR) 50 MG tablet Take 50 mg by mouth daily.  6   mirtazapine (REMERON) 15 MG tablet Take 15 mg by mouth at bedtime.     rivaroxaban (XARELTO) 20 MG TABS tablet Take 1 tablet (20 mg total) by mouth daily. 90 tablet 1   baclofen (LIORESAL) 10 MG tablet Take 1 tablet (10 mg total) by mouth 3 (three) times daily. Please request future refills from PCP. (Patient not taking: Reported on 07/07/2021) 90 tablet 0   HYDROcodone-acetaminophen (NORCO/VICODIN) 5-325 MG tablet Take 1 tablet by mouth every 4 (four) hours as needed for moderate pain. (Patient not taking: Reported on 08/04/2021) 30 tablet 0   ondansetron (ZOFRAN) 8 MG tablet Take 1 tablet (8 mg total) by mouth 2 (two) times daily as needed (nausea and vomiting). May take 30-60 minutes prior to Temodar administration if nausea/vomiting occurs. (Patient not taking: Reported on 07/07/2021) 30 tablet 1   temozolomide (TEMODAR) 140 MG capsule Take 1 capsule (140 mg total) by mouth daily. (Take with 20 mg capsule for total daily dose of 160 mg). May take on an empty stomach to decrease nausea & vomiting. (Patient not taking: Reported on 08/04/2021) 42 capsule 0   temozolomide (TEMODAR) 20 MG capsule Take 1 capsule (20 mg total) by mouth daily. (Take with 140 mg capsule for total daily dose of 160 mg). May take on an empty stomach to decrease nausea & vomiting. (Patient not taking:  Reported on 08/04/2021) 42 capsule 0   No current facility-administered medications on file prior to visit.    Allergies:  Allergies  Allergen Reactions   Amiodarone     AMIODARONE ANALOGUES Hyperthyroidism   Morphine And Related Palpitations    Chest pressure/palpitations   Past Medical History:  Past Medical History:  Diagnosis Date   Anxiety    Atrial fibrillation ([place])    a. s/p RFCA x 2 (10/26/2011, 04/15/2012)   Atrial flutter ([facility])    Chronic  kidney disease    Clotting disorder ([place])    Colitis    Current smoker    Diverticulitis    Finger near amputation, left    Hyperthyroidism    Secondary to amiodarone   LA thrombus    Non-ischemic cardiomyopathy ([place])    OSA on CPAP    Sinus trouble    Sleep apnea    Systolic heart failure (HCC)    EF 20-25%-> 50-55% in 10/2011. MOST RECENT ECHO (03/2012) EF= 66%   Past Surgical History:  Past Surgical History:  Procedure Laterality Date   APPLICATION OF CRANIAL NAVIGATION Right 05/16/2021   Procedure: APPLICATION OF CRANIAL NAVIGATION;  Surgeon: Earnie Larsson, MD;  Location: [facility];  Service: Neurosurgery;  Laterality: Right;   ATRIAL ABLATION SURGERY     CARDIAC CATHETERIZATION  08/2011   [facility]   CHOLECYSTECTOMY     CRANIOTOMY Right 05/16/2021   Procedure: Craniotomy - right - Parietal with  brainlab;  Surgeon: Earnie Larsson, MD;  Location: [facility];  Service: Neurosurgery;  Laterality: Right;   FINGER SURGERY     LEFT HEART CATHETERIZATION WITH CORONARY ANGIOGRAM N/A 09/12/2011   Procedure: LEFT HEART CATHETERIZATION WITH CORONARY ANGIOGRAM;  Surgeon: Hillary Bow, MD;  Location[facility] C[facility];  Service: Cardiovascular;  Laterality: N/A;   VASECTOMY     Social History:  Social History   Socioeconomic History   Marital status: Married    Spouse name: tanya   Number of children: 2   Years of education: Not on file   Highest education level: Some college, no degree  Occupational History   Occupation: SELF EMPLOYED    Comment: Nurse, 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 Occas tea   Social Determinants of Health   Financial Resource Strain: Not on file  Food Insecurity: Not on file  Transportation Needs: Not on file  Physical Activity: Not on file  Stress: Not on file  Social Connections: Not on file  Intimate Partner Violence: Not on file   Family History:  Family History  Problem Relation Age of Onset   Healthy Mother        age 32   Cancer Mother        melanoma   Atrial fibrillation Father        age 71   Cancer Father  melanoma   Atrial fibrillation Paternal Aunt    Atrial fibrillation Paternal Uncle    Diabetes Maternal Grandmother    Coronary artery disease Paternal Grandmother    Heart disease Paternal Grandmother    Heart attack Paternal Grandmother    Coronary artery disease Paternal Grandfather    Heart disease Paternal Grandfather    Heart attack Paternal Grandfather    Colon cancer Neg Hx    Esophageal cancer Neg Hx    Ovarian cancer Neg Hx    Stomach cancer Neg Hx    Rectal cancer Neg Hx     Review of Systems: Constitutional: Doesn't report fevers, chills or abnormal weight loss Eyes: Doesn't report blurriness of vision Ears, nose, mouth, throat, and face: Doesn't report sore throat Respiratory: Doesn't report cough, dyspnea or wheezes Cardiovascular: Doesn't report palpitation, chest discomfort  Gastrointestinal:  Doesn't report nausea, constipation, diarrhea GU: Doesn't report incontinence Skin: Doesn't report skin rashes Neurological: Per HPI Musculoskeletal: Doesn't report joint pain Behavioral/Psych: Doesn't report anxiety  Physical Exam: Vitals:   08/04/21 0957  BP: 99/74  Pulse: (!) 52  Resp: 17  Temp: 98.3 F (36.8 C)  SpO2: 100%    KPS: 80. General: Alert, cooperative, pleasant, in no acute distress Head: Normal EENT: No conjunctival injection or scleral icterus.  Lungs: Resp effort normal Cardiac: Regular rate Abdomen: Non-distended  abdomen Skin: No rashes cyanosis or petechiae. Extremities: No clubbing or edema  Neurologic Exam: Mental Status: Awake, alert, attentive to examiner. Oriented to self and environment. Language is fluent with intact comprehension.  Cranial Nerves: Visual acuity is grossly normal. Visual fields are full. Extra-ocular movements intact. No ptosis. Face is symmetric Motor: Tone and bulk are normal. Power is 4/5 in left leg, 5/5 otherwise. Reflexes are symmetric, no pathologic reflexes present.  Sensory: Intact to light touch Gait: Hemiparetic, cane assisted.   Labs: I have reviewed the data as listed    Component Value Date/Time   NA 140 08/04/2021 0910   NA 140 03/29/2017 1045   K 4.7 08/04/2021 0910   CL 108 08/04/2021 0910   CO2 28 08/04/2021 0910   GLUCOSE 98 08/04/2021 0910   BUN 14 08/04/2021 0910   BUN 18 03/29/2017 1045   CREATININE 1.32 (H) 08/04/2021 0910   CALCIUM 9.7 08/04/2021 0910   PROT 6.4 (L) 08/04/2021 0910   PROT 7.7 03/29/2017 1045   ALBUMIN 4.2 08/04/2021 0910   ALBUMIN 5.2 03/29/2017 1045   AST 18 08/04/2021 0910   ALT 25 08/04/2021 0910   ALKPHOS 45 08/04/2021 0910   BILITOT 0.6 08/04/2021 0910   GFRNONAA >60 08/04/2021 0910   GFRAA 56 (L) 03/29/2017 1045   Lab Results  Component Value Date   WBC 7.1 08/04/2021   NEUTROABS 4.1 08/04/2021   HGB 14.0 08/04/2021   HCT 41.2 08/04/2021   MCV 86.9 08/04/2021   PLT 166 08/04/2021    Assessment/Plan High grade glioma not classifiable by WHO criteria ([place])  Focal seizures ([place])  Jerry Richards is clinically stable today, now having completed 4 weeks of IMRT and Temodar.  He stopped Temodar due to rash which has now resolved.    We ultimately recommended completing course of intensity modulated radiation therapy.  Keppra will remain at 1066m BID for now.  He will continue with aggressive outpatient PT and OT.  We ask that WCorrin Parkerreturn to clinic in 1 months following next brain MRI, or  sooner as needed.  All questions were answered. The patient  knows to call the clinic with any problems, questions or concerns. No barriers to learning were detected.  The total time spent in the encounter was 30 minutes and more than 50% was on counseling and review of test results   Ventura Sellers, MD Medical Director of Neuro-Oncology [facility] [place] 08/04/21 10:14 AM

## 2021-08-05 ENCOUNTER — Telehealth: Payer: Self-pay | Admitting: Internal Medicine

## 2021-08-05 ENCOUNTER — Other Ambulatory Visit: Payer: Self-pay

## 2021-08-05 ENCOUNTER — Ambulatory Visit
Admission: RE | Admit: 2021-08-05 | Discharge: 2021-08-05 | Disposition: A | Discharge status: Home / Self Care | Payer: BC Managed Care – PPO | Source: Ambulatory Visit | Attending: Radiation Oncology | Admitting: Radiation Oncology

## 2021-08-05 DIAGNOSIS — C719 Malignant neoplasm of brain, unspecified: Secondary | ICD-10-CM | POA: Diagnosis not present

## 2021-08-05 LAB — RAD ONC ARIA SESSION SUMMARY
Course Elapsed Days: 39
Plan Fractions Treated to Date: 6
Plan Prescribed Dose Per Fraction: 2 Gy
Plan Total Fractions Prescribed: 7
Plan Total Prescribed Dose: 14 Gy
Reference Point Dosage Given to Date: 58 Gy
Reference Point Session Dosage Given: 2 Gy
Session Number: 29

## 2021-08-05 NOTE — Telephone Encounter (Signed)
Scheduled per 6/8 los, pt has been called nad confirmed

## 2021-08-08 ENCOUNTER — Other Ambulatory Visit: Payer: Self-pay

## 2021-08-08 ENCOUNTER — Encounter: Payer: Self-pay | Admitting: Radiation Oncology

## 2021-08-08 ENCOUNTER — Ambulatory Visit
Admission: RE | Admit: 2021-08-08 | Discharge: 2021-08-08 | Disposition: A | Discharge status: Home / Self Care | Payer: BC Managed Care – PPO | Source: Ambulatory Visit | Attending: Radiation Oncology | Admitting: Radiation Oncology

## 2021-08-08 DIAGNOSIS — C719 Malignant neoplasm of brain, unspecified: Secondary | ICD-10-CM | POA: Diagnosis not present

## 2021-08-08 LAB — RAD ONC ARIA SESSION SUMMARY
Course Elapsed Days: 42
Plan Fractions Treated to Date: 7
Plan Prescribed Dose Per Fraction: 2 Gy
Plan Total Fractions Prescribed: 7
Plan Total Prescribed Dose: 14 Gy
Reference Point Dosage Given to Date: 60 Gy
Reference Point Session Dosage Given: 2 Gy
Session Number: 30

## 2021-08-15 ENCOUNTER — Other Ambulatory Visit: Payer: Self-pay | Admitting: Radiation Therapy

## 2021-08-15 ENCOUNTER — Telehealth: Payer: Self-pay | Admitting: *Deleted

## 2021-08-15 NOTE — Telephone Encounter (Signed)
Wife called requesting letter on letter head stating reason why patient could not return to work.  Need to be faxed to [email or web address]

## 2021-08-26 ENCOUNTER — Ambulatory Visit (HOSPITAL_COMMUNITY)
Admission: RE | Admit: 2021-08-26 | Discharge: 2021-08-26 | Disposition: A | Discharge status: Home / Self Care | Payer: BC Managed Care – PPO | Source: Ambulatory Visit | Attending: Internal Medicine | Admitting: Internal Medicine

## 2021-08-26 DIAGNOSIS — C719 Malignant neoplasm of brain, unspecified: Secondary | ICD-10-CM | POA: Diagnosis present

## 2021-08-26 MED ORDER — GADOBUTROL 1 MMOL/ML IV SOLN
10.0000 mL | Freq: Once | INTRAVENOUS | Status: AC | PRN
  Administered 2021-08-26: 10 mL via INTRAVENOUS

## 2021-09-01 ENCOUNTER — Other Ambulatory Visit: Payer: Self-pay

## 2021-09-01 ENCOUNTER — Inpatient Hospital Stay: Payer: BC Managed Care – PPO | Attending: Neurosurgery | Admitting: Internal Medicine

## 2021-09-01 ENCOUNTER — Other Ambulatory Visit (HOSPITAL_COMMUNITY): Payer: Self-pay

## 2021-09-01 ENCOUNTER — Telehealth: Payer: Self-pay | Admitting: Pharmacist

## 2021-09-01 VITALS — BP 104/58 | HR 51 | Temp 98.7°F | Resp 16 | Ht 72.0 in | Wt 216.0 lb

## 2021-09-01 DIAGNOSIS — Z833 Family history of diabetes mellitus: Secondary | ICD-10-CM | POA: Insufficient documentation

## 2021-09-01 DIAGNOSIS — C719 Malignant neoplasm of brain, unspecified: Secondary | ICD-10-CM | POA: Diagnosis not present

## 2021-09-01 DIAGNOSIS — Z808 Family history of malignant neoplasm of other organs or systems: Secondary | ICD-10-CM | POA: Insufficient documentation

## 2021-09-01 DIAGNOSIS — Z8249 Family history of ischemic heart disease and other diseases of the circulatory system: Secondary | ICD-10-CM | POA: Insufficient documentation

## 2021-09-01 DIAGNOSIS — Z7901 Long term (current) use of anticoagulants: Secondary | ICD-10-CM | POA: Insufficient documentation

## 2021-09-01 DIAGNOSIS — C711 Malignant neoplasm of frontal lobe: Secondary | ICD-10-CM | POA: Diagnosis not present

## 2021-09-01 DIAGNOSIS — F17210 Nicotine dependence, cigarettes, uncomplicated: Secondary | ICD-10-CM | POA: Diagnosis not present

## 2021-09-01 DIAGNOSIS — R569 Unspecified convulsions: Secondary | ICD-10-CM

## 2021-09-01 DIAGNOSIS — Z79630 Long term (current) use of alkylating agent: Secondary | ICD-10-CM | POA: Diagnosis not present

## 2021-09-01 DIAGNOSIS — Z79899 Other long term (current) drug therapy: Secondary | ICD-10-CM | POA: Diagnosis not present

## 2021-09-01 DIAGNOSIS — Z9049 Acquired absence of other specified parts of digestive tract: Secondary | ICD-10-CM | POA: Diagnosis not present

## 2021-09-01 DIAGNOSIS — R531 Weakness: Secondary | ICD-10-CM | POA: Insufficient documentation

## 2021-09-01 DIAGNOSIS — Z885 Allergy status to narcotic agent status: Secondary | ICD-10-CM | POA: Diagnosis not present

## 2021-09-01 MED ORDER — TEMOZOLOMIDE 140 MG PO CAPS: 140.0000 mg | ORAL_CAPSULE | Freq: Every day | ORAL | 0 refills | Status: DC

## 2021-09-01 MED ORDER — ONDANSETRON HCL 8 MG PO TABS: 8.0000 mg | ORAL_TABLET | Freq: Two times a day (BID) | ORAL | 1 refills | Status: DC | PRN

## 2021-09-01 MED ORDER — TEMOZOLOMIDE 180 MG PO CAPS: 180.0000 mg | ORAL_CAPSULE | Freq: Every day | ORAL | 0 refills | Status: DC

## 2021-09-01 NOTE — Progress Notes (Signed)
DISCONTINUE ON PATHWAY REGIMEN - Neuro ? ? ?  One cycle, concurrent with RT: ?    Temozolomide  ? ?**Always confirm dose/schedule in your pharmacy ordering system** ? ?REASON: Continuation Of Treatment ?PRIOR TREATMENT: [id number]: Radiation Therapy with Concurrent Temozolomide 75 mg/m2 Daily x 6 Weeks, Followed by Adjuvant Temozolomide ?TREATMENT RESPONSE: Stable Disease ([facility]) ? ?START ON PATHWAY REGIMEN - Neuro ? ? ?  A cycle is every 28 days: ?    Temozolomide  ?    Temozolomide  ? ?**Always confirm dose/schedule in your pharmacy ordering system** ? ?Patient Characteristics: ?Glioma, Glioblastoma, IDH-wildtype, Newly Diagnosed / Treatment Naive, Good Performance Status and/or Younger Patient, MGMT Promoter Unmethylated/Unknown ?Disease Classification: Glioma ?Disease Classification: Glioblastoma, IDH-wildtype ?Disease Status: Newly Diagnosed / Treatment Naive ?Performance Status: Good Performance Status and/or Younger Patient ?MGMT Promoter Methylation Status: Awaiting Test Results ?Intent of Therapy: ?Non-Curative / Palliative Intent, Discussed with Patient ?

## 2021-09-01 NOTE — Telephone Encounter (Signed)
Oral Oncology Pharmacist Encounter  Received new prescription for Temodar (temozolomide) for the maintenance treatment of glioblastoma, planned duration ~6-12 cycles.  CBC w/ Diff and CMP from 08/04/21 assessed, patient with Scr 1.32 mg/dL (CrCl ~89 ml/min). No baseline dose adjustments required. Prescription dose and frequency assessed for appropriateness. Appropriate for therapy initiation.   Current medication list in Epic reviewed, no relevant/significant DDIs with Temodar identified.  Evaluated chart and no patient barriers to medication adherence noted.   Patient agreement for treatment documented in MD note on 09/01/21.  Prescription has been e-scribed to [facility] due to insurance requirements.   Oral Oncology Clinic will continue to follow for insurance authorization, copayment issues, initial counseling and start date.  Leron Croak, PharmD, BCPS Hematology/Oncology Clinical Pharmacist [facility] and [place] [phone] 09/01/2021 12:15 PM

## 2021-09-01 NOTE — Progress Notes (Signed)
[facility] [place], [place] [address] [phone]   Interval Evaluation  Date of Service: 09/01/21 Patient Name: Jerry Richards Patient MRN: [id number] Patient DOB: [birth year] Provider: Ventura Sellers, MD  Identifying Statement:  Jerry Richards is a 51 y.o. male with right frontal glioblastoma   Oncologic History: Oncology History  High grade glioma not classifiable by WHO criteria [facility])  05/16/2021 Surgery   Craniotomy, R parafalcine resection with Dr. Annette Stable; path is high grade glioma IDHwt   06/20/2021 -  Chemotherapy   Patient is on Treatment Plan : BRAIN GLIOBLASTOMA Radiation Therapy With Concurrent Temozolomide 75 mg/m2 Daily Followed By Sequential Maintenance Temozolomide x 6-12 cycles       Biomarkers:  MGMT Unknown.  IDH 1/2 Wild type.  EGFR Unknown  TERT Unknown   Interval History: Jerry Richards presents today for follow up, now having completed post IMRT and Temodar MRI study.  No clinical changes today.  Denies any recurrence of seizures, no headaches.  Leg symptoms are stable.    H+P (06/07/21): Patient presented to neurologic attention in March with several days of progressive left sided weakness and seizures, characterized by twitching of the left leg.  CNS imaging demonstrated a right frontal mass.  He underwent craniotomy and resection with Dr. Annette Stable on 05/16/21; path demonstrated high grade glioma IDHwt.  Following surgery his left leg was much weaker, though it has improved with aggressive rehab efforts.  Currently he is using a cane to ambulate.  He has no other complaints, continues on decadron 82m twice per day.  No seizures since surgery.  Medications: Current Outpatient Medications on File Prior to Visit  Medication Sig Dispense Refill   acetaminophen (TYLENOL) 500 MG tablet Take 1,000 mg by mouth every 6 (six) hours as needed for moderate pain.     baclofen (LIORESAL) 10 MG tablet Take 1 tablet  (10 mg total) by mouth 3 (three) times daily. Please request future refills from PCP. (Patient not taking: Reported on 07/07/2021) 90 tablet 0   HYDROcodone-acetaminophen (NORCO/VICODIN) 5-325 MG tablet Take 1 tablet by mouth every 4 (four) hours as needed for moderate pain. (Patient not taking: Reported on 08/04/2021) 30 tablet 0   levETIRAcetam (KEPPRA) 500 MG tablet Take 2 tablets (1,000 mg total) by mouth 2 (two) times daily. 120 tablet 2   losartan (COZAAR) 50 MG tablet Take 50 mg by mouth daily.  6   mirtazapine (REMERON) 15 MG tablet Take 15 mg by mouth at bedtime.     ondansetron (ZOFRAN) 8 MG tablet Take 1 tablet (8 mg total) by mouth 2 (two) times daily as needed (nausea and vomiting). May take 30-60 minutes prior to Temodar administration if nausea/vomiting occurs. (Patient not taking: Reported on 07/07/2021) 30 tablet 1   rivaroxaban (XARELTO) 20 MG TABS tablet Take 1 tablet (20 mg total) by mouth daily. 90 tablet 1   temozolomide (TEMODAR) 140 MG capsule Take 1 capsule (140 mg total) by mouth daily. (Take with 20 mg capsule for total daily dose of 160 mg). May take on an empty stomach to decrease nausea & vomiting. (Patient not taking: Reported on 08/04/2021) 42 capsule 0   temozolomide (TEMODAR) 20 MG capsule Take 1 capsule (20 mg total) by mouth daily. (Take with 140 mg capsule for total daily dose of 160 mg). May take on an empty stomach to decrease nausea & vomiting. (Patient not taking: Reported on 08/04/2021) 42 capsule 0  No current facility-administered medications on file prior to visit.    Allergies:  Allergies  Allergen Reactions   Amiodarone     AMIODARONE ANALOGUES Hyperthyroidism   Morphine And Related Palpitations    Chest pressure/palpitations   Past Medical History:  Past Medical History:  Diagnosis Date   Anxiety    Atrial fibrillation ([place])    a. s/p RFCA x 2 (10/26/2011, 04/15/2012)   Atrial flutter ([facility])    Chronic kidney disease    Clotting disorder ([place])     Colitis    Current smoker    Diverticulitis    Finger near amputation, left    Hyperthyroidism    Secondary to amiodarone   LA thrombus    Non-ischemic cardiomyopathy ([place])    OSA on CPAP    Sinus trouble    Sleep apnea    Systolic heart failure (HCC)    EF 20-25%-> 50-55% in 10/2011. MOST RECENT ECHO (03/2012) EF= 66%   Past Surgical History:  Past Surgical History:  Procedure Laterality Date   APPLICATION OF CRANIAL NAVIGATION Right 05/16/2021   Procedure: APPLICATION OF CRANIAL NAVIGATION;  Surgeon: Earnie Larsson, MD;  Location: [facility];  Service: Neurosurgery;  Laterality: Right;   ATRIAL ABLATION SURGERY     CARDIAC CATHETERIZATION  08/2011   [facility]   CHOLECYSTECTOMY     CRANIOTOMY Right 05/16/2021   Procedure: Craniotomy - right - Parietal with  brainlab;  Surgeon: Earnie Larsson, MD;  Location: [facility];  Service: Neurosurgery;  Laterality: Right;   FINGER SURGERY     LEFT HEART CATHETERIZATION WITH CORONARY ANGIOGRAM N/A 09/12/2011   Procedure: LEFT HEART CATHETERIZATION WITH CORONARY ANGIOGRAM;  Surgeon: Hillary Bow, MD;  [facility] C[facility] LAB;  Service: Cardiovascular;  Laterality: N/A;   VASECTOMY     Social History:  Social History   Socioeconomic History   Marital status: Married    Spouse name: tanya   Number of children: 2   Years of education: Not on file   Highest education level: Some college, no degree  Occupational History   Occupation: SELF EMPLOYED    Comment: Nurse, 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  Occas tea   Social Determinants of Health   Financial Resource Strain: Not on file  Food Insecurity: Not on file  Transportation Needs: Not on file  Physical Activity: Not on file  Stress: Not on file  Social Connections: Not on file  Intimate Partner Violence: Not on file   Family History:  Family History  Problem Relation Age of Onset   Healthy Mother        age 2   Cancer Mother        melanoma   Atrial fibrillation Father        age 72   Cancer Father        melanoma   Atrial fibrillation Paternal Aunt  Atrial fibrillation Paternal Uncle    Diabetes Maternal Grandmother    Coronary artery disease Paternal Grandmother    Heart disease Paternal Grandmother    Heart attack Paternal Grandmother    Coronary artery disease Paternal Grandfather    Heart disease Paternal Grandfather    Heart attack Paternal Grandfather    Colon cancer Neg Hx    Esophageal cancer Neg Hx    Ovarian cancer Neg Hx    Stomach cancer Neg Hx    Rectal cancer Neg Hx     Review of Systems: Constitutional: Doesn't report fevers, chills or abnormal weight loss Eyes: Doesn't report blurriness of vision Ears, nose, mouth, throat, and face: Doesn't report sore throat Respiratory: Doesn't report cough, dyspnea or wheezes Cardiovascular: Doesn't report palpitation, chest discomfort  Gastrointestinal:  Doesn't report nausea, constipation, diarrhea GU: Doesn't report incontinence Skin: Doesn't report skin rashes Neurological: Per HPI Musculoskeletal: Doesn't report joint pain Behavioral/Psych: Doesn't report anxiety  Physical Exam: There were no vitals filed for this visit.  KPS: 80. General: Alert, cooperative, pleasant, in no acute distress Head: Normal EENT: No conjunctival injection or scleral icterus.  Lungs: Resp effort normal Cardiac: Regular rate Abdomen: Non-distended abdomen Skin: No rashes cyanosis or petechiae. Extremities: No clubbing or edema  Neurologic Exam: Mental  Status: Awake, alert, attentive to examiner. Oriented to self and environment. Language is fluent with intact comprehension.  Cranial Nerves: Visual acuity is grossly normal. Visual fields are full. Extra-ocular movements intact. No ptosis. Face is symmetric Motor: Tone and bulk are normal. Power is 4/5 in left leg, 5/5 otherwise. Reflexes are symmetric, no pathologic reflexes present.  Sensory: Intact to light touch Gait: Hemiparetic, cane assisted.   Labs: I have reviewed the data as listed    Component Value Date/Time   NA 140 08/04/2021 0910   NA 140 03/29/2017 1045   K 4.7 08/04/2021 0910   CL 108 08/04/2021 0910   CO2 28 08/04/2021 0910   GLUCOSE 98 08/04/2021 0910   BUN 14 08/04/2021 0910   BUN 18 03/29/2017 1045   CREATININE 1.32 (H) 08/04/2021 0910   CALCIUM 9.7 08/04/2021 0910   PROT 6.4 (L) 08/04/2021 0910   PROT 7.7 03/29/2017 1045   ALBUMIN 4.2 08/04/2021 0910   ALBUMIN 5.2 03/29/2017 1045   AST 18 08/04/2021 0910   ALT 25 08/04/2021 0910   ALKPHOS 45 08/04/2021 0910   BILITOT 0.6 08/04/2021 0910   GFRNONAA >60 08/04/2021 0910   GFRAA 56 (L) 03/29/2017 1045   Lab Results  Component Value Date   WBC 7.1 08/04/2021   NEUTROABS 4.1 08/04/2021   HGB 14.0 08/04/2021   HCT 41.2 08/04/2021   MCV 86.9 08/04/2021   PLT 166 08/04/2021   Imaging:  [facility] Clinician Interpretation: I have personally reviewed the CNS images as listed.  My interpretation, in the context of the patient's clinical presentation, is stable disease  MR BRAIN W WO CONTRAST  Result Date: 08/26/2021 CLINICAL DATA:  Brain/CNS neoplasm. Assess treatment response. Status post resection of GBM. EXAM: MRI HEAD WITHOUT AND WITH CONTRAST TECHNIQUE: Multiplanar, multiecho pulse sequences of the brain and surrounding structures were obtained without and with intravenous contrast. CONTRAST:  45m GADAVIST GADOBUTROL 1 MMOL/ML IV SOLN COMPARISON:  None Available. FINDINGS: Brain: Right frontal craniotomy  again noted. A peripherally enhancing somewhat heterogeneous T2 hyperintense collection has decreased in size. The residual area of enhancement now measures 17 x 19 x 11 mm. Surrounding vasogenic edema has decreased as well. No significant mass  effect is present. No new hemorrhage is present. No new areas of enhancement are present. No acute infarct or hemorrhage is present. The ventricles are of normal size. No significant extraaxial fluid collection is present. The internal auditory canals are within normal limits. No other pathologic enhancement is present. Vascular: Flow is present in the major intracranial arteries. Skull and upper cervical spine: The craniocervical junction is normal. Upper cervical spine is within normal limits. Marrow signal is unremarkable. Sinuses/Orbits: The paranasal sinuses and mastoid air cells are clear. The globes and orbits are within normal limits. IMPRESSION: 1. Continued decrease in size of peripherally enhancing T2 hyperintense collection in the right frontal lobe with decreased surrounding vasogenic edema. While this may represent a resolving postoperative seroma, residual tumor is not excluded. Decreased extent of surrounding T2 signal change without any residual mass effect. This will serve as a new baseline for follow-up. 2. No new areas of enhancement. 3. No acute intracranial abnormality. Electronically Signed   By: San Morelle M.D.   On: 08/26/2021 11:08     Assessment/Plan High grade glioma not classifiable by WHO criteria ([place])  Focal seizures ([place])  [name] is clinically stable today, now having completed full course of IMRT and Temodar.  MRI brain demonstrates stable findings overall.  We recommended initiating treatment with cycle #1 Temozolomide 150 mg/m2, on for five days and off for twenty three days in twenty eight day cycles. The patient will have a complete blood count performed on days 21 and 28 of each cycle, and a comprehensive  metabolic panel performed on day 28 of each cycle. Labs may need to be performed more often. Zofran will prescribed for home use for nausea/vomiting.   Informed consent was obtained verbally at bedside to proceed with oral chemotherapy.  Chemotherapy should be held for the following:  ANC less than 1,000  Platelets less than 100,000  LFT or creatinine greater than 2x ULN  If clinical concerns/contraindications develop  Keppra will remain at 1075m BID for now.  He will continue with outpatient PT and OT.  We ask that WCorrin Parkerreturn to clinic in 1 months prior to cycle #2, or sooner as needed.  All questions were answered. The patient knows to call the clinic with any problems, questions or concerns. No barriers to learning were detected.  The total time spent in the encounter was 30 minutes and more than 50% was on counseling and review of test results   ZVentura Sellers MD Medical Director of Neuro-Oncology [facility] [place]07/06/23 9:09 AM

## 2021-09-02 ENCOUNTER — Encounter: Payer: Self-pay | Admitting: Internal Medicine

## 2021-09-02 NOTE — Progress Notes (Signed)
                                                                                                                                                             Patient Name: Jerry Richards MRN: [id number] DOB: 01/14/1971 Referring Physician: Gar Ponto (Profile Not Attached) Date of Service: 08/08/2021 [facility], [place]                                                        End Of Treatment Note  Diagnoses: C71.1-Malignant neoplasm of frontal lobe GLIOBLASTOMA  Intent: Palliative  Radiation Treatment Dates: 06/27/2021 through 08/08/2021 Site Technique Total Dose (Gy) Dose per Fx (Gy) Completed Fx Beam Energies  Frontal Lobe: Brain_R_front IMRT 46/46 2 23/23 6X  Frontal Lobe: Brain_Bst_R_front IMRT 14/14 2 7/7 6X   Narrative: The patient tolerated radiation therapy relatively well.   Plan: The patient will follow-up with radiation oncology in 37mo.   -----------------------------------  SEppie Gibson MD

## 2021-09-02 NOTE — Telephone Encounter (Signed)
Oral Chemotherapy Pharmacist Encounter   Spoke with pharmacist Larene Beach with [facility] to clarify cycle of temozolomide 140 mg and 180 mg capsules. Confirmed the following directions with the pharmacist "Take one 140 mg capsule and one 180 mg capsule (total daily dose 320 mg) by mouth daily for 5 days on, 23 days off, repeat every 28 days. Take at bedtime and on an empty stomach to decrease nausea/vomiting"  Prescription will be further processed by [facility] to be set up for shipment to patient. Confirmed they also received ondansetron prescription and that this will also be shipped with patient's temozolomide.  Leron Croak, PharmD, BCPS Hematology/Oncology Clinical Pharmacist [facility] [place] [phone] 09/02/2021 10:15 AM

## 2021-09-05 ENCOUNTER — Inpatient Hospital Stay: Payer: BC Managed Care – PPO

## 2021-09-07 NOTE — Telephone Encounter (Signed)
Oral Chemotherapy Pharmacist Encounter  I spoke with patient's wife, Trae Bovenzi, for overview of: Temodar (temozolomide) for the maintenance treatment of glioblastoma multiforme, planned duration 6-12 months of treatment.  Counseled on administration, dosing, side effects, monitoring, drug-food interactions, safe handling, storage, and disposal.  Patient will take Temodar '140mg'$  capsules and Temodar '180mg'$  capsules, '320mg'$  total daily dose, by mouth once daily. He will take this at bedtime and on an empty stomach to decrease nausea and vomiting.  Patient will take Temodar daily for 5 days on, 23 days off, and repeated.  If 1st cycle is well tolerated, patient's wife informed that Temodar dose may be increased to 200 mg/m2 daily for 5 days on, 23 days off, repeated every 28 days for subsequent cycles   Temodar start date: 09/08/21 PM   Patient will take Zofran '8mg'$  tablet, 1 tablet by mouth 30-60 min prior to Temodar dose to help decrease N/V. We discussed strategies to manage constipation if they occur secondary to ondansetron dosing.   Adverse effects include but are not limited to: nausea, vomiting, anorexia, GI upset, rash, drug fever, and fatigue. Rare but serious adverse effects of pneumocystis pneumonia and secondary malignancy also discussed.  PCP prophylaxis will not be initiated at this time, but may be added based on lymphocyte count in the future.  Reviewed importance of keeping a medication schedule and plan for any missed doses. No barriers to medication adherence identified.  Medication reconciliation performed and medication/allergy list updated.  Insurance authorization for Temodar has been obtained. Patient's insurance requires that Temodar be filled through [employer]. This will be delivered to patient's home on 09/08/21.  All questions answered.  Ms. Manning voiced understanding and appreciation.   Medication education handout placed in mail for patient and  patient's wife. Patient's wife knows to call the office with questions or concerns. Oral Chemotherapy Clinic phone number provided.   Leron Croak, PharmD, BCPS, [facility] Hematology/Oncology Clinical Pharmacist [facility] [place] [phone] 09/07/2021 10:49 AM

## 2021-09-08 ENCOUNTER — Other Ambulatory Visit (HOSPITAL_COMMUNITY): Payer: Self-pay

## 2021-09-09 ENCOUNTER — Telehealth: Payer: Self-pay | Admitting: Internal Medicine

## 2021-09-09 NOTE — Telephone Encounter (Signed)
Called pt to sch per 7/13 inbasket, pt stated he will be traveling and will be out of town , pt stated he will keep in contact with [facility]low and schedule when he returns, desk nurse notified

## 2021-09-09 NOTE — Progress Notes (Incomplete)
Mr. Fries presents today for follow-up after completing radiation to his right frontal lobe on 08/08/2021  Dose of Decadron, if applicable: N/A; is taking 10 mg prednisone 3x day for the next 5 days for bronchitis   Recent neurologic symptoms, if any:  Seizures: Not since initial event Headaches: Patient denies Nausea: Had one episode on Saturday with newest round of oral chemo, but denies any further concerns Dizziness/ataxia: Patient denies Difficulty with hand coordination: Patient denied  Focal numbness/weakness: Continues to have some tingling down his left leg. Reports he has only been using his cane about 90% of the time (first thing in the AM and late at night when he's fatigued). States he does 1.5 miles on the stationary bike every morning to "wake up" his left leg  Visual deficits/changes: Patient denies Confusion/Memory deficits: Patient denies, but wife reports she has noticed an increase in patient repeating himself or asking the same question several times  Additional Complaints / other details:  He and his wife are going on 6 week RV trip out [place]; leaving this Thursday  Saw medical oncologist Dr. Mickeal Skinner on 09/01/21: MRI brain demonstrates stable findings overall. We recommended initiating treatment with cycle #1 Temozolomide 150 mg/m2, on for five days and off for twenty three days in twenty eight day cycles.  The patient will have a complete blood count performed on days 21 and 28 of each cycle, and a comprehensive metabolic panel performed on day 28 of each cycle. Keppra will remain at '1000mg'$  BID for now. He will continue with outpatient PT and OT. We ask that [name] return to clinic in 1 months prior to cycle #2, or sooner as needed.

## 2021-09-12 ENCOUNTER — Encounter: Payer: Self-pay | Admitting: Radiation Oncology

## 2021-09-12 ENCOUNTER — Other Ambulatory Visit: Payer: Self-pay | Admitting: Internal Medicine

## 2021-09-12 ENCOUNTER — Telehealth: Payer: Self-pay | Admitting: *Deleted

## 2021-09-12 ENCOUNTER — Other Ambulatory Visit: Payer: Self-pay

## 2021-09-12 ENCOUNTER — Other Ambulatory Visit: Payer: Self-pay | Admitting: *Deleted

## 2021-09-12 ENCOUNTER — Ambulatory Visit
Admission: RE | Admit: 2021-09-12 | Discharge: 2021-09-12 | Disposition: A | Discharge status: Home / Self Care | Payer: BC Managed Care – PPO | Source: Ambulatory Visit | Attending: Radiation Oncology | Admitting: Radiation Oncology

## 2021-09-12 VITALS — BP 113/74 | HR 55 | Temp 98.1°F | Ht 72.0 in | Wt 212.1 lb

## 2021-09-12 DIAGNOSIS — C711 Malignant neoplasm of frontal lobe: Secondary | ICD-10-CM | POA: Diagnosis present

## 2021-09-12 DIAGNOSIS — Z7952 Long term (current) use of systemic steroids: Secondary | ICD-10-CM | POA: Diagnosis not present

## 2021-09-12 DIAGNOSIS — Z79630 Long term (current) use of alkylating agent: Secondary | ICD-10-CM | POA: Diagnosis not present

## 2021-09-12 DIAGNOSIS — Z7901 Long term (current) use of anticoagulants: Secondary | ICD-10-CM | POA: Insufficient documentation

## 2021-09-12 DIAGNOSIS — Z923 Personal history of irradiation: Secondary | ICD-10-CM | POA: Diagnosis not present

## 2021-09-12 DIAGNOSIS — Z79899 Other long term (current) drug therapy: Secondary | ICD-10-CM | POA: Diagnosis not present

## 2021-09-12 DIAGNOSIS — C719 Malignant neoplasm of brain, unspecified: Secondary | ICD-10-CM

## 2021-09-12 MED ORDER — LEVETIRACETAM 500 MG PO TABS: 1000.0000 mg | ORAL_TABLET | Freq: Two times a day (BID) | ORAL | 2 refills | Status: DC

## 2021-09-12 MED ORDER — LEVETIRACETAM 500 MG PO TABS: 1000.0000 mg | ORAL_TABLET | Freq: Two times a day (BID) | ORAL | 0 refills | Status: DC

## 2021-09-12 NOTE — Telephone Encounter (Signed)
Patients wife called to state that they are traveling out [employer] and need refill 90 days of Keppra and also wanted to make sure that provider is aware that PCP started patient on steroids X 5 days for bronchitis.

## 2021-09-12 NOTE — Progress Notes (Signed)
Radiation Oncology         [phone] ________________________________  Name: Jerry Richards MRN: [id number]  Date: 09/12/2021  DOB: January 24, 1971  Follow-Up Visit Note  Outpatient  CC: Jerry Bis, MD  Jerry Bis, MD  Diagnosis and Prior Radiotherapy:    ICD-10-CM   1. High grade glioma not classifiable by WHO criteria ([place])  C71.9     2. Glioblastoma multiforme of frontal lobe (HCC)  C71.1       CHIEF COMPLAINT: Here for follow-up and surveillance of brain cancer  Narrative:  The patient returns today for routine follow-up.  Jerry Richards presents today for follow-up after completing radiation to his right frontal lobe on 08/08/2021  Dose of Decadron, if applicable: N/A; is taking 10 mg prednisone 3x day for the next 5 days for bronchitis   Recent neurologic symptoms, if any:  Seizures: Not since initial event Headaches: Patient denies Nausea: Had one episode on Saturday with newest round of oral chemo, but denies any further concerns Dizziness/ataxia: Patient denies Difficulty with hand coordination: Patient denied  Focal numbness/weakness: Continues to have some tingling down his left leg. Reports he has only been using his cane about 90% of the time (first thing in the AM and late at night when he's fatigued). States he does 1.5 miles on the stationary bike every morning to "wake up" his left leg  Visual deficits/changes: Patient denies Confusion/Memory deficits: Patient denies, but wife reports she has noticed an increase in patient repeating himself or asking the same question several times  Additional Complaints / other details:  He and his wife are going on 6 week RV trip out [place]; leaving this Thursday  Saw medical oncologist Dr. Mickeal Skinner on 09/01/21: MRI brain demonstrates stable findings overall. We recommended initiating treatment with cycle #1 Temozolomide 150 mg/m2, on for five days and off for twenty three days in twenty eight day cycles.  The patient  will have a complete blood count performed on days 21 and 28 of each cycle, and a comprehensive metabolic panel performed on day 28 of each cycle. Keppra will remain at '1000mg'$  BID for now. He will continue with outpatient PT and OT. We ask that Jerry Richards return to clinic in 1 months prior to cycle #2, or sooner as needed.                               ALLERGIES:  is allergic to amiodarone and morphine and related.  Meds: Current Outpatient Medications  Medication Sig Dispense Refill   acetaminophen (TYLENOL) 500 MG tablet Take 1,000 mg by mouth every 6 (six) hours as needed for moderate pain.     albuterol (VENTOLIN HFA) 108 (90 Base) MCG/ACT inhaler Inhale 1 puff into the lungs in the morning, at noon, in the evening, and at bedtime.     baclofen (LIORESAL) 10 MG tablet Take 1 tablet (10 mg total) by mouth 3 (three) times daily. Please request future refills from PCP. (Patient not taking: Reported on 07/07/2021) 90 tablet 0   doxycycline (VIBRA-TABS) 100 MG tablet Take 100 mg by mouth 2 (two) times daily.     HYDROcodone-acetaminophen (NORCO/VICODIN) 5-325 MG tablet Take 1 tablet by mouth every 4 (four) hours as needed for moderate pain. (Patient not taking: Reported on 08/04/2021) 30 tablet 0   levETIRAcetam (KEPPRA) 500 MG tablet Take 2 tablets (1,000 mg total) by mouth 2 (two) times daily. [place]  tablet 0   losartan (COZAAR) 50 MG tablet Take 50 mg by mouth daily.  6   mirtazapine (REMERON) 15 MG tablet Take 15 mg by mouth at bedtime.     ondansetron (ZOFRAN) 8 MG tablet Take 1 tablet (8 mg total) by mouth 2 (two) times daily as needed (nausea and vomiting). May take 30-60 minutes prior to Temodar administration if nausea/vomiting occurs. 30 tablet 1   predniSONE (DELTASONE) 10 MG tablet Take 30 mg by mouth daily.     rivaroxaban (XARELTO) 20 MG TABS tablet Take 1 tablet (20 mg total) by mouth daily. 90 tablet 1   temozolomide (TEMODAR) 140 MG capsule Take 1 capsule (140 mg total) by mouth  daily. May take on an empty stomach to decrease nausea & vomiting. 5 capsule 0   temozolomide (TEMODAR) 180 MG capsule Take 1 capsule (180 mg total) by mouth daily. May take on an empty stomach to decrease nausea & vomiting. 5 capsule 0   No current facility-administered medications for this encounter.    Physical Findings: The patient is in no acute distress. Patient is alert and oriented.  height is 6' (1.829 m) and weight is 212 lb 2 oz (96.2 kg). His oral temperature is 98.1 F (36.7 C). His blood pressure is 113/74 and his pulse is 55 (abnormal). His oxygen saturation is 99%. .    Gen: Alert and oriented in no acute distress HEENT: Partial alopecia with no scalp erythema MSK: Ambulatory independently  KPS = 80  100 - Normal; no complaints; no evidence of disease. 90   - Able to carry on normal activity; minor signs or symptoms of disease. 80   - Normal activity with effort; some signs or symptoms of disease. 18   - Cares for self; unable to carry on normal activity or to do active work. 60   - Requires occasional assistance, but is able to care for most of his personal needs. 50   - Requires considerable assistance and frequent medical care. 63   - Disabled; requires special care and assistance. 88   - Severely disabled; hospital admission is indicated although death not imminent. 70   - Very sick; hospital admission necessary; active supportive treatment necessary. 10   - Moribund; fatal processes progressing rapidly. 0     - Dead  Karnofsky DA, Abelmann McDonald, Craver LS and Burchenal [facility] [phone]) The use of the nitrogen mustards in the palliative treatment of carcinoma: with particular reference to bronchogenic carcinoma Cancer 1 [id number]   Lab Findings: Lab Results  Component Value Date   WBC 7.1 08/04/2021   HGB 14.0 08/04/2021   HCT 41.2 08/04/2021   MCV 86.9 08/04/2021   PLT 166 08/04/2021    Radiographic Findings: MR BRAIN W WO CONTRAST  Result Date:  08/26/2021 CLINICAL DATA:  Brain/CNS neoplasm. Assess treatment response. Status post resection of GBM. EXAM: MRI HEAD WITHOUT AND WITH CONTRAST TECHNIQUE: Multiplanar, multiecho pulse sequences of the brain and surrounding structures were obtained without and with intravenous contrast. CONTRAST:  24m GADAVIST GADOBUTROL 1 MMOL/ML IV SOLN COMPARISON:  None Available. FINDINGS: Brain: Right frontal craniotomy again noted. A peripherally enhancing somewhat heterogeneous T2 hyperintense collection has decreased in size. The residual area of enhancement now measures 17 x 19 x 11 mm. Surrounding vasogenic edema has decreased as well. No significant mass effect is present. No new hemorrhage is present. No new areas of enhancement are present. No acute infarct or hemorrhage is present. The ventricles are of normal  size. No significant extraaxial fluid collection is present. The internal auditory canals are within normal limits. No other pathologic enhancement is present. Vascular: Flow is present in the major intracranial arteries. Skull and upper cervical spine: The craniocervical junction is normal. Upper cervical spine is within normal limits. Marrow signal is unremarkable. Sinuses/Orbits: The paranasal sinuses and mastoid air cells are clear. The globes and orbits are within normal limits. IMPRESSION: 1. Continued decrease in size of peripherally enhancing T2 hyperintense collection in the right frontal lobe with decreased surrounding vasogenic edema. While this may represent a resolving postoperative seroma, residual tumor is not excluded. Decreased extent of surrounding T2 signal change without any residual mass effect. This will serve as a new baseline for follow-up. 2. No new areas of enhancement. 3. No acute intracranial abnormality. Electronically Signed   By: San Morelle M.D.   On: 08/26/2021 11:08    Impression/Plan: He is healing well after radiation therapy.   He will continue to follow-up with  neuro-oncology with serial MRIs of the brain.  I will see him back on an as-needed basis.  I wish the best for his upcoming cross-country trip and advised him to get out of the car and go for short walk every 90 minutes to 2 hours to reduce risk of DVTs.  He is also on Xarelto which will be prophylactic.  On date of service, in total, I spent 25 minutes on this encounter. Patient was seen in person.  _____________________________________   Eppie Gibson, MD

## 2021-09-13 ENCOUNTER — Other Ambulatory Visit (HOSPITAL_COMMUNITY): Payer: Self-pay

## 2021-09-19 ENCOUNTER — Other Ambulatory Visit: Payer: Self-pay

## 2021-09-20 ENCOUNTER — Other Ambulatory Visit: Payer: Self-pay

## 2021-09-22 ENCOUNTER — Telehealth: Payer: Self-pay | Admitting: *Deleted

## 2021-09-26 DIAGNOSIS — Z0271 Encounter for disability determination: Secondary | ICD-10-CM

## 2021-09-27 ENCOUNTER — Other Ambulatory Visit: Payer: Self-pay

## 2021-09-27 ENCOUNTER — Other Ambulatory Visit: Payer: Self-pay | Admitting: *Deleted

## 2021-09-27 DIAGNOSIS — C719 Malignant neoplasm of brain, unspecified: Secondary | ICD-10-CM

## 2021-09-27 MED ORDER — ONDANSETRON HCL 8 MG PO TABS: 8.0000 mg | ORAL_TABLET | Freq: Two times a day (BID) | ORAL | 3 refills | Status: DC | PRN

## 2021-09-29 ENCOUNTER — Ambulatory Visit: Payer: BC Managed Care – PPO | Admitting: Internal Medicine

## 2021-09-29 ENCOUNTER — Other Ambulatory Visit: Payer: BC Managed Care – PPO

## 2021-09-29 ENCOUNTER — Telehealth: Payer: Self-pay | Admitting: *Deleted

## 2021-09-29 NOTE — Telephone Encounter (Signed)
Wife called requesting update to letter to specify that patient will not be able to return to work due to medical diagnosis of glioblastoma.  Prepared and emailed to wife.  Patient and wife are traveling across country over the next month or so and plan to have labs done in [place] at a [facility] location.  Once she has that location determined we will need to fax the order for CBC/CMP to them.  Results will be provided back to [place] so that Dr Mickeal Skinner can decide if the patient should start his second cycle of Temodar.  Wife has some dose of medication on hand already while traveling.

## 2021-09-30 ENCOUNTER — Other Ambulatory Visit: Payer: Self-pay

## 2021-10-04 ENCOUNTER — Other Ambulatory Visit: Payer: Self-pay | Admitting: *Deleted

## 2021-10-04 DIAGNOSIS — C719 Malignant neoplasm of brain, unspecified: Secondary | ICD-10-CM

## 2021-10-17 ENCOUNTER — Telehealth: Payer: Self-pay | Admitting: *Deleted

## 2021-10-17 ENCOUNTER — Other Ambulatory Visit: Payer: Self-pay | Admitting: Internal Medicine

## 2021-10-17 DIAGNOSIS — C719 Malignant neoplasm of brain, unspecified: Secondary | ICD-10-CM

## 2021-10-17 NOTE — Telephone Encounter (Signed)
Wife called with lab results from [employer] [facility] [facility] completed in [place].  Labs will be scanned into chart.  Dr Mickeal Skinner reviewed those labs and approved patient to start next cycle today 10/17/2021 or tomorrow with Cycle 2 dosing at 320 mg for 5 days on and 23 days off.  Patient already has this medication and correct dosage on hand and will take as instructed.    Patient is to return to state in approximately 2 weeks.   Plan to set up scan around 11/11/2021 and lab/md following on 11/14/2021.

## 2021-10-18 ENCOUNTER — Telehealth: Payer: Self-pay | Admitting: *Deleted

## 2021-10-18 NOTE — Telephone Encounter (Signed)
Corrected letter prepared for [place] with providers signature and dates patient was being seen.

## 2021-10-19 ENCOUNTER — Other Ambulatory Visit: Payer: Self-pay

## 2021-10-28 ENCOUNTER — Other Ambulatory Visit: Payer: Self-pay

## 2021-11-07 ENCOUNTER — Ambulatory Visit (HOSPITAL_COMMUNITY)
Admission: RE | Admit: 2021-11-07 | Discharge: 2021-11-07 | Disposition: A | Discharge status: Home / Self Care | Payer: BC Managed Care – PPO | Source: Ambulatory Visit | Attending: Internal Medicine | Admitting: Internal Medicine

## 2021-11-07 DIAGNOSIS — C719 Malignant neoplasm of brain, unspecified: Secondary | ICD-10-CM | POA: Insufficient documentation

## 2021-11-07 MED ORDER — GADOBUTROL 1 MMOL/ML IV SOLN
10.0000 mL | Freq: Once | INTRAVENOUS | Status: AC | PRN
  Administered 2021-11-07: 10 mL via INTRAVENOUS

## 2021-11-14 ENCOUNTER — Inpatient Hospital Stay: Payer: BC Managed Care – PPO | Attending: Neurosurgery

## 2021-11-14 ENCOUNTER — Inpatient Hospital Stay: Payer: BC Managed Care – PPO | Admitting: Internal Medicine

## 2021-11-14 ENCOUNTER — Other Ambulatory Visit: Payer: Self-pay

## 2021-11-14 VITALS — BP 138/82 | HR 56 | Temp 97.9°F | Resp 14 | Ht 72.0 in | Wt 209.1 lb

## 2021-11-14 DIAGNOSIS — I4891 Unspecified atrial fibrillation: Secondary | ICD-10-CM | POA: Insufficient documentation

## 2021-11-14 DIAGNOSIS — Z79630 Long term (current) use of alkylating agent: Secondary | ICD-10-CM | POA: Diagnosis not present

## 2021-11-14 DIAGNOSIS — Z885 Allergy status to narcotic agent status: Secondary | ICD-10-CM | POA: Insufficient documentation

## 2021-11-14 DIAGNOSIS — R531 Weakness: Secondary | ICD-10-CM | POA: Diagnosis not present

## 2021-11-14 DIAGNOSIS — F17210 Nicotine dependence, cigarettes, uncomplicated: Secondary | ICD-10-CM | POA: Insufficient documentation

## 2021-11-14 DIAGNOSIS — Z833 Family history of diabetes mellitus: Secondary | ICD-10-CM | POA: Insufficient documentation

## 2021-11-14 DIAGNOSIS — R569 Unspecified convulsions: Secondary | ICD-10-CM

## 2021-11-14 DIAGNOSIS — Z79899 Other long term (current) drug therapy: Secondary | ICD-10-CM | POA: Diagnosis not present

## 2021-11-14 DIAGNOSIS — Z8249 Family history of ischemic heart disease and other diseases of the circulatory system: Secondary | ICD-10-CM | POA: Diagnosis not present

## 2021-11-14 DIAGNOSIS — R112 Nausea with vomiting, unspecified: Secondary | ICD-10-CM | POA: Insufficient documentation

## 2021-11-14 DIAGNOSIS — C719 Malignant neoplasm of brain, unspecified: Secondary | ICD-10-CM

## 2021-11-14 DIAGNOSIS — Z7901 Long term (current) use of anticoagulants: Secondary | ICD-10-CM | POA: Diagnosis not present

## 2021-11-14 DIAGNOSIS — Z808 Family history of malignant neoplasm of other organs or systems: Secondary | ICD-10-CM | POA: Diagnosis not present

## 2021-11-14 DIAGNOSIS — Z9049 Acquired absence of other specified parts of digestive tract: Secondary | ICD-10-CM | POA: Insufficient documentation

## 2021-11-14 DIAGNOSIS — C711 Malignant neoplasm of frontal lobe: Secondary | ICD-10-CM | POA: Diagnosis present

## 2021-11-14 LAB — CMP (CANCER CENTER ONLY)
ALT: 14 U/L (ref 0–44)
AST: 15 U/L (ref 15–41)
Albumin: 4.5 g/dL (ref 3.5–5.0)
Alkaline Phosphatase: 49 U/L (ref 38–126)
Anion gap: 8 (ref 5–15)
BUN: 14 mg/dL (ref 6–20)
CO2: 26 mmol/L (ref 22–32)
Calcium: 9.7 mg/dL (ref 8.9–10.3)
Chloride: 106 mmol/L (ref 98–111)
Creatinine: 1.33 mg/dL — ABNORMAL HIGH (ref 0.61–1.24)
GFR, Estimated: >60 mL/min (ref >60)
Glucose, Bld: 105 mg/dL — ABNORMAL HIGH (ref 70–99)
Potassium: 4.4 mmol/L (ref 3.5–5.1)
Sodium: 140 mmol/L (ref 135–145)
Total Bilirubin: 1 mg/dL (ref 0.3–1.2)
Total Protein: 7 g/dL (ref 6.5–8.1)

## 2021-11-14 LAB — CBC WITH DIFFERENTIAL (CANCER CENTER ONLY)
Abs Immature Granulocytes: 0.03 10*3/uL (ref 0.00–0.07)
Basophils Absolute: 0.1 10*3/uL (ref 0.0–0.1)
Basophils Relative: 1 %
Eosinophils Absolute: 0.4 10*3/uL (ref 0.0–0.5)
Eosinophils Relative: 4 %
HCT: 45.3 % (ref 39.0–52.0)
Hemoglobin: 15.6 g/dL (ref 13.0–17.0)
Immature Granulocytes: 0 %
Lymphocytes Relative: 12 %
Lymphs Abs: 1.2 10*3/uL (ref 0.7–4.0)
MCH: 31 pg (ref 26.0–34.0)
MCHC: 34.4 g/dL (ref 30.0–36.0)
MCV: 89.9 fL (ref 80.0–100.0)
Monocytes Absolute: 0.6 10*3/uL (ref 0.1–1.0)
Monocytes Relative: 6 %
Neutro Abs: 7.3 10*3/uL (ref 1.7–7.7)
Neutrophils Relative %: 77 %
Platelet Count: 201 10*3/uL (ref 150–400)
RBC: 5.04 MIL/uL (ref 4.22–5.81)
RDW: 15.2 % (ref 11.5–15.5)
WBC Count: 9.5 10*3/uL (ref 4.0–10.5)
nRBC: 0 % (ref 0.0–0.2)

## 2021-11-14 MED ORDER — TEMOZOLOMIDE 140 MG PO CAPS: 200.0000 mg/m2/d | ORAL_CAPSULE | Freq: Every day | ORAL | 0 refills | Status: DC

## 2021-11-14 NOTE — Progress Notes (Signed)
[facility] [place], [place] [address] [phone]   Interval Evaluation  Date of Service: 11/14/21 Patient Name: Jerry Richards Patient MRN: [id number] Patient DOB: 06-26-1970 Provider: Ventura Sellers, MD  Identifying Statement:  Jerry Richards is a 51 y.o. male with right frontal glioblastoma   Oncologic History: Oncology History  High grade glioma not classifiable by WHO criteria [facility])  05/16/2021 Surgery   Craniotomy, R parafalcine resection with Dr. Annette Stable; path is high grade glioma IDHwt   06/20/2021 - 06/20/2021 Chemotherapy   Patient is on Treatment Plan : BRAIN GLIOBLASTOMA Radiation Therapy With Concurrent Temozolomide 75 mg/m2 Daily Followed By Sequential Maintenance Temozolomide x 6-12 cycles     09/05/2021 -  Chemotherapy   Patient is on Treatment Plan : BRAIN GLIOBLASTOMA Consolidation Temozolomide Days 1-5 q28 Days        Biomarkers:  MGMT Unknown.  IDH 1/2 Wild type.  EGFR Unknown  TERT Unknown   Interval History: Jerry Richards presents today for follow up, now having completed 2 cycles of adjuvant Temodar.  No clinical changes today.  Denies any recurrence of seizures, no headaches.  Leg symptoms are stable.  [place].  H+P (06/07/21): Patient presented to neurologic attention in March with several days of progressive left sided weakness and seizures, characterized by twitching of the left leg.  CNS imaging demonstrated a right frontal mass.  He underwent craniotomy and resection with Dr. Annette Stable on 05/16/21; path demonstrated high grade glioma IDHwt.  Following surgery his left leg was much weaker, though it has improved with aggressive rehab efforts.  Currently he is using a cane to ambulate.  He has no other complaints, continues on decadron 36m twice per day.  No seizures since surgery.  Medications: Current Outpatient Medications on File Prior to Visit  Medication Sig  Dispense Refill   acetaminophen (TYLENOL) 500 MG tablet Take 1,000 mg by mouth every 6 (six) hours as needed for moderate pain.     levETIRAcetam (KEPPRA) 500 MG tablet Take 2 tablets (1,000 mg total) by mouth 2 (two) times daily. 360 tablet 0   losartan (COZAAR) 50 MG tablet Take 50 mg by mouth daily.  6   mirtazapine (REMERON) 15 MG tablet Take 15 mg by mouth at bedtime.     ondansetron (ZOFRAN) 8 MG tablet Take 1 tablet (8 mg total) by mouth 2 (two) times daily as needed (nausea and vomiting). May take 30-60 minutes prior to Temodar administration if nausea/vomiting occurs. 30 tablet 3   rivaroxaban (XARELTO) 20 MG TABS tablet Take 1 tablet (20 mg total) by mouth daily. 90 tablet 1   albuterol (VENTOLIN HFA) 108 (90 Base) MCG/ACT inhaler Inhale 1 puff into the lungs in the morning, at noon, in the evening, and at bedtime. (Patient not taking: Reported on 11/14/2021)     No current facility-administered medications on file prior to visit.    Allergies:  Allergies  Allergen Reactions   Amiodarone     AMIODARONE ANALOGUES Hyperthyroidism   Morphine And Related Palpitations    Chest pressure/palpitations   Past Medical History:  Past Medical History:  Diagnosis Date   Anxiety    Atrial fibrillation ([place]    a. s/p RFCA x 2 (10/26/2011, 04/15/2012)   Atrial flutter (HCC)    Chronic kidney disease    Clotting disorder ([place]    Colitis    Current smoker    Diverticulitis  Finger near amputation, left    Hyperthyroidism    Secondary to amiodarone   LA thrombus    Non-ischemic cardiomyopathy (HCC)    OSA on CPAP    Sinus trouble    Sleep apnea    Systolic heart failure (HCC)    EF 20-25%-> 50-55% in 10/2011. MOST RECENT ECHO (03/2012) EF= 66%   Past Surgical History:  Past Surgical History:  Procedure Laterality Date   APPLICATION OF CRANIAL NAVIGATION Right 05/16/2021   Procedure: APPLICATION OF CRANIAL NAVIGATION;  Surgeon: Earnie Larsson, MD;  Location: [facility];  Service:  Neurosurgery;  Laterality: Right;   ATRIAL ABLATION SURGERY     CARDIAC CATHETERIZATION  08/2011   [facility]   CHOLECYSTECTOMY     CRANIOTOMY Right 05/16/2021   Procedure: Craniotomy - right - Parietal with  brainlab;  Surgeon: Earnie Larsson, MD;  Location: [facility];  Service: Neurosurgery;  Laterality: Right;   FINGER SURGERY     LEFT HEART CATHETERIZATION WITH CORONARY ANGIOGRAM N/A 09/12/2011   Procedure: LEFT HEART CATHETERIZATION WITH CORONARY ANGIOGRAM;  Surgeon: Hillary Bow, MD;  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 Occas tea   Social Determinants of Health   Financial Resource Strain: Not on file  Food Insecurity: Not on file  Transportation Needs: Not on file  Physical Activity: Not on file  Stress: Not on file  Social Connections: Not on file  Intimate Partner Violence: Not on file   Family History:  Family History  Problem Relation Age of Onset   Healthy Mother        age 39   Cancer Mother        melanoma   Atrial fibrillation Father        age 79   Cancer Father        melanoma   Atrial fibrillation Paternal Aunt    Atrial fibrillation Paternal  Uncle    Diabetes Maternal Grandmother    Coronary artery disease Paternal Grandmother    Heart disease Paternal Grandmother    Heart attack Paternal Grandmother    Coronary artery disease Paternal Grandfather    Heart disease Paternal Grandfather    Heart attack Paternal Grandfather    Colon cancer Neg Hx    Esophageal cancer Neg Hx    Ovarian cancer Neg Hx    Stomach cancer Neg Hx    Rectal cancer Neg Hx     Review of Systems: Constitutional: Doesn't report fevers,  chills or abnormal weight loss Eyes: Doesn't report blurriness of vision Ears, nose, mouth, throat, and face: Doesn't report sore throat Respiratory: Doesn't report cough, dyspnea or wheezes Cardiovascular: Doesn't report palpitation, chest discomfort  Gastrointestinal:  Doesn't report nausea, constipation, diarrhea GU: Doesn't report incontinence Skin: Doesn't report skin rashes Neurological: Per HPI Musculoskeletal: Doesn't report joint pain Behavioral/Psych: Doesn't report anxiety  Physical Exam: Vitals:   11/14/21 0950  BP: 138/82  Pulse: (!) 56  Resp: 14  Temp: 97.9 F (36.6 C)  SpO2: 100%    KPS: 80. General: Alert, cooperative, pleasant, in no acute distress Head: Normal EENT: No conjunctival injection or scleral icterus.  Lungs: Resp effort normal Cardiac: Regular rate Abdomen: Non-distended abdomen Skin: No rashes cyanosis or petechiae. Extremities: No clubbing or edema  Neurologic Exam: Mental Status: Awake, alert, attentive to examiner. Oriented to self and environment. Language is fluent with intact comprehension.  Cranial Nerves: Visual acuity is grossly normal. Visual fields are full. Extra-ocular movements intact. No ptosis. Face is symmetric Motor: Tone and bulk are normal. Power is 4/5 in left leg, 5/5 otherwise. Reflexes are symmetric, no pathologic reflexes present.  Sensory: Intact to light touch Gait: Hemiparetic, cane assisted.   Labs: I have reviewed the data as listed     Component Value Date/Time   NA 140 11/14/2021 0929   NA 140 03/29/2017 1045   K 4.4 11/14/2021 0929   CL 106 11/14/2021 0929   CO2 26 11/14/2021 0929   GLUCOSE 105 (H) 11/14/2021 0929   BUN 14 11/14/2021 0929   BUN 18 03/29/2017 1045   CREATININE 1.33 (H) 11/14/2021 0929   CALCIUM 9.7 11/14/2021 0929   PROT 7.0 11/14/2021 0929   PROT 7.7 03/29/2017 1045   ALBUMIN 4.5 11/14/2021 0929   ALBUMIN 5.2 03/29/2017 1045   AST 15 11/14/2021 0929   ALT 14 11/14/2021 0929   ALKPHOS 49 11/14/2021 0929   BILITOT 1.0 11/14/2021 0929   GFRNONAA >60 11/14/2021 0929   GFRAA 56 (L) 03/29/2017 1045   Lab Results  Component Value Date   WBC 9.5 11/14/2021   NEUTROABS 7.3 11/14/2021   HGB 15.6 11/14/2021   HCT 45.3 11/14/2021   MCV 89.9 11/14/2021   PLT 201 11/14/2021   Imaging:  [facility] Clinician Interpretation: I have personally reviewed the CNS images as listed.  My interpretation, in the context of the patient's clinical presentation, is treatment effect vs true progression  MR BRAIN W WO CONTRAST  Result Date: 11/08/2021 CLINICAL DATA:  High-grade glioma post resection and treatment. EXAM: MRI HEAD WITHOUT AND WITH CONTRAST TECHNIQUE: Multiplanar, multiecho pulse sequences of the brain and surrounding structures were obtained without and with intravenous contrast. CONTRAST:  63m GADAVIST GADOBUTROL 1 MMOL/ML IV SOLN COMPARISON:  MRI head 08/26/2021, 05/17/2021 FINDINGS: Brain: Biparietal convexity craniotomy for resection of tumor in the right medial parietal lobe. Irregular enhancement with central necrosis has progressed at the surgical site. The enhancing lesion now measures 20 by 17 mm. Surrounding white matter hyperintensity also with progression in the interval. 6 mm enhancing lesion in the right parietal white matter is new. Mild white matter hyperintensities seen in this area previously but no enhancement. 3 mm enhancing lesion in the right posteromedial temporal lobe is new. See axial  image 87 series 16 Cortical edema in the right anterior temporal lobe without enhancement. No restricted diffusion. This has been present on the prior studies and appears stable. Ventricle size normal. No midline shift. No acute infarct. Mild chronic blood  products at the resection site in the right medial parietal lobe unchanged. Vascular: Normal arterial flow voids. Enhancing vessels in the left frontal lobe consistent with developmental venous anomaly, stable Skull and upper cervical spine: Biparietal convexity craniotomy. Sinuses/Orbits: Mild mucosal edema paranasal sinuses. Negative orbit Other: None IMPRESSION: 1. Progression of enhancing lesion and surrounding edema in the right medial parietal lobe the site of prior resection. Probable tumor growth. 2. 6 mm enhancing lesion in the right parietal white matter is new and likely due to non contiguous tumor spread. 3 mm new lesion in the right medial temporal lobe posteriorly. 3. Cortical edema in the right anterior temporal lobe without abnormal enhancement. This has progressed since the prior MRI of 05/09/2021 and from the MRI of 08/26/2021. This is not likely related to glioma spread. Differential includes encephalitis including viral and herpes encephalitis. Limbic encephalitis also possible. Also consider nonenhancing low-grade glioma. Electronically Signed   By: Franchot Gallo M.D.   On: 11/08/2021 14:07     Assessment/Plan High grade glioma not classifiable by WHO criteria ([place]) - Plan: temozolomide (TEMODAR) 140 MG capsule, MR BRAIN W WO CONTRAST  Focal seizures ([place])  Jerry Richards is clinically stable today, now having completed 2 cycles of adjuvant Temodar.  MRI brain demonstrate localized changes within R frontal lobe consistent with pseudoprogression (favored) or organic progression of disease.  Two novel foci of enhancement appear to lie within high dose radiation field; we will continue to monitor with imaging.  The region of right  temporal FLAIR signal abnormality is slightly increased from prior; etiology remains distal focus of gliomatosis vs non-neoplastic process.    We will have radiation team run a fusion with RT treatment plan and current MRI; hopefully will confirm localization of enhancement within high dose field.  We recommended continuing treatment with cycle #3 Temozolomide 232m/m2, on for five days and off for twenty three days in twenty eight day cycles. The patient will have a complete blood count performed on days 21 and 28 of each cycle, and a comprehensive metabolic panel performed on day 28 of each cycle. Labs may need to be performed more often. Zofran will prescribed for home use for nausea/vomiting.   Chemotherapy should be held for the following:  ANC less than 1,000  Platelets less than 100,000  LFT or creatinine greater than 2x ULN  If clinical concerns/contraindications develop  Keppra will remain at 100102mBID for now.  He will continue with outpatient PT and OT.  We ask that WaCorrin Parkereturn to clinic in 1 months with MRI brain for evaluation prior to cycle #3, or sooner as needed.  All questions were answered. The patient knows to call the clinic with any problems, questions or concerns. No barriers to learning were detected.  The total time spent in the encounter was 40 minutes and more than 50% was on counseling and review of test results   ZaVentura SellersMD Medical Director of Neuro-Oncology [facility] We[facility] Forest9/18/23 10:55 AM

## 2021-11-16 ENCOUNTER — Other Ambulatory Visit: Payer: Self-pay

## 2021-11-22 ENCOUNTER — Other Ambulatory Visit: Payer: Self-pay

## 2021-11-24 ENCOUNTER — Telehealth: Payer: Self-pay | Admitting: *Deleted

## 2021-11-24 NOTE — Telephone Encounter (Signed)
Faxed completed request from [facility] to include notes from 06/08/2021 to present.  Release permission also received.    Sent to be scanned.

## 2021-12-02 ENCOUNTER — Other Ambulatory Visit: Payer: Self-pay | Admitting: Internal Medicine

## 2021-12-02 DIAGNOSIS — C719 Malignant neoplasm of brain, unspecified: Secondary | ICD-10-CM

## 2021-12-05 NOTE — Telephone Encounter (Signed)
Patient will have labs and see provider before refilling oral chemotherapy.

## 2021-12-09 ENCOUNTER — Encounter: Payer: Self-pay | Admitting: Internal Medicine

## 2021-12-10 ENCOUNTER — Other Ambulatory Visit: Payer: Self-pay | Admitting: Internal Medicine

## 2021-12-12 ENCOUNTER — Other Ambulatory Visit: Payer: Self-pay | Admitting: Radiation Therapy

## 2021-12-12 ENCOUNTER — Encounter: Payer: Self-pay | Admitting: Internal Medicine

## 2021-12-13 ENCOUNTER — Encounter: Payer: Self-pay | Admitting: Internal Medicine

## 2021-12-14 ENCOUNTER — Other Ambulatory Visit: Payer: Self-pay

## 2021-12-15 ENCOUNTER — Encounter: Payer: Self-pay | Admitting: Internal Medicine

## 2021-12-16 ENCOUNTER — Ambulatory Visit (HOSPITAL_COMMUNITY)
Admission: RE | Admit: 2021-12-16 | Discharge: 2021-12-16 | Disposition: A | Discharge status: Home / Self Care | Payer: 59 | Source: Ambulatory Visit | Attending: Internal Medicine | Admitting: Internal Medicine

## 2021-12-16 ENCOUNTER — Encounter: Payer: Self-pay | Admitting: Genetic Counselor

## 2021-12-16 DIAGNOSIS — C719 Malignant neoplasm of brain, unspecified: Secondary | ICD-10-CM | POA: Diagnosis not present

## 2021-12-16 DIAGNOSIS — G9389 Other specified disorders of brain: Secondary | ICD-10-CM | POA: Diagnosis not present

## 2021-12-16 MED ORDER — GADOBUTROL 1 MMOL/ML IV SOLN
10.0000 mL | Freq: Once | INTRAVENOUS | Status: AC | PRN
  Administered 2021-12-16: 10 mL via INTRAVENOUS

## 2021-12-19 ENCOUNTER — Inpatient Hospital Stay (HOSPITAL_BASED_OUTPATIENT_CLINIC_OR_DEPARTMENT_OTHER): Payer: 59 | Admitting: Internal Medicine

## 2021-12-19 ENCOUNTER — Inpatient Hospital Stay: Payer: 59

## 2021-12-19 ENCOUNTER — Telehealth: Payer: Self-pay | Admitting: Internal Medicine

## 2021-12-19 ENCOUNTER — Telehealth: Payer: Self-pay | Admitting: Pharmacist

## 2021-12-19 ENCOUNTER — Inpatient Hospital Stay: Payer: 59 | Attending: Neurosurgery

## 2021-12-19 ENCOUNTER — Other Ambulatory Visit: Payer: Self-pay | Admitting: *Deleted

## 2021-12-19 ENCOUNTER — Encounter: Payer: Self-pay | Admitting: Internal Medicine

## 2021-12-19 ENCOUNTER — Other Ambulatory Visit (HOSPITAL_COMMUNITY): Payer: Self-pay

## 2021-12-19 VITALS — BP 116/75 | HR 58 | Temp 97.8°F | Resp 17 | Ht 72.0 in | Wt 208.5 lb

## 2021-12-19 DIAGNOSIS — C711 Malignant neoplasm of frontal lobe: Secondary | ICD-10-CM

## 2021-12-19 DIAGNOSIS — Z79899 Other long term (current) drug therapy: Secondary | ICD-10-CM | POA: Diagnosis not present

## 2021-12-19 DIAGNOSIS — Z7901 Long term (current) use of anticoagulants: Secondary | ICD-10-CM | POA: Diagnosis not present

## 2021-12-19 DIAGNOSIS — R531 Weakness: Secondary | ICD-10-CM | POA: Insufficient documentation

## 2021-12-19 DIAGNOSIS — C719 Malignant neoplasm of brain, unspecified: Secondary | ICD-10-CM

## 2021-12-19 DIAGNOSIS — F17210 Nicotine dependence, cigarettes, uncomplicated: Secondary | ICD-10-CM | POA: Diagnosis not present

## 2021-12-19 DIAGNOSIS — Q283 Other malformations of cerebral vessels: Secondary | ICD-10-CM | POA: Insufficient documentation

## 2021-12-19 DIAGNOSIS — Z885 Allergy status to narcotic agent status: Secondary | ICD-10-CM | POA: Insufficient documentation

## 2021-12-19 DIAGNOSIS — Z79630 Long term (current) use of alkylating agent: Secondary | ICD-10-CM | POA: Diagnosis not present

## 2021-12-19 DIAGNOSIS — Z9049 Acquired absence of other specified parts of digestive tract: Secondary | ICD-10-CM | POA: Diagnosis not present

## 2021-12-19 DIAGNOSIS — M79605 Pain in left leg: Secondary | ICD-10-CM | POA: Insufficient documentation

## 2021-12-19 DIAGNOSIS — R69 Illness, unspecified: Secondary | ICD-10-CM | POA: Diagnosis not present

## 2021-12-19 DIAGNOSIS — R569 Unspecified convulsions: Secondary | ICD-10-CM | POA: Insufficient documentation

## 2021-12-19 LAB — CBC WITH DIFFERENTIAL (CANCER CENTER ONLY)
Abs Immature Granulocytes: 0.02 10*3/uL (ref 0.00–0.07)
Basophils Absolute: 0 10*3/uL (ref 0.0–0.1)
Basophils Relative: 1 %
Eosinophils Absolute: 0.5 10*3/uL (ref 0.0–0.5)
Eosinophils Relative: 7 %
HCT: 44.6 % (ref 39.0–52.0)
Hemoglobin: 15.6 g/dL (ref 13.0–17.0)
Immature Granulocytes: 0 %
Lymphocytes Relative: 17 %
Lymphs Abs: 1.1 10*3/uL (ref 0.7–4.0)
MCH: 32 pg (ref 26.0–34.0)
MCHC: 35 g/dL (ref 30.0–36.0)
MCV: 91.4 fL (ref 80.0–100.0)
Monocytes Absolute: 0.5 10*3/uL (ref 0.1–1.0)
Monocytes Relative: 7 %
Neutro Abs: 4.5 10*3/uL (ref 1.7–7.7)
Neutrophils Relative %: 68 %
Platelet Count: 107 10*3/uL — ABNORMAL LOW (ref 150–400)
RBC: 4.88 MIL/uL (ref 4.22–5.81)
RDW: 14.4 % (ref 11.5–15.5)
WBC Count: 6.6 10*3/uL (ref 4.0–10.5)
nRBC: 0 % (ref 0.0–0.2)

## 2021-12-19 LAB — CMP (CANCER CENTER ONLY)
ALT: 14 U/L (ref 0–44)
AST: 14 U/L — ABNORMAL LOW (ref 15–41)
Albumin: 4.5 g/dL (ref 3.5–5.0)
Alkaline Phosphatase: 55 U/L (ref 38–126)
Anion gap: 5 (ref 5–15)
BUN: 19 mg/dL (ref 6–20)
CO2: 28 mmol/L (ref 22–32)
Calcium: 9.8 mg/dL (ref 8.9–10.3)
Chloride: 106 mmol/L (ref 98–111)
Creatinine: 1.41 mg/dL — ABNORMAL HIGH (ref 0.61–1.24)
GFR, Estimated: 60 mL/min — ABNORMAL LOW (ref >60)
Glucose, Bld: 79 mg/dL (ref 70–99)
Potassium: 4.3 mmol/L (ref 3.5–5.1)
Sodium: 139 mmol/L (ref 135–145)
Total Bilirubin: 0.8 mg/dL (ref 0.3–1.2)
Total Protein: 7.1 g/dL (ref 6.5–8.1)

## 2021-12-19 MED ORDER — ONDANSETRON HCL 8 MG PO TABS
8.0000 mg | ORAL_TABLET | Freq: Three times a day (TID) | ORAL | 1 refills | Status: DC | PRN
  Filled 2021-12-19: qty 30, 10d supply, fill #0

## 2021-12-19 MED ORDER — LOMUSTINE 100 MG PO CAPS
200.0000 mg | ORAL_CAPSULE | Freq: Once | ORAL | 0 refills | Status: DC
  Filled 2021-12-19: qty 2, 1d supply, fill #0

## 2021-12-19 MED ORDER — LOMUSTINE 100 MG PO CAPS: 200.0000 mg | ORAL_CAPSULE | Freq: Once | ORAL | 0 refills | Status: AC

## 2021-12-19 NOTE — Telephone Encounter (Signed)
Per workque called and left message for pt about appointment

## 2021-12-19 NOTE — Progress Notes (Signed)
[facility] [place], [place] [address] [phone]   Interval Evaluation  Date of Service: 12/19/21 Patient Name: Jerry Richards Patient MRN: [id number] Patient DOB: [birth year] Provider: Ventura Sellers, MD  Identifying Statement:  Jerry Richards is a 51 y.o. male with right frontal glioblastoma   Oncologic History: Oncology History  High grade glioma not classifiable by WHO criteria [facility])  05/16/2021 Surgery   Craniotomy, R parafalcine resection with Dr. Annette Stable; path is high grade glioma IDHwt   06/20/2021 - 06/20/2021 Chemotherapy   Patient is on Treatment Plan : BRAIN GLIOBLASTOMA Radiation Therapy With Concurrent Temozolomide 75 mg/m2 Daily Followed By Sequential Maintenance Temozolomide x 6-12 cycles     09/05/2021 -  Chemotherapy   Patient is on Treatment Plan : BRAIN GLIOBLASTOMA Consolidation Temozolomide Days 1-5 q28 Days        Biomarkers:  MGMT Unknown.  IDH 1/2 Wild type.  EGFR Unknown  TERT Unknown   Interval History: Jerry Richards presents today for follow up after recent MRI brain, now having completed 3 cycles of adjuvant Temodar.  No clinical changes today.  Denies any recurrence of seizures, no headaches.  Leg symptoms are stable.  Continues to have some pain in the left leg as prior.  H+P (06/07/21): Patient presented to neurologic attention in March with several days of progressive left sided weakness and seizures, characterized by twitching of the left leg.  CNS imaging demonstrated a right frontal mass.  He underwent craniotomy and resection with Dr. Annette Stable on 05/16/21; path demonstrated high grade glioma IDHwt.  Following surgery his left leg was much weaker, though it has improved with aggressive rehab efforts.  Currently he is using a cane to ambulate.  He has no other complaints, continues on decadron 13m twice per day.  No seizures since surgery.  Medications: Current Outpatient Medications on File  Prior to Visit  Medication Sig Dispense Refill   acetaminophen (TYLENOL) 500 MG tablet Take 1,000 mg by mouth every 6 (six) hours as needed for moderate pain.     albuterol (VENTOLIN HFA) 108 (90 Base) MCG/ACT inhaler Inhale 1 puff into the lungs in the morning, at noon, in the evening, and at bedtime. (Patient not taking: Reported on 11/14/2021)     levETIRAcetam (KEPPRA) 500 MG tablet TAKE 2 TABLETS(1000 MG) BY MOUTH TWICE DAILY 360 tablet 0   losartan (COZAAR) 50 MG tablet Take 50 mg by mouth daily.  6   mirtazapine (REMERON) 15 MG tablet Take 15 mg by mouth at bedtime.     ondansetron (ZOFRAN) 8 MG tablet Take 1 tablet (8 mg total) by mouth 2 (two) times daily as needed (nausea and vomiting). May take 30-60 minutes prior to Temodar administration if nausea/vomiting occurs. 30 tablet 3   rivaroxaban (XARELTO) 20 MG TABS tablet Take 1 tablet (20 mg total) by mouth daily. 90 tablet 1   temozolomide (TEMODAR) 140 MG capsule Take 3 capsules (420 mg total) by mouth daily. May take on an empty stomach to decrease nausea & vomiting. 15 capsule 0   No current facility-administered medications on file prior to visit.    Allergies:  Allergies  Allergen Reactions   Amiodarone     AMIODARONE ANALOGUES Hyperthyroidism   Morphine And Related Palpitations    Chest pressure/palpitations   Past Medical History:  Past Medical History:  Diagnosis Date   Anxiety    Atrial fibrillation ([facility]    a. 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 MOST RECENT ECHO (03/2012) EF= 66%   Past Surgical History:  Past Surgical History:  Procedure  Laterality Date   APPLICATION OF CRANIAL NAVIGATION Right 05/16/2021   Procedure: APPLICATION OF CRANIAL NAVIGATION;  Surgeon: Earnie Larsson, MD;  Location: [place];  Service: Neurosurgery;  Laterality: Right;   ATRIAL ABLATION SURGERY     CARDIAC CATHETERIZATION  08/2011   [facility]   CHOLECYSTECTOMY     CRANIOTOMY Right 05/16/2021   Procedure: Craniotomy - right - Parietal with  brainlab;  Surgeon: Earnie Larsson, MD;  Location: [place];  Service: Neurosurgery;  Laterality: Right;   FINGER SURGERY     LEFT HEART CATHETERIZATION WITH CORONARY ANGIOGRAM N/A 09/12/2011   Procedure: LEFT HEART CATHETERIZATION WITH CORONARY ANGIOGRAM;  Surgeon: Hillary Bow, MD;  Location[facility] CATH LAB;  Service: Cardiovascular;  Laterality: N/A;   VASECTOMY     Social History:  Social History   Socioeconomic History   Marital status: Married    Spouse name: tanya   Number of children: 2   Years of education: Not on file   Highest education level: Some college, no degree  Occupational History   Occupation: SELF EMPLOYED    Comment: Nurse, 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 Occas tea   Social Determinants of Health   Financial Resource Strain: Not on file  Food Insecurity: Not on file  Transportation Needs: Not on file  Physical Activity: Not on file  Stress: Not on file  Social Connections: Not on file  Intimate Partner Violence: Not on file   Family History:  Family History  Problem Relation Age of Onset   Healthy Mother        age 57    Melanoma Mother    Melanoma Father    Atrial fibrillation Father        age 9   Diabetes Maternal Grandmother    Coronary artery disease Paternal Grandmother    Heart disease Paternal Grandmother    Heart attack Paternal Grandmother    Coronary artery disease Paternal Grandfather    Heart disease Paternal Grandfather    Heart attack Paternal Grandfather    Atrial fibrillation Paternal Aunt    Atrial fibrillation Paternal Uncle    Colon cancer Neg Hx    Esophageal cancer Neg Hx    Ovarian cancer Neg Hx  Stomach cancer Neg Hx    Rectal cancer Neg Hx     Review of Systems: Constitutional: Doesn't report fevers, chills or abnormal weight loss Eyes: Doesn't report blurriness of vision Ears, nose, mouth, throat, and face: Doesn't report sore throat Respiratory: Doesn't report cough, dyspnea or wheezes Cardiovascular: Doesn't report palpitation, chest discomfort  Gastrointestinal:  Doesn't report nausea, constipation, diarrhea GU: Doesn't report incontinence Skin: Doesn't report skin rashes Neurological: Per HPI Musculoskeletal: Doesn't report joint pain Behavioral/Psych: Doesn't report anxiety  Physical Exam: Vitals:   12/19/21 0908  BP: 116/75  Pulse: (!) 58  Resp: 17  Temp: 97.8 F (36.6 C)  SpO2: 98%   KPS: 80. General: Alert, cooperative, pleasant, in no acute distress Head: Normal EENT: No conjunctival injection or scleral icterus.  Lungs: Resp effort normal Cardiac: Regular rate Abdomen: Non-distended abdomen Skin: No rashes cyanosis or petechiae. Extremities: No clubbing or edema  Neurologic Exam: Mental Status: Awake, alert, attentive to examiner. Oriented to self and environment. Language is fluent with intact comprehension.  Cranial Nerves: Visual acuity is grossly normal. Visual fields are full. Extra-ocular movements intact. No ptosis. Face is symmetric Motor: Tone and bulk are normal. Power is 4/5 in left leg, 5/5 otherwise. Reflexes are symmetric, no  pathologic reflexes present.  Sensory: Intact to light touch Gait: Hemiparetic, cane assisted.   Labs: I have reviewed the data as listed    Component Value Date/Time   NA 140 11/14/2021 0929   NA 140 03/29/2017 1045   K 4.4 11/14/2021 0929   CL 106 11/14/2021 0929   CO2 26 11/14/2021 0929   GLUCOSE 105 (H) 11/14/2021 0929   BUN 14 11/14/2021 0929   BUN 18 03/29/2017 1045   CREATININE 1.33 (H) 11/14/2021 0929   CALCIUM 9.7 11/14/2021 0929   PROT 7.0 11/14/2021 0929   PROT 7.7 03/29/2017 1045   ALBUMIN 4.5 11/14/2021 0929   ALBUMIN 5.2 03/29/2017 1045   AST 15 11/14/2021 0929   ALT 14 11/14/2021 0929   ALKPHOS 49 11/14/2021 0929   BILITOT 1.0 11/14/2021 0929   GFRNONAA >60 11/14/2021 0929   GFRAA 56 (L) 03/29/2017 1045   Lab Results  Component Value Date   WBC 9.5 11/14/2021   NEUTROABS 7.3 11/14/2021   HGB 15.6 11/14/2021   HCT 45.3 11/14/2021   MCV 89.9 11/14/2021   PLT 201 11/14/2021   Imaging:  [place] Clinician Interpretation: I have personally reviewed the CNS images as listed.  My interpretation, in the context of the patient's clinical presentation, is progressive disease  MR BRAIN W WO CONTRAST  Result Date: 12/18/2021 CLINICAL DATA:  High-grade glioma EXAM: MRI HEAD WITHOUT AND WITH CONTRAST TECHNIQUE: Multiplanar, multiecho pulse sequences of the brain and surrounding structures were obtained without and with intravenous contrast. CONTRAST:  92m GADAVIST GADOBUTROL 1 MMOL/ML IV SOLN COMPARISON:  11/07/2021 FINDINGS: Brain: 3 areas of infiltrative signal abnormality in the right cerebral hemisphere, seemingly separate but presumed microscopic continuity. Beneath the high right frontal craniotomy site is a necrotic enhancing lesion which shows mild increase, especially along the posterior extent, now up to 2.4 cm in maximal span as compared to 2.2 cm previously. A new small adjacent focus of nodular enhancement is seen anteriorly. Mildly increased extent of  regional T2 hyperintensity. Significant increase in T2 signal in the right periatrial white matter with 2 areas of heterogeneous enhancement, the larger measuring 16 mm, enhancement also progressed. Increased diffusion hyperintensity at the level of enhancement. Similar appearance of infiltrative T2 hyperintensity in  the anterior right temporal lobe, no enhancement at this level. Developmental venous anomaly in the superior left frontal lobe. No infarct, hydrocephalus, or worrisome mass effect. Vascular: Major flow voids and vascular enhancements are preserved Skull and upper cervical spine: Normal marrow signal Sinuses/Orbits: Negative IMPRESSION: 1. Three areas of T2 hyperintensity in the right cerebral hemisphere compatible with multifocal glioma. 2. At the vertex, mild size progression of the enhancing lesion and increased T2 hyperintensity. 3. In the right periatrial white matter significant increase in T2 signal with 2 areas of necrotic pattern enhancement, consistent with progressive tumor. 4. Infiltrative appearance at the right anterior temporal lobe is nonenhancing and appears stable. Electronically Signed   By: Jorje Guild M.D.   On: 12/18/2021 11:40     Assessment/Plan High grade glioma not classifiable by WHO criteria ([place])  Focal seizures ([place])  Jerry Richards is clinically stable today, now having completed 3 cycles of adjuvant Temodar.  MRI brain demonstrates nodular progressive changes within right frontal resection/RT site, as well as considerable increase in enhancement at adjacent with within deep white matter.  These changes are most consistent with progression of high grade glioma.    We recommended discontinuing treatment with Temozolomide due to disease progression.  We reviewed options for treatment moving forward, including clinical trial referral, salvage chemotherapy.    He would like to proceed with second line therapy; we will recommend CCNU 76m/m2 PO q6 weeks, in  addition to avastin 18mkg IV q2 weeks.  Avastin will be utilized because of high growth rate and tumor location near eloquent motor structures.  We reviewed side effects of CCNU, including nausea/vomiting, cytopenias.  Effects of avastin could include hypertension, bleedin/clotting, wound healing impairment.  We will check blood counts every 2 weeks concurrent with avastin pre-treat visits.     Chemotherapy should be held for the following:  ANC less than 1,000  Platelets less than 100,000  LFT or creatinine greater than 2x ULN  If clinical concerns/contraindications develop  Avastin should be held for the following:  ANC less than 500  Platelets less than 50,000  LFT or creatinine greater than 2x ULN  If clinical concerns/contraindications develop  Keppra will remain at 100086mID for now.  We will have social work reach out given desire for ongoing goals of care, counseling, and coping.   We ask that Jerry Parkerturn to clinic once scheduled for initial avastin infusion.  All questions were answered. The patient knows to call the clinic with any problems, questions or concerns. No barriers to learning were detected.  The total time spent in the encounter was 40 minutes and more than 50% was on counseling and review of test results   ZacVentura SellersD Medical Director of Neuro-Oncology [facility] WesSantiago/23/23 9:06 AM

## 2021-12-19 NOTE — Progress Notes (Signed)
DISCONTINUE ON PATHWAY REGIMEN - Neuro     A cycle is every 28 days:     Temozolomide      Temozolomide   **Always confirm dose/schedule in your pharmacy ordering system**  REASON: Disease Progression PRIOR TREATMENT: [id number]: Temozolomide 150/200 mg/m2 PO D1-5 q28 Days x up to 12 Cycles TREATMENT RESPONSE: Progressive Disease (PD)  START ON PATHWAY REGIMEN - Neuro     A cycle is every 42 days:     Lomustine   **Always confirm dose/schedule in your pharmacy ordering system**  Patient Characteristics: Glioma, Glioblastoma, IDH-wildtype, Recurrent or Progressive, Nonsurgical Candidate, Systemic Therapy Candidate, BRAF V600E Mutation Negative/Unknown and NTRK Fusion Negative/Unknown Disease Classification: Glioma Disease Classification: Glioblastoma, IDH-wildtype Disease Status: Recurrent or Progressive Treatment Classification: Nonsurgical Candidate Treatment (Nonsurgical/Adjuvant): Systemic Therapy Candidate NTRK Gene Fusion Status: Negative BRAF V600E Mutation Status: Negative Intent of Therapy: Non-Curative / Palliative Intent, Discussed with Patient

## 2021-12-19 NOTE — Telephone Encounter (Addendum)
Oral Oncology Pharmacist Encounter  Received new prescription for Gleostine (lomustine) for the treatment of glioblastoma  in conjunction with bevaciuzumab, planned duration until disease progression or unacceptable drug toxicity.  CBC w/ Diff and CMP from 12/19/21 assessed, noted pt with Scr of 1.41 mg/dL (CrCl ~81 mL/min) - no baseline dose adjustments required. Pt with pltc of 107 K/uL. Prescription dose and frequency assessed for appropriateness.   Current medication list in Epic reviewed, no relevant/significant DDIs with Gleostine identified.  Evaluated chart and no patient barriers to medication adherence noted.   Patient agreement for treatment documented in MD note on 12/19/21.  Patient's insurance requires that [place] be filled through Therapist, nutritional. Prescription redirected for dispensing.   Oral Oncology Clinic will continue to follow for insurance authorization, copayment issues, initial counseling and start date.  Leron Croak, PharmD, BCPS, BCOP Hematology/Oncology Clinical Pharmacist [facility] [place] [phone] 12/19/2021 11:49 AM

## 2021-12-20 ENCOUNTER — Inpatient Hospital Stay: Payer: 59 | Admitting: Licensed Clinical Social Worker

## 2021-12-20 ENCOUNTER — Telehealth: Payer: Self-pay | Admitting: Internal Medicine

## 2021-12-20 ENCOUNTER — Other Ambulatory Visit: Payer: Self-pay | Admitting: *Deleted

## 2021-12-20 ENCOUNTER — Telehealth: Payer: Self-pay | Admitting: Pharmacy Technician

## 2021-12-20 ENCOUNTER — Other Ambulatory Visit: Payer: Self-pay

## 2021-12-20 ENCOUNTER — Other Ambulatory Visit: Payer: Self-pay | Admitting: Internal Medicine

## 2021-12-20 DIAGNOSIS — N02B9 Other recurrent and persistent immunoglobulin A nephropathy: Secondary | ICD-10-CM | POA: Diagnosis not present

## 2021-12-20 DIAGNOSIS — I482 Chronic atrial fibrillation, unspecified: Secondary | ICD-10-CM | POA: Diagnosis not present

## 2021-12-20 DIAGNOSIS — C719 Malignant neoplasm of brain, unspecified: Secondary | ICD-10-CM | POA: Diagnosis not present

## 2021-12-20 DIAGNOSIS — N1831 Chronic kidney disease, stage 3a: Secondary | ICD-10-CM | POA: Diagnosis not present

## 2021-12-20 DIAGNOSIS — I10 Essential (primary) hypertension: Secondary | ICD-10-CM | POA: Diagnosis not present

## 2021-12-20 MED ORDER — ONDANSETRON HCL 8 MG PO TABS: 8.0000 mg | ORAL_TABLET | Freq: Three times a day (TID) | ORAL | 1 refills | Status: DC | PRN

## 2021-12-20 NOTE — Telephone Encounter (Signed)
Just ordered and wont be reordered until 6 weeks from now.

## 2021-12-20 NOTE — Progress Notes (Signed)
[facility] CSW Progress Note  Clinical Education officer, museum contacted patient by phone to offer support per the request of Dr. Mickeal Skinner.  Patient was very open to speaking on the phone and felt his wife would also benefit from this.  He said she was currently at work and he was in the middle of an activity and could not talk for long today.  He agreed to [employer] communication to set up a day/time to speak again.    Jerry Pickle Misao Fackrell, LCSW

## 2021-12-20 NOTE — Telephone Encounter (Signed)
Oral Chemotherapy Pharmacist Encounter  I spoke with patient for overview of: Gleostine (lomustine) for the treatment of glioblastoma  in conjunction with bevaciuzumab, planned duration until disease progression or unacceptable drug toxicity.  Counseled patient on administration, dosing, side effects, monitoring, drug-food interactions, safe handling, storage, and disposal.  Patient will take lomustine 121m capsules, 2 capsule (2063m by mouth once daily, may take at bedtime and on an empty stomach to decrease nausea and vomiting.  Lomustine will be administered once every 6 weeks.  Lomustine start date: 12/29/21    Adverse effects include but are not limited to: nausea, vomiting, stomatitis, decreased blood counts, hepatotoxicity, and pulmonary toxicity. Nausea: Patient will take Zofran 9m76mablet, 1 tablet by mouth 30-60 min prior to lomustine dose to help decrease N/V. Prescription redirected to [facility] at patient's request.  Reviewed with patient importance of keeping a medication schedule and plan for any missed doses. No barriers to medication adherence identified.  Medication reconciliation performed and medication/allergy list updated.  [facility] on 12/21/21, patient knows to wait until 12/29/21 to take first dose of Gleostine.   All questions answered.  Mr. [name] understanding and appreciation.   Medication education handout placed in mail for patient. Patient knows to call the office with questions or concerns. Oral Chemotherapy Clinic phone number provided to patient.   RebLeron CroakharmD, BCPS, BCOP Hematology/Oncology Clinical Pharmacist [facility] [phone]/24/2023 2:24 PM

## 2021-12-20 NOTE — Telephone Encounter (Signed)
Per  workque called and spoke to pt about appointment

## 2021-12-20 NOTE — Telephone Encounter (Signed)
Oral Oncology Patient Advocate Encounter  After completing a benefits investigation, prior authorization for Gleostine is not required at this time through [facility].  Patient's copay is $0.  [facility]   [name], CPhT-Adv Oncology Pharmacy Patient [place] Direct Number: [phone]  Fax: [phone]

## 2021-12-20 NOTE — Telephone Encounter (Signed)
Oral Chemotherapy Pharmacist Encounter  Called and confirmed with [facility] that [place] (lomustine) is ready to be set up for shipment. Patient has $0 copay.   Called and notified Mr. Advani of the above. Provided patient with phone number (tele: [phone]) to set up shipment of medication. Will call patient back in afternoon time for counseling as he stated this is a better time of day for discussing the medication.   Leron Croak, PharmD, BCPS, BCOP Hematology/Oncology Clinical Pharmacist [facility] [place] [phone] 12/20/2021 10:20 AM

## 2021-12-21 ENCOUNTER — Other Ambulatory Visit: Payer: Self-pay

## 2021-12-21 DIAGNOSIS — Z13220 Encounter for screening for lipoid disorders: Secondary | ICD-10-CM | POA: Diagnosis not present

## 2021-12-21 DIAGNOSIS — N183 Chronic kidney disease, stage 3 unspecified: Secondary | ICD-10-CM | POA: Diagnosis not present

## 2021-12-22 ENCOUNTER — Ambulatory Visit: Payer: 59 | Admitting: Internal Medicine

## 2021-12-22 ENCOUNTER — Other Ambulatory Visit (HOSPITAL_COMMUNITY): Payer: Self-pay

## 2021-12-22 ENCOUNTER — Other Ambulatory Visit: Payer: 59

## 2021-12-22 ENCOUNTER — Ambulatory Visit: Payer: 59

## 2021-12-23 ENCOUNTER — Other Ambulatory Visit (HOSPITAL_COMMUNITY): Payer: Self-pay

## 2021-12-27 DIAGNOSIS — E7849 Other hyperlipidemia: Secondary | ICD-10-CM | POA: Diagnosis not present

## 2021-12-27 DIAGNOSIS — I4891 Unspecified atrial fibrillation: Secondary | ICD-10-CM | POA: Diagnosis not present

## 2021-12-27 DIAGNOSIS — Z6827 Body mass index (BMI) 27.0-27.9, adult: Secondary | ICD-10-CM | POA: Diagnosis not present

## 2021-12-27 DIAGNOSIS — C719 Malignant neoplasm of brain, unspecified: Secondary | ICD-10-CM | POA: Diagnosis not present

## 2021-12-27 DIAGNOSIS — I10 Essential (primary) hypertension: Secondary | ICD-10-CM | POA: Diagnosis not present

## 2021-12-27 DIAGNOSIS — M21372 Foot drop, left foot: Secondary | ICD-10-CM | POA: Diagnosis not present

## 2021-12-29 ENCOUNTER — Inpatient Hospital Stay: Payer: 59

## 2021-12-29 ENCOUNTER — Inpatient Hospital Stay: Payer: 59 | Attending: Neurosurgery

## 2021-12-29 ENCOUNTER — Inpatient Hospital Stay: Payer: 59 | Admitting: Internal Medicine

## 2021-12-29 VITALS — BP 101/62 | HR 45 | Temp 98.3°F | Resp 16

## 2021-12-29 VITALS — BP 119/69 | HR 65 | Temp 97.8°F | Resp 16 | Ht 72.0 in | Wt 210.7 lb

## 2021-12-29 DIAGNOSIS — Z8249 Family history of ischemic heart disease and other diseases of the circulatory system: Secondary | ICD-10-CM | POA: Diagnosis not present

## 2021-12-29 DIAGNOSIS — Z5112 Encounter for antineoplastic immunotherapy: Secondary | ICD-10-CM | POA: Insufficient documentation

## 2021-12-29 DIAGNOSIS — C711 Malignant neoplasm of frontal lobe: Secondary | ICD-10-CM

## 2021-12-29 DIAGNOSIS — R569 Unspecified convulsions: Secondary | ICD-10-CM | POA: Insufficient documentation

## 2021-12-29 DIAGNOSIS — Z808 Family history of malignant neoplasm of other organs or systems: Secondary | ICD-10-CM | POA: Insufficient documentation

## 2021-12-29 DIAGNOSIS — Z7901 Long term (current) use of anticoagulants: Secondary | ICD-10-CM | POA: Diagnosis not present

## 2021-12-29 DIAGNOSIS — D696 Thrombocytopenia, unspecified: Secondary | ICD-10-CM | POA: Diagnosis not present

## 2021-12-29 DIAGNOSIS — R531 Weakness: Secondary | ICD-10-CM | POA: Diagnosis not present

## 2021-12-29 DIAGNOSIS — Z833 Family history of diabetes mellitus: Secondary | ICD-10-CM | POA: Insufficient documentation

## 2021-12-29 DIAGNOSIS — F17210 Nicotine dependence, cigarettes, uncomplicated: Secondary | ICD-10-CM | POA: Diagnosis not present

## 2021-12-29 DIAGNOSIS — Z885 Allergy status to narcotic agent status: Secondary | ICD-10-CM | POA: Insufficient documentation

## 2021-12-29 DIAGNOSIS — Q283 Other malformations of cerebral vessels: Secondary | ICD-10-CM | POA: Insufficient documentation

## 2021-12-29 DIAGNOSIS — Z79899 Other long term (current) drug therapy: Secondary | ICD-10-CM | POA: Insufficient documentation

## 2021-12-29 DIAGNOSIS — Z79630 Long term (current) use of alkylating agent: Secondary | ICD-10-CM | POA: Diagnosis not present

## 2021-12-29 DIAGNOSIS — Z9049 Acquired absence of other specified parts of digestive tract: Secondary | ICD-10-CM | POA: Insufficient documentation

## 2021-12-29 DIAGNOSIS — M79605 Pain in left leg: Secondary | ICD-10-CM | POA: Insufficient documentation

## 2021-12-29 DIAGNOSIS — C719 Malignant neoplasm of brain, unspecified: Secondary | ICD-10-CM

## 2021-12-29 DIAGNOSIS — R69 Illness, unspecified: Secondary | ICD-10-CM | POA: Diagnosis not present

## 2021-12-29 LAB — CBC WITH DIFFERENTIAL (CANCER CENTER ONLY)
Abs Immature Granulocytes: 0.01 10*3/uL (ref 0.00–0.07)
Basophils Absolute: 0 10*3/uL (ref 0.0–0.1)
Basophils Relative: 1 %
Eosinophils Absolute: 0.2 10*3/uL (ref 0.0–0.5)
Eosinophils Relative: 3 %
HCT: 41.8 % (ref 39.0–52.0)
Hemoglobin: 14.6 g/dL (ref 13.0–17.0)
Immature Granulocytes: 0 %
Lymphocytes Relative: 16 %
Lymphs Abs: 0.8 10*3/uL (ref 0.7–4.0)
MCH: 32.3 pg (ref 26.0–34.0)
MCHC: 34.9 g/dL (ref 30.0–36.0)
MCV: 92.5 fL (ref 80.0–100.0)
Monocytes Absolute: 0.3 10*3/uL (ref 0.1–1.0)
Monocytes Relative: 6 %
Neutro Abs: 3.9 10*3/uL (ref 1.7–7.7)
Neutrophils Relative %: 74 %
Platelet Count: 242 10*3/uL (ref 150–400)
RBC: 4.52 MIL/uL (ref 4.22–5.81)
RDW: 14.7 % (ref 11.5–15.5)
WBC Count: 5.3 10*3/uL (ref 4.0–10.5)
nRBC: 0 % (ref 0.0–0.2)

## 2021-12-29 LAB — CMP (CANCER CENTER ONLY)
ALT: 11 U/L (ref 0–44)
AST: 12 U/L — ABNORMAL LOW (ref 15–41)
Albumin: 4.3 g/dL (ref 3.5–5.0)
Alkaline Phosphatase: 51 U/L (ref 38–126)
Anion gap: 5 (ref 5–15)
BUN: 19 mg/dL (ref 6–20)
CO2: 27 mmol/L (ref 22–32)
Calcium: 9.6 mg/dL (ref 8.9–10.3)
Chloride: 109 mmol/L (ref 98–111)
Creatinine: 1.41 mg/dL — ABNORMAL HIGH (ref 0.61–1.24)
GFR, Estimated: 60 mL/min — ABNORMAL LOW (ref >60)
Glucose, Bld: 90 mg/dL (ref 70–99)
Potassium: 4.4 mmol/L (ref 3.5–5.1)
Sodium: 141 mmol/L (ref 135–145)
Total Bilirubin: 0.7 mg/dL (ref 0.3–1.2)
Total Protein: 6.6 g/dL (ref 6.5–8.1)

## 2021-12-29 LAB — TOTAL PROTEIN, URINE DIPSTICK: Protein, ur: NEGATIVE mg/dL

## 2021-12-29 MED ORDER — SODIUM CHLORIDE 0.9 % IV SOLN
10.0000 mg/kg | Freq: Once | INTRAVENOUS | Status: AC
  Administered 2021-12-29: 900 mg via INTRAVENOUS
  Filled 2021-12-29: qty 32

## 2021-12-29 MED ORDER — SODIUM CHLORIDE 0.9 % IV SOLN: Freq: Once | INTRAVENOUS | Status: AC

## 2021-12-29 NOTE — Progress Notes (Signed)
[facility] [place], [place] [address] [phone]   Interval Evaluation  Date of Service: 12/29/21 Patient Name: Jerry Richards Patient MRN: [id number] Patient DOB: 1970/08/23 Provider: Ventura Sellers, MD  Identifying Statement:  Jerry Richards is a 51 y.o. male with right frontal glioblastoma   Oncologic History: Oncology History  High grade glioma not classifiable by WHO criteria ([place])  05/16/2021 Surgery   Craniotomy, R parafalcine resection with Dr. Annette Richards; path is high grade glioma IDHwt   06/20/2021 - 06/20/2021 Chemotherapy   Patient is on Treatment Plan : BRAIN GLIOBLASTOMA Radiation Therapy With Concurrent Temozolomide 75 mg/m2 Daily Followed By Sequential Maintenance Temozolomide x 6-12 cycles     09/05/2021 - 12/17/2021 Chemotherapy   Completes 3 cycles of adjuvant 5-day Temodar   12/18/2021 Progression   Progression of disease #1   12/29/2021 -  Chemotherapy   Initiates second line therapy with CCNU 29m/m2 q6 weeks, and Avastin 178mkg IV q2 weeks   Glioblastoma multiforme of frontal lobe ([facility] 09/12/2021 Initial Diagnosis   [facility]li[facility]e of frontal [facility] ([facility]  12/22/2021 -  Chemotherapy   Patient is on Treatment Plan : BRAIN GBM Bevacizumab q14d       Biomarkers:  MGMT Unknown.  IDH 1/2 Wild type.  EGFR Unknown  TERT Unknown   Interval History: Jerry BEARCEresents today for initial avastin infusion, start of CCNU therapy.  No clinical changes today.  Denies any recurrence of seizures, no headaches.  Leg symptoms are Richards.  Continues to have some pain in the left leg as prior.  H+P (06/07/21): Patient presented to neurologic attention in March with several days of progressive left sided weakness and seizures, characterized by twitching of the left leg.  CNS imaging demonstrated a right frontal mass.  He underwent craniotomy and resection with Dr. PoAnnette Richards 05/16/21; path demonstrated  high grade glioma IDHwt.  Following surgery his left leg was much weaker, though it has improved with aggressive rehab efforts.  Currently he is using a cane to ambulate.  He has no other complaints, continues on decadron 50m13mwice per day.  No seizures since surgery.  Medications: Current Outpatient Medications on File Prior to Visit  Medication Sig Dispense Refill   acetaminophen (TYLENOL) 500 MG tablet Take 1,000 mg by mouth every 6 (six) hours as needed for moderate pain.     albuterol (VENTOLIN HFA) 108 (90 Base) MCG/ACT inhaler Inhale 1 puff into the lungs in the morning, at noon, in the evening, and at bedtime. (Patient not taking: Reported on 11/14/2021)     levETIRAcetam (KEPPRA) 500 MG tablet TAKE 2 TABLETS(1000 MG) BY MOUTH TWICE DAILY 360 tablet 0   losartan (COZAAR) 50 MG tablet Take 50 mg by mouth daily.  6   mirtazapine (REMERON) 15 MG tablet Take 15 mg by mouth at bedtime.     ondansetron (ZOFRAN) 8 MG tablet Take 1 tablet (8 mg total) by mouth every 8 (eight) hours as needed for nausea or vomiting. 30 tablet 1   rivaroxaban (XARELTO) 20 MG TABS tablet Take 1 tablet (20 mg total) by mouth daily. 90 tablet 1   No current facility-administered medications on file prior to visit.    Allergies:  Allergies  Allergen Reactions   Amiodarone     AMIODARONE ANALOGUES Hyperthyroidism   Morphine And Related Palpitations    Chest pressure/palpitations   Past Medical History:  Past Medical History:  Diagnosis  Date   Anxiety    Atrial fibrillation [facility])    a. 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 MOST RECENT ECHO  (03/2012) EF= 66%   Past Surgical History:  Past Surgical History:  Procedure Laterality Date   APPLICATION OF CRANIAL NAVIGATION Right 05/16/2021   Procedure: APPLICATION OF CRANIAL NAVIGATION;  Surgeon: Earnie Larsson, MD;  Location: [facility];  Service: Neurosurgery;  Laterality: Right;   ATRIAL ABLATION SURGERY     CARDIAC CATHETERIZATION  08/2011   [facility]   CHOLECYSTECTOMY     CRANIOTOMY Right 05/16/2021   Procedure: Craniotomy - right - Parietal with  brainlab;  Surgeon: Earnie Larsson, MD;  Location: [facility];  Service: Neurosurgery;  Laterality: Right;   FINGER SURGERY     LEFT HEART CATHETERIZATION WITH CORONARY ANGIOGRAM N/A 09/12/2011   Procedure: LEFT HEART CATHETERIZATION WITH CORONARY ANGIOGRAM;  Surgeon: Hillary Bow, MD;  Location: [facility] CATH LAB;  Service: Cardiovascular;  Laterality: N/A;   VASECTOMY     Social History:  Social History   Socioeconomic History   Marital status: Married    Spouse name: tanya   Number of children: 2   Years of education: Not on file   Highest education level: Some college, no degree  Occupational History   Occupation: SELF EMPLOYED    Comment: Nurse, 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 Occas tea   Social Determinants of Health   Financial Resource Strain: Not on file  Food Insecurity: Not on file  Transportation Needs: Not on file  Physical Activity: Not on file  Stress: Not on file  Social Connections: Not on file  Intimate Partner Violence: Not on file   Family History:  Family  History  Problem Relation Age of Onset   Healthy Mother        age 26   Melanoma Mother    Melanoma Father    Atrial fibrillation Father        age 35   Diabetes Maternal Grandmother    Coronary artery disease Paternal Grandmother    Heart disease Paternal Grandmother    Heart attack Paternal Grandmother    Coronary artery disease Paternal Grandfather    Heart disease Paternal Grandfather    Heart attack Paternal Grandfather    Atrial fibrillation Paternal Aunt    Atrial fibrillation Paternal Uncle    Colon cancer Neg Hx  Esophageal cancer Neg Hx    Ovarian cancer Neg Hx    Stomach cancer Neg Hx    Rectal cancer Neg Hx     Review of Systems: Constitutional: Doesn't report fevers, chills or abnormal weight loss Eyes: Doesn't report blurriness of vision Ears, nose, mouth, throat, and face: Doesn't report sore throat Respiratory: Doesn't report cough, dyspnea or wheezes Cardiovascular: Doesn't report palpitation, chest discomfort  Gastrointestinal:  Doesn't report nausea, constipation, diarrhea GU: Doesn't report incontinence Skin: Doesn't report skin rashes Neurological: Per HPI Musculoskeletal: Doesn't report joint pain Behavioral/Psych: Doesn't report anxiety  Physical Exam: Vitals:   12/29/21 0854  BP: 119/69  Pulse: 65  Resp: 16  Temp: 97.8 F (36.6 C)  SpO2: 99%   KPS: 80. General: Alert, cooperative, pleasant, in no acute distress Head: Normal EENT: No conjunctival injection or scleral icterus.  Lungs: Resp effort normal Cardiac: Regular rate Abdomen: Non-distended abdomen Skin: No rashes cyanosis or petechiae. Extremities: No clubbing or edema  Neurologic Exam: Mental Status: Awake, alert, attentive to examiner. Oriented to self and environment. Language is fluent with intact comprehension.  Cranial Nerves: Visual acuity is grossly normal. Visual fields are full. Extra-ocular movements intact. No ptosis. Face is symmetric Motor: Tone and bulk are  normal. Power is 4/5 in left leg, 5/5 otherwise. Reflexes are symmetric, no pathologic reflexes present.  Sensory: Intact to light touch Gait: Hemiparetic, cane assisted.   Labs: I have reviewed the data as listed    Component Value Date/Time   NA 139 12/19/2021 0852   NA 140 03/29/2017 1045   K 4.3 12/19/2021 0852   CL 106 12/19/2021 0852   CO2 28 12/19/2021 0852   GLUCOSE 79 12/19/2021 0852   BUN 19 12/19/2021 0852   BUN 18 03/29/2017 1045   CREATININE 1.41 (H) 12/19/2021 0852   CALCIUM 9.8 12/19/2021 0852   PROT 7.1 12/19/2021 0852   PROT 7.7 03/29/2017 1045   ALBUMIN 4.5 12/19/2021 0852   ALBUMIN 5.2 03/29/2017 1045   AST 14 (L) 12/19/2021 0852   ALT 14 12/19/2021 0852   ALKPHOS 55 12/19/2021 0852   BILITOT 0.8 12/19/2021 0852   GFRNONAA 60 (L) 12/19/2021 0852   GFRAA 56 (L) 03/29/2017 1045   Lab Results  Component Value Date   WBC 5.3 12/29/2021   NEUTROABS 3.9 12/29/2021   HGB 14.6 12/29/2021   HCT 41.8 12/29/2021   MCV 92.5 12/29/2021   PLT 242 12/29/2021   Imaging:  [facility] Clinician Interpretation: I have personally reviewed the CNS images as listed.  My interpretation, in the context of the patient's clinical presentation, is progressive disease  MR BRAIN W WO CONTRAST  Result Date: 12/18/2021 CLINICAL DATA:  High-grade glioma EXAM: MRI HEAD WITHOUT AND WITH CONTRAST TECHNIQUE: Multiplanar, multiecho pulse sequences of the brain and surrounding structures were obtained without and with intravenous contrast. CONTRAST:  81m GADAVIST GADOBUTROL 1 MMOL/ML IV SOLN COMPARISON:  11/07/2021 FINDINGS: Brain: 3 areas of infiltrative signal abnormality in the right cerebral hemisphere, seemingly separate but presumed microscopic continuity. Beneath the high right frontal craniotomy site is a necrotic enhancing lesion which shows mild increase, especially along the posterior extent, now up to 2.4 cm in maximal span as compared to 2.2 cm previously. A new small adjacent focus  of nodular enhancement is seen anteriorly. Mildly increased extent of regional T2 hyperintensity. Significant increase in T2 signal in the right periatrial white matter with 2 areas of heterogeneous enhancement, the larger measuring 16 mm, enhancement also progressed. Increased  diffusion hyperintensity at the level of enhancement. Similar appearance of infiltrative T2 hyperintensity in the anterior right temporal lobe, no enhancement at this level. Developmental venous anomaly in the superior left frontal lobe. No infarct, hydrocephalus, or worrisome mass effect. Vascular: Major flow voids and vascular enhancements are preserved Skull and upper cervical spine: Normal marrow signal Sinuses/Orbits: Negative IMPRESSION: 1. Three areas of T2 hyperintensity in the right cerebral hemisphere compatible with multifocal glioma. 2. At the vertex, mild size progression of the enhancing lesion and increased T2 hyperintensity. 3. In the right periatrial white matter significant increase in T2 signal with 2 areas of necrotic pattern enhancement, consistent with progressive tumor. 4. Infiltrative appearance at the right anterior temporal lobe is nonenhancing and appears Richards. Electronically Signed   By: Jorje Guild M.D.   On: 12/18/2021 11:40     Assessment/Plan Glioblastoma multiforme of frontal lobe [facility]) - Plan: Clinic Appointment Request  SHASHWAT CLEARY is clinically Richards today, here for initiation of CCNU/avastin.  Labs demonstrate improvement in thrombocytopenia, otherwise normal.  He would like to proceed with second line therapy; we will recommend CCNU 10m/m2 PO q6 weeks, in addition to avastin 138mkg IV q2 weeks.  Avastin will be utilized because of high growth rate and tumor location near eloquent motor structures.  We reviewed side effects of CCNU, including nausea/vomiting, cytopenias.  Effects of avastin could include hypertension, bleedin/clotting, wound healing impairment.  We will check blood  counts every 2 weeks concurrent with avastin pre-treat visits.     Chemotherapy should be held for the following:  ANC less than 1,000  Platelets less than 100,000  LFT or creatinine greater than 2x ULN  If clinical concerns/contraindications develop  Avastin should be held for the following:  ANC less than 500  Platelets less than 50,000  LFT or creatinine greater than 2x ULN  If clinical concerns/contraindications develop  Keppra will remain at 100067mID for now.  We ask that Wal[facility]rin Parkerturn to clinic in 2 weeks for next avastin infusion, or sooner as needed.  All questions were answered. The patient knows to call the clinic with any problems, questions or concerns. No barriers to learning were detected.  The total time spent in the encounter was 30 minutes and more than 50% was on counseling and review of test results   ZacVentura SellersD Medical Director of Neuro-Oncology [facility] [place]/02/23 9:05 AM

## 2021-12-29 NOTE — Patient Instructions (Signed)
[facility]  Discharge Instructions: Thank you for choosing [facility] to provide your oncology and hematology care.   If you have a lab appointment with the [facility], please go directly to the [facility] and check in at the registration area.   Wear comfortable clothing and clothing appropriate for easy access to any Portacath or PICC line.   We strive to give you quality time with your provider. You may need to reschedule your appointment if you arrive late (15 or more minutes).  Arriving late affects you and other patients whose appointments are after yours.  Also, if you miss three or more appointments without notifying the office, you may be dismissed from the clinic at the provider's discretion.      For prescription refill requests, have your pharmacy contact our office and allow 72 hours for refills to be completed.    Today you received the following chemotherapy and/or immunotherapy agents: Bevacizumab (Mvasi)      To help prevent nausea and vomiting after your treatment, we encourage you to take your nausea medication as directed.  BELOW ARE SYMPTOMS THAT SHOULD BE REPORTED IMMEDIATELY: *FEVER GREATER THAN 100.4 F (38 C) OR HIGHER *CHILLS OR SWEATING *NAUSEA AND VOMITING THAT IS NOT CONTROLLED WITH YOUR NAUSEA MEDICATION *UNUSUAL SHORTNESS OF BREATH *UNUSUAL BRUISING OR BLEEDING *URINARY PROBLEMS (pain or burning when urinating, or frequent urination) *BOWEL PROBLEMS (unusual diarrhea, constipation, pain near the anus) TENDERNESS IN MOUTH AND THROAT WITH OR WITHOUT PRESENCE OF ULCERS (sore throat, sores in mouth, or a toothache) UNUSUAL RASH, SWELLING OR PAIN  UNUSUAL VAGINAL DISCHARGE OR ITCHING   Items with * indicate a potential emergency and should be followed up as soon as possible or go to the Emergency Department if any problems should occur.  Please show the CHEMOTHERAPY ALERT CARD or IMMUNOTHERAPY ALERT CARD at  check-in to the Emergency Department and triage nurse.  Should you have questions after your visit or need to cancel or reschedule your appointment, please contact [facility]  Dept: [phone]  and follow the prompts.  Office hours are 8:00 a.m. to 4:30 p.m. Monday - Friday. Please note that voicemails left after 4:00 p.m. may not be returned until the following business day.  We are closed weekends and major holidays. You have access to a nurse at all times for urgent questions. Please call the main number to the clinic Dept: [phone] and follow the prompts.   For any non-urgent questions, you may also contact your provider using MyChart. We now offer e-Visits for anyone 43 and older to request care online for non-urgent symptoms. For details visit [email or web address].   Also download the MyChart app! Go to the app store, search "MyChart", open the app, select [facility], and log in with your MyChart username and password.  Masks are optional in the cancer centers. If you would like for your care team to wear a mask while they are taking care of you, please let them know. You may have one support person who is at least 53 years old accompany you for your appointments. Bevacizumab Injection What is this medication? BEVACIZUMAB (be va SIZ yoo mab) treats some types of cancer. It works by blocking a protein that causes cancer cells to grow and multiply. This helps to slow or stop the spread of cancer cells. It is a monoclonal antibody. This medicine may be used for other purposes; ask your health care provider or  pharmacist if you have questions. COMMON BRAND NAME(S): Alymsys, Avastin, MVASI, Noah Charon What should I tell my care team before I take this medication? They need to know if you have any of these conditions: Blood clots Coughing up blood Having or recent surgery Heart failure High blood pressure History of a connection between 2 or more body parts  that do not usually connect (fistula) History of a tear in your stomach or intestines Protein in your urine An unusual or allergic reaction to bevacizumab, other medications, foods, dyes, or preservatives Pregnant or trying to get pregnant Breast-feeding How should I use this medication? This medication is injected into a vein. It is given by your care team in a hospital or clinic setting. Talk to your care team the use of this medication in children. Special care may be needed. Overdosage: If you think you have taken too much of this medicine contact a poison control center or emergency room at once. NOTE: This medicine is only for you. Do not share this medicine with others. What if I miss a dose? Keep appointments for follow-up doses. It is important not to miss your dose. Call your care team if you are unable to keep an appointment. What may interact with this medication? Interactions are not expected. This list may not describe all possible interactions. Give your health care provider a list of all the medicines, herbs, non-prescription drugs, or dietary supplements you use. Also tell them if you smoke, drink alcohol, or use illegal drugs. Some items may interact with your medicine. What should I watch for while using this medication? Your condition will be monitored carefully while you are receiving this medication. You may need blood work while taking this medication. This medication may make you feel generally unwell. This is not uncommon as chemotherapy can affect healthy cells as well as cancer cells. Report any side effects. Continue your course of treatment even though you feel ill unless your care team tells you to stop. This medication may increase your risk to bruise or bleed. Call your care team if you notice any unusual bleeding. Before having surgery, talk to your care team to make sure it is ok. This medication can increase the risk of poor healing of your surgical site or  wound. You will need to stop this medication for 28 days before surgery. After surgery, wait at least 28 days before restarting this medication. Make sure the surgical site or wound is healed enough before restarting this medication. Talk to your care team if questions. Talk to your care team if you may be pregnant. Serious birth defects can occur if you take this medication during pregnancy and for 6 months after the last dose. Contraception is recommended while taking this medication and for 6 months after the last dose. Your care team can help you find the option that works for you. Do not breastfeed while taking this medication and for 6 months after the last dose. This medication can cause infertility. Talk to your care team if you are concerned about your fertility. What side effects may I notice from receiving this medication? Side effects that you should report to your care team as soon as possible: Allergic reactions--skin rash, itching, hives, swelling of the face, lips, tongue, or throat Bleeding--bloody or black, tar-like stools, vomiting blood or brown material that looks like coffee grounds, red or dark brown urine, small red or purple spots on skin, unusual bruising or bleeding Blood clot--pain, swelling, or warmth in the leg,  shortness of breath, chest pain Heart attack--pain or tightness in the chest, shoulders, arms, or jaw, nausea, shortness of breath, cold or clammy skin, feeling faint or lightheaded Heart failure--shortness of breath, swelling of the ankles, feet, or hands, sudden weight gain, unusual weakness or fatigue Increase in blood pressure Infection--fever, chills, cough, sore throat, wounds that don't heal, pain or trouble when passing urine, general feeling of discomfort or being unwell Infusion reactions--chest pain, shortness of breath or trouble breathing, feeling faint or lightheaded Kidney injury--decrease in the amount of urine, swelling of the ankles, hands, or  feet Stomach pain that is severe, does not go away, or gets worse Stroke--sudden numbness or weakness of the face, arm, or leg, trouble speaking, confusion, trouble walking, loss of balance or coordination, dizziness, severe headache, change in vision Sudden and severe headache, confusion, change in vision, seizures, which may be signs of posterior reversible encephalopathy syndrome (PRES) Side effects that usually do not require medical attention (report to your care team if they continue or are bothersome): Back pain Change in taste Diarrhea Dry skin Increased tears Nosebleed This list may not describe all possible side effects. Call your doctor for medical advice about side effects. You may report side effects to FDA at 1-800-FDA-1088. Where should I keep my medication? This medication is given in a hospital or clinic. It will not be stored at home. NOTE: This sheet is a summary. It may not cover all possible information. If you have questions about this medicine, talk to your doctor, pharmacist, or health care provider.  2023 Elsevier/Gold Standard (2021-06-17 00:00:00)

## 2021-12-30 ENCOUNTER — Other Ambulatory Visit: Payer: Self-pay

## 2021-12-30 ENCOUNTER — Telehealth: Payer: Self-pay

## 2021-12-30 NOTE — Telephone Encounter (Signed)
-----   Message from Alvera Singh, RN sent at 12/29/2021  2:07 PM EDT ----- Regarding: First time Mvasi 12/29/21- Patient tolerated his bevacizumab well. Dr. Renda Rolls patient.

## 2021-12-30 NOTE — Telephone Encounter (Signed)
Jerry Richards states that he is doing fine.  He is eating,drinking, and urinating well. He knows to call the office at [phone] if he has any questions or concerns.

## 2022-01-02 ENCOUNTER — Telehealth: Payer: Self-pay | Admitting: Internal Medicine

## 2022-01-02 NOTE — Telephone Encounter (Signed)
Per 11/2 los called and spoke to pt about appointments

## 2022-01-03 ENCOUNTER — Other Ambulatory Visit: Payer: Self-pay

## 2022-01-09 ENCOUNTER — Other Ambulatory Visit (HOSPITAL_COMMUNITY): Payer: Self-pay

## 2022-01-09 MED ORDER — RIVAROXABAN 20 MG PO TABS
20.0000 mg | ORAL_TABLET | Freq: Every day | ORAL | 1 refills | Status: DC
  Filled 2022-01-09: qty 30, 30d supply, fill #0
  Filled 2022-02-08: qty 30, 30d supply, fill #1
  Filled 2022-03-09: qty 30, 30d supply, fill #2
  Filled 2022-04-10: qty 30, 30d supply, fill #0
  Filled 2022-05-10: qty 30, 30d supply, fill #1
  Filled 2022-06-08: qty 30, 30d supply, fill #2

## 2022-01-10 ENCOUNTER — Other Ambulatory Visit (HOSPITAL_COMMUNITY): Payer: Self-pay

## 2022-01-11 ENCOUNTER — Other Ambulatory Visit: Payer: Self-pay

## 2022-01-11 ENCOUNTER — Other Ambulatory Visit (HOSPITAL_COMMUNITY): Payer: Self-pay

## 2022-01-12 ENCOUNTER — Inpatient Hospital Stay: Payer: 59

## 2022-01-12 ENCOUNTER — Inpatient Hospital Stay (HOSPITAL_BASED_OUTPATIENT_CLINIC_OR_DEPARTMENT_OTHER): Payer: 59 | Admitting: Internal Medicine

## 2022-01-12 ENCOUNTER — Other Ambulatory Visit: Payer: Self-pay

## 2022-01-12 VITALS — BP 124/73 | HR 50 | Temp 97.7°F | Resp 16 | Wt 210.6 lb

## 2022-01-12 DIAGNOSIS — Q283 Other malformations of cerebral vessels: Secondary | ICD-10-CM | POA: Diagnosis not present

## 2022-01-12 DIAGNOSIS — Z885 Allergy status to narcotic agent status: Secondary | ICD-10-CM | POA: Diagnosis not present

## 2022-01-12 DIAGNOSIS — C719 Malignant neoplasm of brain, unspecified: Secondary | ICD-10-CM

## 2022-01-12 DIAGNOSIS — Z79630 Long term (current) use of alkylating agent: Secondary | ICD-10-CM | POA: Diagnosis not present

## 2022-01-12 DIAGNOSIS — Z79899 Other long term (current) drug therapy: Secondary | ICD-10-CM | POA: Diagnosis not present

## 2022-01-12 DIAGNOSIS — R569 Unspecified convulsions: Secondary | ICD-10-CM | POA: Diagnosis not present

## 2022-01-12 DIAGNOSIS — Z833 Family history of diabetes mellitus: Secondary | ICD-10-CM | POA: Diagnosis not present

## 2022-01-12 DIAGNOSIS — Z808 Family history of malignant neoplasm of other organs or systems: Secondary | ICD-10-CM | POA: Diagnosis not present

## 2022-01-12 DIAGNOSIS — C711 Malignant neoplasm of frontal lobe: Secondary | ICD-10-CM | POA: Diagnosis not present

## 2022-01-12 DIAGNOSIS — M79605 Pain in left leg: Secondary | ICD-10-CM | POA: Diagnosis not present

## 2022-01-12 DIAGNOSIS — Z7901 Long term (current) use of anticoagulants: Secondary | ICD-10-CM | POA: Diagnosis not present

## 2022-01-12 DIAGNOSIS — D696 Thrombocytopenia, unspecified: Secondary | ICD-10-CM | POA: Diagnosis not present

## 2022-01-12 DIAGNOSIS — Z5112 Encounter for antineoplastic immunotherapy: Secondary | ICD-10-CM | POA: Diagnosis not present

## 2022-01-12 DIAGNOSIS — Z8249 Family history of ischemic heart disease and other diseases of the circulatory system: Secondary | ICD-10-CM | POA: Diagnosis not present

## 2022-01-12 DIAGNOSIS — R531 Weakness: Secondary | ICD-10-CM | POA: Diagnosis not present

## 2022-01-12 DIAGNOSIS — Z9049 Acquired absence of other specified parts of digestive tract: Secondary | ICD-10-CM | POA: Diagnosis not present

## 2022-01-12 DIAGNOSIS — R69 Illness, unspecified: Secondary | ICD-10-CM | POA: Diagnosis not present

## 2022-01-12 LAB — CBC WITH DIFFERENTIAL (CANCER CENTER ONLY)
Abs Immature Granulocytes: 0.02 10*3/uL (ref 0.00–0.07)
Basophils Absolute: 0 10*3/uL (ref 0.0–0.1)
Basophils Relative: 0 %
Eosinophils Absolute: 0.1 10*3/uL (ref 0.0–0.5)
Eosinophils Relative: 2 %
HCT: 40.8 % (ref 39.0–52.0)
Hemoglobin: 14.6 g/dL (ref 13.0–17.0)
Immature Granulocytes: 0 %
Lymphocytes Relative: 17 %
Lymphs Abs: 1 10*3/uL (ref 0.7–4.0)
MCH: 33.2 pg (ref 26.0–34.0)
MCHC: 35.8 g/dL (ref 30.0–36.0)
MCV: 92.7 fL (ref 80.0–100.0)
Monocytes Absolute: 0.6 10*3/uL (ref 0.1–1.0)
Monocytes Relative: 10 %
Neutro Abs: 4 10*3/uL (ref 1.7–7.7)
Neutrophils Relative %: 71 %
Platelet Count: 141 10*3/uL — ABNORMAL LOW (ref 150–400)
RBC: 4.4 MIL/uL (ref 4.22–5.81)
RDW: 15.3 % (ref 11.5–15.5)
WBC Count: 5.7 10*3/uL (ref 4.0–10.5)
nRBC: 0 % (ref 0.0–0.2)

## 2022-01-12 LAB — CMP (CANCER CENTER ONLY)
ALT: 9 U/L (ref 0–44)
AST: 13 U/L — ABNORMAL LOW (ref 15–41)
Albumin: 4.4 g/dL (ref 3.5–5.0)
Alkaline Phosphatase: 48 U/L (ref 38–126)
Anion gap: 7 (ref 5–15)
BUN: 21 mg/dL — ABNORMAL HIGH (ref 6–20)
CO2: 25 mmol/L (ref 22–32)
Calcium: 9.6 mg/dL (ref 8.9–10.3)
Chloride: 108 mmol/L (ref 98–111)
Creatinine: 1.29 mg/dL — ABNORMAL HIGH (ref 0.61–1.24)
GFR, Estimated: >60 mL/min (ref >60)
Glucose, Bld: 96 mg/dL (ref 70–99)
Potassium: 4.4 mmol/L (ref 3.5–5.1)
Sodium: 140 mmol/L (ref 135–145)
Total Bilirubin: 0.8 mg/dL (ref 0.3–1.2)
Total Protein: 6.9 g/dL (ref 6.5–8.1)

## 2022-01-12 MED ORDER — SODIUM CHLORIDE 0.9 % IV SOLN
10.0000 mg/kg | Freq: Once | INTRAVENOUS | Status: AC
  Administered 2022-01-12: 900 mg via INTRAVENOUS
  Filled 2022-01-12: qty 32

## 2022-01-12 MED ORDER — SODIUM CHLORIDE 0.9 % IV SOLN: Freq: Once | INTRAVENOUS | Status: AC

## 2022-01-12 NOTE — Patient Instructions (Signed)
[facility]  Discharge Instructions: Thank you for choosing [facility] to provide your oncology and hematology care.   If you have a lab appointment with the [facility], please go directly to the [facility] and check in at the registration area.   Wear comfortable clothing and clothing appropriate for easy access to any Portacath or PICC line.   We strive to give you quality time with your provider. You may need to reschedule your appointment if you arrive late (15 or more minutes).  Arriving late affects you and other patients whose appointments are after yours.  Also, if you miss three or more appointments without notifying the office, you may be dismissed from the clinic at the provider's discretion.      For prescription refill requests, have your pharmacy contact our office and allow 72 hours for refills to be completed.    Today you received the following chemotherapy and/or immunotherapy agents: Bevacizumab (Mvasi)      To help prevent nausea and vomiting after your treatment, we encourage you to take your nausea medication as directed.  BELOW ARE SYMPTOMS THAT SHOULD BE REPORTED IMMEDIATELY: *FEVER GREATER THAN 100.4 F (38 C) OR HIGHER *CHILLS OR SWEATING *NAUSEA AND VOMITING THAT IS NOT CONTROLLED WITH YOUR NAUSEA MEDICATION *UNUSUAL SHORTNESS OF BREATH *UNUSUAL BRUISING OR BLEEDING *URINARY PROBLEMS (pain or burning when urinating, or frequent urination) *BOWEL PROBLEMS (unusual diarrhea, constipation, pain near the anus) TENDERNESS IN MOUTH AND THROAT WITH OR WITHOUT PRESENCE OF ULCERS (sore throat, sores in mouth, or a toothache) UNUSUAL RASH, SWELLING OR PAIN  UNUSUAL VAGINAL DISCHARGE OR ITCHING   Items with * indicate a potential emergency and should be followed up as soon as possible or go to the Emergency Department if any problems should occur.  Please show the CHEMOTHERAPY ALERT CARD or IMMUNOTHERAPY ALERT CARD at  check-in to the Emergency Department and triage nurse.  Should you have questions after your visit or need to cancel or reschedule your appointment, please contact [facility]  Dept: [phone]  and follow the prompts.  Office hours are 8:00 a.m. to 4:30 p.m. Monday - Friday. Please note that voicemails left after 4:00 p.m. may not be returned until the following business day.  We are closed weekends and major holidays. You have access to a nurse at all times for urgent questions. Please call the main number to the clinic Dept: [phone] and follow the prompts.   For any non-urgent questions, you may also contact your provider using MyChart. We now offer e-Visits for anyone 57 and older to request care online for non-urgent symptoms. For details visit [email or web address].   Also download the MyChart app! Go to the app store, search "MyChart", open the app, select [facility], and log in with your MyChart username and password.  Masks are optional in the cancer centers. If you would like for your care team to wear a mask while they are taking care of you, please let them know. You may have one support Dot Splinter who is at least 53 years old accompany you for your appointments. Bevacizumab Injection What is this medication? BEVACIZUMAB (be va SIZ yoo mab) treats some types of cancer. It works by blocking a protein that causes cancer cells to grow and multiply. This helps to slow or stop the spread of cancer cells. It is a monoclonal antibody. This medicine may be used for other purposes; ask your health care provider or  pharmacist if you have questions. COMMON BRAND NAME(S): Alymsys, Avastin, MVASI, Noah Charon What should I tell my care team before I take this medication? They need to know if you have any of these conditions: Blood clots Coughing up blood Having or recent surgery Heart failure High blood pressure History of a connection between 2 or more body parts  that do not usually connect (fistula) History of a tear in your stomach or intestines Protein in your urine An unusual or allergic reaction to bevacizumab, other medications, foods, dyes, or preservatives Pregnant or trying to get pregnant Breast-feeding How should I use this medication? This medication is injected into a vein. It is given by your care team in a hospital or clinic setting. Talk to your care team the use of this medication in children. Special care may be needed. Overdosage: If you think you have taken too much of this medicine contact a poison control center or emergency room at once. NOTE: This medicine is only for you. Do not share this medicine with others. What if I miss a dose? Keep appointments for follow-up doses. It is important not to miss your dose. Call your care team if you are unable to keep an appointment. What may interact with this medication? Interactions are not expected. This list may not describe all possible interactions. Give your health care provider a list of all the medicines, herbs, non-prescription drugs, or dietary supplements you use. Also tell them if you smoke, drink alcohol, or use illegal drugs. Some items may interact with your medicine. What should I watch for while using this medication? Your condition will be monitored carefully while you are receiving this medication. You may need blood work while taking this medication. This medication may make you feel generally unwell. This is not uncommon as chemotherapy can affect healthy cells as well as cancer cells. Report any side effects. Continue your course of treatment even though you feel ill unless your care team tells you to stop. This medication may increase your risk to bruise or bleed. Call your care team if you notice any unusual bleeding. Before having surgery, talk to your care team to make sure it is ok. This medication can increase the risk of poor healing of your surgical site or  wound. You will need to stop this medication for 28 days before surgery. After surgery, wait at least 28 days before restarting this medication. Make sure the surgical site or wound is healed enough before restarting this medication. Talk to your care team if questions. Talk to your care team if you may be pregnant. Serious birth defects can occur if you take this medication during pregnancy and for 6 months after the last dose. Contraception is recommended while taking this medication and for 6 months after the last dose. Your care team can help you find the option that works for you. Do not breastfeed while taking this medication and for 6 months after the last dose. This medication can cause infertility. Talk to your care team if you are concerned about your fertility. What side effects may I notice from receiving this medication? Side effects that you should report to your care team as soon as possible: Allergic reactions--skin rash, itching, hives, swelling of the face, lips, tongue, or throat Bleeding--bloody or black, tar-like stools, vomiting blood or brown material that looks like coffee grounds, red or dark brown urine, small red or purple spots on skin, unusual bruising or bleeding Blood clot--pain, swelling, or warmth in the leg,  shortness of breath, chest pain Heart attack--pain or tightness in the chest, shoulders, arms, or jaw, nausea, shortness of breath, cold or clammy skin, feeling faint or lightheaded Heart failure--shortness of breath, swelling of the ankles, feet, or hands, sudden weight gain, unusual weakness or fatigue Increase in blood pressure Infection--fever, chills, cough, sore throat, wounds that don't heal, pain or trouble when passing urine, general feeling of discomfort or being unwell Infusion reactions--chest pain, shortness of breath or trouble breathing, feeling faint or lightheaded Kidney injury--decrease in the amount of urine, swelling of the ankles, hands, or  feet Stomach pain that is severe, does not go away, or gets worse Stroke--sudden numbness or weakness of the face, arm, or leg, trouble speaking, confusion, trouble walking, loss of balance or coordination, dizziness, severe headache, change in vision Sudden and severe headache, confusion, change in vision, seizures, which may be signs of posterior reversible encephalopathy syndrome (PRES) Side effects that usually do not require medical attention (report to your care team if they continue or are bothersome): Back pain Change in taste Diarrhea Dry skin Increased tears Nosebleed This list may not describe all possible side effects. Call your doctor for medical advice about side effects. You may report side effects to FDA at 1-800-FDA-1088. Where should I keep my medication? This medication is given in a hospital or clinic. It will not be stored at home. NOTE: This sheet is a summary. It may not cover all possible information. If you have questions about this medicine, talk to your doctor, pharmacist, or health care provider.  2023 Elsevier/Gold Standard (2021-06-17 00:00:00)

## 2022-01-12 NOTE — Addendum Note (Signed)
Addended by: Ventura Sellers on: 01/12/2022 10:57 AM   Modules accepted: Orders

## 2022-01-12 NOTE — Progress Notes (Signed)
[facility] [place], [place] [address] [phone]   Interval Evaluation  Date of Service: 01/12/22 Patient Name: Jerry Richards Patient MRN: [id number] Patient DOB: [birth year] Provider: Ventura Sellers, MD  Identifying Statement:  Jerry Richards is a 51 y.o. male with right frontal glioblastoma   Oncologic History: Oncology History  High grade glioma not classifiable by WHO criteria ([facility])  05/16/2021 Surgery   Craniotomy, R parafalcine resection with Dr. Annette Stable; path is high grade glioma IDHwt   06/20/2021 - 06/20/2021 Chemotherapy   Patient is on Treatment Plan : BRAIN GLIOBLASTOMA Radiation Therapy With Concurrent Temozolomide 75 mg/m2 Daily Followed By Sequential Maintenance Temozolomide x 6-12 cycles     09/05/2021 - 12/17/2021 Chemotherapy   Completes 3 cycles of adjuvant 5-day Temodar   12/18/2021 Progression   Progression of disease #1   12/29/2021 -  Chemotherapy   Initiates second line therapy with CCNU 79m/m2 q6 weeks, and Avastin 164mkg IV q2 weeks   Glioblastoma multiforme of frontal lobe ([facility] 09/12/2021 Initial Diagnosis   Glioblastoma multiforme of frontal lobe ([facility]  12/29/2021 -  Chemotherapy   Patient is on Treatment Plan : BRAIN GBM Bevacizumab q14d       Biomarkers:  MGMT Unknown.  IDH 1/2 Wild type.  EGFR Unknown  TERT Unknown   Interval History: Jerry SCALESresents today for avastin infusion, after dosing CCNU on 12/29/21.  No clinical changes today.  Denies any recurrence of seizures, no headaches.  Leg symptoms are stable.  Continues to have some pain in the left leg as prior.  H+P (06/07/21): Patient presented to neurologic attention in March with several days of progressive left sided weakness and seizures, characterized by twitching of the left leg.  CNS imaging demonstrated a right frontal mass.  He underwent craniotomy and resection with Dr. PoAnnette Stablen 05/16/21; path demonstrated  high grade glioma IDHwt.  Following surgery his left leg was much weaker, though it has improved with aggressive rehab efforts.  Currently he is using a cane to ambulate.  He has no other complaints, continues on decadron 58m60mwice per day.  No seizures since surgery.  Medications: Current Outpatient Medications on File Prior to Visit  Medication Sig Dispense Refill   acetaminophen (TYLENOL) 500 MG tablet Take 1,000 mg by mouth every 6 (six) hours as needed for moderate pain.     albuterol (VENTOLIN HFA) 108 (90 Base) MCG/ACT inhaler Inhale 1 puff into the lungs in the morning, at noon, in the evening, and at bedtime. (Patient not taking: Reported on 11/14/2021)     levETIRAcetam (KEPPRA) 500 MG tablet TAKE 2 TABLETS(1000 MG) BY MOUTH TWICE DAILY 360 tablet 0   losartan (COZAAR) 50 MG tablet Take 50 mg by mouth daily.  6   mirtazapine (REMERON) 15 MG tablet Take 15 mg by mouth at bedtime.     ondansetron (ZOFRAN) 8 MG tablet Take 1 tablet (8 mg total) by mouth every 8 (eight) hours as needed for nausea or vomiting. 30 tablet 1   rivaroxaban (XARELTO) 20 MG TABS tablet Take 1 tablet (20 mg total) by mouth daily. 90 tablet 1   No current facility-administered medications on file prior to visit.    Allergies:  Allergies  Allergen Reactions   Amiodarone     AMIODARONE ANALOGUES Hyperthyroidism   Morphine And Related Palpitations    Chest pressure/palpitations   Past Medical History:  Past Medical History:  Diagnosis  Date   Anxiety    Atrial fibrillation [facility])    a. 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 MOST RECENT ECHO  (03/2012) EF= 66%   Past Surgical History:  Past Surgical History:  Procedure Laterality Date   APPLICATION OF CRANIAL NAVIGATION Right 05/16/2021   Procedure: APPLICATION OF CRANIAL NAVIGATION;  Surgeon: Earnie Larsson, MD;  Location: [facility];  Service: Neurosurgery;  Laterality: Right;   ATRIAL ABLATION SURGERY     CARDIAC CATHETERIZATION  08/2011   [facility]   CHOLECYSTECTOMY     CRANIOTOMY Right 05/16/2021   Procedure: Craniotomy - right - Parietal with  brainlab;  Surgeon: Earnie Larsson, MD;  Location: [facility];  Service: Neurosurgery;  Laterality: Right;   FINGER SURGERY     LEFT HEART CATHETERIZATION WITH CORONARY ANGIOGRAM N/A 09/12/2011   Procedure: LEFT HEART CATHETERIZATION WITH CORONARY ANGIOGRAM;  Surgeon: Hillary Bow, MD;  Location: [facility] CATH LAB;  Service: Cardiovascular;  Laterality: N/A;   VASECTOMY     Social History:  Social History   Socioeconomic History   Marital status: Married    Spouse name: tanya   Number of children: 2   Years of education: Not on file   Highest education level: Some college, no degree  Occupational History   Occupation: SELF EMPLOYED    Comment: Nurse, 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 Occas tea   Social Determinants of Health   Financial Resource Strain: Not on file  Food Insecurity: Not on file  Transportation Needs: Not on file  Physical Activity: Not on file  Stress: Not on file  Social Connections: Not on file  Intimate Partner Violence: Not on file   Family History:  Family  History  Problem Relation Age of Onset   Healthy Mother        age 26   Melanoma Mother    Melanoma Father    Atrial fibrillation Father        age 35   Diabetes Maternal Grandmother    Coronary artery disease Paternal Grandmother    Heart disease Paternal Grandmother    Heart attack Paternal Grandmother    Coronary artery disease Paternal Grandfather    Heart disease Paternal Grandfather    Heart attack Paternal Grandfather    Atrial fibrillation Paternal Aunt    Atrial fibrillation Paternal Uncle    Colon cancer Neg Hx  Esophageal cancer Neg Hx    Ovarian cancer Neg Hx    Stomach cancer Neg Hx    Rectal cancer Neg Hx     Review of Systems: Constitutional: Doesn't report fevers, chills or abnormal weight loss Eyes: Doesn't report blurriness of vision Ears, nose, mouth, throat, and face: Doesn't report sore throat Respiratory: Doesn't report cough, dyspnea or wheezes Cardiovascular: Doesn't report palpitation, chest discomfort  Gastrointestinal:  Doesn't report nausea, constipation, diarrhea GU: Doesn't report incontinence Skin: Doesn't report skin rashes Neurological: Per HPI Musculoskeletal: Doesn't report joint pain Behavioral/Psych: Doesn't report anxiety  Physical Exam: Vitals:   01/12/22 0948  BP: 124/73  Pulse: (!) 50  Resp: 16  Temp: 97.7 F (36.5 C)  SpO2: 100%   KPS: 80. General: Alert, cooperative, pleasant, in no acute distress Head: Normal EENT: No conjunctival injection or scleral icterus.  Lungs: Resp effort normal Cardiac: Regular rate Abdomen: Non-distended abdomen Skin: No rashes cyanosis or petechiae. Extremities: No clubbing or edema  Neurologic Exam: Mental Status: Awake, alert, attentive to examiner. Oriented to self and environment. Language is fluent with intact comprehension.  Cranial Nerves: Visual acuity is grossly normal. Visual fields are full. Extra-ocular movements intact. No ptosis. Face is symmetric Motor: Tone and bulk  are normal. Power is 4/5 in left leg, subtle drift in left arm. Reflexes are symmetric, no pathologic reflexes present.  Sensory: Intact to light touch Gait: Hemiparetic, cane assisted.   Labs: I have reviewed the data as listed    Component Value Date/Time   NA 141 12/29/2021 0838   NA 140 03/29/2017 1045   K 4.4 12/29/2021 0838   CL 109 12/29/2021 0838   CO2 27 12/29/2021 0838   GLUCOSE 90 12/29/2021 0838   BUN 19 12/29/2021 0838   BUN 18 03/29/2017 1045   CREATININE 1.41 (H) 12/29/2021 0838   CALCIUM 9.6 12/29/2021 0838   PROT 6.6 12/29/2021 0838   PROT 7.7 03/29/2017 1045   ALBUMIN 4.3 12/29/2021 0838   ALBUMIN 5.2 03/29/2017 1045   AST 12 (L) 12/29/2021 0838   ALT 11 12/29/2021 0838   ALKPHOS 51 12/29/2021 0838   BILITOT 0.7 12/29/2021 0838   GFRNONAA 60 (L) 12/29/2021 0838   GFRAA 56 (L) 03/29/2017 1045   Lab Results  Component Value Date   WBC 5.7 01/12/2022   NEUTROABS 4.0 01/12/2022   HGB 14.6 01/12/2022   HCT 40.8 01/12/2022   MCV 92.7 01/12/2022   PLT 141 (L) 01/12/2022   Imaging:  [facility] Clinician Interpretation: I have personally reviewed the CNS images as listed.  My interpretation, in the context of the patient's clinical presentation, is progressive disease  MR BRAIN W WO CONTRAST  Result Date: 12/18/2021 CLINICAL DATA:  High-grade glioma EXAM: MRI HEAD WITHOUT AND WITH CONTRAST TECHNIQUE: Multiplanar, multiecho pulse sequences of the brain and surrounding structures were obtained without and with intravenous contrast. CONTRAST:  14m GADAVIST GADOBUTROL 1 MMOL/ML IV SOLN COMPARISON:  11/07/2021 FINDINGS: Brain: 3 areas of infiltrative signal abnormality in the right cerebral hemisphere, seemingly separate but presumed microscopic continuity. Beneath the high right frontal craniotomy site is a necrotic enhancing lesion which shows mild increase, especially along the posterior extent, now up to 2.4 cm in maximal span as compared to 2.2 cm previously. A new  small adjacent focus of nodular enhancement is seen anteriorly. Mildly increased extent of regional T2 hyperintensity. Significant increase in T2 signal in the right periatrial white matter with 2 areas of heterogeneous enhancement, the larger measuring 16  mm, enhancement also progressed. Increased diffusion hyperintensity at the level of enhancement. Similar appearance of infiltrative T2 hyperintensity in the anterior right temporal lobe, no enhancement at this level. Developmental venous anomaly in the superior left frontal lobe. No infarct, hydrocephalus, or worrisome mass effect. Vascular: Major flow voids and vascular enhancements are preserved Skull and upper cervical spine: Normal marrow signal Sinuses/Orbits: Negative IMPRESSION: 1. Three areas of T2 hyperintensity in the right cerebral hemisphere compatible with multifocal glioma. 2. At the vertex, mild size progression of the enhancing lesion and increased T2 hyperintensity. 3. In the right periatrial white matter significant increase in T2 signal with 2 areas of necrotic pattern enhancement, consistent with progressive tumor. 4. Infiltrative appearance at the right anterior temporal lobe is nonenhancing and appears stable. Electronically Signed   By: Jorje Guild M.D.   On: 12/18/2021 11:40     Assessment/Plan High grade glioma not classifiable by WHO criteria ([facility])  Focal seizures ([facility])  [name] is clinically stable today.  Labs are within normal limits, he will proceed with avastin today.  Will con't day 15/42 CCNU 38m/m2 PO q6 weeks, in addition to avastin 155mkg IV q2 weeks.  Avastin will be utilized because of high growth rate and tumor location near eloquent motor structures.  We reviewed side effects of CCNU, including nausea/vomiting, cytopenias.  Effects of avastin could include hypertension, bleedin/clotting, wound healing impairment.  We will check blood counts every 2 weeks concurrent with avastin pre-treat visits.      Chemotherapy should be held for the following:  ANC less than 1,000  Platelets less than 100,000  LFT or creatinine greater than 2x ULN  If clinical concerns/contraindications develop  Avastin should be held for the following:  ANC less than 500  Platelets less than 50,000  LFT or creatinine greater than 2x ULN  If clinical concerns/contraindications develop  Keppra will remain at 100031mID for now.  We ask that [place] Park[place]urn to clinic in 2 weeks for next avastin infusion, or sooner as needed.  MRI will be scheduled for 12/7.  All questions were answered. The patient knows to call the clinic with any problems, questions or concerns. No barriers to learning were detected.  The total time spent in the encounter was 30 minutes and more than 50% was on counseling and review of test results   ZacVentura SellersD Medical Director of Neuro-Oncology [facility]/16/23 10:18 AM

## 2022-01-13 ENCOUNTER — Telehealth: Payer: Self-pay | Admitting: Internal Medicine

## 2022-01-13 NOTE — Telephone Encounter (Signed)
Per 11/16 los called and spoke to pt about appointment

## 2022-01-14 ENCOUNTER — Other Ambulatory Visit: Payer: Self-pay

## 2022-01-18 ENCOUNTER — Other Ambulatory Visit: Payer: Self-pay

## 2022-01-26 ENCOUNTER — Other Ambulatory Visit: Payer: Self-pay

## 2022-01-26 ENCOUNTER — Inpatient Hospital Stay: Payer: 59

## 2022-01-26 ENCOUNTER — Inpatient Hospital Stay (HOSPITAL_BASED_OUTPATIENT_CLINIC_OR_DEPARTMENT_OTHER): Payer: 59 | Admitting: Internal Medicine

## 2022-01-26 VITALS — BP 118/75 | HR 48 | Temp 98.0°F | Resp 16

## 2022-01-26 DIAGNOSIS — Z8249 Family history of ischemic heart disease and other diseases of the circulatory system: Secondary | ICD-10-CM | POA: Diagnosis not present

## 2022-01-26 DIAGNOSIS — Z79899 Other long term (current) drug therapy: Secondary | ICD-10-CM | POA: Diagnosis not present

## 2022-01-26 DIAGNOSIS — Z7901 Long term (current) use of anticoagulants: Secondary | ICD-10-CM | POA: Diagnosis not present

## 2022-01-26 DIAGNOSIS — Z5112 Encounter for antineoplastic immunotherapy: Secondary | ICD-10-CM | POA: Diagnosis not present

## 2022-01-26 DIAGNOSIS — M79605 Pain in left leg: Secondary | ICD-10-CM | POA: Diagnosis not present

## 2022-01-26 DIAGNOSIS — C711 Malignant neoplasm of frontal lobe: Secondary | ICD-10-CM

## 2022-01-26 DIAGNOSIS — R569 Unspecified convulsions: Secondary | ICD-10-CM | POA: Diagnosis not present

## 2022-01-26 DIAGNOSIS — D696 Thrombocytopenia, unspecified: Secondary | ICD-10-CM | POA: Diagnosis not present

## 2022-01-26 DIAGNOSIS — C719 Malignant neoplasm of brain, unspecified: Secondary | ICD-10-CM

## 2022-01-26 DIAGNOSIS — R531 Weakness: Secondary | ICD-10-CM | POA: Diagnosis not present

## 2022-01-26 DIAGNOSIS — Z885 Allergy status to narcotic agent status: Secondary | ICD-10-CM | POA: Diagnosis not present

## 2022-01-26 DIAGNOSIS — Z833 Family history of diabetes mellitus: Secondary | ICD-10-CM | POA: Diagnosis not present

## 2022-01-26 DIAGNOSIS — R69 Illness, unspecified: Secondary | ICD-10-CM | POA: Diagnosis not present

## 2022-01-26 DIAGNOSIS — Z808 Family history of malignant neoplasm of other organs or systems: Secondary | ICD-10-CM | POA: Diagnosis not present

## 2022-01-26 DIAGNOSIS — Z79630 Long term (current) use of alkylating agent: Secondary | ICD-10-CM | POA: Diagnosis not present

## 2022-01-26 DIAGNOSIS — Q283 Other malformations of cerebral vessels: Secondary | ICD-10-CM | POA: Diagnosis not present

## 2022-01-26 DIAGNOSIS — Z9049 Acquired absence of other specified parts of digestive tract: Secondary | ICD-10-CM | POA: Diagnosis not present

## 2022-01-26 LAB — CBC WITH DIFFERENTIAL (CANCER CENTER ONLY)
Abs Immature Granulocytes: 0.02 10*3/uL (ref 0.00–0.07)
Basophils Absolute: 0.1 10*3/uL (ref 0.0–0.1)
Basophils Relative: 1 %
Eosinophils Absolute: 0.2 10*3/uL (ref 0.0–0.5)
Eosinophils Relative: 2 %
HCT: 41.1 % (ref 39.0–52.0)
Hemoglobin: 14.6 g/dL (ref 13.0–17.0)
Immature Granulocytes: 0 %
Lymphocytes Relative: 16 %
Lymphs Abs: 1.1 10*3/uL (ref 0.7–4.0)
MCH: 34 pg (ref 26.0–34.0)
MCHC: 35.5 g/dL (ref 30.0–36.0)
MCV: 95.8 fL (ref 80.0–100.0)
Monocytes Absolute: 0.5 10*3/uL (ref 0.1–1.0)
Monocytes Relative: 7 %
Neutro Abs: 5.1 10*3/uL (ref 1.7–7.7)
Neutrophils Relative %: 74 %
Platelet Count: 126 10*3/uL — ABNORMAL LOW (ref 150–400)
RBC: 4.29 MIL/uL (ref 4.22–5.81)
RDW: 15.8 % — ABNORMAL HIGH (ref 11.5–15.5)
WBC Count: 6.8 10*3/uL (ref 4.0–10.5)
nRBC: 0 % (ref 0.0–0.2)

## 2022-01-26 LAB — CMP (CANCER CENTER ONLY)
ALT: 12 U/L (ref 0–44)
AST: 15 U/L (ref 15–41)
Albumin: 4.4 g/dL (ref 3.5–5.0)
Alkaline Phosphatase: 47 U/L (ref 38–126)
Anion gap: 6 (ref 5–15)
BUN: 24 mg/dL — ABNORMAL HIGH (ref 6–20)
CO2: 27 mmol/L (ref 22–32)
Calcium: 9.9 mg/dL (ref 8.9–10.3)
Chloride: 107 mmol/L (ref 98–111)
Creatinine: 1.4 mg/dL — ABNORMAL HIGH (ref 0.61–1.24)
GFR, Estimated: >60 mL/min (ref >60)
Glucose, Bld: 79 mg/dL (ref 70–99)
Potassium: 4.8 mmol/L (ref 3.5–5.1)
Sodium: 140 mmol/L (ref 135–145)
Total Bilirubin: 0.8 mg/dL (ref 0.3–1.2)
Total Protein: 6.8 g/dL (ref 6.5–8.1)

## 2022-01-26 LAB — TOTAL PROTEIN, URINE DIPSTICK: Protein, ur: NEGATIVE mg/dL

## 2022-01-26 MED ORDER — SODIUM CHLORIDE 0.9 % IV SOLN
10.0000 mg/kg | Freq: Once | INTRAVENOUS | Status: AC
  Administered 2022-01-26: 900 mg via INTRAVENOUS
  Filled 2022-01-26: qty 32

## 2022-01-26 MED ORDER — SODIUM CHLORIDE 0.9 % IV SOLN: Freq: Once | INTRAVENOUS | Status: AC

## 2022-01-26 NOTE — Progress Notes (Signed)
[facility] [place], [place] [address] [phone]   Interval Evaluation  Date of Service: 01/26/22 Patient Name: Jerry Richards Patient MRN: [id number] Patient DOB: 1970/07/18 Provider: Ventura Sellers, MD  Identifying Statement:  Jerry Richards is a 51 y.o. male with right frontal glioblastoma   Oncologic History: Oncology History  High grade glioma not classifiable by WHO criteria ([place])  05/16/2021 Surgery   Craniotomy, R parafalcine resection with Dr. Annette Stable; path is high grade glioma IDHwt   06/20/2021 - 06/20/2021 Chemotherapy   Patient is on Treatment Plan : BRAIN GLIOBLASTOMA Radiation Therapy With Concurrent Temozolomide 75 mg/m2 Daily Followed By Sequential Maintenance Temozolomide x 6-12 cycles     09/05/2021 - 12/17/2021 Chemotherapy   Completes 3 cycles of adjuvant 5-day Temodar   12/18/2021 Progression   Progression of disease #1   12/29/2021 -  Chemotherapy   Initiates second line therapy with CCNU 28m/m2 q6 weeks, and Avastin 18mkg IV q2 weeks   [facility]li[facility] ([facility] 09/12/2021 Initial Diagnosis   [facility]e of [place] ([facility]  12/29/2021 -  Chemotherapy   Patient is on Treatment Plan : BRAIN GBM Bevacizumab q14d       Biomarkers:  MGMT Unknown.  IDH 1/2 Wild type.  EGFR Unknown  TERT Unknown   Interval History: Jerry BALLENTINEresents today for avastin infusion, now in midst of first cycle of CCNU.  No clinical changes today.  Denies any recurrence of seizures, no headaches.  Leg symptoms are stable.  Continues to have some pain in the left leg as prior.  H+P (06/07/21): Patient presented to neurologic attention in March with several days of progressive left sided weakness and seizures, characterized by twitching of the left leg.  CNS imaging demonstrated a right frontal mass.  He underwent craniotomy and resection with Dr. PoAnnette Stablen 05/16/21; path  demonstrated high grade glioma IDHwt.  Following surgery his left leg was much weaker, though it has improved with aggressive rehab efforts.  Currently he is using a cane to ambulate.  He has no other complaints, continues on decadron 8m60mwice per day.  No seizures since surgery.  Medications: Current Outpatient Medications on File Prior to Visit  Medication Sig Dispense Refill   acetaminophen (TYLENOL) 500 MG tablet Take 1,000 mg by mouth every 6 (six) hours as needed for moderate pain.     albuterol (VENTOLIN HFA) 108 (90 Base) MCG/ACT inhaler Inhale 1 puff into the lungs in the morning, at noon, in the evening, and at bedtime. (Patient not taking: Reported on 11/14/2021)     levETIRAcetam (KEPPRA) 500 MG tablet TAKE 2 TABLETS(1000 MG) BY MOUTH TWICE DAILY 360 tablet 0   losartan (COZAAR) 50 MG tablet Take 50 mg by mouth daily.  6   mirtazapine (REMERON) 15 MG tablet Take 15 mg by mouth at bedtime.     ondansetron (ZOFRAN) 8 MG tablet Take 1 tablet (8 mg total) by mouth every 8 (eight) hours as needed for nausea or vomiting. 30 tablet 1   rivaroxaban (XARELTO) 20 MG TABS tablet Take 1 tablet (20 mg total) by mouth daily. 90 tablet 1   No current facility-administered medications on file prior to visit.    Allergies:  Allergies  Allergen Reactions   Amiodarone     AMIODARONE ANALOGUES Hyperthyroidism   Morphine And Related Palpitations    Chest pressure/palpitations   Past Medical History:  Past Medical  History:  Diagnosis Date   Anxiety    Atrial fibrillation ([place])    a. 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 MOST  RECENT ECHO (03/2012) EF= 66%   Past Surgical History:  Past Surgical History:  Procedure Laterality Date   APPLICATION OF CRANIAL NAVIGATION Right 05/16/2021   Procedure: APPLICATION OF CRANIAL NAVIGATION;  Surgeon: Earnie Larsson, MD;  Location: [facility];  Service: Neurosurgery;  Laterality: Right;   ATRIAL ABLATION SURGERY     CARDIAC CATHETERIZATION  08/2011   [facility]   CHOLECYSTECTOMY     CRANIOTOMY Right 05/16/2021   Procedure: Craniotomy - right - Parietal with  brainlab;  Surgeon: Earnie Larsson, MD;  Location: [facility];  Service: Neurosurgery;  Laterality: Right;   FINGER SURGERY     LEFT HEART CATHETERIZATION WITH CORONARY ANGIOGRAM N/A 09/12/2011   Procedure: LEFT HEART CATHETERIZATION WITH CORONARY ANGIOGRAM;  Surgeon: Hillary Bow, MD;  Location[facility] C[facility];  Service: Cardiovascular;  Laterality: N/A;   VASECTOMY     Social History:  Social History   Socioeconomic History   Marital status: Married    Spouse name: tanya   Number of children: 2   Years of education: Not on file   Highest education level: Some college, no degree  Occupational History   Occupation: SELF EMPLOYED    Comment: Nurse, 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 Occas tea   Social Determinants of Health   Financial Resource Strain: Not on file  Food Insecurity: Not on file  Transportation Needs: Not on file  Physical Activity: Not on file  Stress: Not on file  Social Connections: Not on file  Intimate Partner Violence: Not on file   Family  History:  Family History  Problem Relation Age of Onset   Healthy Mother        age 69   Melanoma Mother    Melanoma Father    Atrial fibrillation Father        age 54   Diabetes Maternal Grandmother    Coronary artery disease Paternal Grandmother    Heart disease Paternal Grandmother    Heart attack Paternal Grandmother    Coronary artery disease Paternal Grandfather    Heart disease Paternal Grandfather    Heart attack Paternal Grandfather    Atrial fibrillation Paternal Aunt    Atrial fibrillation Paternal Uncle    Colon  cancer Neg Hx    Esophageal cancer Neg Hx    Ovarian cancer Neg Hx    Stomach cancer Neg Hx    Rectal cancer Neg Hx     Review of Systems: Constitutional: Doesn't report fevers, chills or abnormal weight loss Eyes: Doesn't report blurriness of vision Ears, nose, mouth, throat, and face: Doesn't report sore throat Respiratory: Doesn't report cough, dyspnea or wheezes Cardiovascular: Doesn't report palpitation, chest discomfort  Gastrointestinal:  Doesn't report nausea, constipation, diarrhea GU: Doesn't report incontinence Skin: Doesn't report skin rashes Neurological: Per HPI Musculoskeletal: Doesn't report joint pain Behavioral/Psych: Doesn't report anxiety  Physical Exam: Vitals:   01/26/22 0940  BP: 110/75  Pulse: (!) 47  Resp: 16  Temp: (!) 97.3 F (36.3 C)  SpO2: 100%   KPS: 80. General: Alert, cooperative, pleasant, in no acute distress Head: Normal EENT: No conjunctival injection or scleral icterus.  Lungs: Resp effort normal Cardiac: Regular rate Abdomen: Non-distended abdomen Skin: No rashes cyanosis or petechiae. Extremities: No clubbing or edema  Neurologic Exam: Mental Status: Awake, alert, attentive to examiner. Oriented to self and environment. Language is fluent with intact comprehension.  Cranial Nerves: Visual acuity is grossly normal. Visual fields are full. Extra-ocular movements intact. No ptosis. Face is  symmetric Motor: Tone and bulk are normal. Power is 4/5 in left leg, subtle drift in left arm. Reflexes are symmetric, no pathologic reflexes present.  Sensory: Intact to light touch Gait: Hemiparetic, cane assisted.   Labs: I have reviewed the data as listed    Component Value Date/Time   NA 140 01/12/2022 0945   NA 140 03/29/2017 1045   K 4.4 01/12/2022 0945   CL 108 01/12/2022 0945   CO2 25 01/12/2022 0945   GLUCOSE 96 01/12/2022 0945   BUN 21 (H) 01/12/2022 0945   BUN 18 03/29/2017 1045   CREATININE 1.29 (H) 01/12/2022 0945   CALCIUM 9.6 01/12/2022 0945   PROT 6.9 01/12/2022 0945   PROT 7.7 03/29/2017 1045   ALBUMIN 4.4 01/12/2022 0945   ALBUMIN 5.2 03/29/2017 1045   AST 13 (L) 01/12/2022 0945   ALT 9 01/12/2022 0945   ALKPHOS 48 01/12/2022 0945   BILITOT 0.8 01/12/2022 0945   GFRNONAA >60 01/12/2022 0945   GFRAA 56 (L) 03/29/2017 1045   Lab Results  Component Value Date   WBC 5.7 01/12/2022   NEUTROABS 4.0 01/12/2022   HGB 14.6 01/12/2022   HCT 40.8 01/12/2022   MCV 92.7 01/12/2022   PLT 141 (L) 01/12/2022     Assessment/Plan Glioblastoma multiforme of frontal lobe [facility]) - Plan: Clinic Appointment Request  Jerry Richards is clinically stable today.  No new or progressive changes, labs are within normal limits.  Will con't day 29/42 CCNU 60m/m2 PO q6 weeks, in addition to avastin 123mkg IV q2 weeks.  Avastin will be utilized because of high growth rate and tumor location near eloquent motor structures.  We reviewed side effects of CCNU, including nausea/vomiting, cytopenias.  Effects of avastin could include hypertension, bleedin/clotting, wound healing impairment.  We will check blood counts every 2 weeks concurrent with avastin pre-treat visits.     Chemotherapy should be held for the following:  ANC less than 1,000  Platelets less than 100,000  LFT or creatinine greater than 2x ULN  If clinical concerns/contraindications develop  Avastin should be  held for the following:  ANC less than 500  Platelets less than 50,000  LFT or creatinine greater than 2x ULN  If clinical  concerns/contraindications develop  Keppra will remain at 1038m BID for now, though we did discuss transition to Lamictal given mood effects.  We ask that WCorrin Parkerreturn to clinic in 2 weeks for next avastin infusion, or sooner as needed.  MRI will be scheduled for 12/7.  All questions were answered. The patient knows to call the clinic with any problems, questions or concerns. No barriers to learning were detected.  The total time spent in the encounter was 30 minutes and more than 50% was on counseling and review of test results   ZVentura Sellers MD Medical Director of Neuro-Oncology [facility]11/30/23 9:42 AM

## 2022-01-27 ENCOUNTER — Other Ambulatory Visit: Payer: Self-pay | Admitting: Radiation Therapy

## 2022-01-27 ENCOUNTER — Telehealth: Payer: Self-pay | Admitting: Internal Medicine

## 2022-01-27 NOTE — Telephone Encounter (Signed)
Per 11/30 los called and left message for pt about appointment

## 2022-01-28 ENCOUNTER — Other Ambulatory Visit: Payer: Self-pay

## 2022-01-30 ENCOUNTER — Other Ambulatory Visit: Payer: Self-pay

## 2022-01-30 ENCOUNTER — Encounter: Payer: Self-pay | Admitting: Internal Medicine

## 2022-02-01 ENCOUNTER — Encounter: Payer: Self-pay | Admitting: Internal Medicine

## 2022-02-02 ENCOUNTER — Ambulatory Visit (HOSPITAL_COMMUNITY)
Admission: RE | Admit: 2022-02-02 | Discharge: 2022-02-02 | Disposition: A | Discharge status: Home / Self Care | Payer: 59 | Source: Ambulatory Visit | Attending: Internal Medicine | Admitting: Internal Medicine

## 2022-02-02 DIAGNOSIS — C719 Malignant neoplasm of brain, unspecified: Secondary | ICD-10-CM | POA: Insufficient documentation

## 2022-02-02 MED ORDER — GADOBUTROL 1 MMOL/ML IV SOLN
10.0000 mL | Freq: Once | INTRAVENOUS | Status: AC | PRN
  Administered 2022-02-02: 10 mL via INTRAVENOUS

## 2022-02-06 ENCOUNTER — Inpatient Hospital Stay: Payer: 59 | Attending: Neurosurgery

## 2022-02-06 DIAGNOSIS — I4891 Unspecified atrial fibrillation: Secondary | ICD-10-CM | POA: Insufficient documentation

## 2022-02-06 DIAGNOSIS — Z79899 Other long term (current) drug therapy: Secondary | ICD-10-CM | POA: Insufficient documentation

## 2022-02-06 DIAGNOSIS — F17210 Nicotine dependence, cigarettes, uncomplicated: Secondary | ICD-10-CM | POA: Insufficient documentation

## 2022-02-06 DIAGNOSIS — Z5112 Encounter for antineoplastic immunotherapy: Secondary | ICD-10-CM | POA: Insufficient documentation

## 2022-02-06 DIAGNOSIS — Z808 Family history of malignant neoplasm of other organs or systems: Secondary | ICD-10-CM | POA: Insufficient documentation

## 2022-02-06 DIAGNOSIS — G4733 Obstructive sleep apnea (adult) (pediatric): Secondary | ICD-10-CM | POA: Insufficient documentation

## 2022-02-06 DIAGNOSIS — Z9049 Acquired absence of other specified parts of digestive tract: Secondary | ICD-10-CM | POA: Insufficient documentation

## 2022-02-06 DIAGNOSIS — Z79630 Long term (current) use of alkylating agent: Secondary | ICD-10-CM | POA: Insufficient documentation

## 2022-02-06 DIAGNOSIS — C711 Malignant neoplasm of frontal lobe: Secondary | ICD-10-CM | POA: Insufficient documentation

## 2022-02-06 DIAGNOSIS — Z8249 Family history of ischemic heart disease and other diseases of the circulatory system: Secondary | ICD-10-CM | POA: Insufficient documentation

## 2022-02-06 DIAGNOSIS — Z833 Family history of diabetes mellitus: Secondary | ICD-10-CM | POA: Insufficient documentation

## 2022-02-06 DIAGNOSIS — Z7901 Long term (current) use of anticoagulants: Secondary | ICD-10-CM | POA: Insufficient documentation

## 2022-02-06 DIAGNOSIS — Z885 Allergy status to narcotic agent status: Secondary | ICD-10-CM | POA: Insufficient documentation

## 2022-02-06 DIAGNOSIS — I10 Essential (primary) hypertension: Secondary | ICD-10-CM | POA: Insufficient documentation

## 2022-02-06 DIAGNOSIS — R569 Unspecified convulsions: Secondary | ICD-10-CM | POA: Insufficient documentation

## 2022-02-06 DIAGNOSIS — M79605 Pain in left leg: Secondary | ICD-10-CM | POA: Insufficient documentation

## 2022-02-06 DIAGNOSIS — R531 Weakness: Secondary | ICD-10-CM | POA: Insufficient documentation

## 2022-02-08 ENCOUNTER — Other Ambulatory Visit (HOSPITAL_COMMUNITY): Payer: Self-pay

## 2022-02-08 ENCOUNTER — Encounter: Payer: Self-pay | Admitting: Internal Medicine

## 2022-02-09 ENCOUNTER — Inpatient Hospital Stay: Payer: 59

## 2022-02-09 ENCOUNTER — Inpatient Hospital Stay (HOSPITAL_BASED_OUTPATIENT_CLINIC_OR_DEPARTMENT_OTHER): Payer: 59 | Admitting: Internal Medicine

## 2022-02-09 ENCOUNTER — Encounter: Payer: Self-pay | Admitting: Internal Medicine

## 2022-02-09 VITALS — HR 57

## 2022-02-09 VITALS — BP 122/76 | HR 49 | Temp 97.8°F | Resp 17 | Ht 72.0 in | Wt 207.7 lb

## 2022-02-09 DIAGNOSIS — C719 Malignant neoplasm of brain, unspecified: Secondary | ICD-10-CM

## 2022-02-09 DIAGNOSIS — C711 Malignant neoplasm of frontal lobe: Secondary | ICD-10-CM

## 2022-02-09 DIAGNOSIS — F17210 Nicotine dependence, cigarettes, uncomplicated: Secondary | ICD-10-CM | POA: Diagnosis not present

## 2022-02-09 DIAGNOSIS — Z885 Allergy status to narcotic agent status: Secondary | ICD-10-CM | POA: Diagnosis not present

## 2022-02-09 DIAGNOSIS — Z833 Family history of diabetes mellitus: Secondary | ICD-10-CM | POA: Diagnosis not present

## 2022-02-09 DIAGNOSIS — Z808 Family history of malignant neoplasm of other organs or systems: Secondary | ICD-10-CM | POA: Diagnosis not present

## 2022-02-09 DIAGNOSIS — Z5112 Encounter for antineoplastic immunotherapy: Secondary | ICD-10-CM | POA: Diagnosis present

## 2022-02-09 DIAGNOSIS — I10 Essential (primary) hypertension: Secondary | ICD-10-CM | POA: Diagnosis not present

## 2022-02-09 DIAGNOSIS — G4733 Obstructive sleep apnea (adult) (pediatric): Secondary | ICD-10-CM | POA: Diagnosis not present

## 2022-02-09 DIAGNOSIS — R569 Unspecified convulsions: Secondary | ICD-10-CM | POA: Diagnosis not present

## 2022-02-09 DIAGNOSIS — M79605 Pain in left leg: Secondary | ICD-10-CM | POA: Diagnosis not present

## 2022-02-09 DIAGNOSIS — R531 Weakness: Secondary | ICD-10-CM | POA: Diagnosis not present

## 2022-02-09 DIAGNOSIS — I4891 Unspecified atrial fibrillation: Secondary | ICD-10-CM | POA: Diagnosis not present

## 2022-02-09 DIAGNOSIS — Z79899 Other long term (current) drug therapy: Secondary | ICD-10-CM | POA: Diagnosis not present

## 2022-02-09 DIAGNOSIS — Z79630 Long term (current) use of alkylating agent: Secondary | ICD-10-CM | POA: Diagnosis not present

## 2022-02-09 DIAGNOSIS — Z7901 Long term (current) use of anticoagulants: Secondary | ICD-10-CM | POA: Diagnosis not present

## 2022-02-09 DIAGNOSIS — Z9049 Acquired absence of other specified parts of digestive tract: Secondary | ICD-10-CM | POA: Diagnosis not present

## 2022-02-09 DIAGNOSIS — Z8249 Family history of ischemic heart disease and other diseases of the circulatory system: Secondary | ICD-10-CM | POA: Diagnosis not present

## 2022-02-09 LAB — CMP (CANCER CENTER ONLY)
ALT: 11 U/L (ref 0–44)
AST: 15 U/L (ref 15–41)
Albumin: 4.5 g/dL (ref 3.5–5.0)
Alkaline Phosphatase: 52 U/L (ref 38–126)
Anion gap: 5 (ref 5–15)
BUN: 25 mg/dL — ABNORMAL HIGH (ref 6–20)
CO2: 29 mmol/L (ref 22–32)
Calcium: 10.2 mg/dL (ref 8.9–10.3)
Chloride: 106 mmol/L (ref 98–111)
Creatinine: 1.31 mg/dL — ABNORMAL HIGH (ref 0.61–1.24)
GFR, Estimated: >60 mL/min (ref >60)
Glucose, Bld: 93 mg/dL (ref 70–99)
Potassium: 4.6 mmol/L (ref 3.5–5.1)
Sodium: 140 mmol/L (ref 135–145)
Total Bilirubin: 0.7 mg/dL (ref 0.3–1.2)
Total Protein: 6.7 g/dL (ref 6.5–8.1)

## 2022-02-09 LAB — CBC WITH DIFFERENTIAL (CANCER CENTER ONLY)
Abs Immature Granulocytes: 0.02 10*3/uL (ref 0.00–0.07)
Basophils Absolute: 0 10*3/uL (ref 0.0–0.1)
Basophils Relative: 1 %
Eosinophils Absolute: 0.2 10*3/uL (ref 0.0–0.5)
Eosinophils Relative: 3 %
HCT: 38.7 % — ABNORMAL LOW (ref 39.0–52.0)
Hemoglobin: 14 g/dL (ref 13.0–17.0)
Immature Granulocytes: 0 %
Lymphocytes Relative: 20 %
Lymphs Abs: 1.1 10*3/uL (ref 0.7–4.0)
MCH: 34.9 pg — ABNORMAL HIGH (ref 26.0–34.0)
MCHC: 36.2 g/dL — ABNORMAL HIGH (ref 30.0–36.0)
MCV: 96.5 fL (ref 80.0–100.0)
Monocytes Absolute: 0.3 10*3/uL (ref 0.1–1.0)
Monocytes Relative: 6 %
Neutro Abs: 3.7 10*3/uL (ref 1.7–7.7)
Neutrophils Relative %: 70 %
Platelet Count: 184 10*3/uL (ref 150–400)
RBC: 4.01 MIL/uL — ABNORMAL LOW (ref 4.22–5.81)
RDW: 15.9 % — ABNORMAL HIGH (ref 11.5–15.5)
WBC Count: 5.3 10*3/uL (ref 4.0–10.5)
nRBC: 0 % (ref 0.0–0.2)

## 2022-02-09 MED ORDER — SODIUM CHLORIDE 0.9 % IV SOLN: Freq: Once | INTRAVENOUS | Status: AC

## 2022-02-09 MED ORDER — LOMUSTINE 100 MG PO CAPS: 200.0000 mg | ORAL_CAPSULE | Freq: Once | ORAL | 0 refills | Status: AC

## 2022-02-09 MED ORDER — SODIUM CHLORIDE 0.9 % IV SOLN
10.0000 mg/kg | Freq: Once | INTRAVENOUS | Status: AC
  Administered 2022-02-09: 900 mg via INTRAVENOUS
  Filled 2022-02-09: qty 32

## 2022-02-09 NOTE — Patient Instructions (Signed)
[facility] ONCOLOGY  Discharge Instructions: Thank you for choosing [facility] to provide your oncology and hematology care.   If you have a lab appointment with the [facility], please go directly to the [facility] and check in at the registration area.   Wear comfortable clothing and clothing appropriate for easy access to any Portacath or PICC line.   We strive to give you quality time with your provider. You may need to reschedule your appointment if you arrive late (15 or more minutes).  Arriving late affects you and other patients whose appointments are after yours.  Also, if you miss three or more appointments without notifying the office, you may be dismissed from the clinic at the provider's discretion.      For prescription refill requests, have your pharmacy contact our office and allow 72 hours for refills to be completed.    Today you received the following chemotherapy and/or immunotherapy agents: MVASI      To help prevent nausea and vomiting after your treatment, we encourage you to take your nausea medication as directed.  BELOW ARE SYMPTOMS THAT SHOULD BE REPORTED IMMEDIATELY: *FEVER GREATER THAN 100.4 F (38 C) OR HIGHER *CHILLS OR SWEATING *NAUSEA AND VOMITING THAT IS NOT CONTROLLED WITH YOUR NAUSEA MEDICATION *UNUSUAL SHORTNESS OF BREATH *UNUSUAL BRUISING OR BLEEDING *URINARY PROBLEMS (pain or burning when urinating, or frequent urination) *BOWEL PROBLEMS (unusual diarrhea, constipation, pain near the anus) TENDERNESS IN MOUTH AND THROAT WITH OR WITHOUT PRESENCE OF ULCERS (sore throat, sores in mouth, or a toothache) UNUSUAL RASH, SWELLING OR PAIN  UNUSUAL VAGINAL DISCHARGE OR ITCHING   Items with * indicate a potential emergency and should be followed up as soon as possible or go to the Emergency Department if any problems should occur.  Please show the CHEMOTHERAPY ALERT CARD or IMMUNOTHERAPY ALERT CARD at check-in to the  Emergency Department and triage nurse.  Should you have questions after your visit or need to cancel or reschedule your appointment, please contact [facility]  Dept: [phone]  and follow the prompts.  Office hours are 8:00 a.m. to 4:30 p.m. Monday - Friday. Please note that voicemails left after 4:00 p.m. may not be returned until the following business day.  We are closed weekends and major holidays. You have access to a nurse at all times for urgent questions. Please call the main number to the clinic Dept: [phone] and follow the prompts.   For any non-urgent questions, you may also contact your provider using MyChart. We now offer e-Visits for anyone 42 and older to request care online for non-urgent symptoms. For details visit [email or web address].   Also download the MyChart app! Go to the app store, search "MyChart", open the app, select [facility], and log in with your MyChart username and password.  Masks are optional in the cancer centers. If you would like for your care team to wear a mask while they are taking care of you, please let them know. You may have one support person who is at least 53 years old accompany you for your appointments.

## 2022-02-09 NOTE — Progress Notes (Signed)
[facility] at [facility] [place], [place] [address] [phone]   Interval Evaluation  Date of Service: 02/09/22 Patient Name: Jerry Richards Patient MRN: [id number] Patient DOB: 11/28/1970 Provider: Ventura Sellers, MD  Identifying Statement:  Jerry Richards is a 51 y.o. male with right frontal glioblastoma   Oncologic History: Oncology History  High grade glioma not classifiable by WHO criteria ([place])  05/16/2021 Surgery   Craniotomy, R parafalcine resection with Dr. Annette Stable; path is high grade glioma IDHwt   06/20/2021 - 06/20/2021 Chemotherapy   Patient is on Treatment Plan : BRAIN GLIOBLASTOMA Radiation Therapy With Concurrent Temozolomide 75 mg/m2 Daily Followed By Sequential Maintenance Temozolomide x 6-12 cycles     09/05/2021 - 12/17/2021 Chemotherapy   Completes 3 cycles of adjuvant 5-day Temodar   12/18/2021 Progression   Progression of disease #1   12/29/2021 -  Chemotherapy   Initiates second line therapy with CCNU 11m/m2 q6 weeks, and Avastin 154mkg IV q2 weeks   Glioblastoma multiforme of frontal lobe ([facility] 09/12/2021 Initial Diagnosis   Glioblastoma multiforme of frontal lobe ([facility]  12/29/2021 -  Chemotherapy   Patient is on Treatment Plan : BRAIN GBM Bevacizumab q14d       Biomarkers:  MGMT Unknown.  IDH 1/2 Wild type.  EGFR Unknown  TERT Unknown   Interval History: WaPREVIN JIANresents today having completed recent MRI brain following first 6-week cycle of CCNU.  No clinical changes today.  Denies any recurrence of seizures, no headaches.  Leg symptoms are stable.  Continues to have some pain in the left leg as prior.  H+P (06/07/21): Patient presented to neurologic attention in March with several days of progressive left sided weakness and seizures, characterized by twitching of the left leg.  CNS imaging demonstrated a right frontal mass.  He underwent craniotomy and resection with Dr. PoAnnette Stablen 05/16/21;  path demonstrated high grade glioma IDHwt.  Following surgery his left leg was much weaker, though it has improved with aggressive rehab efforts.  Currently he is using a cane to ambulate.  He has no other complaints, continues on decadron 29m29mwice per day.  No seizures since surgery.  Medications: Current Outpatient Medications on File Prior to Visit  Medication Sig Dispense Refill   acetaminophen (TYLENOL) 500 MG tablet Take 1,000 mg by mouth every 6 (six) hours as needed for moderate pain.     levETIRAcetam (KEPPRA) 500 MG tablet TAKE 2 TABLETS(1000 MG) BY MOUTH TWICE DAILY 360 tablet 0   losartan (COZAAR) 50 MG tablet Take 50 mg by mouth daily.  6   mirtazapine (REMERON) 15 MG tablet Take 15 mg by mouth at bedtime.     ondansetron (ZOFRAN) 8 MG tablet Take 1 tablet (8 mg total) by mouth every 8 (eight) hours as needed for nausea or vomiting. 30 tablet 1   rivaroxaban (XARELTO) 20 MG TABS tablet Take 1 tablet (20 mg total) by mouth daily. 90 tablet 1   albuterol (VENTOLIN HFA) 108 (90 Base) MCG/ACT inhaler Inhale 1 puff into the lungs in the morning, at noon, in the evening, and at bedtime. (Patient not taking: Reported on 11/14/2021)     No current facility-administered medications on file prior to visit.    Allergies:  Allergies  Allergen Reactions   Amiodarone     AMIODARONE ANALOGUES Hyperthyroidism   Morphine And Related Palpitations    Chest pressure/palpitations   Past Medical History:  Past Medical  History:  Diagnosis Date   Anxiety    Atrial fibrillation ([place])    a. s/p RFCA x 2 (10/26/2011, 04/15/2012)   Atrial flutter (HCC)    Chronic kidney disease    Clotting disorder ([place])    Colitis    Current smoker    Diverticulitis    Finger near amputation, left    Hyperthyroidism    Secondary to amiodarone   LA thrombus    Non-ischemic cardiomyopathy ([place])    OSA on CPAP    Sinus trouble    Sleep apnea    Systolic heart failure (HCC)    EF 20-25%-> 50-55% in 10/2011.  MOST RECENT ECHO (03/2012) EF= 66%   Past Surgical History:  Past Surgical History:  Procedure Laterality Date   APPLICATION OF CRANIAL NAVIGATION Right 05/16/2021   Procedure: APPLICATION OF CRANIAL NAVIGATION;  Surgeon: Earnie Larsson, MD;  Location: [facility];  Service: Neurosurgery;  Laterality: Right;   ATRIAL ABLATION SURGERY     CARDIAC CATHETERIZATION  08/2011   [facility]   CHOLECYSTECTOMY     CRANIOTOMY Right 05/16/2021   Procedure: Craniotomy - right - Parietal with  brainlab;  Surgeon: Earnie Larsson, MD;  Location: [facility];  Service: Neurosurgery;  Laterality: Right;   FINGER SURGERY     LEFT HEART CATHETERIZATION WITH CORONARY ANGIOGRAM N/A 09/12/2011   Procedure: LEFT HEART CATHETERIZATION WITH CORONARY ANGIOGRAM;  Surgeon: Hillary Bow, MD;  Location: [facility] CATH LAB;  Service: Cardiovascular;  Laterality: N/A;   VASECTOMY     Social History:  Social History   Socioeconomic History   Marital status: Married    Spouse name: tanya   Number of children: 2   Years of education: Not on file   Highest education level: Some college, no degree  Occupational History   Occupation: SELF EMPLOYED    Comment: Nurse, 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 Occas tea   Social Determinants of Health   Financial Resource Strain: Not on file  Food Insecurity: Not on file  Transportation Needs: Not on file  Physical Activity: Not on file  Stress: Not on file  Social Connections: Not on file  Intimate Partner Violence: Not on file   Family  History:  Family History  Problem Relation Age of Onset   Healthy Mother        age 49   Melanoma Mother    Melanoma Father    Atrial fibrillation Father        age 32   Diabetes Maternal Grandmother    Coronary artery disease Paternal Grandmother    Heart disease Paternal Grandmother    Heart attack Paternal Grandmother    Coronary artery disease Paternal Grandfather    Heart disease Paternal Grandfather    Heart attack Paternal Grandfather    Atrial fibrillation Paternal Aunt    Atrial fibrillation Paternal Uncle    Colon  cancer Neg Hx    Esophageal cancer Neg Hx    Ovarian cancer Neg Hx    Stomach cancer Neg Hx    Rectal cancer Neg Hx     Review of Systems: Constitutional: Doesn't report fevers, chills or abnormal weight loss Eyes: Doesn't report blurriness of vision Ears, nose, mouth, throat, and face: Doesn't report sore throat Respiratory: Doesn't report cough, dyspnea or wheezes Cardiovascular: Doesn't report palpitation, chest discomfort  Gastrointestinal:  Doesn't report nausea, constipation, diarrhea GU: Doesn't report incontinence Skin: Doesn't report skin rashes Neurological: Per HPI Musculoskeletal: Doesn't report joint pain Behavioral/Psych: Doesn't report anxiety  Physical Exam: Vitals:   02/09/22 1017  BP: 122/76  Pulse: (!) 49  Resp: 17  Temp: 97.8 F (36.6 C)  SpO2: 100%   KPS: 80. General: Alert, cooperative, pleasant, in no acute distress Head: Normal EENT: No conjunctival injection or scleral icterus.  Lungs: Resp effort normal Cardiac: Regular rate Abdomen: Non-distended abdomen Skin: No rashes cyanosis or petechiae. Extremities: No clubbing or edema  Neurologic Exam: Mental Status: Awake, alert, attentive to examiner. Oriented to self and environment. Language is fluent with intact comprehension.  Cranial Nerves: Visual acuity is grossly normal. Visual fields are full. Extra-ocular movements intact. No ptosis. Face is  symmetric Motor: Tone and bulk are normal. Power is 4/5 in left leg, subtle drift in left arm. Reflexes are symmetric, no pathologic reflexes present.  Sensory: Intact to light touch Gait: Hemiparetic, cane assisted.   Labs: I have reviewed the data as listed    Component Value Date/Time   NA 140 02/09/2022 0957   NA 140 03/29/2017 1045   K 4.6 02/09/2022 0957   CL 106 02/09/2022 0957   CO2 29 02/09/2022 0957   GLUCOSE 93 02/09/2022 0957   BUN 25 (H) 02/09/2022 0957   BUN 18 03/29/2017 1045   CREATININE 1.31 (H) 02/09/2022 0957   CALCIUM 10.2 02/09/2022 0957   PROT 6.7 02/09/2022 0957   PROT 7.7 03/29/2017 1045   ALBUMIN 4.5 02/09/2022 0957   ALBUMIN 5.2 03/29/2017 1045   AST 15 02/09/2022 0957   ALT 11 02/09/2022 0957   ALKPHOS 52 02/09/2022 0957   BILITOT 0.7 02/09/2022 0957   GFRNONAA >60 02/09/2022 0957   GFRAA 56 (L) 03/29/2017 1045   Lab Results  Component Value Date   WBC 5.3 02/09/2022   NEUTROABS 3.7 02/09/2022   HGB 14.0 02/09/2022   HCT 38.7 (L) 02/09/2022   MCV 96.5 02/09/2022   PLT 184 02/09/2022   Imaging:  [facility] Clinician Interpretation: I have personally reviewed the CNS images as listed.  My interpretation, in the context of the patient's clinical presentation, is stable disease  MR BRAIN W WO CONTRAST  Result Date: 02/03/2022 CLINICAL DATA:  High-grade glioma, assess treatment response. EXAM: MRI HEAD WITHOUT AND WITH CONTRAST TECHNIQUE: Multiplanar, multiecho pulse sequences of the brain and surrounding structures were obtained without and with intravenous contrast. CONTRAST:  30m GADAVIST GADOBUTROL 1 MMOL/ML IV SOLN COMPARISON:  Brain MRI 12/16/2021 FINDINGS: Brain: Postsurgical changes reflecting right parietal craniotomy for mass resection are again seen. A small amount of blood products are noted at the surgical site. Previously seen enhancement at the periphery of the resection cavity has significantly decreased. Currently, there is a 6 mm focus  of nodular enhancement superiorly, and a punctate focus of enhancement anterolaterally (16-139, 16-137). A previously seen focus of satellite nodular enhancement along the anterior and medial aspect of the resection cavity has resolved. Diffusion restriction at the  periphery of the cavity has increased (5-43). There is decreased though not resolved FLAIR signal abnormality surrounding the resection site there is no residual mass effect. There is no midline shift. Peripheral enhancement around the additional lesion in the right peritrigonal white matter has also significantly decreased. FLAIR signal abnormality in this region is not significantly changed; however, diffusion restriction involving this lesion has significantly increased (5-32). Nonenhancing expansile FLAIR signal abnormality in the right anterior temporal lobe is unchanged (9-18). There is no diffusion restriction at this site. There is no acute intracranial hemorrhage, extra-axial fluid collection, or acute infarct. Parenchymal volume is stable. The ventricles are stable in size. A left frontal developmental venous anomaly is unchanged. Vascular: Normal flow voids. Skull and upper cervical spine: Normal marrow signal. Sinuses/Orbits: The paranasal sinuses are clear. The globes and orbits are unremarkable. Other: None. IMPRESSION: 1. Significantly decreased enhancement and surrounding FLAIR signal abnormality involving the right frontal lesion near the vertex, but increased peripheral diffusion restriction. 2. Decreased enhancement surrounding the lesion in the right peritrigonal white matter but with increased diffusion restriction and unchanged FLAIR signal abnormality. The decreased enhancement at these sites could be in part related to Avastin therapy. 3. Unchanged nonenhancing expansile FLAIR signal abnormality in the right anterior temporal lobe. Electronically Signed   By: Valetta Mole M.D.   On: 02/03/2022 12:00     Assessment/Plan Glioblastoma multiforme of frontal lobe ([place]) - Plan: Infusion Appointment Request, Clinic Appointment Request, Lab Appointment Request, CBC with Differential ([facility] Only)  High grade glioma not classifiable by WHO criteria ([place]) - Plan: lomustine (GLEOSTINE) 100 MG capsule  Jerry Richards is clinically stable today, now having completed cycle #1 of CCNU/avastin.  MRI brain fortunately demonstrates stable findings in response to this regimen.  Will con't with cycle #2 CCNU 22m/m2 PO q6 weeks, in addition to avastin 147mkg IV q2 weeks.  Avastin will be utilized because of high growth rate and tumor location near eloquent motor structures.  We reviewed side effects of CCNU, including nausea/vomiting, cytopenias.  Effects of avastin could include hypertension, bleedin/clotting, wound healing impairment.  We will check blood counts every 2 weeks concurrent with avastin pre-treat visits.     Chemotherapy should be held for the following:  ANC less than 1,000  Platelets less than 100,000  LFT or creatinine greater than 2x ULN  If clinical concerns/contraindications develop  Avastin should be held for the following:  ANC less than 500  Platelets less than 50,000  LFT or creatinine greater than 2x ULN  If clinical concerns/contraindications develop  Keppra will remain at 100029mID.  We ask that WalCorrin Parkerturn to clinic in 2 weeks for next avastin infusion, or sooner as needed.  Next MRI will follow cycle #3.  All questions were answered. The patient knows to call the clinic with any problems, questions or concerns. No barriers to learning were detected.  The total time spent in the encounter was 30 minutes and more than 50% was on counseling and review of test results   ZacVentura SellersD Medical Director of Neuro-Oncology [facility] [place]/14/23 11:04 AM

## 2022-02-10 ENCOUNTER — Telehealth: Payer: Self-pay | Admitting: Internal Medicine

## 2022-02-10 ENCOUNTER — Other Ambulatory Visit (HOSPITAL_COMMUNITY): Payer: Self-pay

## 2022-02-10 NOTE — Telephone Encounter (Signed)
Called patient to schedule next appointments for treatment. Patient notified.

## 2022-02-12 ENCOUNTER — Other Ambulatory Visit: Payer: Self-pay

## 2022-02-14 ENCOUNTER — Other Ambulatory Visit: Payer: Self-pay

## 2022-02-23 ENCOUNTER — Inpatient Hospital Stay: Payer: 59 | Admitting: Internal Medicine

## 2022-02-23 ENCOUNTER — Inpatient Hospital Stay: Payer: 59

## 2022-03-01 ENCOUNTER — Encounter: Payer: Self-pay | Admitting: Internal Medicine

## 2022-03-02 ENCOUNTER — Inpatient Hospital Stay: Payer: Commercial Managed Care - HMO

## 2022-03-02 ENCOUNTER — Telehealth: Payer: Self-pay | Admitting: Internal Medicine

## 2022-03-02 ENCOUNTER — Inpatient Hospital Stay: Payer: Commercial Managed Care - HMO | Attending: Neurosurgery

## 2022-03-02 ENCOUNTER — Encounter: Payer: Self-pay | Admitting: Internal Medicine

## 2022-03-02 ENCOUNTER — Telehealth: Payer: Self-pay | Admitting: Pharmacy Technician

## 2022-03-02 ENCOUNTER — Other Ambulatory Visit: Payer: Self-pay

## 2022-03-02 ENCOUNTER — Other Ambulatory Visit (HOSPITAL_COMMUNITY): Payer: Self-pay

## 2022-03-02 ENCOUNTER — Inpatient Hospital Stay (HOSPITAL_BASED_OUTPATIENT_CLINIC_OR_DEPARTMENT_OTHER): Payer: Commercial Managed Care - HMO | Admitting: Internal Medicine

## 2022-03-02 VITALS — BP 126/69 | HR 50 | Temp 98.4°F | Resp 18 | Ht 72.0 in | Wt 208.4 lb

## 2022-03-02 DIAGNOSIS — Z7901 Long term (current) use of anticoagulants: Secondary | ICD-10-CM | POA: Diagnosis not present

## 2022-03-02 DIAGNOSIS — F17210 Nicotine dependence, cigarettes, uncomplicated: Secondary | ICD-10-CM | POA: Insufficient documentation

## 2022-03-02 DIAGNOSIS — Z5112 Encounter for antineoplastic immunotherapy: Secondary | ICD-10-CM | POA: Diagnosis not present

## 2022-03-02 DIAGNOSIS — R569 Unspecified convulsions: Secondary | ICD-10-CM | POA: Diagnosis not present

## 2022-03-02 DIAGNOSIS — Z79630 Long term (current) use of alkylating agent: Secondary | ICD-10-CM | POA: Diagnosis not present

## 2022-03-02 DIAGNOSIS — Z833 Family history of diabetes mellitus: Secondary | ICD-10-CM | POA: Diagnosis not present

## 2022-03-02 DIAGNOSIS — R531 Weakness: Secondary | ICD-10-CM | POA: Insufficient documentation

## 2022-03-02 DIAGNOSIS — Z79899 Other long term (current) drug therapy: Secondary | ICD-10-CM | POA: Insufficient documentation

## 2022-03-02 DIAGNOSIS — Z8249 Family history of ischemic heart disease and other diseases of the circulatory system: Secondary | ICD-10-CM | POA: Diagnosis not present

## 2022-03-02 DIAGNOSIS — Z9049 Acquired absence of other specified parts of digestive tract: Secondary | ICD-10-CM | POA: Insufficient documentation

## 2022-03-02 DIAGNOSIS — C711 Malignant neoplasm of frontal lobe: Secondary | ICD-10-CM | POA: Diagnosis not present

## 2022-03-02 DIAGNOSIS — M79605 Pain in left leg: Secondary | ICD-10-CM | POA: Diagnosis not present

## 2022-03-02 DIAGNOSIS — Z885 Allergy status to narcotic agent status: Secondary | ICD-10-CM | POA: Diagnosis not present

## 2022-03-02 DIAGNOSIS — Z808 Family history of malignant neoplasm of other organs or systems: Secondary | ICD-10-CM | POA: Diagnosis not present

## 2022-03-02 DIAGNOSIS — C719 Malignant neoplasm of brain, unspecified: Secondary | ICD-10-CM

## 2022-03-02 LAB — CBC WITH DIFFERENTIAL (CANCER CENTER ONLY)
Abs Immature Granulocytes: 0.02 10*3/uL (ref 0.00–0.07)
Basophils Absolute: 0.1 10*3/uL (ref 0.0–0.1)
Basophils Relative: 1 %
Eosinophils Absolute: 0.3 10*3/uL (ref 0.0–0.5)
Eosinophils Relative: 3 %
HCT: 42.7 % (ref 39.0–52.0)
Hemoglobin: 15.2 g/dL (ref 13.0–17.0)
Immature Granulocytes: 0 %
Lymphocytes Relative: 13 %
Lymphs Abs: 1.3 10*3/uL (ref 0.7–4.0)
MCH: 35.5 pg — ABNORMAL HIGH (ref 26.0–34.0)
MCHC: 35.6 g/dL (ref 30.0–36.0)
MCV: 99.8 fL (ref 80.0–100.0)
Monocytes Absolute: 0.6 10*3/uL (ref 0.1–1.0)
Monocytes Relative: 7 %
Neutro Abs: 7.2 10*3/uL (ref 1.7–7.7)
Neutrophils Relative %: 76 %
Platelet Count: 196 10*3/uL (ref 150–400)
RBC: 4.28 MIL/uL (ref 4.22–5.81)
RDW: 15.9 % — ABNORMAL HIGH (ref 11.5–15.5)
WBC Count: 9.4 10*3/uL (ref 4.0–10.5)
nRBC: 0 % (ref 0.0–0.2)

## 2022-03-02 LAB — CMP (CANCER CENTER ONLY)
ALT: 11 U/L (ref 0–44)
AST: 16 U/L (ref 15–41)
Albumin: 4.5 g/dL (ref 3.5–5.0)
Alkaline Phosphatase: 56 U/L (ref 38–126)
Anion gap: 7 (ref 5–15)
BUN: 17 mg/dL (ref 6–20)
CO2: 26 mmol/L (ref 22–32)
Calcium: 10.1 mg/dL (ref 8.9–10.3)
Chloride: 109 mmol/L (ref 98–111)
Creatinine: 1.39 mg/dL — ABNORMAL HIGH (ref 0.61–1.24)
GFR, Estimated: >60 mL/min (ref >60)
Glucose, Bld: 65 mg/dL — ABNORMAL LOW (ref 70–99)
Potassium: 4.2 mmol/L (ref 3.5–5.1)
Sodium: 142 mmol/L (ref 135–145)
Total Bilirubin: 0.6 mg/dL (ref 0.3–1.2)
Total Protein: 6.5 g/dL (ref 6.5–8.1)

## 2022-03-02 MED ORDER — SODIUM CHLORIDE 0.9 % IV SOLN: Freq: Once | INTRAVENOUS | Status: AC

## 2022-03-02 MED ORDER — SODIUM CHLORIDE 0.9 % IV SOLN
10.0000 mg/kg | Freq: Once | INTRAVENOUS | Status: AC
  Administered 2022-03-02: 900 mg via INTRAVENOUS
  Filled 2022-03-02: qty 32

## 2022-03-02 MED ORDER — LOMUSTINE 100 MG PO CAPS
200.0000 mg | ORAL_CAPSULE | Freq: Once | ORAL | 0 refills | Status: AC
  Filled 2022-03-02: qty 2, 1d supply, fill #0
  Filled 2022-03-09: qty 2, 42d supply, fill #0

## 2022-03-02 NOTE — Progress Notes (Signed)
[facility] at [place], [place] [address] [phone]   Interval Evaluation  Date of Service: 03/02/22 Patient Name: Jerry Richards Patient MRN: [id number] Patient DOB: 22-Sep-1970 Provider: Ventura Sellers, MD  Identifying Statement:  BRODIN GELPI is a 52 y.o. male with right frontal glioblastoma   Oncologic History: Oncology History  High grade glioma not classifiable by WHO criteria ([place])  05/16/2021 Surgery   Craniotomy, R parafalcine resection with Dr. Annette Stable; path is high grade glioma IDHwt   06/20/2021 - 06/20/2021 Chemotherapy   Patient is on Treatment Plan : BRAIN GLIOBLASTOMA Radiation Therapy With Concurrent Temozolomide 75 mg/m2 Daily Followed By Sequential Maintenance Temozolomide x 6-12 cycles     09/05/2021 - 12/17/2021 Chemotherapy   Completes 3 cycles of adjuvant 5-day Temodar   12/18/2021 Progression   Progression of disease #1   12/29/2021 -  Chemotherapy   Initiates second line therapy with CCNU 15m/m2 q6 weeks, and Avastin 159mkg IV q2 weeks   Glioblastoma [facility]iforme of frontal lobe ([facility] 09/12/2021 Initial Diagnosis   Gli[facility]iforme of frontal lobe ([facility]  12/29/2021 -  Chemotherapy   Patient is on Treatment Plan : BRAIN GBM Bevacizumab q14d       Biomarkers:  MGMT Unknown.  IDH 1/2 Wild type.  EGFR Unknown  TERT Unknown   Interval History: WaTYLIEK TIMBERMANresents today for avastin, cycle #2 CCNU.  No clinical changes today.  Denies any recurrence of seizures, no headaches.  Leg symptoms are stable.  Continues to have some pain in the left leg as prior.  H+P (06/07/21): Patient presented to neurologic attention in March with several days of progressive left sided weakness and seizures, characterized by twitching of the left leg.  CNS imaging demonstrated a right frontal mass.  He underwent craniotomy and resection with Dr. PoAnnette Stablen 05/16/21; path demonstrated high grade glioma IDHwt.   Following surgery his left leg was much weaker, though it has improved with aggressive rehab efforts.  Currently he is using a cane to ambulate.  He has no other complaints, continues on decadron 52m85mwice per day.  No seizures since surgery.  Medications: Current Outpatient Medications on File Prior to Visit  Medication Sig Dispense Refill   acetaminophen (TYLENOL) 500 MG tablet Take 1,000 mg by mouth every 6 (six) hours as needed for moderate pain.     albuterol (VENTOLIN HFA) 108 (90 Base) MCG/ACT inhaler Inhale 1 puff into the lungs in the morning, at noon, in the evening, and at bedtime. (Patient not taking: Reported on 11/14/2021)     levETIRAcetam (KEPPRA) 500 MG tablet TAKE 2 TABLETS(1000 MG) BY MOUTH TWICE DAILY 360 tablet 0   losartan (COZAAR) 50 MG tablet Take 50 mg by mouth daily.  6   mirtazapine (REMERON) 15 MG tablet Take 15 mg by mouth at bedtime.     ondansetron (ZOFRAN) 8 MG tablet Take 1 tablet (8 mg total) by mouth every 8 (eight) hours as needed for nausea or vomiting. 30 tablet 1   rivaroxaban (XARELTO) 20 MG TABS tablet Take 1 tablet (20 mg total) by mouth daily. 90 tablet 1   No current facility-administered medications on file prior to visit.    Allergies:  Allergies  Allergen Reactions   Amiodarone     AMIODARONE ANALOGUES Hyperthyroidism   Morphine And Related Palpitations    Chest pressure/palpitations   Past Medical History:  Past Medical History:  Diagnosis Date  Anxiety    Atrial fibrillation ([place])    a. 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 MOST RECENT ECHO (03/2012) EF= 66%   Past  Surgical History:  Past Surgical History:  Procedure Laterality Date   APPLICATION OF CRANIAL NAVIGATION Right 05/16/2021   Procedure: APPLICATION OF CRANIAL NAVIGATION;  Surgeon: Earnie Larsson, MD;  Location: [facility];  Service: Neurosurgery;  Laterality: Right;   ATRIAL ABLATION SURGERY     CARDIAC CATHETERIZATION  08/2011   [facility]   CHOLECYSTECTOMY     CRANIOTOMY Right 05/16/2021   Procedure: Craniotomy - right - Parietal with  brainlab;  Surgeon: Earnie Larsson, MD;  Location: [facility];  Service: Neurosurgery;  Laterality: Right;   FINGER SURGERY     LEFT HEART CATHETERIZATION WITH CORONARY ANGIOGRAM N/A 09/12/2011   Procedure: LEFT HEART CATHETERIZATION WITH CORONARY ANGIOGRAM;  Surgeon: Hillary Bow, MD;  [facility];  Service: Cardiovascular;  Laterality: N/A;   VASECTOMY     Social History:  Social History   Socioeconomic History   Marital status: Married    Spouse name: tanya   Number of children: 2   Years of education: Not on file   Highest education level: Some college, no degree  Occupational History   Occupation: SELF EMPLOYED    Comment: Nurse, 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 Occas tea   Social Determinants of Health   Financial Resource Strain: Not on file  Food Insecurity: Not on file  Transportation Needs: Not on file  Physical Activity: Not on file  Stress: Not on file  Social Connections: Not on file  Intimate Partner Violence: Not on file   Family History:  Family History  Problem Relation  Age of Onset   Healthy Mother        age 23   Melanoma Mother    Melanoma Father    Atrial fibrillation Father        age 52   Diabetes Maternal Grandmother    Coronary artery disease Paternal Grandmother    Heart disease Paternal Grandmother    Heart attack Paternal Grandmother    Coronary artery disease Paternal Grandfather    Heart disease Paternal Grandfather    Heart attack Paternal Grandfather    Atrial fibrillation Paternal Aunt    Atrial fibrillation Paternal Uncle    Colon cancer Neg Hx  Esophageal cancer Neg Hx    Ovarian cancer Neg Hx    Stomach cancer Neg Hx    Rectal cancer Neg Hx     Review of Systems: Constitutional: Doesn't report fevers, chills or abnormal weight loss Eyes: Doesn't report blurriness of vision Ears, nose, mouth, throat, and face: Doesn't report sore throat Respiratory: Doesn't report cough, dyspnea or wheezes Cardiovascular: Doesn't report palpitation, chest discomfort  Gastrointestinal:  Doesn't report nausea, constipation, diarrhea GU: Doesn't report incontinence Skin: Doesn't report skin rashes Neurological: Per HPI Musculoskeletal: Doesn't report joint pain Behavioral/Psych: Doesn't report anxiety  Physical Exam: Vitals:   03/02/22 0942  BP: 126/69  Pulse: (!) 50  Resp: 18  Temp: 98.4 F (36.9 C)  SpO2: 100%   KPS: 80. General: Alert, cooperative, pleasant, in no acute distress Head: Normal EENT: No conjunctival injection or scleral icterus.  Lungs: Resp effort normal Cardiac: Regular rate Abdomen: Non-distended abdomen Skin: No rashes cyanosis or petechiae. Extremities: No clubbing or edema  Neurologic Exam: Mental Status: Awake, alert, attentive to examiner. Oriented to self and environment. Language is fluent with intact comprehension.  Cranial Nerves: Visual acuity is grossly normal. Visual fields are full. Extra-ocular movements intact. No ptosis. Face is symmetric Motor: Tone and bulk are normal. Power is 4/5 in  left leg, subtle drift in left arm. Reflexes are symmetric, no pathologic reflexes present.  Sensory: Intact to light touch Gait: Hemiparetic, cane assisted.   Labs: I have reviewed the data as listed    Component Value Date/Time   NA 140 02/09/2022 0957   NA 140 03/29/2017 1045   K 4.6 02/09/2022 0957   CL 106 02/09/2022 0957   CO2 29 02/09/2022 0957   GLUCOSE 93 02/09/2022 0957   BUN 25 (H) 02/09/2022 0957   BUN 18 03/29/2017 1045   CREATININE 1.31 (H) 02/09/2022 0957   CALCIUM 10.2 02/09/2022 0957   PROT 6.7 02/09/2022 0957   PROT 7.7 03/29/2017 1045   ALBUMIN 4.5 02/09/2022 0957   ALBUMIN 5.2 03/29/2017 1045   AST 15 02/09/2022 0957   ALT 11 02/09/2022 0957   ALKPHOS 52 02/09/2022 0957   BILITOT 0.7 02/09/2022 0957   GFRNONAA >60 02/09/2022 0957   GFRAA 56 (L) 03/29/2017 1045   Lab Results  Component Value Date   WBC 9.4 03/02/2022   NEUTROABS 7.2 03/02/2022   HGB 15.2 03/02/2022   HCT 42.7 03/02/2022   MCV 99.8 03/02/2022   PLT 196 03/02/2022    Assessment/Plan High grade glioma not classifiable by WHO criteria ([place])  Focal seizures ([place])  [name] is clinically stable today, labs are within normal limits.    Will con't with cycle #2 CCNU 32m/m2 PO q6 weeks, in addition to avastin 177mkg IV q2 weeks.  Avastin will be utilized because of high growth rate and tumor location near eloquent motor structures.  We reviewed side effects of CCNU, including nausea/vomiting, cytopenias.  Effects of avastin could include hypertension, bleedin/clotting, wound healing impairment.  We will check blood counts every 2 weeks concurrent with avastin pre-treat visits.     CCNU should be dosed once he receives it.  Chemotherapy should be held for the following:  ANC less than 1,000  Platelets less than 100,000  LFT or creatinine greater than 2x ULN  If clinical concerns/contraindications develop  Avastin should be held for the following:  ANC less than 500   Platelets less than 50,000  LFT or creatinine greater than 2x ULN  If clinical concerns/contraindications develop  Keppra will remain at 1061m BID.  We ask that WCorrin Parkerreturn to clinic in 2 weeks for next avastin infusion, or sooner as needed.  Next MRI will follow current cycle #3.  All questions were answered. The patient knows to call the clinic with any problems, questions or concerns. No barriers to learning were detected.  The total time spent in the encounter was 30 minutes and more than 50% was on counseling and review of test results   ZVentura Sellers MD Medical Director of Neuro-Oncology [facility] [place]01/04/24 9:46 AM

## 2022-03-02 NOTE — Telephone Encounter (Addendum)
Oral Oncology Patient Advocate Encounter  Received update via fax regarding prior authorization for Gleostine. Plan is requesting dosing instructions.  Information faxed back to plan via CMM.  [name], CPhT-Adv Oncology Pharmacy Patient [place] Direct Number: [phone]  Fax: [phone]

## 2022-03-02 NOTE — Patient Instructions (Signed)
[facility]COLOG[facility]  Discharge Instructions: Thank you for choosing [facility] to provide your oncology and hematology care.   If you have a lab appointment with the Cancer Center, please go directly to the Cancer Center and check in at the registration area.   Wear comfortable clothing and clothing appropriate for easy access to any Portacath or PICC line.   We strive to give you quality time with your provider. You may need to reschedule your appointment if you arrive late (15 or more minutes).  Arriving late affects you and other patients whose appointments are after yours.  Also, if you miss three or more appointments without notifying the office, you may be dismissed from the clinic at the provider's discretion.      For prescription refill requests, have your pharmacy contact our office and allow 72 hours for refills to be completed.    Today you received the following chemotherapy and/or immunotherapy agents: MVASI      To help prevent nausea and vomiting after your treatment, we encourage you to take your nausea medication as directed.  BELOW ARE SYMPTOMS THAT SHOULD BE REPORTED IMMEDIATELY: *FEVER GREATER THAN 100.4 F (38 C) OR HIGHER *CHILLS OR SWEATING *NAUSEA AND VOMITING THAT IS NOT CONTROLLED WITH YOUR NAUSEA MEDICATION *UNUSUAL SHORTNESS OF BREATH *UNUSUAL BRUISING OR BLEEDING *URINARY PROBLEMS (pain or burning when urinating, or frequent urination) *BOWEL PROBLEMS (unusual diarrhea, constipation, pain near the anus) TENDERNESS IN MOUTH AND THROAT WITH OR WITHOUT PRESENCE OF ULCERS (sore throat, sores in mouth, or a toothache) UNUSUAL RASH, SWELLING OR PAIN  UNUSUAL VAGINAL DISCHARGE OR ITCHING   Items with * indicate a potential emergency and should be followed up as soon as possible or go to the Emergency Department if any problems should occur.  Please show the CHEMOTHERAPY ALERT CARD or IMMUNOTHERAPY ALERT CARD at check-in to the  Emergency Department and triage nurse.  Should you have questions after your visit or need to cancel or reschedule your appointment, please contact [facility]  Dept: [phone]  and follow the prompts.  Office hours are 8:00 a.m. to 4:30 p.m. Monday - Friday. Please note that voicemails left after 4:00 p.m. may not be returned until the following business day.  We are closed weekends and major holidays. You have access to a nurse at all times for urgent questions. Please call the main number to the clinic Dept: [phone] and follow the prompts.   For any non-urgent questions, you may also contact your provider using MyChart. We now offer e-Visits for anyone 18 and older to request care online for non-urgent symptoms. For details visit [email or web address].   Also download the MyChart app! Go to the app store, search "MyChart", open the app, select [facility], and log in with your MyChart username and password.  Masks are optional in the cancer centers. If you would like for your care team to wear a mask while they are taking care of you, please let them know. You may have one support person who is at least 54 years old accompany you for your appointments. 

## 2022-03-02 NOTE — Telephone Encounter (Signed)
Attempted to call patient to notify of new appointment. Call was dropped. Will f/u at later date. Patient active on mychart

## 2022-03-02 NOTE — Telephone Encounter (Signed)
Oral Oncology Patient Advocate Encounter   Received notification that prior authorization for Sherrlyn Hock is required through [place] submitted on 03/02/22 [place] Status is pending     [name], [place] Patient [place] Direct Number: [phone]  Fax: [phone]

## 2022-03-02 NOTE — Telephone Encounter (Signed)
Oral Oncology Patient Advocate Encounter  Prior Authorization for Sherrlyn Hock has been approved.    PA# [id number] Effective dates: 03/02/22 through 03/02/23  Patients co-pay is $1.938.20.    [name], CPhT-Adv Oncology Pharmacy Patient [place] Direct Number: [phone]  Fax: [phone]

## 2022-03-02 NOTE — Telephone Encounter (Signed)
Oral Oncology Patient Advocate Encounter   Began application for assistance for Gleostine through [employer].   Application will be submitted upon receipt of proof of income.   [employer] phone number [phone].   I will continue to check the status until final determination.   [name], CPhT-Adv Oncology Pharmacy Patient [place] Direct Number: [phone]  Fax: [phone]

## 2022-03-03 ENCOUNTER — Other Ambulatory Visit: Payer: Self-pay

## 2022-03-03 ENCOUNTER — Other Ambulatory Visit (HOSPITAL_COMMUNITY): Payer: Self-pay

## 2022-03-03 NOTE — Telephone Encounter (Signed)
Oral Oncology Patient Advocate Encounter  Prior Authorization for Jerry Richards has been approved through [employer]  PA# [id number] Effective dates: 03/02/22 through 08/31/22  Patients co-pay is $2,2221.95.    [name], CPhT-Adv Oncology Pharmacy Patient [place] Direct Number: [phone]  Fax: [phone]

## 2022-03-07 ENCOUNTER — Telehealth: Payer: Self-pay | Admitting: Pharmacy Technician

## 2022-03-07 ENCOUNTER — Other Ambulatory Visit (HOSPITAL_COMMUNITY): Payer: Self-pay

## 2022-03-07 NOTE — Telephone Encounter (Signed)
Oral Oncology Patient Advocate Encounter   Was successful in securing patient an $1,700 grant from Patient [facility]) to provide copayment coverage for [employer].  This will keep the out of pocket expense at $0.     I have spoken with the patient.    The billing information is as follows and has been shared with [place].   Member ID: [id number] Group ID: [id number] RxBin: [id number] Dates of Eligibility: 12/07/21 through 03/07/23  Fund:  [place], [place] Patient [place] Direct Number: [phone]  Fax: [phone]

## 2022-03-09 ENCOUNTER — Encounter: Payer: Self-pay | Admitting: Internal Medicine

## 2022-03-09 ENCOUNTER — Inpatient Hospital Stay: Payer: Commercial Managed Care - HMO | Admitting: Internal Medicine

## 2022-03-09 ENCOUNTER — Other Ambulatory Visit (HOSPITAL_COMMUNITY): Payer: Self-pay

## 2022-03-09 ENCOUNTER — Inpatient Hospital Stay: Payer: Commercial Managed Care - HMO

## 2022-03-09 ENCOUNTER — Other Ambulatory Visit: Payer: Self-pay

## 2022-03-09 NOTE — Telephone Encounter (Signed)
Oral Oncology Patient Advocate Encounter  Patient will fill first fill using PANF grant at [facility]. Application for patient assistance is still pending and I will continue to follow until a final resolution.  Application still pending POI, patient's wife aware and will get the information to the office as soon as it is available.  Jerry Richards, CPhT-Adv Oncology Pharmacy Patient [place] Direct Number: [phone]  Fax: [phone]

## 2022-03-10 ENCOUNTER — Other Ambulatory Visit: Payer: Self-pay

## 2022-03-10 ENCOUNTER — Other Ambulatory Visit (HOSPITAL_COMMUNITY): Payer: Self-pay

## 2022-03-14 ENCOUNTER — Telehealth: Payer: Self-pay

## 2022-03-14 NOTE — Telephone Encounter (Signed)
Patient informed that a representative from [facility] had called the office to request information about the patient, as they were unable to get in touch with the patient themselves.  Patient provided with the individual's name and phone number to callback and provide information. Patient verbalized an understanding and was appreciative of the call.

## 2022-03-16 ENCOUNTER — Inpatient Hospital Stay (HOSPITAL_BASED_OUTPATIENT_CLINIC_OR_DEPARTMENT_OTHER): Payer: Commercial Managed Care - HMO | Admitting: Internal Medicine

## 2022-03-16 ENCOUNTER — Inpatient Hospital Stay: Payer: Commercial Managed Care - HMO

## 2022-03-16 ENCOUNTER — Encounter: Payer: Self-pay | Admitting: Internal Medicine

## 2022-03-16 VITALS — BP 128/68 | HR 49 | Temp 98.2°F | Resp 18

## 2022-03-16 VITALS — BP 122/69 | HR 46 | Temp 97.9°F | Resp 20 | Wt 207.0 lb

## 2022-03-16 DIAGNOSIS — Z5112 Encounter for antineoplastic immunotherapy: Secondary | ICD-10-CM | POA: Diagnosis not present

## 2022-03-16 DIAGNOSIS — C711 Malignant neoplasm of frontal lobe: Secondary | ICD-10-CM

## 2022-03-16 DIAGNOSIS — R569 Unspecified convulsions: Secondary | ICD-10-CM | POA: Diagnosis not present

## 2022-03-16 DIAGNOSIS — C719 Malignant neoplasm of brain, unspecified: Secondary | ICD-10-CM | POA: Diagnosis not present

## 2022-03-16 LAB — CBC WITH DIFFERENTIAL (CANCER CENTER ONLY)
Abs Immature Granulocytes: 0.03 10*3/uL (ref 0.00–0.07)
Basophils Absolute: 0.1 10*3/uL (ref 0.0–0.1)
Basophils Relative: 1 %
Eosinophils Absolute: 0.5 10*3/uL (ref 0.0–0.5)
Eosinophils Relative: 6 %
HCT: 42.5 % (ref 39.0–52.0)
Hemoglobin: 15 g/dL (ref 13.0–17.0)
Immature Granulocytes: 0 %
Lymphocytes Relative: 12 %
Lymphs Abs: 1 10*3/uL (ref 0.7–4.0)
MCH: 35.4 pg — ABNORMAL HIGH (ref 26.0–34.0)
MCHC: 35.3 g/dL (ref 30.0–36.0)
MCV: 100.2 fL — ABNORMAL HIGH (ref 80.0–100.0)
Monocytes Absolute: 0.5 10*3/uL (ref 0.1–1.0)
Monocytes Relative: 6 %
Neutro Abs: 6.7 10*3/uL (ref 1.7–7.7)
Neutrophils Relative %: 75 %
Platelet Count: 177 10*3/uL (ref 150–400)
RBC: 4.24 MIL/uL (ref 4.22–5.81)
RDW: 15.2 % (ref 11.5–15.5)
WBC Count: 8.8 10*3/uL (ref 4.0–10.5)
nRBC: 0 % (ref 0.0–0.2)

## 2022-03-16 LAB — CMP (CANCER CENTER ONLY)
ALT: 9 U/L (ref 0–44)
AST: 15 U/L (ref 15–41)
Albumin: 4.1 g/dL (ref 3.5–5.0)
Alkaline Phosphatase: 42 U/L (ref 38–126)
Anion gap: 6 (ref 5–15)
BUN: 20 mg/dL (ref 6–20)
CO2: 26 mmol/L (ref 22–32)
Calcium: 9.5 mg/dL (ref 8.9–10.3)
Chloride: 109 mmol/L (ref 98–111)
Creatinine: 1.39 mg/dL — ABNORMAL HIGH (ref 0.61–1.24)
GFR, Estimated: >60 mL/min (ref >60)
Glucose, Bld: 88 mg/dL (ref 70–99)
Potassium: 4.7 mmol/L (ref 3.5–5.1)
Sodium: 141 mmol/L (ref 135–145)
Total Bilirubin: 0.9 mg/dL (ref 0.3–1.2)
Total Protein: 6.3 g/dL — ABNORMAL LOW (ref 6.5–8.1)

## 2022-03-16 LAB — TOTAL PROTEIN, URINE DIPSTICK: Protein, ur: NEGATIVE mg/dL

## 2022-03-16 MED ORDER — SODIUM CHLORIDE 0.9 % IV SOLN: Freq: Once | INTRAVENOUS | Status: AC

## 2022-03-16 MED ORDER — SODIUM CHLORIDE 0.9 % IV SOLN
10.0000 mg/kg | Freq: Once | INTRAVENOUS | Status: AC
  Administered 2022-03-16: 900 mg via INTRAVENOUS
  Filled 2022-03-16: qty 32

## 2022-03-16 NOTE — Progress Notes (Signed)
[facility] [place], [place] [address] [phone]   Interval Evaluation  Date of Service: 03/16/22 Patient Name: Jerry Richards Patient MRN: [id number] Patient DOB: 03-14-1970 Provider: Ventura Sellers, MD  Identifying Statement:  Jerry Richards is a 52 y.o. male with right frontal glioblastoma   Oncologic History: Oncology History  High grade glioma not classifiable by WHO criteria ([place])  05/16/2021 Surgery   Craniotomy, R parafalcine resection with Dr. Annette Stable; path is high grade glioma IDHwt   06/20/2021 - 06/20/2021 Chemotherapy   Patient is on Treatment Plan : BRAIN GLIOBLASTOMA Radiation Therapy With Concurrent Temozolomide 75 mg/m2 Daily Followed By Sequential Maintenance Temozolomide x 6-12 cycles     09/05/2021 - 12/17/2021 Chemotherapy   Completes 3 cycles of adjuvant 5-day Temodar   12/18/2021 Progression   Progression of disease #1   12/29/2021 -  Chemotherapy   Initiates second line therapy with CCNU '90mg'$ /m2 q6 weeks, and Avastin '10mg'$ /kg IV q2 weeks   Glioblastoma multiforme of frontal lobe ([place])  09/12/2021 Initial Diagnosis   Glioblastoma multiforme of frontal lobe ([place])   12/29/2021 -  Chemotherapy   Patient is on Treatment Plan : BRAIN GBM Bevacizumab q14d       Biomarkers:  MGMT Unknown.  IDH 1/2 Wild type.  EGFR Unknown  TERT Unknown   Interval History: Jerry Richards presents today for avastin, now having dosed cycle #2 CCNU on 03/10/22.  No issues with taking the chemo.  No clinical changes today.  Denies any recurrence of seizures, no headaches.  Leg symptoms are stable.  Continues to have some pain in the left leg as prior.  H+P (06/07/21): Patient presented to neurologic attention in March with several days of progressive left sided weakness and seizures, characterized by twitching of the left leg.  CNS imaging demonstrated a right frontal mass.  He underwent craniotomy and resection with Dr.  Annette Stable on 05/16/21; path demonstrated high grade glioma IDHwt.  Following surgery his left leg was much weaker, though it has improved with aggressive rehab efforts.  Currently he is using a cane to ambulate.  He has no other complaints, continues on decadron '2mg'$  twice per day.  No seizures since surgery.  Medications: Current Outpatient Medications on File Prior to Visit  Medication Sig Dispense Refill   acetaminophen (TYLENOL) 500 MG tablet Take 1,000 mg by mouth every 6 (six) hours as needed for moderate pain.     albuterol (VENTOLIN HFA) 108 (90 Base) MCG/ACT inhaler Inhale 1 puff into the lungs every 4 (four) hours as needed.     levETIRAcetam (KEPPRA) 500 MG tablet TAKE 2 TABLETS(1000 MG) BY MOUTH TWICE DAILY 360 tablet 0   losartan (COZAAR) 50 MG tablet Take 50 mg by mouth daily.  6   mirtazapine (REMERON) 15 MG tablet Take 15 mg by mouth at bedtime.     ondansetron (ZOFRAN) 8 MG tablet Take 1 tablet (8 mg total) by mouth every 8 (eight) hours as needed for nausea or vomiting. 30 tablet 1   rivaroxaban (XARELTO) 20 MG TABS tablet Take 1 tablet (20 mg total) by mouth daily. 90 tablet 1   No current facility-administered medications on file prior to visit.    Allergies:  Allergies  Allergen Reactions   Amiodarone     AMIODARONE ANALOGUES Hyperthyroidism   Morphine And Related Itching and Palpitations    Chest pressure/palpitations   Past Medical History:  Past Medical History:  Diagnosis  Date   Anxiety    Atrial fibrillation [facility])    a. 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 MOST RECENT ECHO (03/2012) EF= 66%    Past Surgical History:  Past Surgical History:  Procedure Laterality Date   APPLICATION OF CRANIAL NAVIGATION Right 05/16/2021   Procedure: APPLICATION OF CRANIAL NAVIGATION;  Surgeon: Earnie Larsson, MD;  Location: [facility];  Service: Neurosurgery;  Laterality: Right;   ATRIAL ABLATION SURGERY     CARDIAC CATHETERIZATION  08/2011   [facility]   CHOLECYSTECTOMY     CRANIOTOMY Right 05/16/2021   Procedure: Craniotomy - right - Parietal with  brainlab;  Surgeon: Earnie Larsson, MD;  Location: [facility];  Service: Neurosurgery;  Laterality: Right;   FINGER SURGERY     LEFT HEART CATHETERIZATION WITH CORONARY ANGIOGRAM N/A 09/12/2011   Procedure: LEFT HEART CATHETERIZATION WITH CORONARY ANGIOGRAM;  Surgeon: Hillary Bow, MD;  Location: [facility] CATH LAB;  Service: Cardiovascular;  Laterality: N/A;   VASECTOMY     Social History:  Social History   Socioeconomic History   Marital status: Married    Spouse name: Jerry Richards   Number of children: 2   Years of education: Not on file   Highest education level: Some college, no degree  Occupational History   Occupation: SELF EMPLOYED    Comment: Nurse, 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 Occas tea   Social Determinants of Health   Financial Resource Strain: Not on file  Food Insecurity: Not on file  Transportation Needs: Not on file  Physical Activity: Not on file  Stress: Not on file  Social Connections: Not on file  Intimate Partner Violence: Not on file   Family History:  Family History  Problem  Relation Age of Onset   Healthy Mother        age 5   Melanoma Mother    Melanoma Father    Atrial fibrillation Father        age 22   Diabetes Maternal Grandmother    Coronary artery disease Paternal Grandmother    Heart disease Paternal Grandmother    Heart attack Paternal Grandmother    Coronary artery disease Paternal Grandfather    Heart disease Paternal Grandfather    Heart attack Paternal Grandfather    Atrial fibrillation Paternal Aunt    Atrial fibrillation Paternal Uncle    Colon cancer Neg Hx  Esophageal cancer Neg Hx    Ovarian cancer Neg Hx    Stomach cancer Neg Hx    Rectal cancer Neg Hx     Review of Systems: Constitutional: Doesn't report fevers, chills or abnormal weight loss Eyes: Doesn't report blurriness of vision Ears, nose, mouth, throat, and face: Doesn't report sore throat Respiratory: Doesn't report cough, dyspnea or wheezes Cardiovascular: Doesn't report palpitation, chest discomfort  Gastrointestinal:  Doesn't report nausea, constipation, diarrhea GU: Doesn't report incontinence Skin: Doesn't report skin rashes Neurological: Per HPI Musculoskeletal: Doesn't report joint pain Behavioral/Psych: Doesn't report anxiety  Physical Exam: Vitals:   03/16/22 0850  BP: 122/69  Pulse: (!) 46  Resp: 20  Temp: 97.9 F (36.6 C)  SpO2: 100%   KPS: 80. General: Alert, cooperative, pleasant, in no acute distress Head: Normal EENT: No conjunctival injection or scleral icterus.  Lungs: Resp effort normal Cardiac: Regular rate Abdomen: Non-distended abdomen Skin: No rashes cyanosis or petechiae. Extremities: No clubbing or edema  Neurologic Exam: Mental Status: Awake, alert, attentive to examiner. Oriented to self and environment. Language is fluent with intact comprehension.  Cranial Nerves: Visual acuity is grossly normal. Visual fields are full. Extra-ocular movements intact. No ptosis. Face is symmetric Motor: Tone and bulk are normal. Power is  4/5 in left leg, subtle drift in left arm. Reflexes are symmetric, no pathologic reflexes present.  Sensory: Intact to light touch Gait: Hemiparetic, cane assisted.   Labs: I have reviewed the data as listed    Component Value Date/Time   NA 142 03/02/2022 0931   NA 140 03/29/2017 1045   K 4.2 03/02/2022 0931   CL 109 03/02/2022 0931   CO2 26 03/02/2022 0931   GLUCOSE 65 (L) 03/02/2022 0931   BUN 17 03/02/2022 0931   BUN 18 03/29/2017 1045   CREATININE 1.39 (H) 03/02/2022 0931   CALCIUM 10.1 03/02/2022 0931   PROT 6.5 03/02/2022 0931   PROT 7.7 03/29/2017 1045   ALBUMIN 4.5 03/02/2022 0931   ALBUMIN 5.2 03/29/2017 1045   AST 16 03/02/2022 0931   ALT 11 03/02/2022 0931   ALKPHOS 56 03/02/2022 0931   BILITOT 0.6 03/02/2022 0931   GFRNONAA >60 03/02/2022 0931   GFRAA 56 (L) 03/29/2017 1045   Lab Results  Component Value Date   WBC 8.8 03/16/2022   NEUTROABS 6.7 03/16/2022   HGB 15.0 03/16/2022   HCT 42.5 03/16/2022   MCV 100.2 (H) 03/16/2022   PLT 177 03/16/2022    Assessment/Plan High grade glioma not classifiable by WHO criteria ([place])  Glioblastoma multiforme of frontal lobe ([place]) - Plan: Clinic Appointment Request  Focal seizures ([place])  [name] is clinically stable today, labs are within normal limits.  Blood pressure remains stable with avastin.  Will con't with cycle #2 CCNU '90mg'$ /m2 PO q6 weeks, now day 7/42, in addition to avastin '10mg'$ /kg IV q2 weeks.  Avastin will be utilized because of high growth rate and tumor location near eloquent motor structures.  We reviewed side effects of CCNU, including nausea/vomiting, cytopenias.  Effects of avastin could include hypertension, bleedin/clotting, wound healing impairment.  We will check blood counts every 2 weeks concurrent with avastin pre-treat visits.     CCNU should be dosed once he receives it.  Chemotherapy should be held for the following:  ANC less than 1,000  Platelets less than 100,000  LFT  or creatinine greater than 2x ULN  If clinical concerns/contraindications develop  Avastin should be held for the following:  [place]  less than 500  Platelets less than 50,000  LFT or creatinine greater than 2x ULN  If clinical concerns/contraindications develop  Keppra will remain at '1000mg'$  BID.  We ask that [name] return to clinic in 2 weeks for next avastin infusion, or sooner as needed.  Next MRI will follow current cycle #3.  All questions were answered. The patient knows to call the clinic with any problems, questions or concerns. No barriers to learning were detected.  The total time spent in the encounter was 30 minutes and more than 50% was on counseling and review of test results   Ventura Sellers, MD Medical Director of Neuro-Oncology [facility] [place] 03/16/22 9:02 AM

## 2022-03-16 NOTE — Patient Instructions (Signed)
[facility]ICAL ONCOLOGY  Discharge Instructions: Thank you for choosing [facility] to provide your oncology and hematology care.   If you have a lab appointment with the Cancer Center, please go directly to the Cancer Center and check in at the registration area.   Wear comfortable clothing and clothing appropriate for easy access to any Portacath or PICC line.   We strive to give you quality time with your provider. You may need to reschedule your appointment if you arrive late (15 or more minutes).  Arriving late affects you and other patients whose appointments are after yours.  Also, if you miss three or more appointments without notifying the office, you may be dismissed from the clinic at the provider's discretion.      For prescription refill requests, have your pharmacy contact our office and allow 72 hours for refills to be completed.    Today you received the following chemotherapy and/or immunotherapy agents: MVASI      To help prevent nausea and vomiting after your treatment, we encourage you to take your nausea medication as directed.  BELOW ARE SYMPTOMS THAT SHOULD BE REPORTED IMMEDIATELY: *FEVER GREATER THAN 100.4 F (38 C) OR HIGHER *CHILLS OR SWEATING *NAUSEA AND VOMITING THAT IS NOT CONTROLLED WITH YOUR NAUSEA MEDICATION *UNUSUAL SHORTNESS OF BREATH *UNUSUAL BRUISING OR BLEEDING *URINARY PROBLEMS (pain or burning when urinating, or frequent urination) *BOWEL PROBLEMS (unusual diarrhea, constipation, pain near the anus) TENDERNESS IN MOUTH AND THROAT WITH OR WITHOUT PRESENCE OF ULCERS (sore throat, sores in mouth, or a toothache) UNUSUAL RASH, SWELLING OR PAIN  UNUSUAL VAGINAL DISCHARGE OR ITCHING   Items with * indicate a potential emergency and should be followed up as soon as possible or go to the Emergency Department if any problems should occur.  Please show the CHEMOTHERAPY ALERT CARD or IMMUNOTHERAPY ALERT CARD at check-in to the  Emergency Department and triage nurse.  Should you have questions after your visit or need to cancel or reschedule your appointment, please contact [facility]COL[facility]  Dept: [phone]  and follow the prompts.  Office hours are 8:00 a.m. to 4:30 p.m. Monday - Friday. Please note that voicemails left after 4:00 p.m. may not be returned until the following business day.  We are closed weekends and major holidays. You have access to a nurse at all times for urgent questions. Please call the main number to the clinic Dept: [phone] and follow the prompts.   For any non-urgent questions, you may also contact your provider using MyChart. We now offer e-Visits for anyone 18 and older to request care online for non-urgent symptoms. For details visit [email or web address].   Also download the MyChart app! Go to the app store, search "MyChart", open the app, select [place], and log in with your MyChart username and password.  Masks are optional in the cancer centers. If you would like for your care team to wear a mask while they are taking care of you, please let them know. You may have one support person who is at least 54 years old accompany you for your appointments. 

## 2022-03-17 ENCOUNTER — Other Ambulatory Visit: Payer: Self-pay

## 2022-03-17 ENCOUNTER — Telehealth: Payer: Self-pay | Admitting: Internal Medicine

## 2022-03-17 NOTE — Telephone Encounter (Signed)
Called patient per 1/18 [place] notes. Left voicemail with  new appointment information.

## 2022-03-19 ENCOUNTER — Other Ambulatory Visit: Payer: Self-pay

## 2022-03-20 ENCOUNTER — Other Ambulatory Visit: Payer: Self-pay

## 2022-03-30 ENCOUNTER — Inpatient Hospital Stay (HOSPITAL_BASED_OUTPATIENT_CLINIC_OR_DEPARTMENT_OTHER): Payer: Commercial Managed Care - HMO | Admitting: Internal Medicine

## 2022-03-30 ENCOUNTER — Inpatient Hospital Stay: Payer: Commercial Managed Care - HMO | Attending: Neurosurgery

## 2022-03-30 ENCOUNTER — Inpatient Hospital Stay: Payer: Commercial Managed Care - HMO

## 2022-03-30 VITALS — BP 112/76 | HR 46 | Resp 17

## 2022-03-30 VITALS — BP 151/82 | HR 45 | Temp 97.9°F | Resp 20 | Wt 206.2 lb

## 2022-03-30 DIAGNOSIS — C719 Malignant neoplasm of brain, unspecified: Secondary | ICD-10-CM | POA: Diagnosis not present

## 2022-03-30 DIAGNOSIS — Z5112 Encounter for antineoplastic immunotherapy: Secondary | ICD-10-CM | POA: Insufficient documentation

## 2022-03-30 DIAGNOSIS — Z808 Family history of malignant neoplasm of other organs or systems: Secondary | ICD-10-CM | POA: Diagnosis not present

## 2022-03-30 DIAGNOSIS — Z9049 Acquired absence of other specified parts of digestive tract: Secondary | ICD-10-CM | POA: Insufficient documentation

## 2022-03-30 DIAGNOSIS — Z7901 Long term (current) use of anticoagulants: Secondary | ICD-10-CM | POA: Insufficient documentation

## 2022-03-30 DIAGNOSIS — Z885 Allergy status to narcotic agent status: Secondary | ICD-10-CM | POA: Insufficient documentation

## 2022-03-30 DIAGNOSIS — Z79899 Other long term (current) drug therapy: Secondary | ICD-10-CM | POA: Insufficient documentation

## 2022-03-30 DIAGNOSIS — C711 Malignant neoplasm of frontal lobe: Secondary | ICD-10-CM

## 2022-03-30 DIAGNOSIS — M79605 Pain in left leg: Secondary | ICD-10-CM | POA: Diagnosis not present

## 2022-03-30 DIAGNOSIS — M25512 Pain in left shoulder: Secondary | ICD-10-CM | POA: Diagnosis not present

## 2022-03-30 DIAGNOSIS — F17210 Nicotine dependence, cigarettes, uncomplicated: Secondary | ICD-10-CM | POA: Diagnosis not present

## 2022-03-30 DIAGNOSIS — Z8249 Family history of ischemic heart disease and other diseases of the circulatory system: Secondary | ICD-10-CM | POA: Insufficient documentation

## 2022-03-30 DIAGNOSIS — Z833 Family history of diabetes mellitus: Secondary | ICD-10-CM | POA: Diagnosis not present

## 2022-03-30 LAB — CMP (CANCER CENTER ONLY)
ALT: 9 U/L (ref 0–44)
AST: 13 U/L — ABNORMAL LOW (ref 15–41)
Albumin: 4.2 g/dL (ref 3.5–5.0)
Alkaline Phosphatase: 45 U/L (ref 38–126)
Anion gap: 7 (ref 5–15)
BUN: 19 mg/dL (ref 6–20)
CO2: 26 mmol/L (ref 22–32)
Calcium: 9.7 mg/dL (ref 8.9–10.3)
Chloride: 106 mmol/L (ref 98–111)
Creatinine: 1.21 mg/dL (ref 0.61–1.24)
GFR, Estimated: >60 mL/min (ref >60)
Glucose, Bld: 79 mg/dL (ref 70–99)
Potassium: 4.5 mmol/L (ref 3.5–5.1)
Sodium: 139 mmol/L (ref 135–145)
Total Bilirubin: 1 mg/dL (ref 0.3–1.2)
Total Protein: 6.7 g/dL (ref 6.5–8.1)

## 2022-03-30 LAB — CBC WITH DIFFERENTIAL (CANCER CENTER ONLY)
Abs Immature Granulocytes: 0.02 10*3/uL (ref 0.00–0.07)
Basophils Absolute: 0 10*3/uL (ref 0.0–0.1)
Basophils Relative: 1 %
Eosinophils Absolute: 0.4 10*3/uL (ref 0.0–0.5)
Eosinophils Relative: 6 %
HCT: 41.5 % (ref 39.0–52.0)
Hemoglobin: 15.1 g/dL (ref 13.0–17.0)
Immature Granulocytes: 0 %
Lymphocytes Relative: 16 %
Lymphs Abs: 1 10*3/uL (ref 0.7–4.0)
MCH: 35.5 pg — ABNORMAL HIGH (ref 26.0–34.0)
MCHC: 36.4 g/dL — ABNORMAL HIGH (ref 30.0–36.0)
MCV: 97.6 fL (ref 80.0–100.0)
Monocytes Absolute: 0.6 10*3/uL (ref 0.1–1.0)
Monocytes Relative: 9 %
Neutro Abs: 4.3 10*3/uL (ref 1.7–7.7)
Neutrophils Relative %: 68 %
Platelet Count: 110 10*3/uL — ABNORMAL LOW (ref 150–400)
RBC: 4.25 MIL/uL (ref 4.22–5.81)
RDW: 14.6 % (ref 11.5–15.5)
WBC Count: 6.4 10*3/uL (ref 4.0–10.5)
nRBC: 0 % (ref 0.0–0.2)

## 2022-03-30 MED ORDER — SODIUM CHLORIDE 0.9 % IV SOLN: Freq: Once | INTRAVENOUS | Status: AC

## 2022-03-30 MED ORDER — SODIUM CHLORIDE 0.9 % IV SOLN
10.0000 mg/kg | Freq: Once | INTRAVENOUS | Status: AC
  Administered 2022-03-30: 900 mg via INTRAVENOUS
  Filled 2022-03-30: qty 20

## 2022-03-30 MED ORDER — TRAMADOL HCL 50 MG PO TABS: 50.0000 mg | ORAL_TABLET | Freq: Four times a day (QID) | ORAL | 0 refills | Status: DC | PRN

## 2022-03-30 NOTE — Progress Notes (Signed)
[facility] at [place], [place] [address] [phone]   Interval Evaluation  Date of Service: 03/30/22 Patient Name: Jerry Richards Patient MRN: [id number] Patient DOB: August 13, 1970 Provider: Ventura Sellers, MD  Identifying Statement:  Jerry Richards is a 52 y.o. male with right frontal glioblastoma   Oncologic History: Oncology History  High grade glioma not classifiable by WHO criteria ([place])  05/16/2021 Surgery   Craniotomy, R parafalcine resection with Dr. Annette Stable; path is high grade glioma IDHwt   06/20/2021 - 06/20/2021 Chemotherapy   Patient is on Treatment Plan : BRAIN GLIOBLASTOMA Radiation Therapy With Concurrent Temozolomide 75 mg/m2 Daily Followed By Sequential Maintenance Temozolomide x 6-12 cycles     09/05/2021 - 12/17/2021 Chemotherapy   Completes 3 cycles of adjuvant 5-day Temodar   12/18/2021 Progression   Progression of disease #1   12/29/2021 -  Chemotherapy   Initiates second line therapy with CCNU '90mg'$ /m2 q6 weeks, and Avastin '10mg'$ /kg IV q2 weeks   Glioblastoma multiforme of frontal lobe ([place])  09/12/2021 Initial Diagnosis   Glioblastoma multiforme of frontal lobe ([place])   12/29/2021 -  Chemotherapy   Patient is on Treatment Plan : BRAIN GBM Bevacizumab q14d       Biomarkers:  MGMT Unknown.  IDH 1/2 Wild type.  EGFR Unknown  TERT Unknown   Interval History: Jerry Richards presents today for avastin, now having dosed cycle #3 CCNU on 03/10/22.  Today he denies neurologic changes but does describe worsening left shoulder pain.  Tylenol has not helped.  Denies any recurrence of seizures, no headaches.  Leg symptoms are stable.  Continues to have some pain in the left leg as prior.  H+P (06/07/21): Patient presented to neurologic attention in March with several days of progressive left sided weakness and seizures, characterized by twitching of the left leg.  CNS imaging demonstrated a right frontal mass.   He underwent craniotomy and resection with Dr. Annette Stable on 05/16/21; path demonstrated high grade glioma IDHwt.  Following surgery his left leg was much weaker, though it has improved with aggressive rehab efforts.  Currently he is using a cane to ambulate.  He has no other complaints, continues on decadron '2mg'$  twice per day.  No seizures since surgery.  Medications: Current Outpatient Medications on File Prior to Visit  Medication Sig Dispense Refill   acetaminophen (TYLENOL) 500 MG tablet Take 1,000 mg by mouth every 6 (six) hours as needed for moderate pain.     albuterol (VENTOLIN HFA) 108 (90 Base) MCG/ACT inhaler Inhale 1 puff into the lungs every 4 (four) hours as needed.     levETIRAcetam (KEPPRA) 500 MG tablet TAKE 2 TABLETS(1000 MG) BY MOUTH TWICE DAILY 360 tablet 0   losartan (COZAAR) 50 MG tablet Take 50 mg by mouth daily.  6   mirtazapine (REMERON) 15 MG tablet Take 15 mg by mouth at bedtime.     ondansetron (ZOFRAN) 8 MG tablet Take 1 tablet (8 mg total) by mouth every 8 (eight) hours as needed for nausea or vomiting. 30 tablet 1   rivaroxaban (XARELTO) 20 MG TABS tablet Take 1 tablet (20 mg total) by mouth daily. 90 tablet 1   No current facility-administered medications on file prior to visit.    Allergies:  Allergies  Allergen Reactions   Amiodarone     AMIODARONE ANALOGUES Hyperthyroidism   Morphine And Related Itching and Palpitations    Chest pressure/palpitations   Past Medical History:  Past Medical History:  Diagnosis Date   Anxiety    Atrial fibrillation [facility])    a. s/p RFCA x 2 (10/26/2011, 04/15/2012)   Atrial flutter (HCC)    Chronic kidney disease    Clotting disorder ([place])    Colitis    Current smoker    Diverticulitis    Finger near amputation, left    Hyperthyroidism    Secondary to amiodarone   LA thrombus    Non-ischemic cardiomyopathy ([place])    OSA on CPAP    Sinus trouble    Sleep apnea    Systolic heart failure ([place])    EF 20-25%-> 50-55% in  10/2011. MOST RECENT ECHO (03/2012) EF= 66%   Past Surgical History:  Past Surgical History:  Procedure Laterality Date   APPLICATION OF CRANIAL NAVIGATION Right 05/16/2021   Procedure: APPLICATION OF CRANIAL NAVIGATION;  Surgeon: Earnie Larsson, MD;  Location: [facility];  Service: Neurosurgery;  Laterality: Right;   ATRIAL ABLATION SURGERY     CARDIAC CATHETERIZATION  08/2011   [facility]   CHOLECYSTECTOMY     CRANIOTOMY Right 05/16/2021   Procedure: Craniotomy - right - Parietal with  brainlab;  Surgeon: Earnie Larsson, MD;  Location: [facility];  Service: Neurosurgery;  Laterality: Right;   FINGER SURGERY     LEFT HEART CATHETERIZATION WITH CORONARY ANGIOGRAM N/A 09/12/2011   Procedure: LEFT HEART CATHETERIZATION WITH CORONARY ANGIOGRAM;  Surgeon: Hillary Bow, MD;  Location: [facility] CATH LAB;  Service: Cardiovascular;  Laterality: N/A;   VASECTOMY     Social History:  Social History   Socioeconomic History   Marital status: Married    Spouse name: tanya   Number of children: 2   Years of education: Not on file   Highest education level: Some college, no degree  Occupational History   Occupation: SELF EMPLOYED    Comment: Nurse, 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 Occas tea   Social Determinants of Health   Financial Resource Strain: Not on file  Food Insecurity: Not on file  Transportation Needs: Not on file  Physical Activity: Not on file  Stress: Not on file  Social Connections: Not on file  Intimate Partner Violence: Not on file    Family History:  Family History  Problem Relation Age of Onset   Healthy Mother        age 28   Melanoma Mother    Melanoma Father    Atrial fibrillation Father        age 83   Diabetes Maternal Grandmother    Coronary artery disease Paternal Grandmother    Heart disease Paternal Grandmother    Heart attack Paternal Grandmother    Coronary artery disease Paternal Grandfather    Heart disease Paternal Grandfather    Heart attack Paternal Grandfather    Atrial fibrillation Paternal Aunt    Atrial fibrillation Paternal Uncle  Colon cancer Neg Hx    Esophageal cancer Neg Hx    Ovarian cancer Neg Hx    Stomach cancer Neg Hx    Rectal cancer Neg Hx     Review of Systems: Constitutional: Doesn't report fevers, chills or abnormal weight loss Eyes: Doesn't report blurriness of vision Ears, nose, mouth, throat, and face: Doesn't report sore throat Respiratory: Doesn't report cough, dyspnea or wheezes Cardiovascular: Doesn't report palpitation, chest discomfort  Gastrointestinal:  Doesn't report nausea, constipation, diarrhea GU: Doesn't report incontinence Skin: Doesn't report skin rashes Neurological: Per HPI Musculoskeletal: Doesn't report joint pain Behavioral/Psych: Doesn't report anxiety  Physical Exam: Vitals:   03/30/22 1140  BP: (!) 151/82  Pulse: (!) 45  Resp: 20  Temp: 97.9 F (36.6 C)  SpO2: 100%   KPS: 80. General: Alert, cooperative, pleasant, in no acute distress Head: Normal EENT: No conjunctival injection or scleral icterus.  Lungs: Resp effort normal Cardiac: Regular rate Abdomen: Non-distended abdomen Skin: No rashes cyanosis or petechiae. Extremities: No clubbing or edema  Neurologic Exam: Mental Status: Awake, alert, attentive to examiner. Oriented to self and environment. Language is fluent with intact comprehension.  Cranial Nerves: Visual acuity is grossly normal. Visual fields are full. Extra-ocular movements intact. No ptosis. Face is  symmetric Motor: Tone and bulk are normal. Power is 4/5 in left leg, subtle drift in left arm. Reflexes are symmetric, no pathologic reflexes present.  Sensory: Intact to light touch Gait: Hemiparetic, cane assisted.   Labs: I have reviewed the data as listed    Component Value Date/Time   NA 139 03/30/2022 1129   NA 140 03/29/2017 1045   K 4.5 03/30/2022 1129   CL 106 03/30/2022 1129   CO2 26 03/30/2022 1129   GLUCOSE 79 03/30/2022 1129   BUN 19 03/30/2022 1129   BUN 18 03/29/2017 1045   CREATININE 1.21 03/30/2022 1129   CALCIUM 9.7 03/30/2022 1129   PROT 6.7 03/30/2022 1129   PROT 7.7 03/29/2017 1045   ALBUMIN 4.2 03/30/2022 1129   ALBUMIN 5.2 03/29/2017 1045   AST 13 (L) 03/30/2022 1129   ALT 9 03/30/2022 1129   ALKPHOS 45 03/30/2022 1129   BILITOT 1.0 03/30/2022 1129   GFRNONAA >60 03/30/2022 1129   GFRAA 56 (L) 03/29/2017 1045   Lab Results  Component Value Date   WBC 6.4 03/30/2022   NEUTROABS 4.3 03/30/2022   HGB 15.1 03/30/2022   HCT 41.5 03/30/2022   MCV 97.6 03/30/2022   PLT 110 (L) 03/30/2022    Assessment/Plan High grade glioma not classifiable by WHO criteria ([place])  Jerry Richards is clinically stable today, labs are within normal limits.  No new or progressive changes.  Will con't with cycle #3 CCNU '90mg'$ /m2 PO q6 weeks, now day 21/42, in addition to avastin '10mg'$ /kg IV q2 weeks.  Avastin will be utilized because of high growth rate and tumor location near eloquent motor structures.  We reviewed side effects of CCNU, including nausea/vomiting, cytopenias.  Effects of avastin could include hypertension, bleedin/clotting, wound healing impairment.  We will check blood counts every 2 weeks concurrent with avastin pre-treat visits.     Chemotherapy should be held for the following:  ANC less than 1,000  Platelets less than 100,000  LFT or creatinine greater than 2x ULN  If clinical concerns/contraindications develop  Avastin should be held for the  following:  ANC less than 500  Platelets less than 50,000  LFT or creatinine greater than 2x ULN  If clinical  concerns/contraindications develop  Keppra will remain at '1000mg'$  BID.  Shoulder discomfort could be arthralgia secondary to avastin.  If this progresses, we will consider dose reducing or moving to q3 weeks.  Tramadol '50mg'$  q6 PRN will be prescribed for severe pain episodes.  We ask that Jerry Richards return to clinic in 2 weeks for next avastin infusion, or sooner as needed.  Next MRI will follow current cycle #3.  All questions were answered. The patient knows to call the clinic with any problems, questions or concerns. No barriers to learning were detected.  The total time spent in the encounter was 30 minutes and more than 50% was on counseling and review of test results   Ventura Sellers, MD Medical Director of Neuro-Oncology [facility] at [place] 03/30/22 12:05 PM

## 2022-03-30 NOTE — Progress Notes (Signed)
Ok to treat with Avastin 03/30/2022 without urine protein per Dr Mickeal Skinner.

## 2022-03-31 ENCOUNTER — Other Ambulatory Visit: Payer: Self-pay | Admitting: Internal Medicine

## 2022-03-31 ENCOUNTER — Telehealth: Payer: Self-pay

## 2022-03-31 ENCOUNTER — Telehealth: Payer: Self-pay | Admitting: Internal Medicine

## 2022-03-31 DIAGNOSIS — C711 Malignant neoplasm of frontal lobe: Secondary | ICD-10-CM

## 2022-03-31 NOTE — Telephone Encounter (Signed)
Called patient after a mychart message from his wife. She stated that the shoulder pain  got worse last night it was a 10/10.  He took Tramadol which did not help, when I called the patient this morning he stated he was at the chiropractor to see if he could work the pain out. He stated he would call us back if their is no relief after the appointment.

## 2022-03-31 NOTE — Telephone Encounter (Signed)
Contacted patient to scheduled appointments. Left message with appointment details and a call back number if patient had any questions or could not accommodate the time we provided.   

## 2022-04-02 ENCOUNTER — Other Ambulatory Visit: Payer: Self-pay

## 2022-04-03 ENCOUNTER — Other Ambulatory Visit: Payer: Self-pay

## 2022-04-04 ENCOUNTER — Other Ambulatory Visit: Payer: Self-pay | Admitting: Radiation Therapy

## 2022-04-04 ENCOUNTER — Other Ambulatory Visit: Payer: Self-pay

## 2022-04-10 ENCOUNTER — Telehealth: Payer: Self-pay

## 2022-04-10 ENCOUNTER — Other Ambulatory Visit (HOSPITAL_COMMUNITY): Payer: Self-pay

## 2022-04-10 ENCOUNTER — Encounter: Payer: Self-pay | Admitting: Internal Medicine

## 2022-04-10 ENCOUNTER — Other Ambulatory Visit: Payer: Self-pay

## 2022-04-10 DIAGNOSIS — M25512 Pain in left shoulder: Secondary | ICD-10-CM

## 2022-04-10 NOTE — Telephone Encounter (Signed)
Pt's wife advised of recommendation and agreed to plan.  Wife is asking if there is anything else he can do until seeing Ortho? Any other pain medication he can try?

## 2022-04-10 NOTE — Telephone Encounter (Signed)
T/C from pt's wife stating his shoulder pain is worse and he is not getting any relief from any  pain meds or Chiropractor adjustments.  His pain is a constant 5-10 all day.   Please advise

## 2022-04-10 NOTE — Addendum Note (Signed)
Addended by: Ventura Sellers on: 04/10/2022 10:39 AM   Modules accepted: Orders

## 2022-04-10 NOTE — Telephone Encounter (Signed)
Pt's wife, Lavella Lemons, advised and agreed with plan

## 2022-04-11 ENCOUNTER — Other Ambulatory Visit (HOSPITAL_COMMUNITY): Payer: Self-pay

## 2022-04-13 ENCOUNTER — Other Ambulatory Visit: Payer: Self-pay

## 2022-04-13 ENCOUNTER — Inpatient Hospital Stay: Payer: Commercial Managed Care - HMO | Admitting: Internal Medicine

## 2022-04-13 ENCOUNTER — Inpatient Hospital Stay: Payer: Commercial Managed Care - HMO

## 2022-04-13 VITALS — BP 125/82 | HR 48 | Temp 98.1°F | Resp 20 | Wt 203.1 lb

## 2022-04-13 DIAGNOSIS — C711 Malignant neoplasm of frontal lobe: Secondary | ICD-10-CM | POA: Diagnosis not present

## 2022-04-13 DIAGNOSIS — C719 Malignant neoplasm of brain, unspecified: Secondary | ICD-10-CM | POA: Diagnosis not present

## 2022-04-13 DIAGNOSIS — R569 Unspecified convulsions: Secondary | ICD-10-CM

## 2022-04-13 DIAGNOSIS — Z5112 Encounter for antineoplastic immunotherapy: Secondary | ICD-10-CM | POA: Diagnosis not present

## 2022-04-13 LAB — CBC WITH DIFFERENTIAL (CANCER CENTER ONLY)
Abs Immature Granulocytes: 0.03 10*3/uL (ref 0.00–0.07)
Basophils Absolute: 0.1 10*3/uL (ref 0.0–0.1)
Basophils Relative: 1 %
Eosinophils Absolute: 0.6 10*3/uL — ABNORMAL HIGH (ref 0.0–0.5)
Eosinophils Relative: 8 %
HCT: 39.5 % (ref 39.0–52.0)
Hemoglobin: 14.3 g/dL (ref 13.0–17.0)
Immature Granulocytes: 0 %
Lymphocytes Relative: 16 %
Lymphs Abs: 1.2 10*3/uL (ref 0.7–4.0)
MCH: 35.8 pg — ABNORMAL HIGH (ref 26.0–34.0)
MCHC: 36.2 g/dL — ABNORMAL HIGH (ref 30.0–36.0)
MCV: 99 fL (ref 80.0–100.0)
Monocytes Absolute: 0.3 10*3/uL (ref 0.1–1.0)
Monocytes Relative: 5 %
Neutro Abs: 5.1 10*3/uL (ref 1.7–7.7)
Neutrophils Relative %: 70 %
Platelet Count: 125 10*3/uL — ABNORMAL LOW (ref 150–400)
RBC: 3.99 MIL/uL — ABNORMAL LOW (ref 4.22–5.81)
RDW: 14.3 % (ref 11.5–15.5)
WBC Count: 7.3 10*3/uL (ref 4.0–10.5)
nRBC: 0 % (ref 0.0–0.2)

## 2022-04-13 LAB — CMP (CANCER CENTER ONLY)
ALT: 11 U/L (ref 0–44)
AST: 13 U/L — ABNORMAL LOW (ref 15–41)
Albumin: 4.3 g/dL (ref 3.5–5.0)
Alkaline Phosphatase: 48 U/L (ref 38–126)
Anion gap: 8 (ref 5–15)
BUN: 18 mg/dL (ref 6–20)
CO2: 25 mmol/L (ref 22–32)
Calcium: 9.6 mg/dL (ref 8.9–10.3)
Chloride: 107 mmol/L (ref 98–111)
Creatinine: 1.21 mg/dL (ref 0.61–1.24)
GFR, Estimated: >60 mL/min (ref >60)
Glucose, Bld: 94 mg/dL (ref 70–99)
Potassium: 4.4 mmol/L (ref 3.5–5.1)
Sodium: 140 mmol/L (ref 135–145)
Total Bilirubin: 0.8 mg/dL (ref 0.3–1.2)
Total Protein: 6.6 g/dL (ref 6.5–8.1)

## 2022-04-13 LAB — TOTAL PROTEIN, URINE DIPSTICK: Protein, ur: NEGATIVE mg/dL

## 2022-04-13 NOTE — Progress Notes (Signed)
[facility] at [place], [place] [address] [phone]   Interval Evaluation  Date of Service: 04/13/22 Patient Name: Jerry Richards Patient MRN: [id number] Patient DOB: [birth year] Provider: Ventura Sellers, MD  Identifying Statement:  Jerry Richards is a 52 y.o. male with right frontal glioblastoma   Oncologic History: Oncology History  High grade glioma not classifiable by WHO criteria ([place])  05/16/2021 Surgery   Craniotomy, R parafalcine resection with Dr. Annette Stable; path is high grade glioma IDHwt   06/20/2021 - 06/20/2021 Chemotherapy   Patient is on Treatment Plan : BRAIN GLIOBLASTOMA Radiation Therapy With Concurrent Temozolomide 75 mg/m2 Daily Followed By Sequential Maintenance Temozolomide x 6-12 cycles     09/05/2021 - 12/17/2021 Chemotherapy   Completes 3 cycles of adjuvant 5-day Temodar   12/18/2021 Progression   Progression of disease #1   12/29/2021 -  Chemotherapy   Initiates second line therapy with CCNU 47m/m2 q6 weeks, and Avastin 164mkg IV q2 weeks   Gli[place]e of frontal lobe ([place] 09/12/2021 Initial Diagnosis   Gli[place]iforme of frontal lobe ([place]  12/29/2021 -  Chemotherapy   Patient is on Treatment Plan : BRAIN GBM Bevacizumab q14d       Biomarkers:  MGMT Unknown.  IDH 1/2 Wild type.  EGFR Unknown  TERT Unknown   Interval History: Jerry SANGUINOresents today for avastin, now having dosed cycle #3 CCNU on 03/10/22.  Today he denies neurologic changes but does describe worsening shoulder pain.  Shoulder pain, now affecting both sides (previously just the left), is severe and impairing his function.  He had bout of 10/10 pain following avastin infusion two weeks ago.  Tramadol 1002ms the only thing to have helped modestly.  Denies any recurrence of seizures, no headaches.  Leg symptoms are stable.    H+P (06/07/21): Patient presented to neurologic attention in March with  several days of progressive left sided weakness and seizures, characterized by twitching of the left leg.  CNS imaging demonstrated a right frontal mass.  He underwent craniotomy and resection with Dr. PooAnnette Stable 05/16/21; path demonstrated high grade glioma IDHwt.  Following surgery his left leg was much weaker, though it has improved with aggressive rehab efforts.  Currently he is using a cane to ambulate.  He has no other complaints, continues on decadron 2mg26mice per day.  No seizures since surgery.  Medications: Current Outpatient Medications on File Prior to Visit  Medication Sig Dispense Refill   acetaminophen (TYLENOL) 500 MG tablet Take 1,000 mg by mouth every 6 (six) hours as needed for moderate pain.     albuterol (VENTOLIN HFA) 108 (90 Base) MCG/ACT inhaler Inhale 1 puff into the lungs every 4 (four) hours as needed.     levETIRAcetam (KEPPRA) 500 MG tablet TAKE 2 TABLETS(1000 MG) BY MOUTH TWICE DAILY 360 tablet 0   losartan (COZAAR) 50 MG tablet Take 50 mg by mouth daily.  6   mirtazapine (REMERON) 15 MG tablet Take 15 mg by mouth at bedtime.     ondansetron (ZOFRAN) 8 MG tablet Take 1 tablet (8 mg total) by mouth every 8 (eight) hours as needed for nausea or vomiting. 30 tablet 1   rivaroxaban (XARELTO) 20 MG TABS tablet Take 1 tablet (20 mg total) by mouth daily. 90 tablet 1   traMADol (ULTRAM) 50 MG tablet Take 1 tablet (50 mg total) by mouth every 6 (six) hours as needed for severe  pain. 30 tablet 0   No current facility-administered medications on file prior to visit.    Allergies:  Allergies  Allergen Reactions   Amiodarone     AMIODARONE ANALOGUES Hyperthyroidism   Morphine And Related Itching and Palpitations    Chest pressure/palpitations   Past Medical History:  Past Medical History:  Diagnosis Date   Anxiety    Atrial fibrillation ([place])    a. s/p RFCA x 2 (10/26/2011, 04/15/2012)   Atrial flutter (HCC)    Chronic kidney disease    Clotting disorder ([place])     Colitis    Current smoker    Diverticulitis    Finger near amputation, left    Hyperthyroidism    Secondary to amiodarone   LA thrombus    Non-ischemic cardiomyopathy ([place])    OSA on CPAP    Sinus trouble    Sleep apnea    Systolic heart failure (HCC)    EF 20-25%-> 50-55% in 10/2011. MOST RECENT ECHO (03/2012) EF= 66%   Past Surgical History:  Past Surgical History:  Procedure Laterality Date   APPLICATION OF CRANIAL NAVIGATION Right 05/16/2021   Procedure: APPLICATION OF CRANIAL NAVIGATION;  Surgeon: Earnie Larsson, MD;  Location: [facility];  Service: Neurosurgery;  Laterality: Right;   ATRIAL ABLATION SURGERY     CARDIAC CATHETERIZATION  08/2011   [facility]   CHOLECYSTECTOMY     CRANIOTOMY Right 05/16/2021   Procedure: Craniotomy - right - Parietal with  brainlab;  Surgeon: Earnie Larsson, MD;  Location: [facility];  Service: Neurosurgery;  Laterality: Right;   FINGER SURGERY     LEFT HEART CATHETERIZATION WITH CORONARY ANGIOGRAM N/A 09/12/2011   Procedure: LEFT HEART CATHETERIZATION WITH CORONARY ANGIOGRAM;  Surgeon: Hillary Bow, MD;  Location[facility];  Service: Cardiovascular;  Laterality: N/A;   VASECTOMY     Social History:  Social History   Socioeconomic History   Marital status: Married    Spouse name: tanya   Number of children: 2   Years of education: Not on file   Highest education level: Some college, no degree  Occupational History   Occupation: SELF EMPLOYED    Comment: Nurse, 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  Occas tea   Social Determinants of Health   Financial Resource Strain: Not on file  Food Insecurity: Not on file  Transportation Needs: Not on file  Physical Activity: Not on file  Stress: Not on file  Social Connections: Not on file  Intimate Partner Violence: Not on file   Family History:  Family History  Problem Relation Age of Onset   Healthy Mother        age 39   Melanoma Mother    Melanoma Father    Atrial fibrillation Father        age 66   Diabetes Maternal Grandmother    Coronary artery disease Paternal  Grandmother    Heart disease Paternal Grandmother    Heart attack Paternal Grandmother    Coronary artery disease Paternal Grandfather    Heart disease Paternal Grandfather    Heart attack Paternal Grandfather    Atrial fibrillation Paternal Aunt    Atrial fibrillation Paternal Uncle    Colon cancer Neg Hx    Esophageal cancer Neg Hx    Ovarian cancer Neg Hx    Stomach cancer Neg Hx    Rectal cancer Neg Hx     Review of Systems: Constitutional: Doesn't report fevers, chills or abnormal weight loss Eyes: Doesn't report blurriness of vision Ears, nose, mouth, throat, and face: Doesn't report sore throat Respiratory: Doesn't report cough, dyspnea or wheezes Cardiovascular: Doesn't report palpitation, chest discomfort  Gastrointestinal:  Doesn't report nausea, constipation, diarrhea GU: Doesn't report incontinence Skin: Doesn't report skin rashes Neurological: Per HPI Musculoskeletal: Doesn't report joint pain Behavioral/Psych: Doesn't report anxiety  Physical Exam: Vitals:   04/13/22 1355  BP: 125/82  Pulse: (!) 48  Resp: 20  Temp: 98.1 F (36.7 C)  SpO2: 100%   KPS: 80. General: Alert, cooperative, pleasant, in no acute distress Head: Normal EENT: No conjunctival injection or scleral icterus.  Lungs: Resp effort normal Cardiac: Regular rate Abdomen: Non-distended abdomen Skin: No rashes cyanosis or petechiae. Extremities: No clubbing or  edema  Neurologic Exam: Mental Status: Awake, alert, attentive to examiner. Oriented to self and environment. Language is fluent with intact comprehension.  Cranial Nerves: Visual acuity is grossly normal. Visual fields are full. Extra-ocular movements intact. No ptosis. Face is symmetric Motor: Tone and bulk are normal. Power is 4/5 in left leg, subtle drift in left arm. Reflexes are symmetric, no pathologic reflexes present.  Sensory: Intact to light touch Gait: Hemiparetic, cane assisted.   Labs: I have reviewed the data as listed    Component Value Date/Time   NA 140 04/13/2022 1309   NA 140 03/29/2017 1045   K 4.4 04/13/2022 1309   CL 107 04/13/2022 1309   CO2 25 04/13/2022 1309   GLUCOSE 94 04/13/2022 1309   BUN 18 04/13/2022 1309   BUN 18 03/29/2017 1045   CREATININE 1.21 04/13/2022 1309   CALCIUM 9.6 04/13/2022 1309   PROT 6.6 04/13/2022 1309   PROT 7.7 03/29/2017 1045   ALBUMIN 4.3 04/13/2022 1309   ALBUMIN 5.2 03/29/2017 1045   AST 13 (L) 04/13/2022 1309   ALT 11 04/13/2022 1309   ALKPHOS 48 04/13/2022 1309   BILITOT 0.8 04/13/2022 1309   GFRNONAA >60 04/13/2022 1309   GFRAA 56 (L) 03/29/2017 1045   Lab Results  Component Value Date   WBC 7.3 04/13/2022   NEUTROABS 5.1 04/13/2022   HGB 14.3 04/13/2022   HCT 39.5 04/13/2022   MCV 99.0 04/13/2022   PLT 125 (L) 04/13/2022    Assessment/Plan High grade glioma not classifiable by WHO criteria ([place])  Glioblastoma multiforme of frontal lobe ([place]) - Plan: Clinic Appointment Request  Focal seizures ([place])  Corrin Richards is clinically stable today from neurologic standpoint, labs are within normal limits.  Arthalgias are very severe, and we are increasingly concerned this is secondary to avastin.    See: Melina Copa, et al. Arthralgia in patients with ovarian cancer treated with bevacizumab and chemotherapyInternational Journal of Gynecologic Cancer Published Online First: 09 October 2018.    Will con't with cycle #3 CCNU 58m/m2 PO q6 weeks, now day 21/42, in addition to avastin 151mkg IV q2 weeks.  Avastin will be utilized because of high growth rate and tumor location near eloquent motor structures.  We reviewed side effects of CCNU, including nausea/vomiting, cytopenias.  Effects of avastin could include hypertension, bleedin/clotting, wound healing impairment.  We will check blood counts every 2 weeks concurrent with avastin pre-treat visits.     Avastin will be held this cycle due to severe arthralgias.  Chemotherapy should be held for the following:  ANC less than 1,000  Platelets less than 100,000  LFT or creatinine greater than 2x ULN  If clinical concerns/contraindications develop  Avastin should be held for the following:  ANC less than 500  Platelets less than 50,000  LFT or creatinine greater than 2x ULN  If clinical concerns/contraindications develop  Keppra will remain at 1085m BID.  May con't Tramadol 102mq8 PRN for breakthrough pain.  We ask that Jerry Parkereturn to clinic in 2 weeks with MRI for review, or sooner as needed.    All questions were answered. The patient knows to call the clinic with any problems, questions or concerns. No barriers to learning were detected.  The total time spent in the encounter was 40 minutes and more than 50% was on counseling and review of test results   ZaVentura SellersMD Medical Director of Neuro-Oncology [facility] [place]2/15/24 1:53 PM

## 2022-04-19 ENCOUNTER — Other Ambulatory Visit (HOSPITAL_COMMUNITY): Payer: Self-pay

## 2022-04-19 ENCOUNTER — Encounter: Payer: Self-pay | Admitting: Internal Medicine

## 2022-04-20 ENCOUNTER — Ambulatory Visit (HOSPITAL_COMMUNITY)
Admission: RE | Admit: 2022-04-20 | Discharge: 2022-04-20 | Disposition: A | Discharge status: Home / Self Care | Payer: Commercial Managed Care - HMO | Source: Ambulatory Visit | Attending: Internal Medicine | Admitting: Internal Medicine

## 2022-04-20 ENCOUNTER — Other Ambulatory Visit (HOSPITAL_COMMUNITY): Payer: Self-pay

## 2022-04-20 DIAGNOSIS — C719 Malignant neoplasm of brain, unspecified: Secondary | ICD-10-CM | POA: Insufficient documentation

## 2022-04-20 MED ORDER — GADOBUTROL 1 MMOL/ML IV SOLN
10.0000 mL | Freq: Once | INTRAVENOUS | Status: AC | PRN
  Administered 2022-04-20: 10 mL via INTRAVENOUS

## 2022-04-22 ENCOUNTER — Other Ambulatory Visit: Payer: Self-pay

## 2022-04-23 ENCOUNTER — Other Ambulatory Visit: Payer: Self-pay

## 2022-04-24 ENCOUNTER — Inpatient Hospital Stay: Payer: Commercial Managed Care - HMO

## 2022-04-25 NOTE — Telephone Encounter (Signed)
Oral Oncology Patient Advocate Encounter  Spoke with patient's wife, Lavella Lemons, to confirm the email I sent regarding POI had been received. Lavella Lemons confirmed she received my email.  If she does not have a chance to email the POI back to me, she states she will bring it with her to the patient's appointment on 04/27/22.  [name], CPhT-Adv Oncology Pharmacy Patient [place] Direct Number: [phone]  Fax: [phone]

## 2022-04-27 ENCOUNTER — Other Ambulatory Visit (HOSPITAL_COMMUNITY): Payer: Self-pay

## 2022-04-27 ENCOUNTER — Inpatient Hospital Stay: Payer: Commercial Managed Care - HMO

## 2022-04-27 ENCOUNTER — Other Ambulatory Visit: Payer: Self-pay

## 2022-04-27 ENCOUNTER — Inpatient Hospital Stay: Payer: Commercial Managed Care - HMO | Admitting: Internal Medicine

## 2022-04-27 VITALS — BP 113/81 | HR 50 | Temp 98.1°F | Resp 18

## 2022-04-27 VITALS — BP 125/94 | HR 51 | Temp 97.7°F | Resp 20 | Wt 204.8 lb

## 2022-04-27 DIAGNOSIS — C711 Malignant neoplasm of frontal lobe: Secondary | ICD-10-CM

## 2022-04-27 DIAGNOSIS — C719 Malignant neoplasm of brain, unspecified: Secondary | ICD-10-CM

## 2022-04-27 DIAGNOSIS — Z5112 Encounter for antineoplastic immunotherapy: Secondary | ICD-10-CM | POA: Diagnosis not present

## 2022-04-27 LAB — CBC WITH DIFFERENTIAL (CANCER CENTER ONLY)
Abs Immature Granulocytes: 0.02 10*3/uL (ref 0.00–0.07)
Basophils Absolute: 0.1 10*3/uL (ref 0.0–0.1)
Basophils Relative: 1 %
Eosinophils Absolute: 0.2 10*3/uL (ref 0.0–0.5)
Eosinophils Relative: 2 %
HCT: 39 % (ref 39.0–52.0)
Hemoglobin: 14.1 g/dL (ref 13.0–17.0)
Immature Granulocytes: 0 %
Lymphocytes Relative: 26 %
Lymphs Abs: 1.8 10*3/uL (ref 0.7–4.0)
MCH: 37.1 pg — ABNORMAL HIGH (ref 26.0–34.0)
MCHC: 36.2 g/dL — ABNORMAL HIGH (ref 30.0–36.0)
MCV: 102.6 fL — ABNORMAL HIGH (ref 80.0–100.0)
Monocytes Absolute: 0.6 10*3/uL (ref 0.1–1.0)
Monocytes Relative: 10 %
Neutro Abs: 4.1 10*3/uL (ref 1.7–7.7)
Neutrophils Relative %: 61 %
Platelet Count: 216 10*3/uL (ref 150–400)
RBC: 3.8 MIL/uL — ABNORMAL LOW (ref 4.22–5.81)
RDW: 14.7 % (ref 11.5–15.5)
WBC Count: 6.7 10*3/uL (ref 4.0–10.5)
nRBC: 0 % (ref 0.0–0.2)

## 2022-04-27 LAB — CMP (CANCER CENTER ONLY)
ALT: 12 U/L (ref 0–44)
AST: 11 U/L — ABNORMAL LOW (ref 15–41)
Albumin: 4.2 g/dL (ref 3.5–5.0)
Alkaline Phosphatase: 54 U/L (ref 38–126)
Anion gap: 7 (ref 5–15)
BUN: 23 mg/dL — ABNORMAL HIGH (ref 6–20)
CO2: 26 mmol/L (ref 22–32)
Calcium: 9.1 mg/dL (ref 8.9–10.3)
Chloride: 109 mmol/L (ref 98–111)
Creatinine: 1.26 mg/dL — ABNORMAL HIGH (ref 0.61–1.24)
GFR, Estimated: >60 mL/min (ref >60)
Glucose, Bld: 80 mg/dL (ref 70–99)
Potassium: 3.4 mmol/L — ABNORMAL LOW (ref 3.5–5.1)
Sodium: 142 mmol/L (ref 135–145)
Total Bilirubin: 0.5 mg/dL (ref 0.3–1.2)
Total Protein: 6.1 g/dL — ABNORMAL LOW (ref 6.5–8.1)

## 2022-04-27 LAB — TOTAL PROTEIN, URINE DIPSTICK: Protein, ur: NEGATIVE mg/dL

## 2022-04-27 MED ORDER — LOMUSTINE 100 MG PO CAPS
200.0000 mg | ORAL_CAPSULE | Freq: Once | ORAL | 0 refills | Status: AC
  Filled 2022-04-27: qty 2, 1d supply, fill #0

## 2022-04-27 MED ORDER — SODIUM CHLORIDE 0.9 % IV SOLN: Freq: Once | INTRAVENOUS | Status: AC

## 2022-04-27 MED ORDER — SODIUM CHLORIDE 0.9 % IV SOLN
10.0000 mg/kg | Freq: Once | INTRAVENOUS | Status: AC
  Administered 2022-04-27: 900 mg via INTRAVENOUS
  Filled 2022-04-27: qty 4

## 2022-04-27 MED ORDER — PREDNISONE 50 MG PO TABS: 50.0000 mg | ORAL_TABLET | Freq: Every day | ORAL | 0 refills | Status: DC

## 2022-04-27 NOTE — Progress Notes (Signed)
[facility] [place] at [place],  [phone]   Interval Evaluation  Date of Service: 04/27/22 Patient Name: Jerry Richards Patient MRN: [id number] Patient DOB: 1970-09-14 Provider: Ventura Sellers, MD  Identifying Statement:  Jerry Richards is a 52 y.o. male with right frontal glioblastoma   Oncologic History: Oncology History  High grade glioma not classifiable by WHO criteria ([place])  05/16/2021 Surgery   Craniotomy, R parafalcine resection with Dr. Annette Stable; path is high grade glioma IDHwt   06/20/2021 - 06/20/2021 Chemotherapy   Patient is on Treatment Plan : BRAIN GLIOBLASTOMA Radiation Therapy With Concurrent Temozolomide 75 mg/m2 Daily Followed By Sequential Maintenance Temozolomide x 6-12 cycles     09/05/2021 - 12/17/2021 Chemotherapy   Completes 3 cycles of adjuvant 5-day Temodar   12/18/2021 Progression   Progression of disease #1   12/29/2021 -  Chemotherapy   Initiates second line therapy with CCNU '90mg'$ /m2 q6 weeks, and Avastin '10mg'$ /kg IV q2 weeks   Glioblastoma multiforme of frontal lobe ([place])  09/12/2021 Initial Diagnosis   Glioblastoma multiforme of frontal lobe ([place])   12/29/2021 -  Chemotherapy   Patient is on Treatment Plan : BRAIN GBM Bevacizumab q14d       Biomarkers:  MGMT Unknown.  IDH 1/2 Wild type.  EGFR Unknown  TERT Unknown   Interval History: Jerry Richards presents today with MRI brain for review.  Shoulder pain is completely resolved since he started dosing prednisone '50mg'$  daily 5 days ago.  No longer needing tramadol.  Denies any recurrence of seizures, no headaches.  Leg symptoms are stable.    H+P (06/07/21): Patient presented to neurologic attention in March with several days of progressive left sided weakness and seizures, characterized by twitching of the left leg.  CNS imaging demonstrated a right frontal mass.  He underwent craniotomy and resection with Dr. Annette Stable on 05/16/21;  path demonstrated high grade glioma IDHwt.  Following surgery his left leg was much weaker, though it has improved with aggressive rehab efforts.  Currently he is using a cane to ambulate.  He has no other complaints, continues on decadron '2mg'$  twice per day.  No seizures since surgery.  Medications: Current Outpatient Medications on File Prior to Visit  Medication Sig Dispense Refill   acetaminophen (TYLENOL) 500 MG tablet Take 1,000 mg by mouth every 6 (six) hours as needed for moderate pain.     albuterol (VENTOLIN HFA) 108 (90 Base) MCG/ACT inhaler Inhale 1 puff into the lungs every 4 (four) hours as needed.     levETIRAcetam (KEPPRA) 500 MG tablet TAKE 2 TABLETS(1000 MG) BY MOUTH TWICE DAILY 360 tablet 0   losartan (COZAAR) 50 MG tablet Take 50 mg by mouth daily.  6   mirtazapine (REMERON) 15 MG tablet Take 15 mg by mouth at bedtime.     ondansetron (ZOFRAN) 8 MG tablet Take 1 tablet (8 mg total) by mouth every 8 (eight) hours as needed for nausea or vomiting. 30 tablet 1   rivaroxaban (XARELTO) 20 MG TABS tablet Take 1 tablet (20 mg total) by mouth daily. 90 tablet 1   traMADol (ULTRAM) 50 MG tablet Take 1 tablet (50 mg total) by mouth every 6 (six) hours as needed for severe pain. (Patient not taking: Reported on 04/27/2022) 30 tablet 0   No current facility-administered medications on file prior to visit.    Allergies:  Allergies  Allergen Reactions   Amiodarone  AMIODARONE ANALOGUES Hyperthyroidism   Morphine And Related Itching and Palpitations    Chest pressure/palpitations   Past Medical History:  Past Medical History:  Diagnosis Date   Anxiety    Atrial fibrillation ([place])    a. 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 MOST RECENT ECHO (03/2012) EF= 66%   Past Surgical History:  Past Surgical History:  Procedure Laterality Date   APPLICATION OF CRANIAL NAVIGATION Right 05/16/2021   Procedure: APPLICATION OF CRANIAL NAVIGATION;  Surgeon: Earnie Larsson, MD;  Location: [facility];  Service: Neurosurgery;  Laterality: Right;   ATRIAL ABLATION SURGERY     CARDIAC CATHETERIZATION  08/2011   [facility]   CHOLECYSTECTOMY     CRANIOTOMY Right 05/16/2021   Procedure: Craniotomy - right - Parietal with  brainlab;  Surgeon: Earnie Larsson, MD;  Location: [facility];  Service: Neurosurgery;  Laterality: Right;   FINGER SURGERY     LEFT HEART CATHETERIZATION WITH CORONARY ANGIOGRAM N/A 09/12/2011   Procedure: LEFT HEART CATHETERIZATION WITH CORONARY ANGIOGRAM;  Surgeon: Hillary Bow, MD;  Location: [facility] CATH LAB;  Service: Cardiovascular;  Laterality: N/A;   VASECTOMY     Social History:  Social History   Socioeconomic History   Marital status: Married    Spouse name: tanya   Number of children: 2   Years of education: Not on file   Highest education level: Some college, no degree  Occupational History   Occupation: SELF EMPLOYED    Comment: Nurse, 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 Occas tea   Social Determinants of Health   Financial Resource Strain: Not on file  Food Insecurity: Not on file  Transportation Needs: Not on file  Physical Activity: Not  on file  Stress: Not on file  Social Connections: Not on file  Intimate Partner Violence: Not on file   Family History:  Family History  Problem Relation Age of Onset   Healthy Mother        age 15   Melanoma Mother    Melanoma Father    Atrial fibrillation Father        age 54   Diabetes Maternal Grandmother    Coronary artery disease Paternal Grandmother    Heart disease Paternal Grandmother    Heart attack Paternal Grandmother    Coronary artery disease Paternal Grandfather    Heart disease Paternal Grandfather  Heart attack Paternal Grandfather    Atrial fibrillation Paternal Aunt    Atrial fibrillation Paternal Uncle    Colon cancer Neg Hx    Esophageal cancer Neg Hx    Ovarian cancer Neg Hx    Stomach cancer Neg Hx    Rectal cancer Neg Hx     Review of Systems: Constitutional: Doesn't report fevers, chills or abnormal weight loss Eyes: Doesn't report blurriness of vision Ears, nose, mouth, throat, and face: Doesn't report sore throat Respiratory: Doesn't report cough, dyspnea or wheezes Cardiovascular: Doesn't report palpitation, chest discomfort  Gastrointestinal:  Doesn't report nausea, constipation, diarrhea GU: Doesn't report incontinence Skin: Doesn't report skin rashes Neurological: Per HPI Musculoskeletal: Doesn't report joint pain Behavioral/Psych: Doesn't report anxiety  Physical Exam: Vitals:   04/27/22 1055  BP: (!) 125/94  Pulse: (!) 51  Resp: 20  Temp: 97.7 F (36.5 C)  SpO2: 100%   KPS: 80. General: Alert, cooperative, pleasant, in no acute distress Head: Normal EENT: No conjunctival injection or scleral icterus.  Lungs: Resp effort normal Cardiac: Regular rate Abdomen: Non-distended abdomen Skin: No rashes cyanosis or petechiae. Extremities: No clubbing or edema  Neurologic Exam: Mental Status: Awake, alert, attentive to examiner. Oriented to self and environment. Language is fluent with intact comprehension.  Cranial Nerves:  Visual acuity is grossly normal. Visual fields are full. Extra-ocular movements intact. No ptosis. Face is symmetric Motor: Tone and bulk are normal. Power is 4/5 in left leg, subtle drift in left arm. Reflexes are symmetric, no pathologic reflexes present.  Sensory: Intact to light touch Gait: Hemiparetic, cane assisted.   Labs: I have reviewed the data as listed    Component Value Date/Time   NA 142 04/27/2022 1054   NA 140 03/29/2017 1045   K 3.4 (L) 04/27/2022 1054   CL 109 04/27/2022 1054   CO2 26 04/27/2022 1054   GLUCOSE 80 04/27/2022 1054   BUN 23 (H) 04/27/2022 1054   BUN 18 03/29/2017 1045   CREATININE 1.26 (H) 04/27/2022 1054   CALCIUM 9.1 04/27/2022 1054   PROT 6.1 (L) 04/27/2022 1054   PROT 7.7 03/29/2017 1045   ALBUMIN 4.2 04/27/2022 1054   ALBUMIN 5.2 03/29/2017 1045   AST 11 (L) 04/27/2022 1054   ALT 12 04/27/2022 1054   ALKPHOS 54 04/27/2022 1054   BILITOT 0.5 04/27/2022 1054   GFRNONAA >60 04/27/2022 1054   GFRAA 56 (L) 03/29/2017 1045   Lab Results  Component Value Date   WBC 6.7 04/27/2022   NEUTROABS 4.1 04/27/2022   HGB 14.1 04/27/2022   HCT 39.0 04/27/2022   MCV 102.6 (H) 04/27/2022   PLT 216 04/27/2022   Imaging:  [facility] Clinician Interpretation: I have personally reviewed the CNS images as listed.  My interpretation, in the context of the patient's clinical presentation, is progressive disease  MR BRAIN W WO CONTRAST  Result Date: 04/23/2022 CLINICAL DATA:  Provided history: High-grade glioma not classifiable by WHO criteria. Brain/CNS neoplasm, assess treatment response. EXAM: MRI HEAD WITHOUT AND WITH CONTRAST TECHNIQUE: Multiplanar, multiecho pulse sequences of the brain and surrounding structures were obtained without and with intravenous contrast. CONTRAST:  88m GADAVIST GADOBUTROL 1 MMOL/ML IV SOLN COMPARISON:  Prior brain MRI examinations 02/02/2022 and earlier. FINDINGS: Brain: Redemonstrated postsurgical changes from prior mass resection  within the posteromedial right frontal lobe and adjacent right parietal lobe (underlying a parietal cranioplasty). Non-acute pre-contrast T1 hyperintense blood products along the periphery of the resection cavity have increased. A curvilinear focus of enhancement  along the posteromedial aspect of the resection cavity has remained stable. The previously described punctate focus of enhancement along the anterior aspect of the resection cavity is no longer appreciated. Restricted diffusion about the resection cavity has slightly increased (most notable medially) since the prior brain MRI of 02/02/2022, and remains significantly increased in extent from the prior brain MRI of 12/16/2021. Additionally, surrounding T2 FLAIR hyperintense signal abnormality within the right cerebral hemisphere has mildly increased in extent from the most recent exam (most notable anteriorly). No increased mass effect is noted. Non-acute pre-contrast T1 hyperintense blood products at site of a lesion centered in the right periatrial white matter have increased. Irregular enhancement at this site is similar to the prior exam. Restricted diffusion at this site has slightly increased from the prior exam, and remains significantly increased from the prior MRI of 12/16/2021. T2 FLAIR hyperintense signal abnormality associated with this lesion is similar to slightly increased from the most recent prior study. No increased mass effect is noted. The expansile T2 FLAIR hyperintense lesion within the anterior aspect of the right temporal lobe (involving the cortex and subcortical white matter) has increased in size, now measuring 3.5 x 3.1 cm in transaxial dimensions (previously 2.8 x 2.7 cm). There is no restricted diffusion or enhancement at this site. Mild multifocal T2 FLAIR hyperintense signal abnormality elsewhere within the cerebral white matter, nonspecific but possibly reflecting mild changes of chronic small vessel ischemia. No extra-axial  fluid collection. No midline shift. Vascular: Maintained flow voids within the proximal large arterial vessels. Left frontal lobe developmental venous anomaly (an anatomic variant). Skull and upper cervical spine: Biparietal cranioplasty. No focal suspicious marrow lesion. Sinuses/Orbits: No mass or acute finding within the imaged orbits. 10 mm mucous retention cyst within the right maxillary sinus. Minimal mucosal thickening within bilateral ethmoid air cells. These results will be called to the ordering clinician or representative by the Radiologist Assistant, and communication documented in the PACS or [employer]. IMPRESSION: 1. Restricted diffusion surrounding the right frontoparietal resection cavity has slightly increased in extent from the prior brain MRI of 02/02/2022, and remains significantly increased in extent from the prior brain MRI of 12/16/2021. Additionally, surrounding T2 FLAIR hyperintense signal abnormality has slightly increased. Progressive non-acute hemorrhage at this site without progressive enhancement or new mass effect. 2. Restricted diffusion associated with the lesion centered within the right periatrial white matter has slightly increased from the prior brain MRI of 02/02/2022, and remains significantly increased in extent from the prior brain MRI of 12/16/2021. T2 FLAIR hyperintense signal abnormality at this site is similar to slightly increased. Progressive non-acute hemorrhage at this site without progressive enhancement or new mass effect. 3. The non-enhancing expansile T2 FLAIR hyperintense lesion within the anterior aspect of the right temporal lobe has increased in size from the prior exam, now measuring 3.5 x 3.1 cm in transaxial dimensions (previously 2.8 x 2.7 cm). This may reflect infiltrative tumor (possible glioma) or encephalitis. Electronically Signed   By: Kellie Simmering D.O.   On: 04/23/2022 13:29     Assessment/Plan Glioblastoma multiforme of frontal lobe  [facility]) - Plan: Clinic Appointment Request  Jerry Richards is clinically stable today, labs are within normal limits.  Arthalgias are resolved with oral steroids, potentially indicating a gout flare.   MRI brain demonstrates mostly stable findings within previously active sites of disease.  There is some increase in T2/FLAIR signal abnormality which does not appear tumorigenic.  Within anterior right frontal lobe, there is ongoing infiltration  of what we suspect is under-treated gliomatosis.  This are is not within prior RT field.  We discussed possibility of treating this area with salvage radiation, such as SRS or fractionated SRS.  He is agreeable to this, we will discuss further with Dr. Isidore Moos.  If and when RT is completed, will con't with cycle #4 CCNU '90mg'$ /m2 PO q6 weeks, in addition to avastin '10mg'$ /kg IV q2 weeks.  Avastin will be utilized because of high growth rate and tumor location near eloquent motor structures.  We reviewed side effects of CCNU, including nausea/vomiting, cytopenias.  Effects of avastin could include hypertension, bleedin/clotting, wound healing impairment.  We will check blood counts every 2 weeks concurrent with avastin pre-treat visits.     Avastin will be administered today as scheduled.  Chemotherapy should be held for the following:  ANC less than 1,000  Platelets less than 100,000  LFT or creatinine greater than 2x ULN  If clinical concerns/contraindications develop  Avastin should be held for the following:  ANC less than 500  Platelets less than 50,000  LFT or creatinine greater than 2x ULN  If clinical concerns/contraindications develop  Recommended an additional 5 days of prednisone '50mg'$  daily.   Keppra will remain at '1000mg'$  BID.  May con't Tramadol '100mg'$  q8 PRN for breakthrough pain.  We ask that [name] return to clinic in 2 weeks for avastin, or sooner as needed.    All questions were answered. The patient knows to call the  clinic with any problems, questions or concerns. No barriers to learning were detected.  The total time spent in the encounter was 40 minutes and more than 50% was on counseling and review of test results   Ventura Sellers, MD Medical Director of Neuro-Oncology [facility] at [place] 04/27/22 2:40 PM

## 2022-04-27 NOTE — Patient Instructions (Signed)
Jerry Richards  Discharge Instructions: Thank you for choosing [facility] to provide your oncology and hematology care.   If you have a lab appointment with the [facility], please go directly to the [facility] and check in at the registration area.   Wear comfortable clothing and clothing appropriate for easy access to any Portacath or PICC line.   We strive to give you quality time with your provider. You may need to reschedule your appointment if you arrive late (15 or more minutes).  Arriving late affects you and other patients whose appointments are after yours.  Also, if you miss three or more appointments without notifying the office, you may be dismissed from the clinic at the provider's discretion.      For prescription refill requests, have your pharmacy contact our office and allow 72 hours for refills to be completed.    Today you received the following chemotherapy and/or immunotherapy agents: MVASI      To help prevent nausea and vomiting after your treatment, we encourage you to take your nausea medication as directed.  BELOW ARE SYMPTOMS THAT SHOULD BE REPORTED IMMEDIATELY: *FEVER GREATER THAN 100.4 F (38 C) OR HIGHER *CHILLS OR SWEATING *NAUSEA AND VOMITING THAT IS NOT CONTROLLED WITH YOUR NAUSEA MEDICATION *UNUSUAL SHORTNESS OF BREATH *UNUSUAL BRUISING OR BLEEDING *URINARY PROBLEMS (pain or burning when urinating, or frequent urination) *BOWEL PROBLEMS (unusual diarrhea, constipation, pain near the anus) TENDERNESS IN MOUTH AND THROAT WITH OR WITHOUT PRESENCE OF ULCERS (sore throat, sores in mouth, or a toothache) UNUSUAL RASH, SWELLING OR PAIN  UNUSUAL VAGINAL DISCHARGE OR ITCHING   Items with * indicate a potential emergency and should be followed up as soon as possible or go to the Emergency Department if any problems should occur.  Please show the CHEMOTHERAPY ALERT CARD or IMMUNOTHERAPY ALERT CARD at check-in  to the Emergency Department and triage nurse.  Should you have questions after your visit or need to cancel or reschedule your appointment, please contact [facility]  Dept: [phone]  and follow the prompts.  Office hours are 8:00 a.m. to 4:30 p.m. Monday - Friday. Please note that voicemails left after 4:00 p.m. may not be returned until the following business day.  We are closed weekends and major holidays. You have access to a nurse at all times for urgent questions. Please call the main number to the clinic Dept: [phone] and follow the prompts.   For any non-urgent questions, you may also contact your provider using MyChart. We now offer e-Visits for anyone 62 and older to request care online for non-urgent symptoms. For details visit [email or web address].   Also download the MyChart app! Go to the app store, search "MyChart", open the app, select [facility], and log in with your MyChart username and password.  Masks are optional in the cancer centers. If you would like for your care team to wear a mask while they are taking care of you, please let them know. You may have one support person who is at least 54 years old accompany you for your appointments.

## 2022-04-28 ENCOUNTER — Telehealth: Payer: Self-pay | Admitting: Radiation Therapy

## 2022-04-28 ENCOUNTER — Other Ambulatory Visit: Payer: Self-pay | Admitting: Radiation Therapy

## 2022-04-28 ENCOUNTER — Other Ambulatory Visit (HOSPITAL_COMMUNITY): Payer: Self-pay

## 2022-04-28 DIAGNOSIS — C719 Malignant neoplasm of brain, unspecified: Secondary | ICD-10-CM

## 2022-04-28 NOTE — Telephone Encounter (Signed)
Left a detailed message for pt about his upcoming brain MRI and visit with Dr. Isidore Moos. My contact information was included with a request to call back for further discussion.   Mont Dutton R.T.(R)(T) Radiation Special Procedures Navigator

## 2022-05-01 NOTE — Telephone Encounter (Signed)
Oral Oncology Patient Advocate Encounter   Submitted application for assistance for Gleostine to [employer].   Application submitted via e-fax to [phone].  Proof of disability income will be submitted once received from patient.   [employer] phone number [phone].   I will continue to check the status until final determination.   [name], CPhT-Adv Oncology Pharmacy Patient [facility] Direct Number: [phone]  Fax: [phone]

## 2022-05-01 NOTE — Telephone Encounter (Signed)
Oral Oncology Patient Advocate Encounter   Received notification that the application for assistance for Gleostine through [place] has been approved.   [employer] phone number [phone].   Effective dates: 05/01/22 through 6 cycles of treatment  Jerry Richards, [facility] [place] Direct Number: [phone]  Fax: [phone]

## 2022-05-02 NOTE — Progress Notes (Signed)
Location/Histology of Brain Tumor: Glioblastoma multiforme of frontal lobe (HCC)     MRI of Brain w/ & w/o Contrast  04/20/2022 --IMPRESSION: Restricted diffusion surrounding the right frontoparietal resection cavity has slightly increased in extent from the prior brain MRI of 02/02/2022, and remains significantly increased in extent from the prior brain MRI of 12/16/2021. Additionally, surrounding T2 FLAIR hyperintense signal abnormality has slightly increased. Progressive non-acute hemorrhage at this site without progressive enhancement or new mass effect. Restricted diffusion associated with the lesion centered within the right periatrial white matter has slightly increased from the prior brain MRI of 02/02/2022, and remains significantly increased in extent from the prior brain MRI of 12/16/2021. T2 FLAIR hyperintense signal abnormality at this site is similar to slightly increased. Progressive non-acute hemorrhage at this site without progressive enhancement or new mass effect. The non-enhancing expansile T2 FLAIR hyperintense lesion within the anterior aspect of the right temporal lobe has increased in size from the prior exam, now measuring 3.5 x 3.1 cm in transaxial dimensions (previously 2.8 x 2.7 cm). This may reflect infiltrative tumor (possible glioma) or encephalitis.  Past or anticipated interventions, if any, per neurosurgery:  He underwent craniotomy and resection with Dr. Annette Stable on 05/16/21; path demonstrated high grade glioma IDHwt.   Past or anticipated interventions, if any, per medical oncology:  Under care of Dr. Cecil Cobbs  04/27/2022 --Assessment/Plan Jerry Richards is clinically stable today, labs are within normal limits.   Arthalgias are resolved with oral steroids, potentially indicating a gout flare.  MRI brain demonstrates mostly stable findings within previously active sites of disease.   There is some increase in T2/FLAIR signal abnormality which does not appear  tumorigenic. Within anterior right frontal lobe, there is ongoing infiltration of what we suspect is under-treated gliomatosis.   This are is not within prior RT field.  We discussed possibility of treating this area with salvage radiation, such as SRS or fractionated SRS.   He is agreeable to this, we will discuss further with Dr. Isidore Moos. If and when RT is completed, will con't with cycle #4 CCNU '90mg'$ /m2 PO q6 weeks, in addition to avastin '10mg'$ /kg IV q2 weeks.   Avastin will be utilized because of high growth rate and tumor location near eloquent motor structures.   We will check blood counts every 2 weeks concurrent with avastin pre-treat visits.    Avastin will be administered today as scheduled. Recommended an additional 5 days of prednisone '50mg'$  daily.  Keppra will remain at '1000mg'$  BID. May con't Tramadol '100mg'$  q8 PRN for breakthrough pain. We ask that Jerry Richards return to clinic in 2 weeks for avastin, or sooner as needed.     Dose of Decadron, if applicable: Not currently prescribed (recently finished a 5 day course of prednisone)   Recent neurologic symptoms, if any:  Seizures: No new occurrences since craniotomy 04/2021   Headaches: Denies Nausea: Denies Dizziness/ataxia: Denies Difficulty with hand coordination: Denies Focal numbness/weakness: Denies any changes Visual deficits/changes: Denies Confusion/Memory deficits: Denies   SAFETY ISSUES: Prior radiation? Yes: 06/27/2021 through 08/08/2021 Site Technique Total Dose (Gy) Dose per Fx (Gy) Completed Fx Beam Energies  Frontal Lobe: Brain_R_front IMRT 46/46 2 23/23 6X  Frontal Lobe: Brain_Bst_R_front IMRT 14/14 2 7/7 6X   Pacemaker/ICD? No Possible current pregnancy? N/A Is the patient on methotrexate? No   Additional Complaints / other details: Reports left shoulder pain (per patient's wife likely related to Avastin). Dr. Mickeal Skinner is aware

## 2022-05-03 ENCOUNTER — Other Ambulatory Visit: Payer: Commercial Managed Care - HMO

## 2022-05-03 ENCOUNTER — Telehealth: Payer: Self-pay | Admitting: Pharmacy Technician

## 2022-05-03 ENCOUNTER — Other Ambulatory Visit: Payer: Self-pay

## 2022-05-03 NOTE — Telephone Encounter (Signed)
Oral Oncology Patient Advocate Encounter  Received first shipment of Gleostine from [employer] [facility] at the office.  Called the patient and the patient's wife to let them know it is available for pickup.  Per Dr. Mickeal Skinner, start date for this cycle is TBD after patient's radiation on 05/09/22.  [name], CPhT-Adv Oncology Pharmacy Patient [place] Direct Number: [phone]  Fax: [phone]

## 2022-05-05 ENCOUNTER — Ambulatory Visit
Admission: RE | Admit: 2022-05-05 | Discharge: 2022-05-05 | Disposition: A | Discharge status: Home / Self Care | Payer: Commercial Managed Care - HMO | Source: Ambulatory Visit | Attending: Radiation Oncology | Admitting: Radiation Oncology

## 2022-05-05 ENCOUNTER — Ambulatory Visit: Payer: Commercial Managed Care - HMO

## 2022-05-05 ENCOUNTER — Ambulatory Visit: Payer: Commercial Managed Care - HMO | Admitting: Radiation Oncology

## 2022-05-05 DIAGNOSIS — C711 Malignant neoplasm of frontal lobe: Secondary | ICD-10-CM

## 2022-05-08 NOTE — Progress Notes (Shared)
Radiation Oncology         [phone]) [phone] ________________________________  Outpatient Re-Consultation  Name: Jerry Richards MRN: [id number]  Date: 05/09/2022  DOB: 03-Jan-1971  [id number], Jerry Na, MD  [name], MD   REFERRING PHYSICIAN: [name], MD  DIAGNOSIS:  C71.2   ICD-10-CM   1. Glioblastoma multiforme of frontal lobe (HCC)  C71.1 sodium chloride flush (NS) 0.9 % injection 10 mL    2. Malignant neoplasm of temporal lobe (HCC)  C71.2        HISTORY OF PRESENT ILLNESS::Jerry Richards is a 52 y.o. male who presents today for discussion of salvage SRS to the brain as part of management for an area of suspected under-treated gliomatosis in the anterior right frontal lobe. I last met with the patient on 09/12/21 for follow-up after completing radiation to the right frontal lobe on 08/08/2021. The patient healed well from therapy. Since that time, the patient continued with chemotherapy consisting of Temozolomide (first dose on 09/05/21) under Dr. Mickeal Richards, along with serial MRI's of the brain to assess his progress.   MRI of the brain on 11/07/21 demonstrated progression of the enhancing lesion and surround edema in the right medial parietal lobe, a new 6 mm enhancing lesion in the right parietal white matter likely due to contiguous tumor spread, a new 3 mm lesion in the right medial temporal lobe posteriorly, and progression of cortical edema in the right anterior temporal lobe without abnormal enhancement (likely not related glioma spread, and possibly related to a form of encephalitis).    MRI of the brain on 12/16/21 showed 3 areas of T2 hyperintensity in the right cerebral hemisphere compatible with multifocal glioma. MRI also showed mild progression in size of the enhancing lesion and increased T2 hyperintensity, and a significant increase in T2 signal with 2 areas of necrotic pattern enhancement in the right periatrial white matter, consistent with progressive tumor. MRI  otherwise showed stable infiltrative appearance at the right anterior temporal lobe.   Given evidence of disease progression, Temozolomide was discontinued on 12/19/21. Following a detailed discussion with Dr. Julien Richards, the patient opted to proceed with second line therapy consisting of CCNU oral therapy q6 weeks, in addition to avastin infusions q2 weeks due to the high growth rate and the tumor being located near the eloquent motor structures.      The patient tolerated his first cycle of CCNU well. He denied any recurrence of seizures or headaches. He however continued to have some persistent left leg pain.   Follow-up MRI of brain on 02/02/22 showed evidence of stable disease / a mild treatment response, demonstrated by: a significant decrease in enhancement and surrounding flair signal abnormality involving the right frontal lesion near the vertex (but an increase in peripheral diffusion restriction); decreased enhancement surrounding the lesion in the right peritrigonal white matter but with increased diffusion restriction and unchanged FLAIR signal abnormality; and no change in the non-enhancing expansile FLAIR signal abnormality in the right anterior temporal lobe.   MRI of the brain following cycle 3 of CCNU + avastin on 04/20/22 demonstrated: a slight increase in extent of the restricted diffusion surrounding the right frontoparietal resection cavity; a sight increase in the surrounding T2 FLAIR hyperintense signal abnormality; a slight increase in the restricted diffusion and T2 FLAIR hyperintense signal associated with the lesion centered within the right periatrial white matter; and an interval increase in size of the non-enhancing expansile T2 FLAIR hyperintense lesion within the anterior aspect of the  right temporal lobe, measuring 3.5 x 3.1 cm, previously 2.8 x 2.7 cm.    For the ongoing infiltration of suspected under-treated gliomatosis in the anterior right frontal lobe, Dr. Mickeal Richards  recommends consideration of salvage radiation, such as SRS or fractionated SRS which we will discuss in detail today. This area was not within prior RT field.   Once RT in completed, the patient will continue with cycle 4 of CCNU + avastin.   Patient is present with his supportive wife today. He denies any headaches, nausea, vision changes or weakness. He does endorse some right forearm tingling that occurs when his arm is resting.   PREVIOUS RADIATION THERAPY: Yes   Diagnoses: C71.1-Malignant neoplasm of frontal lobe GLIOBLASTOMA Intent: Palliative Radiation Treatment Dates: 06/27/2021 through 08/08/2021 Site Technique Total Dose (Gy) Dose per Fx (Gy) Completed Fx Beam Energies  Frontal Lobe: Brain_R_front IMRT 46/46 2 23/23 6X  Frontal Lobe: Brain_Bst_R_front IMRT 14/14 2 7/7 6X    PAST MEDICAL HISTORY:  has a past medical history of Anxiety, Atrial fibrillation (HCC), Atrial flutter ([place]), Chronic kidney disease, Clotting disorder ([place]), Colitis, Current smoker, Diverticulitis, Finger near amputation, left, Hyperthyroidism, LA thrombus, Non-ischemic cardiomyopathy ([place]), OSA on CPAP, Sinus trouble, Sleep apnea, and Systolic heart failure ([place]).    PAST SURGICAL HISTORY: Past Surgical History:  Procedure Laterality Date   APPLICATION OF CRANIAL NAVIGATION Right 05/16/2021   Procedure: APPLICATION OF CRANIAL NAVIGATION;  Surgeon: Earnie Larsson, MD;  Location: [facility];  Service: Neurosurgery;  Laterality: Right;   ATRIAL ABLATION SURGERY     CARDIAC CATHETERIZATION  08/2011   [facility]   CHOLECYSTECTOMY     CRANIOTOMY Right 05/16/2021   Procedure: Craniotomy - right - Parietal with  brainlab;  Surgeon: Earnie Larsson, MD;  Location: [facility];  Service: Neurosurgery;  Laterality: Right;   FINGER SURGERY     LEFT HEART CATHETERIZATION WITH CORONARY ANGIOGRAM N/A 09/12/2011   Procedure: LEFT HEART CATHETERIZATION WITH CORONARY ANGIOGRAM;  Surgeon: Hillary Bow, MD;  Location[facility] CATH LAB;  Service:  Cardiovascular;  Laterality: N/A;   VASECTOMY      FAMILY HISTORY: family history includes Atrial fibrillation in his father, paternal aunt, and paternal uncle; Coronary artery disease in his paternal grandfather and paternal grandmother; Diabetes in his maternal grandmother; Healthy in his mother; Heart attack in his paternal grandfather and paternal grandmother; Heart disease in his paternal grandfather and paternal grandmother; Melanoma in his father and mother.  SOCIAL HISTORY:  reports that he has been smoking cigarettes. He has a 20.00 pack-year smoking history. He has never used smokeless tobacco. He reports that he does not currently use alcohol. He reports that he does not use drugs.  ALLERGIES: Amiodarone and Morphine and related  MEDICATIONS:  Current Outpatient Medications  Medication Sig Dispense Refill   acetaminophen (TYLENOL) 500 MG tablet Take 1,000 mg by mouth every 6 (six) hours as needed for moderate pain.     albuterol (VENTOLIN HFA) 108 (90 Base) MCG/ACT inhaler Inhale 1 puff into the lungs every 4 (four) hours as needed.     levETIRAcetam (KEPPRA) 500 MG tablet TAKE 2 TABLETS(1000 MG) BY MOUTH TWICE DAILY 360 tablet 0   losartan (COZAAR) 50 MG tablet Take 50 mg by mouth daily.  6   mirtazapine (REMERON) 15 MG tablet Take 15 mg by mouth at bedtime.     ondansetron (ZOFRAN) 8 MG tablet Take 1 tablet (8 mg total) by mouth every 8 (eight) hours as needed for nausea or vomiting. [place]  tablet 1   rivaroxaban (XARELTO) 20 MG TABS tablet Take 1 tablet (20 mg total) by mouth daily. 90 tablet 1   traMADol (ULTRAM) 50 MG tablet Take 1 tablet (50 mg total) by mouth every 6 (six) hours as needed for severe pain. (Patient not taking: Reported on 04/27/2022) 30 tablet 0   No current facility-administered medications for this encounter.    REVIEW OF SYSTEMS:  As above.   PHYSICAL EXAM:  height is 6' (1.829 m) and weight is 199 lb (90.3 kg). His temporal temperature is 97.2 F (36.2 C)  (abnormal). His blood pressure is 120/76 and his pulse is 75. His respiration is 18 and oxygen saturation is 100%.   General: Alert and oriented, in no acute distress HEENT: Head is normocephalic. Extraocular movements are intact. Oropharynx is clear. Neck: Neck is supple, no palpable cervical or supraclavicular lymphadenopathy. Heart: Regular in rate and rhythm with no murmurs, rubs, or gallops. Chest: Clear to auscultation bilaterally, with no rhonchi, wheezes, or rales. Abdomen: Soft, nontender, nondistended, with no rigidity or guarding. Extremities: No cyanosis or edema. Lymphatics: see Neck Exam Skin: No concerning lesions. Musculoskeletal: symmetric strength and muscle tone throughout. Patient favors the right lower extremity with ambulation.  Neurologic: Cranial nerves II through XII are grossly intact. No obvious focalities. Speech is fluent. Coordination is intact. Decreased sensation to the right lower extremity. Psychiatric: Judgment and insight are intact. Affect is appropriate.  KPS = 80  100 - Normal; no complaints; no evidence of disease. 90   - Able to carry on normal activity; minor signs or symptoms of disease. 80   - Normal activity with effort; some signs or symptoms of disease. 51   - Cares for self; unable to carry on normal activity or to do active work. 60   - Requires occasional assistance, but is able to care for most of his personal needs. 50   - Requires considerable assistance and frequent medical care. 34   - Disabled; requires special care and assistance. 38   - Severely disabled; hospital admission is indicated although death not imminent. 72   - Very sick; hospital admission necessary; active supportive treatment necessary. 10   - Moribund; fatal processes progressing rapidly. 0     - Dead  Karnofsky DA, Abelmann [place]gate, Craver LS and [facility] [phone]) The use of the nitrogen mustards in the palliative treatment of carcinoma: with particular reference to  bronchogenic carcinoma Cancer 1 634-56  LABORATORY DATA:  Lab Results  Component Value Date   WBC 6.7 04/27/2022   HGB 14.1 04/27/2022   HCT 39.0 04/27/2022   MCV 102.6 (H) 04/27/2022   PLT 216 04/27/2022   CMP     Component Value Date/Time   Richards 142 04/27/2022 1054   Richards 140 03/29/2017 1045   K 3.4 (L) 04/27/2022 1054   CL 109 04/27/2022 1054   CO2 26 04/27/2022 1054   GLUCOSE 80 04/27/2022 1054   BUN 23 (H) 04/27/2022 1054   BUN 18 03/29/2017 1045   CREATININE 1.26 (H) 04/27/2022 1054   CALCIUM 9.1 04/27/2022 1054   PROT 6.1 (L) 04/27/2022 1054   PROT 7.7 03/29/2017 1045   ALBUMIN 4.2 04/27/2022 1054   ALBUMIN 5.2 03/29/2017 1045   AST 11 (L) 04/27/2022 1054   ALT 12 04/27/2022 1054   ALKPHOS 54 04/27/2022 1054   BILITOT 0.5 04/27/2022 1054   GFRNONAA >60 04/27/2022 1054   GFRAA 56 (L) 03/29/2017 1045  RADIOGRAPHY: MR BRAIN W WO CONTRAST  Result Date: 04/23/2022 CLINICAL DATA:  Provided history: High-grade glioma not classifiable by WHO criteria. Brain/CNS neoplasm, assess treatment response. EXAM: MRI HEAD WITHOUT AND WITH CONTRAST TECHNIQUE: Multiplanar, multiecho pulse sequences of the brain and surrounding structures were obtained without and with intravenous contrast. CONTRAST:  53m GADAVIST GADOBUTROL 1 MMOL/ML IV SOLN COMPARISON:  Prior brain MRI examinations 02/02/2022 and earlier. FINDINGS: Brain: Redemonstrated postsurgical changes from prior mass resection within the posteromedial right frontal lobe and adjacent right parietal lobe (underlying a parietal cranioplasty). Non-acute pre-contrast T1 hyperintense blood products along the periphery of the resection cavity have increased. A curvilinear focus of enhancement along the posteromedial aspect of the resection cavity has remained stable. The previously described punctate focus of enhancement along the anterior aspect of the resection cavity is no longer appreciated. Restricted diffusion about the resection  cavity has slightly increased (most notable medially) since the prior brain MRI of 02/02/2022, and remains significantly increased in extent from the prior brain MRI of 12/16/2021. Additionally, surrounding T2 FLAIR hyperintense signal abnormality within the right cerebral hemisphere has mildly increased in extent from the most recent exam (most notable anteriorly). No increased mass effect is noted. Non-acute pre-contrast T1 hyperintense blood products at site of a lesion centered in the right periatrial white matter have increased. Irregular enhancement at this site is similar to the prior exam. Restricted diffusion at this site has slightly increased from the prior exam, and remains significantly increased from the prior MRI of 12/16/2021. T2 FLAIR hyperintense signal abnormality associated with this lesion is similar to slightly increased from the most recent prior study. No increased mass effect is noted. The expansile T2 FLAIR hyperintense lesion within the anterior aspect of the right temporal lobe (involving the cortex and subcortical white matter) has increased in size, now measuring 3.5 x 3.1 cm in transaxial dimensions (previously 2.8 x 2.7 cm). There is no restricted diffusion or enhancement at this site. Mild multifocal T2 FLAIR hyperintense signal abnormality elsewhere within the cerebral white matter, nonspecific but possibly reflecting mild changes of chronic small vessel ischemia. No extra-axial fluid collection. No midline shift. Vascular: Maintained flow voids within the proximal large arterial vessels. Left frontal lobe developmental venous anomaly (an anatomic variant). Skull and upper cervical spine: Biparietal cranioplasty. No focal suspicious marrow lesion. Sinuses/Orbits: No mass or acute finding within the imaged orbits. 10 mm mucous retention cyst within the right maxillary sinus. Minimal mucosal thickening within bilateral ethmoid air cells. These results will be called to the ordering  clinician or representative by the Radiologist Assistant, and communication documented in the PACS or [employer] IMPRESSION: 1. Restricted diffusion surrounding the right frontoparietal resection cavity has slightly increased in extent from the prior brain MRI of 02/02/2022, and remains significantly increased in extent from the prior brain MRI of 12/16/2021. Additionally, surrounding T2 FLAIR hyperintense signal abnormality has slightly increased. Progressive non-acute hemorrhage at this site without progressive enhancement or new mass effect. 2. Restricted diffusion associated with the lesion centered within the right periatrial white matter has slightly increased from the prior brain MRI of 02/02/2022, and remains significantly increased in extent from the prior brain MRI of 12/16/2021. T2 FLAIR hyperintense signal abnormality at this site is similar to slightly increased. Progressive non-acute hemorrhage at this site without progressive enhancement or new mass effect. 3. The non-enhancing expansile T2 FLAIR hyperintense lesion within the anterior aspect of the right temporal lobe has increased in size from the prior exam, now measuring  3.5 x 3.1 cm in transaxial dimensions (previously 2.8 x 2.7 cm). This may reflect infiltrative tumor (possible glioma) or encephalitis. Electronically Signed   By: Kellie Simmering D.O.   On: 04/23/2022 13:29      IMPRESSION/PLAN: This is a very pleasant 52 year old with progressive glioblastoma of the brain.  It was a pleasure seeing this patient again. He is well known to me as we have continued to follow him through our multidisciplinary brain and spine conference since last treating him. Most recent brain imaging demonstrates increase in T2 FLAIR abnormality at the R anterior temporal lobe, outside the previous RT fields, considering for elsewhere brain progression. We discussed his case at our multidisciplinary conference this week. It was agreed that palliative SRS to  this area of concern would decrease his risk of disease progression.  I had a lengthy discussion with the patient after reviewing their MRI results with them.  We spoke about radiosurgery to the brain. We spoke about the differing risks benefits and side effects. Side effects include, but are not limited to, hair loss, fatigue, cognitive effects, HA, and radionecrosis that can result from stereotactic radiosurgery. I explained that stereotactic radiosurgery only treats the areas of gross disease while sparing the rest of the brain parenchyma. Intent is palliative, not curative.  After lengthy discussion, the patient would like to proceed with stereotactic brain radiosurgery to their disease.CT simulation will take place today and treatment on 05/17/22.  I plan to deliver 30 Gy in 6 fractions to the T2 FLAIR abnormality in the right temporal lobe.  He and his wife would like to go to [employer] competitions in [place] after his treatment.  This is an exciting trip that they are looking forward to and they will try to schedule this about a week out from treatments that he has some time to recuperate  On date of service, in total, I spent 60 minutes on this encounter. Patient was seen in person.  Note was signed after date of encounter.  Minutes pertain to date of encounter only.   __________________________________________   Leona Singleton, PA    Eppie Gibson, MD  This document serves as a record of services personally performed by Eppie Gibson, MD. It was created on her behalf by Roney Mans, a trained medical scribe. The creation of this record is based on the scribe's personal observations and the provider's statements to them. This document has been checked and approved by the attending provider.

## 2022-05-09 ENCOUNTER — Ambulatory Visit
Admission: RE | Admit: 2022-05-09 | Discharge: 2022-05-09 | Disposition: A | Discharge status: Home / Self Care | Payer: Commercial Managed Care - HMO | Source: Ambulatory Visit | Attending: Radiation Oncology | Admitting: Radiation Oncology

## 2022-05-09 VITALS — BP 120/76 | HR 75 | Temp 97.2°F | Resp 18 | Ht 72.0 in | Wt 199.0 lb

## 2022-05-09 DIAGNOSIS — I4891 Unspecified atrial fibrillation: Secondary | ICD-10-CM | POA: Insufficient documentation

## 2022-05-09 DIAGNOSIS — Z7901 Long term (current) use of anticoagulants: Secondary | ICD-10-CM | POA: Insufficient documentation

## 2022-05-09 DIAGNOSIS — D689 Coagulation defect, unspecified: Secondary | ICD-10-CM | POA: Insufficient documentation

## 2022-05-09 DIAGNOSIS — N189 Chronic kidney disease, unspecified: Secondary | ICD-10-CM | POA: Insufficient documentation

## 2022-05-09 DIAGNOSIS — I5020 Unspecified systolic (congestive) heart failure: Secondary | ICD-10-CM | POA: Insufficient documentation

## 2022-05-09 DIAGNOSIS — F17210 Nicotine dependence, cigarettes, uncomplicated: Secondary | ICD-10-CM | POA: Insufficient documentation

## 2022-05-09 DIAGNOSIS — D496 Neoplasm of unspecified behavior of brain: Secondary | ICD-10-CM | POA: Insufficient documentation

## 2022-05-09 DIAGNOSIS — I428 Other cardiomyopathies: Secondary | ICD-10-CM | POA: Diagnosis not present

## 2022-05-09 DIAGNOSIS — M79605 Pain in left leg: Secondary | ICD-10-CM | POA: Insufficient documentation

## 2022-05-09 DIAGNOSIS — Z79899 Other long term (current) drug therapy: Secondary | ICD-10-CM | POA: Insufficient documentation

## 2022-05-09 DIAGNOSIS — R202 Paresthesia of skin: Secondary | ICD-10-CM | POA: Diagnosis not present

## 2022-05-09 DIAGNOSIS — C711 Malignant neoplasm of frontal lobe: Secondary | ICD-10-CM | POA: Insufficient documentation

## 2022-05-09 DIAGNOSIS — C712 Malignant neoplasm of temporal lobe: Secondary | ICD-10-CM

## 2022-05-09 MED ORDER — SODIUM CHLORIDE 0.9% FLUSH
10.0000 mL | Freq: Once | INTRAVENOUS | Status: AC
  Administered 2022-05-09: 10 mL via INTRAVENOUS

## 2022-05-09 NOTE — Progress Notes (Signed)
Has armband been applied?  Yes.    Does patient have an allergy to IV contrast dye?: No.   Has patient ever received premedication for IV contrast dye?: No.   Date of lab work: April 27, 2022 BUN: 23 CR: 1.26 eGFR: >60  Does patient take metformin?: No.  Is eGFR >60?: Yes.   If no, when can patient resume? (Must be 48 hrs AFTER they receive IV contrast):  N/A  IV site: forearm left, condition patent and no redness  Has IV site been added to flowsheet?  Yes.    BP 120/76 (BP Location: Left Arm, Patient Position: Sitting)   Pulse 75   Temp (!) 97.2 F (36.2 C) (Temporal)   Resp 18   Ht 6' (1.829 m)   Wt 199 lb (90.3 kg)   SpO2 100%   BMI 26.99 kg/m

## 2022-05-10 ENCOUNTER — Other Ambulatory Visit (HOSPITAL_COMMUNITY): Payer: Self-pay

## 2022-05-10 ENCOUNTER — Ambulatory Visit
Admission: RE | Admit: 2022-05-10 | Discharge: 2022-05-10 | Disposition: A | Discharge status: Home / Self Care | Payer: Commercial Managed Care - HMO | Source: Ambulatory Visit | Attending: Radiation Oncology | Admitting: Radiation Oncology

## 2022-05-10 ENCOUNTER — Encounter: Payer: Self-pay | Admitting: Radiation Oncology

## 2022-05-10 DIAGNOSIS — C712 Malignant neoplasm of temporal lobe: Secondary | ICD-10-CM | POA: Insufficient documentation

## 2022-05-10 DIAGNOSIS — C719 Malignant neoplasm of brain, unspecified: Secondary | ICD-10-CM

## 2022-05-10 MED ORDER — GADOPICLENOL 0.5 MMOL/ML IV SOLN
10.0000 mL | Freq: Once | INTRAVENOUS | Status: AC | PRN
  Administered 2022-05-10: 10 mL via INTRAVENOUS

## 2022-05-11 ENCOUNTER — Inpatient Hospital Stay: Payer: Commercial Managed Care - HMO

## 2022-05-11 ENCOUNTER — Inpatient Hospital Stay: Payer: Commercial Managed Care - HMO | Admitting: Internal Medicine

## 2022-05-11 ENCOUNTER — Encounter: Payer: Self-pay | Admitting: Internal Medicine

## 2022-05-11 ENCOUNTER — Inpatient Hospital Stay: Payer: Commercial Managed Care - HMO | Attending: Neurosurgery

## 2022-05-11 VITALS — BP 115/82 | HR 72 | Temp 97.9°F | Resp 17 | Wt 199.2 lb

## 2022-05-11 VITALS — BP 106/75 | HR 52 | Resp 17

## 2022-05-11 DIAGNOSIS — Z8249 Family history of ischemic heart disease and other diseases of the circulatory system: Secondary | ICD-10-CM | POA: Diagnosis not present

## 2022-05-11 DIAGNOSIS — C711 Malignant neoplasm of frontal lobe: Secondary | ICD-10-CM

## 2022-05-11 DIAGNOSIS — Z9049 Acquired absence of other specified parts of digestive tract: Secondary | ICD-10-CM | POA: Diagnosis not present

## 2022-05-11 DIAGNOSIS — Z833 Family history of diabetes mellitus: Secondary | ICD-10-CM | POA: Insufficient documentation

## 2022-05-11 DIAGNOSIS — Z5112 Encounter for antineoplastic immunotherapy: Secondary | ICD-10-CM | POA: Diagnosis not present

## 2022-05-11 DIAGNOSIS — M25519 Pain in unspecified shoulder: Secondary | ICD-10-CM | POA: Insufficient documentation

## 2022-05-11 DIAGNOSIS — Z7901 Long term (current) use of anticoagulants: Secondary | ICD-10-CM | POA: Diagnosis not present

## 2022-05-11 DIAGNOSIS — R531 Weakness: Secondary | ICD-10-CM | POA: Insufficient documentation

## 2022-05-11 DIAGNOSIS — Z885 Allergy status to narcotic agent status: Secondary | ICD-10-CM | POA: Insufficient documentation

## 2022-05-11 DIAGNOSIS — C719 Malignant neoplasm of brain, unspecified: Secondary | ICD-10-CM

## 2022-05-11 DIAGNOSIS — F17210 Nicotine dependence, cigarettes, uncomplicated: Secondary | ICD-10-CM | POA: Diagnosis not present

## 2022-05-11 DIAGNOSIS — Z79899 Other long term (current) drug therapy: Secondary | ICD-10-CM | POA: Insufficient documentation

## 2022-05-11 DIAGNOSIS — Z79630 Long term (current) use of alkylating agent: Secondary | ICD-10-CM | POA: Insufficient documentation

## 2022-05-11 LAB — CBC WITH DIFFERENTIAL (CANCER CENTER ONLY)
Abs Immature Granulocytes: 0.05 10*3/uL (ref 0.00–0.07)
Basophils Absolute: 0.1 10*3/uL (ref 0.0–0.1)
Basophils Relative: 1 %
Eosinophils Absolute: 0.1 10*3/uL (ref 0.0–0.5)
Eosinophils Relative: 1 %
HCT: 46 % (ref 39.0–52.0)
Hemoglobin: 16 g/dL (ref 13.0–17.0)
Immature Granulocytes: 1 %
Lymphocytes Relative: 11 %
Lymphs Abs: 1 10*3/uL (ref 0.7–4.0)
MCH: 36.3 pg — ABNORMAL HIGH (ref 26.0–34.0)
MCHC: 34.8 g/dL (ref 30.0–36.0)
MCV: 104.3 fL — ABNORMAL HIGH (ref 80.0–100.0)
Monocytes Absolute: 0.7 10*3/uL (ref 0.1–1.0)
Monocytes Relative: 8 %
Neutro Abs: 7.1 10*3/uL (ref 1.7–7.7)
Neutrophils Relative %: 78 %
Platelet Count: 220 10*3/uL (ref 150–400)
RBC: 4.41 MIL/uL (ref 4.22–5.81)
RDW: 14.6 % (ref 11.5–15.5)
WBC Count: 9 10*3/uL (ref 4.0–10.5)
nRBC: 0 % (ref 0.0–0.2)

## 2022-05-11 LAB — CMP (CANCER CENTER ONLY)
ALT: 13 U/L (ref 0–44)
AST: 15 U/L (ref 15–41)
Albumin: 4.6 g/dL (ref 3.5–5.0)
Alkaline Phosphatase: 46 U/L (ref 38–126)
Anion gap: 9 (ref 5–15)
BUN: 19 mg/dL (ref 6–20)
CO2: 26 mmol/L (ref 22–32)
Calcium: 10.2 mg/dL (ref 8.9–10.3)
Chloride: 106 mmol/L (ref 98–111)
Creatinine: 1.46 mg/dL — ABNORMAL HIGH (ref 0.61–1.24)
GFR, Estimated: 57 mL/min — ABNORMAL LOW (ref >60)
Glucose, Bld: 96 mg/dL (ref 70–99)
Potassium: 4.5 mmol/L (ref 3.5–5.1)
Sodium: 141 mmol/L (ref 135–145)
Total Bilirubin: 0.9 mg/dL (ref 0.3–1.2)
Total Protein: 7.4 g/dL (ref 6.5–8.1)

## 2022-05-11 LAB — TOTAL PROTEIN, URINE DIPSTICK: Protein, ur: NEGATIVE mg/dL

## 2022-05-11 MED ORDER — PREDNISONE 20 MG PO TABS: 20.0000 mg | ORAL_TABLET | Freq: Every day | ORAL | 0 refills | Status: DC

## 2022-05-11 MED ORDER — SODIUM CHLORIDE 0.9 % IV SOLN: Freq: Once | INTRAVENOUS | Status: AC

## 2022-05-11 MED ORDER — SODIUM CHLORIDE 0.9 % IV SOLN
5.0000 mg/kg | Freq: Once | INTRAVENOUS | Status: AC
  Administered 2022-05-11: 500 mg via INTRAVENOUS
  Filled 2022-05-11: qty 4

## 2022-05-11 NOTE — Progress Notes (Signed)
[facility] [place], [place] [address] [phone]   Interval Evaluation  Date of Service: 05/11/22 Patient Name: Jerry Richards Patient MRN: [id number] Patient DOB: March 13, 1970 Provider: Ventura Sellers, MD  Identifying Statement:  Jerry Richards is a 52 y.o. male with right frontal glioblastoma   Oncologic History: Oncology History  High grade glioma not classifiable by WHO criteria ([place])  05/16/2021 Surgery   Craniotomy, R parafalcine resection with Dr. Annette Stable; path is high grade glioma IDHwt   06/20/2021 - 06/20/2021 Chemotherapy   Patient is on Treatment Plan : BRAIN GLIOBLASTOMA Radiation Therapy With Concurrent Temozolomide 75 mg/m2 Daily Followed By Sequential Maintenance Temozolomide x 6-12 cycles     09/05/2021 - 12/17/2021 Chemotherapy   Completes 3 cycles of adjuvant 5-day Temodar   12/18/2021 Progression   Progression of disease #1   12/29/2021 -  Chemotherapy   Initiates second line therapy with CCNU '90mg'$ /m2 q6 weeks, and Avastin '10mg'$ /kg IV q2 weeks   Glioblastoma multiforme of frontal lobe ([place])  09/12/2021 Initial Diagnosis   Glioblastoma multiforme of frontal lobe ([place])   12/29/2021 -  Chemotherapy   Patient is on Treatment Plan : BRAIN GBM Bevacizumab q14d       Biomarkers:  MGMT Unknown.  IDH 1/2 Wild type.  EGFR Unknown  TERT Unknown   Interval History: Jerry Richards presents today for avastin infusion.  Shoulder pain has recurred since avastin was restarted and steroids stopped.  It is not as severe as prior, now worse on the right than the left.  No longer needing tramadol.  Denies any recurrence of seizures, no headaches.  Leg symptoms are stable.  Has radiation planned with Dr. Isidore Moos starting next week.   H+P (06/07/21): Patient presented to neurologic attention in March with several days of progressive left sided weakness and seizures, characterized by twitching of the left leg.  CNS  imaging demonstrated a right frontal mass.  He underwent craniotomy and resection with Dr. Annette Stable on 05/16/21; path demonstrated high grade glioma IDHwt.  Following surgery his left leg was much weaker, though it has improved with aggressive rehab efforts.  Currently he is using a cane to ambulate.  He has no other complaints, continues on decadron '2mg'$  twice per day.  No seizures since surgery.  Medications: Current Outpatient Medications on File Prior to Visit  Medication Sig Dispense Refill   acetaminophen (TYLENOL) 500 MG tablet Take 1,000 mg by mouth every 6 (six) hours as needed for moderate pain.     albuterol (VENTOLIN HFA) 108 (90 Base) MCG/ACT inhaler Inhale 1 puff into the lungs every 4 (four) hours as needed.     levETIRAcetam (KEPPRA) 500 MG tablet TAKE 2 TABLETS(1000 MG) BY MOUTH TWICE DAILY 360 tablet 0   losartan (COZAAR) 50 MG tablet Take 50 mg by mouth daily.  6   mirtazapine (REMERON) 15 MG tablet Take 15 mg by mouth at bedtime.     ondansetron (ZOFRAN) 8 MG tablet Take 1 tablet (8 mg total) by mouth every 8 (eight) hours as needed for nausea or vomiting. 30 tablet 1   rivaroxaban (XARELTO) 20 MG TABS tablet Take 1 tablet (20 mg total) by mouth daily. 90 tablet 1   traMADol (ULTRAM) 50 MG tablet Take 1 tablet (50 mg total) by mouth every 6 (six) hours as needed for severe pain. 30 tablet 0   No current facility-administered medications on file prior to visit.  Allergies:  Allergies  Allergen Reactions   Amiodarone     AMIODARONE ANALOGUES Hyperthyroidism   Morphine And Related Itching and Palpitations    Chest pressure/palpitations   Past Medical History:  Past Medical History:  Diagnosis Date   Anxiety    Atrial fibrillation ([place])    a. s/p RFCA x 2 (10/26/2011, 04/15/2012)   Atrial flutter ([facility])    Chronic kidney disease    Clotting disorder ([place])    Colitis    Current smoker    Diverticulitis    Finger near amputation, left    Hyperthyroidism    Secondary to  amiodarone   LA thrombus    Non-ischemic cardiomyopathy ([place])    OSA on CPAP    Sinus trouble    Sleep apnea    Systolic heart failure ([facility])    EF 20-25%-> 50-55% in 10/2011. MOST RECENT ECHO (03/2012) EF= 66%   Past Surgical History:  Past Surgical History:  Procedure Laterality Date   APPLICATION OF CRANIAL NAVIGATION Right 05/16/2021   Procedure: APPLICATION OF CRANIAL NAVIGATION;  Surgeon: Earnie Larsson, MD;  Location: [facility];  Service: Neurosurgery;  Laterality: Right;   ATRIAL ABLATION SURGERY     CARDIAC CATHETERIZATION  08/2011   [facility]   CHOLECYSTECTOMY     CRANIOTOMY Right 05/16/2021   Procedure: Craniotomy - right - Parietal with  brainlab;  Surgeon: Earnie Larsson, MD;  Location: [facility];  Service: Neurosurgery;  Laterality: Right;   FINGER SURGERY     LEFT HEART CATHETERIZATION WITH CORONARY ANGIOGRAM N/A 09/12/2011   Procedure: LEFT HEART CATHETERIZATION WITH CORONARY ANGIOGRAM;  Surgeon: Hillary Bow, MD;  Location: [facility] CATH LAB;  Service: Cardiovascular;  Laterality: N/A;   VASECTOMY     Social History:  Social History   Socioeconomic History   Marital status: Married    Spouse name: tanya   Number of children: 2   Years of education: Not on file   Highest education level: Some college, no degree  Occupational History   Occupation: SELF EMPLOYED    Comment: Nurse, 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 Occas tea   Social Determinants of Health   Financial Resource Strain: Not on  file  Food Insecurity: Not on file  Transportation Needs: Not on file  Physical Activity: Not on file  Stress: Not on file  Social Connections: Not on file  Intimate Partner Violence: Not on file   Family History:  Family History  Problem Relation Age of Onset   Healthy Mother        age 44   Melanoma Mother    Melanoma Father    Atrial fibrillation Father        age 29   Diabetes Maternal Grandmother    Coronary artery disease Paternal Grandmother    Heart disease Paternal Grandmother  Heart attack Paternal Grandmother    Coronary artery disease Paternal Grandfather    Heart disease Paternal Grandfather    Heart attack Paternal Grandfather    Atrial fibrillation Paternal Aunt    Atrial fibrillation Paternal Uncle    Colon cancer Neg Hx    Esophageal cancer Neg Hx    Ovarian cancer Neg Hx    Stomach cancer Neg Hx    Rectal cancer Neg Hx     Review of Systems: Constitutional: Doesn't report fevers, chills or abnormal weight loss Eyes: Doesn't report blurriness of vision Ears, nose, mouth, throat, and face: Doesn't report sore throat Respiratory: Doesn't report cough, dyspnea or wheezes Cardiovascular: Doesn't report palpitation, chest discomfort  Gastrointestinal:  Doesn't report nausea, constipation, diarrhea GU: Doesn't report incontinence Skin: Doesn't report skin rashes Neurological: Per HPI Musculoskeletal: Doesn't report joint pain Behavioral/Psych: Doesn't report anxiety  Physical Exam: Vitals:   05/11/22 1157  BP: 115/82  Pulse: 72  Resp: 17  Temp: 97.9 F (36.6 C)  SpO2: 100%   KPS: 80. General: Alert, cooperative, pleasant, in no acute distress Head: Normal EENT: No conjunctival injection or scleral icterus.  Lungs: Resp effort normal Cardiac: Regular rate Abdomen: Non-distended abdomen Skin: No rashes cyanosis or petechiae. Extremities: No clubbing or edema  Neurologic Exam: Mental Status: Awake, alert, attentive to examiner. Oriented to  self and environment. Language is fluent with intact comprehension.  Cranial Nerves: Visual acuity is grossly normal. Visual fields are full. Extra-ocular movements intact. No ptosis. Face is symmetric Motor: Tone and bulk are normal. Power is 4/5 in left leg, subtle drift in left arm. Reflexes are symmetric, no pathologic reflexes present.  Sensory: Intact to light touch Gait: Hemiparetic, cane assisted.   Labs: I have reviewed the data as listed    Component Value Date/Time   NA 141 05/11/2022 1113   NA 140 03/29/2017 1045   K 4.5 05/11/2022 1113   CL 106 05/11/2022 1113   CO2 26 05/11/2022 1113   GLUCOSE 96 05/11/2022 1113   BUN 19 05/11/2022 1113   BUN 18 03/29/2017 1045   CREATININE 1.46 (H) 05/11/2022 1113   CALCIUM 10.2 05/11/2022 1113   PROT 7.4 05/11/2022 1113   PROT 7.7 03/29/2017 1045   ALBUMIN 4.6 05/11/2022 1113   ALBUMIN 5.2 03/29/2017 1045   AST 15 05/11/2022 1113   ALT 13 05/11/2022 1113   ALKPHOS 46 05/11/2022 1113   BILITOT 0.9 05/11/2022 1113   GFRNONAA 57 (L) 05/11/2022 1113   GFRAA 56 (L) 03/29/2017 1045   Lab Results  Component Value Date   WBC 6.7 04/27/2022   NEUTROABS 4.1 04/27/2022   HGB 14.1 04/27/2022   HCT 39.0 04/27/2022   MCV 102.6 (H) 04/27/2022   PLT 216 04/27/2022   Imaging:  [place] Clinician Interpretation: I have personally reviewed the CNS images as listed.  My interpretation, in the context of the patient's clinical presentation, is stable disease  MR Brain W Wo Contrast  Result Date: 05/10/2022 CLINICAL DATA:  Brain/CNS neoplasm, monitor 3T SRS Protocol for salvage radiation treatment planning EXAM: MRI HEAD WITHOUT AND WITH CONTRAST TECHNIQUE: Multiplanar, multiecho pulse sequences of the brain and surrounding structures were obtained without and with intravenous contrast. CONTRAST:  10 mL Vueway COMPARISON:  MRI head April 20, 2022. FINDINGS: Brain: Mildly increased conspicuity of enhancement at the right frontal vertex lesion  and at the right peritrigonal lesion with similar FLAIR hyperintensity. Unchanged nonenhancing expansile FLAIR hyperintensity in the anterior right temporal lobe. Mild regional  mass effect without midline shift. Benign DVA in the left posterior frontal lobe. No hydrocephalus. Vascular: Major arterial flow voids are maintained skull base. Skull and upper cervical spine: Prior cranioplasty. Otherwise, normal signal. Sinuses/Orbits: Mild paranasal sinus mucosal thickening. Other: No mastoid effusions. IMPRESSION: 1. Mildly increased conspicuity of enhancement at the right frontal vertex lesion and at the right peritrigonal lesion with similar FLAIR hyperintensity. Unclear if this represents mild progression (particularly given short interval of follow-up) or differences in technique. Recommend continued follow-up. 2. Unchanged nonenhancing expansile FLAIR hyperintensity in the anterior right temporal lobe. Electronically Signed   By: Margaretha Sheffield M.D.   On: 05/10/2022 16:18   MR BRAIN W WO CONTRAST  Result Date: 04/23/2022 CLINICAL DATA:  Provided history: High-grade glioma not classifiable by WHO criteria. Brain/CNS neoplasm, assess treatment response. EXAM: MRI HEAD WITHOUT AND WITH CONTRAST TECHNIQUE: Multiplanar, multiecho pulse sequences of the brain and surrounding structures were obtained without and with intravenous contrast. CONTRAST:  17m GADAVIST GADOBUTROL 1 MMOL/ML IV SOLN COMPARISON:  Prior brain MRI examinations 02/02/2022 and earlier. FINDINGS: Brain: Redemonstrated postsurgical changes from prior mass resection within the posteromedial right frontal lobe and adjacent right parietal lobe (underlying a parietal cranioplasty). Non-acute pre-contrast T1 hyperintense blood products along the periphery of the resection cavity have increased. A curvilinear focus of enhancement along the posteromedial aspect of the resection cavity has remained stable. The previously described punctate focus of  enhancement along the anterior aspect of the resection cavity is no longer appreciated. Restricted diffusion about the resection cavity has slightly increased (most notable medially) since the prior brain MRI of 02/02/2022, and remains significantly increased in extent from the prior brain MRI of 12/16/2021. Additionally, surrounding T2 FLAIR hyperintense signal abnormality within the right cerebral hemisphere has mildly increased in extent from the most recent exam (most notable anteriorly). No increased mass effect is noted. Non-acute pre-contrast T1 hyperintense blood products at site of a lesion centered in the right periatrial white matter have increased. Irregular enhancement at this site is similar to the prior exam. Restricted diffusion at this site has slightly increased from the prior exam, and remains significantly increased from the prior MRI of 12/16/2021. T2 FLAIR hyperintense signal abnormality associated with this lesion is similar to slightly increased from the most recent prior study. No increased mass effect is noted. The expansile T2 FLAIR hyperintense lesion within the anterior aspect of the right temporal lobe (involving the cortex and subcortical white matter) has increased in size, now measuring 3.5 x 3.1 cm in transaxial dimensions (previously 2.8 x 2.7 cm). There is no restricted diffusion or enhancement at this site. Mild multifocal T2 FLAIR hyperintense signal abnormality elsewhere within the cerebral white matter, nonspecific but possibly reflecting mild changes of chronic small vessel ischemia. No extra-axial fluid collection. No midline shift. Vascular: Maintained flow voids within the proximal large arterial vessels. Left frontal lobe developmental venous anomaly (an anatomic variant). Skull and upper cervical spine: Biparietal cranioplasty. No focal suspicious marrow lesion. Sinuses/Orbits: No mass or acute finding within the imaged orbits. 10 mm mucous retention cyst within the  right maxillary sinus. Minimal mucosal thickening within bilateral ethmoid air cells. These results will be called to the ordering clinician or representative by the Radiologist Assistant, and communication documented in the PACS or [employer] IMPRESSION: 1. Restricted diffusion surrounding the right frontoparietal resection cavity has slightly increased in extent from the prior brain MRI of 02/02/2022, and remains significantly increased in extent from the prior brain MRI of 12/16/2021.  Additionally, surrounding T2 FLAIR hyperintense signal abnormality has slightly increased. Progressive non-acute hemorrhage at this site without progressive enhancement or new mass effect. 2. Restricted diffusion associated with the lesion centered within the right periatrial white matter has slightly increased from the prior brain MRI of 02/02/2022, and remains significantly increased in extent from the prior brain MRI of 12/16/2021. T2 FLAIR hyperintense signal abnormality at this site is similar to slightly increased. Progressive non-acute hemorrhage at this site without progressive enhancement or new mass effect. 3. The non-enhancing expansile T2 FLAIR hyperintense lesion within the anterior aspect of the right temporal lobe has increased in size from the prior exam, now measuring 3.5 x 3.1 cm in transaxial dimensions (previously 2.8 x 2.7 cm). This may reflect infiltrative tumor (possible glioma) or encephalitis. Electronically Signed   By: Kellie Simmering D.O.   On: 04/23/2022 13:29     Assessment/Plan High grade glioma not classifiable by WHO criteria ([place])  Glioblastoma multiforme of frontal lobe [facility]) - Plan: Clinic Appointment Request  Jerry Richards is clinically stable today, labs are within normal limits.  For recurrent arthalgias, potentially related to Avastin, we will dose reduce to '5mg'$ /kg IV q2 weeks.  Within anterior right frontal lobe, there is ongoing infiltration of what we suspect is  under-treated gliomatosis.  He will proceed with fractionated conventional radiation beginning next week with Dr. Isidore Moos.    Will proceed with cycle #4 CCNU '90mg'$ /m2 PO q6 weeks, in addition to avastin, now '5mg'$ /kg IV q2 weeks.  Avastin will be utilized because of high growth rate and tumor location near eloquent motor structures.  We reviewed side effects of CCNU, including nausea/vomiting, cytopenias.  Effects of avastin could include hypertension, bleedin/clotting, wound healing impairment.  We will check blood counts every 2 weeks concurrent with avastin pre-treat visits.     Avastin will be administered today as scheduled.  Chemotherapy should be held for the following:  ANC less than 1,000  Platelets less than 100,000  LFT or creatinine greater than 2x ULN  If clinical concerns/contraindications develop  Avastin should be held for the following:  ANC less than 500  Platelets less than 50,000  LFT or creatinine greater than 2x ULN  If clinical concerns/contraindications develop  Recommended an additional 7 days of prednisone '50mg'$  daily.   Keppra will remain at '1000mg'$  BID.  May con't Tramadol '100mg'$  q8 PRN for breakthrough pain.  We ask that [name] return to clinic in 2 weeks for avastin, or sooner as needed.    All questions were answered. The patient knows to call the clinic with any problems, questions or concerns. No barriers to learning were detected.  The total time spent in the encounter was 40 minutes and more than 50% was on counseling and review of test results   Ventura Sellers, MD Medical Director of Neuro-Oncology [facility] at [place] 05/11/22 12:08 PM

## 2022-05-12 ENCOUNTER — Telehealth: Payer: Self-pay | Admitting: Internal Medicine

## 2022-05-12 NOTE — Telephone Encounter (Signed)
Per 3/14 LOS reached out to patient to schedule

## 2022-05-16 DIAGNOSIS — C711 Malignant neoplasm of frontal lobe: Secondary | ICD-10-CM | POA: Diagnosis not present

## 2022-05-17 ENCOUNTER — Ambulatory Visit
Admission: RE | Admit: 2022-05-17 | Discharge: 2022-05-17 | Disposition: A | Discharge status: Home / Self Care | Payer: Commercial Managed Care - HMO | Source: Ambulatory Visit | Attending: Radiation Oncology | Admitting: Radiation Oncology

## 2022-05-17 ENCOUNTER — Other Ambulatory Visit: Payer: Self-pay

## 2022-05-17 DIAGNOSIS — C711 Malignant neoplasm of frontal lobe: Secondary | ICD-10-CM | POA: Diagnosis not present

## 2022-05-17 LAB — RAD ONC ARIA SESSION SUMMARY
Course Elapsed Days: 0
Plan Fractions Treated to Date: 1
Plan Prescribed Dose Per Fraction: 6 Gy
Plan Total Fractions Prescribed: 5
Plan Total Prescribed Dose: 30 Gy
Reference Point Dosage Given to Date: 6 Gy
Reference Point Session Dosage Given: 6 Gy
Session Number: 1

## 2022-05-17 NOTE — Op Note (Signed)
  Name: Jerry Richards  MRN: [id number]  Date: 05/17/2022   DOB: [birth year]  Stereotactic Radiosurgery Operative Note  PRE-OPERATIVE DIAGNOSIS:  Brain tumor  POST-OPERATIVE DIAGNOSIS:   brain tumor  PROCEDURE:  Stereotactic Radiosurgery  SURGEON:  Charlie Pitter, MD  NARRATIVE: The patient underwent a radiation treatment planning session in the radiation oncology simulation suite under the care of the radiation oncology physician and physicist.  I participated closely in the radiation treatment planning afterwards. The patient underwent planning CT which was fused to 3T high resolution MRI with 1 mm axial slices.  These images were fused on the planning system.  We contoured the gross target volumes and subsequently expanded this to yield the Planning Target Volume. I actively participated in the planning process.  I helped to define and review the target contours and also the contours of the optic pathway, eyes, brainstem and selected nearby organs at risk.  All the dose constraints for critical structures were reviewed and compared to AAPM Task Group 101.  The prescription dose conformity was reviewed.  I approved the plan electronically.    Accordingly, Jerry Richards was brought to the TrueBeam stereotactic radiation treatment linac and placed in the custom immobilization mask.  The patient was aligned according to the IR fiducial markers with BrainLab Exactrac, then orthogonal x-rays were used in ExacTrac with the 6DOF robotic table and the shifts were made to align the patient  Jerry Richards received stereotactic radiosurgery uneventfully.    The detailed description of the procedure is recorded in the radiation oncology procedure note.  I was present for the duration of the procedure.  DISPOSITION:  Following delivery, the patient was transported to nursing in stable condition and monitored for possible acute effects to be discharged to home in stable condition with follow-up in one  month.  Charlie Pitter, MD 05/17/2022 12:34 PM

## 2022-05-17 NOTE — Progress Notes (Signed)
Nurse monitoring complete status post 1 of 5 SRS treatments. Patient without complaints. Patient denies new or worsening neurologic symptoms. Vitals stable. Instructed patient to avoid strenuous activity for the next 24 hours. Instructed patient to call [phone] with needs related to treatment after hours or over the weekend. Patient and wife verbalized understanding. Patient ambulated out of clinic unassisted with spouse without incident   Vitals:   05/17/22 1230  BP: 121/89  Pulse: (!) 49  Resp: 18  Temp: (!) 97.2 F (36.2 C)  SpO2: 100%

## 2022-05-19 ENCOUNTER — Other Ambulatory Visit: Payer: Self-pay

## 2022-05-19 ENCOUNTER — Ambulatory Visit
Admission: RE | Admit: 2022-05-19 | Discharge: 2022-05-19 | Disposition: A | Discharge status: Home / Self Care | Payer: Commercial Managed Care - HMO | Source: Ambulatory Visit | Attending: Radiation Oncology | Admitting: Radiation Oncology

## 2022-05-19 DIAGNOSIS — C711 Malignant neoplasm of frontal lobe: Secondary | ICD-10-CM | POA: Diagnosis not present

## 2022-05-19 LAB — RAD ONC ARIA SESSION SUMMARY
Course Elapsed Days: 2
Plan Fractions Treated to Date: 2
Plan Prescribed Dose Per Fraction: 6 Gy
Plan Total Fractions Prescribed: 5
Plan Total Prescribed Dose: 30 Gy
Reference Point Dosage Given to Date: 12 Gy
Reference Point Session Dosage Given: 6 Gy
Session Number: 2

## 2022-05-19 NOTE — Progress Notes (Signed)
Nurse monitoring complete status post 2 of 5 SRS treatments. Patient without complaints. Patient denies new or worsening neurologic symptoms. Vitals stable. Instructed patient to avoid strenuous activity for the next 24 hours. Instructed patient to call [phone] with needs related to treatment after hours or over the weekend. Patient and wife verbalized understanding. Patient ambulated out of clinic unassisted without incident.  Vitals:   05/19/22 1220  BP: (!) 135/95  Pulse: (!) 50  Resp: 18  Temp: (!) 97 F (36.1 C)  SpO2: 100%

## 2022-05-22 ENCOUNTER — Other Ambulatory Visit: Payer: Self-pay

## 2022-05-22 ENCOUNTER — Ambulatory Visit
Admission: RE | Admit: 2022-05-22 | Discharge: 2022-05-22 | Disposition: A | Discharge status: Home / Self Care | Payer: Commercial Managed Care - HMO | Source: Ambulatory Visit | Attending: Radiation Oncology | Admitting: Radiation Oncology

## 2022-05-22 DIAGNOSIS — C711 Malignant neoplasm of frontal lobe: Secondary | ICD-10-CM | POA: Diagnosis not present

## 2022-05-22 LAB — RAD ONC ARIA SESSION SUMMARY
Course Elapsed Days: 5
Plan Fractions Treated to Date: 3
Plan Prescribed Dose Per Fraction: 6 Gy
Plan Total Fractions Prescribed: 5
Plan Total Prescribed Dose: 30 Gy
Reference Point Dosage Given to Date: 18 Gy
Reference Point Session Dosage Given: 6 Gy
Session Number: 3

## 2022-05-22 NOTE — Progress Notes (Signed)
Nurse monitoring complete status post 3 of 5 SRS treatments. Patient without complaints. Patient denies new or worsening neurologic symptoms. Vitals stable. Instructed patient to avoid strenuous activity for the next 24 hours. Instructed patient to call [phone] with needs related to treatment after hours or over the weekend. Patient and wife verbalized understanding. Patient ambulated out of clinic unassisted without incident.  Vitals:   05/22/22 0930  BP: 114/74  Pulse: (!) 55  Resp: 20  Temp: 97.7 F (36.5 C)  SpO2: 100%    Max Romano M. Leonie Green, BSN

## 2022-05-24 ENCOUNTER — Other Ambulatory Visit: Payer: Self-pay

## 2022-05-24 ENCOUNTER — Ambulatory Visit
Admission: RE | Admit: 2022-05-24 | Discharge: 2022-05-24 | Disposition: A | Discharge status: Home / Self Care | Payer: Commercial Managed Care - HMO | Source: Ambulatory Visit | Attending: Radiation Oncology | Admitting: Radiation Oncology

## 2022-05-24 DIAGNOSIS — C711 Malignant neoplasm of frontal lobe: Secondary | ICD-10-CM | POA: Diagnosis not present

## 2022-05-24 LAB — RAD ONC ARIA SESSION SUMMARY
Course Elapsed Days: 7
Plan Fractions Treated to Date: 4
Plan Prescribed Dose Per Fraction: 6 Gy
Plan Total Fractions Prescribed: 5
Plan Total Prescribed Dose: 30 Gy
Reference Point Dosage Given to Date: 24 Gy
Reference Point Session Dosage Given: 6 Gy
Session Number: 4

## 2022-05-24 NOTE — Progress Notes (Signed)
Nurse monitoring complete status post 4 of 5 SRS treatments. Patient without complaints. Patient denies new or worsening neurologic symptoms. Vitals stable. Instructed patient to avoid strenuous activity for the next 24 hours. Instructed patient to call [phone] with needs related to treatment after hours or over the weekend. Patient and wife verbalized understanding. Patient ambulated out of clinic unassisted without incident.  Vitals:   05/24/22 0921  BP: 114/82  Pulse: 87  Resp: 20  Temp: 97.9 F (36.6 C)  SpO2: 100%    Amye Grego M. Leonie Green, BSN

## 2022-05-25 ENCOUNTER — Ambulatory Visit: Payer: Commercial Managed Care - HMO | Admitting: Internal Medicine

## 2022-05-25 ENCOUNTER — Ambulatory Visit: Payer: Commercial Managed Care - HMO

## 2022-05-25 ENCOUNTER — Other Ambulatory Visit: Payer: Commercial Managed Care - HMO

## 2022-05-26 ENCOUNTER — Other Ambulatory Visit: Payer: Self-pay

## 2022-05-26 ENCOUNTER — Ambulatory Visit
Admission: RE | Admit: 2022-05-26 | Discharge: 2022-05-26 | Disposition: A | Discharge status: Home / Self Care | Payer: Commercial Managed Care - HMO | Source: Ambulatory Visit | Attending: Radiation Oncology | Admitting: Radiation Oncology

## 2022-05-26 DIAGNOSIS — C711 Malignant neoplasm of frontal lobe: Secondary | ICD-10-CM | POA: Diagnosis not present

## 2022-05-26 LAB — RAD ONC ARIA SESSION SUMMARY
Course Elapsed Days: 9
Plan Fractions Treated to Date: 5
Plan Prescribed Dose Per Fraction: 6 Gy
Plan Total Fractions Prescribed: 5
Plan Total Prescribed Dose: 30 Gy
Reference Point Dosage Given to Date: 30 Gy
Reference Point Session Dosage Given: 6 Gy
Session Number: 5

## 2022-05-26 NOTE — Progress Notes (Signed)
Nurse monitoring complete status post 5 of 5 SRS treatments. Patient without complaints. Patient denies new or worsening neurologic symptoms. Vitals stable. Instructed patient to avoid strenuous activity for the next 24 hours. Instructed patient to call [phone] with needs related to treatment after hours or over the weekend. Patient and wife verbalized understanding. Patient and wife aware of 1 month F/U appointment with Dr. Isidore Moos. Patient ambulated out of clinic unassisted without incident  Vitals:   05/26/22 0940  BP: 121/78  Pulse: (!) 54  Resp: 18  Temp: (!) 97 F (36.1 C)  SpO2: 100%

## 2022-05-29 ENCOUNTER — Inpatient Hospital Stay: Payer: Commercial Managed Care - HMO

## 2022-05-29 ENCOUNTER — Inpatient Hospital Stay: Payer: Commercial Managed Care - HMO | Attending: Neurosurgery

## 2022-05-29 ENCOUNTER — Inpatient Hospital Stay: Payer: Commercial Managed Care - HMO | Admitting: Internal Medicine

## 2022-05-29 ENCOUNTER — Other Ambulatory Visit: Payer: Self-pay

## 2022-05-29 ENCOUNTER — Ambulatory Visit: Payer: Commercial Managed Care - HMO | Admitting: Radiation Oncology

## 2022-05-29 VITALS — BP 112/63 | HR 52 | Temp 97.3°F | Resp 18 | Wt 199.4 lb

## 2022-05-29 DIAGNOSIS — Z7901 Long term (current) use of anticoagulants: Secondary | ICD-10-CM | POA: Diagnosis not present

## 2022-05-29 DIAGNOSIS — Z5112 Encounter for antineoplastic immunotherapy: Secondary | ICD-10-CM | POA: Insufficient documentation

## 2022-05-29 DIAGNOSIS — Z79630 Long term (current) use of alkylating agent: Secondary | ICD-10-CM | POA: Insufficient documentation

## 2022-05-29 DIAGNOSIS — Z8249 Family history of ischemic heart disease and other diseases of the circulatory system: Secondary | ICD-10-CM | POA: Diagnosis not present

## 2022-05-29 DIAGNOSIS — Z885 Allergy status to narcotic agent status: Secondary | ICD-10-CM | POA: Insufficient documentation

## 2022-05-29 DIAGNOSIS — Z79620 Long term (current) use of immunosuppressive biologic: Secondary | ICD-10-CM | POA: Diagnosis not present

## 2022-05-29 DIAGNOSIS — R531 Weakness: Secondary | ICD-10-CM | POA: Diagnosis not present

## 2022-05-29 DIAGNOSIS — Z9049 Acquired absence of other specified parts of digestive tract: Secondary | ICD-10-CM | POA: Insufficient documentation

## 2022-05-29 DIAGNOSIS — M25519 Pain in unspecified shoulder: Secondary | ICD-10-CM | POA: Diagnosis not present

## 2022-05-29 DIAGNOSIS — R569 Unspecified convulsions: Secondary | ICD-10-CM

## 2022-05-29 DIAGNOSIS — G9389 Other specified disorders of brain: Secondary | ICD-10-CM | POA: Diagnosis not present

## 2022-05-29 DIAGNOSIS — Z7952 Long term (current) use of systemic steroids: Secondary | ICD-10-CM | POA: Diagnosis not present

## 2022-05-29 DIAGNOSIS — C719 Malignant neoplasm of brain, unspecified: Secondary | ICD-10-CM

## 2022-05-29 DIAGNOSIS — C711 Malignant neoplasm of frontal lobe: Secondary | ICD-10-CM | POA: Insufficient documentation

## 2022-05-29 DIAGNOSIS — Z808 Family history of malignant neoplasm of other organs or systems: Secondary | ICD-10-CM | POA: Insufficient documentation

## 2022-05-29 DIAGNOSIS — Z923 Personal history of irradiation: Secondary | ICD-10-CM | POA: Insufficient documentation

## 2022-05-29 DIAGNOSIS — F17210 Nicotine dependence, cigarettes, uncomplicated: Secondary | ICD-10-CM | POA: Diagnosis not present

## 2022-05-29 DIAGNOSIS — Z833 Family history of diabetes mellitus: Secondary | ICD-10-CM | POA: Diagnosis not present

## 2022-05-29 DIAGNOSIS — Z79899 Other long term (current) drug therapy: Secondary | ICD-10-CM | POA: Diagnosis not present

## 2022-05-29 LAB — CMP (CANCER CENTER ONLY)
ALT: 13 U/L (ref 0–44)
AST: 13 U/L — ABNORMAL LOW (ref 15–41)
Albumin: 4.4 g/dL (ref 3.5–5.0)
Alkaline Phosphatase: 46 U/L (ref 38–126)
Anion gap: 6 (ref 5–15)
BUN: 18 mg/dL (ref 6–20)
CO2: 26 mmol/L (ref 22–32)
Calcium: 9.6 mg/dL (ref 8.9–10.3)
Chloride: 109 mmol/L (ref 98–111)
Creatinine: 1.3 mg/dL — ABNORMAL HIGH (ref 0.61–1.24)
GFR, Estimated: >60 mL/min (ref >60)
Glucose, Bld: 82 mg/dL (ref 70–99)
Potassium: 4.1 mmol/L (ref 3.5–5.1)
Sodium: 141 mmol/L (ref 135–145)
Total Bilirubin: 0.6 mg/dL (ref 0.3–1.2)
Total Protein: 7 g/dL (ref 6.5–8.1)

## 2022-05-29 LAB — CBC WITH DIFFERENTIAL (CANCER CENTER ONLY)
Abs Immature Granulocytes: 0.02 10*3/uL (ref 0.00–0.07)
Basophils Absolute: 0 10*3/uL (ref 0.0–0.1)
Basophils Relative: 1 %
Eosinophils Absolute: 0.2 10*3/uL (ref 0.0–0.5)
Eosinophils Relative: 4 %
HCT: 45.7 % (ref 39.0–52.0)
Hemoglobin: 16 g/dL (ref 13.0–17.0)
Immature Granulocytes: 0 %
Lymphocytes Relative: 13 %
Lymphs Abs: 0.8 10*3/uL (ref 0.7–4.0)
MCH: 36.5 pg — ABNORMAL HIGH (ref 26.0–34.0)
MCHC: 35 g/dL (ref 30.0–36.0)
MCV: 104.3 fL — ABNORMAL HIGH (ref 80.0–100.0)
Monocytes Absolute: 0.6 10*3/uL (ref 0.1–1.0)
Monocytes Relative: 10 %
Neutro Abs: 4.8 10*3/uL (ref 1.7–7.7)
Neutrophils Relative %: 72 %
Platelet Count: 166 10*3/uL (ref 150–400)
RBC: 4.38 MIL/uL (ref 4.22–5.81)
RDW: 14.2 % (ref 11.5–15.5)
WBC Count: 6.6 10*3/uL (ref 4.0–10.5)
nRBC: 0 % (ref 0.0–0.2)

## 2022-05-29 LAB — TOTAL PROTEIN, URINE DIPSTICK: Protein, ur: NEGATIVE mg/dL

## 2022-05-29 MED ORDER — PREDNISONE 20 MG PO TABS: 40.0000 mg | ORAL_TABLET | Freq: Every day | ORAL | 1 refills | Status: DC

## 2022-05-29 NOTE — Radiation Completion Notes (Signed)
Patient Name: Jerry Richards, Jerry Richards MRN: [id number] Date of Birth: 25-Jul-1970 Referring Physician: Gar Ponto, M.D. Date of Service: 2022-05-29 Radiation Oncologist: Eppie Gibson, M.D. [address]                             RADIATION ONCOLOGY END OF TREATMENT NOTE     Diagnosis: C71.2 Malignant neoplasm of temporal lobe Intent: Palliative     ==========DELIVERED PLANS==========  First Treatment Date: 2022-05-17 - Last Treatment Date: 2022-05-26   Plan Name: Brain_SRT Site: Brain Technique: SBRT/SRT-IMRT Mode: Photon Dose Per Fraction: 6 Gy Prescribed Dose (Delivered / Prescribed): 30 Gy / 30 Gy Prescribed Fxs (Delivered / Prescribed): 5 / 5     ==========ON TREATMENT VISIT DATES========== 2022-05-17, 2022-05-19, 2022-05-22, 2022-05-24, 2022-05-26     ==========UPCOMING VISITS==========       ==========APPENDIX - ON TREATMENT VISIT NOTES==========   See weekly On Treatment Notes is Epic for details.

## 2022-05-29 NOTE — Progress Notes (Signed)
[facility] [place] [facility] [place], [place] [address] [phone]   Interval Evaluation  Date of Service: 05/29/22 Patient Name: Jerry Richards Patient MRN: [id number] Patient DOB: 02-15-1971 Provider: Ventura Sellers, MD  Identifying Statement:  Jerry Richards is a 52 y.o. male with right frontal glioblastoma   Oncologic History: Oncology History  High grade glioma not classifiable by [facility] criteria  05/16/2021 Surgery   Craniotomy, R parafalcine resection with Dr. Annette Stable; path is high grade glioma IDHwt   06/20/2021 - 06/20/2021 Chemotherapy   Patient is on Treatment Plan : BRAIN GLIOBLASTOMA Radiation Therapy With Concurrent Temozolomide 75 mg/m2 Daily Followed By Sequential Maintenance Temozolomide x 6-12 cycles     09/05/2021 - 12/17/2021 Chemotherapy   Completes 3 cycles of adjuvant 5-day Temodar   12/18/2021 Progression   Progression of disease #1   12/29/2021 -  Chemotherapy   Initiates second line therapy with CCNU 90mg /m2 q6 weeks, and Avastin 10mg /kg IV q2 weeks   Glioblastoma multiforme of frontal lobe  09/12/2021 Initial Diagnosis   Glioblastoma multiforme of frontal lobe ([place])   12/29/2021 -  Chemotherapy   Patient is on Treatment Plan : BRAIN GBM Bevacizumab q14d       Biomarkers:  MGMT Unknown.  IDH 1/2 Wild type.  EGFR Unknown  TERT Unknown   Interval History: Jerry Richards presents today for avastin infusion.  Shoulder pain has recurred since avastin was restarted and steroids stopped.  It is not as severe as prior, now worse on the right than the left.  No longer needing tramadol.  Denies any recurrence of seizures, no headaches.  Leg symptoms are stable.  Has radiation planned with Dr. Isidore Moos starting next week.   H+P (06/07/21): Patient presented to neurologic attention in March with several days of progressive left sided weakness and seizures, characterized by twitching of the left leg.  CNS imaging  demonstrated a right frontal mass.  He underwent craniotomy and resection with Dr. Annette Stable on 05/16/21; path demonstrated high grade glioma IDHwt.  Following surgery his left leg was much weaker, though it has improved with aggressive rehab efforts.  Currently he is using a cane to ambulate.  He has no other complaints, continues on decadron 2mg  twice per day.  No seizures since surgery.  Medications: Current Outpatient Medications on File Prior to Visit  Medication Sig Dispense Refill   acetaminophen (TYLENOL) 500 MG tablet Take 1,000 mg by mouth every 6 (six) hours as needed for moderate pain.     albuterol (VENTOLIN HFA) 108 (90 Base) MCG/ACT inhaler Inhale 1 puff into the lungs every 4 (four) hours as needed.     levETIRAcetam (KEPPRA) 500 MG tablet TAKE 2 TABLETS(1000 MG) BY MOUTH TWICE DAILY 360 tablet 0   losartan (COZAAR) 50 MG tablet Take 50 mg by mouth daily.  6   mirtazapine (REMERON) 15 MG tablet Take 15 mg by mouth at bedtime.     ondansetron (ZOFRAN) 8 MG tablet Take 1 tablet (8 mg total) by mouth every 8 (eight) hours as needed for nausea or vomiting. 30 tablet 1   rivaroxaban (XARELTO) 20 MG TABS tablet Take 1 tablet (20 mg total) by mouth daily. 90 tablet 1   No current facility-administered medications on file prior to visit.    Allergies:  Allergies  Allergen Reactions   Amiodarone     AMIODARONE ANALOGUES Hyperthyroidism   Morphine And Related Itching and Palpitations    Chest pressure/palpitations  Past Medical History:  Past Medical History:  Diagnosis Date   Anxiety    Atrial fibrillation ([place])    a. s/p RFCA x 2 (10/26/2011, 04/15/2012)   Atrial flutter ([facility])    Chronic kidney disease    Clotting disorder ([place])    Colitis    Current smoker    Diverticulitis    Finger near amputation, left    Hyperthyroidism    Secondary to amiodarone   LA thrombus    Non-ischemic cardiomyopathy ([place])    OSA on CPAP    Sinus trouble    Sleep apnea    Systolic heart  failure ([place])    EF 20-25%-> 50-55% in 10/2011. MOST RECENT ECHO (03/2012) EF= 66%   Past Surgical History:  Past Surgical History:  Procedure Laterality Date   APPLICATION OF CRANIAL NAVIGATION Right 05/16/2021   Procedure: APPLICATION OF CRANIAL NAVIGATION;  Surgeon: Earnie Larsson, MD;  Location: [facility];  Service: Neurosurgery;  Laterality: Right;   ATRIAL ABLATION SURGERY     CARDIAC CATHETERIZATION  08/2011   [facility]   CHOLECYSTECTOMY     CRANIOTOMY Right 05/16/2021   Procedure: Craniotomy - right - Parietal with  brainlab;  Surgeon: Earnie Larsson, MD;  Location: [facility];  Service: Neurosurgery;  Laterality: Right;   FINGER SURGERY     LEFT HEART CATHETERIZATION WITH CORONARY ANGIOGRAM N/A 09/12/2011   Procedure: LEFT HEART CATHETERIZATION WITH CORONARY ANGIOGRAM;  Surgeon: Hillary Bow, MD;  Location: [facility] C[facility] LAB;  Service: Cardiovascular;  Laterality: N/A;   VASECTOMY     Social History:  Social History   Socioeconomic History   Marital status: Married    Spouse name: tanya   Number of children: 2   Years of education: Not on file   Highest education level: Some college, no degree  Occupational History   Occupation: SELF EMPLOYED    Comment: Nurse, 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 Occas tea   Social Determinants of Health   Financial Resource Strain: Not on file  Food Insecurity: Not on file  Transportation Needs: Not on file  Physical Activity: Not on file  Stress: Not on file   Social Connections: Not on file  Intimate Partner Violence: Not on file   Family History:  Family History  Problem Relation Age of Onset   Healthy Mother        age 98   Melanoma Mother    Melanoma Father    Atrial fibrillation Father        age 34   Diabetes Maternal Grandmother    Coronary artery disease Paternal Grandmother    Heart disease Paternal Grandmother    Heart attack Paternal Grandmother    Coronary artery disease Paternal Grandfather    Heart disease Paternal Grandfather    Heart attack Paternal Grandfather    Atrial fibrillation  Paternal Aunt    Atrial fibrillation Paternal Uncle    Colon cancer Neg Hx    Esophageal cancer Neg Hx    Ovarian cancer Neg Hx    Stomach cancer Neg Hx    Rectal cancer Neg Hx     Review of Systems: Constitutional: Doesn't report fevers, chills or abnormal weight loss Eyes: Doesn't report blurriness of vision Ears, nose, mouth, throat, and face: Doesn't report sore throat Respiratory: Doesn't report cough, dyspnea or wheezes Cardiovascular: Doesn't report palpitation, chest discomfort  Gastrointestinal:  Doesn't report nausea, constipation, diarrhea GU: Doesn't report incontinence Skin: Doesn't report skin rashes Neurological: Per HPI Musculoskeletal: Doesn't report joint pain Behavioral/Psych: Doesn't report anxiety  Physical Exam: Vitals:   05/29/22 0936  BP: 112/63  Pulse: (!) 52  Resp: 18  Temp: (!) 97.3 F (36.3 C)  SpO2: 100%   KPS: 80. General: Alert, cooperative, pleasant, in no acute distress Head: Normal EENT: No conjunctival injection or scleral icterus.  Lungs: Resp effort normal Cardiac: Regular rate Abdomen: Non-distended abdomen Skin: No rashes cyanosis or petechiae. Extremities: No clubbing or edema  Neurologic Exam: Mental Status: Awake, alert, attentive to examiner. Oriented to self and environment. Language is fluent with intact comprehension.  Cranial Nerves: Visual acuity is grossly normal.  Visual fields are full. Extra-ocular movements intact. No ptosis. Face is symmetric Motor: Tone and bulk are normal. Power is 4/5 in left leg, subtle drift in left arm. Reflexes are symmetric, no pathologic reflexes present.  Sensory: Intact to light touch Gait: Hemiparetic, cane assisted.   Labs: I have reviewed the data as listed    Component Value Date/Time   NA 141 05/29/2022 0857   NA 140 03/29/2017 1045   K 4.1 05/29/2022 0857   CL 109 05/29/2022 0857   CO2 26 05/29/2022 0857   GLUCOSE 82 05/29/2022 0857   BUN 18 05/29/2022 0857   BUN 18 03/29/2017 1045   CREATININE 1.30 (H) 05/29/2022 0857   CALCIUM 9.6 05/29/2022 0857   PROT 7.0 05/29/2022 0857   PROT 7.7 03/29/2017 1045   ALBUMIN 4.4 05/29/2022 0857   ALBUMIN 5.2 03/29/2017 1045   AST 13 (L) 05/29/2022 0857   ALT 13 05/29/2022 0857   ALKPHOS 46 05/29/2022 0857   BILITOT 0.6 05/29/2022 0857   GFRNONAA >60 05/29/2022 0857   GFRAA 56 (L) 03/29/2017 1045   Lab Results  Component Value Date   WBC 6.6 05/29/2022   NEUTROABS 4.8 05/29/2022   HGB 16.0 05/29/2022   HCT 45.7 05/29/2022   MCV 104.3 (H) 05/29/2022   PLT 166 05/29/2022   Imaging:  [facility] Clinician Interpretation: I have personally reviewed the CNS images as listed.  My interpretation, in the context of the patient's clinical presentation, is stable disease  MR Brain W Wo Contrast  Result Date: 05/10/2022 CLINICAL DATA:  Brain/CNS neoplasm, monitor 3T SRS Protocol for salvage radiation treatment planning EXAM: MRI HEAD WITHOUT AND WITH CONTRAST TECHNIQUE: Multiplanar, multiecho pulse sequences of the brain and surrounding structures were obtained without and with intravenous contrast. CONTRAST:  10 mL Vueway COMPARISON:  MRI head April 20, 2022. FINDINGS: Brain: Mildly increased conspicuity of enhancement at the right frontal vertex lesion and at the right peritrigonal lesion with similar FLAIR hyperintensity. Unchanged nonenhancing expansile FLAIR  hyperintensity in the anterior right temporal lobe. Mild regional mass effect without midline shift. Benign DVA in the left posterior frontal lobe. No hydrocephalus. Vascular: Major arterial flow voids are maintained skull base. Skull and upper cervical spine:  Prior cranioplasty. Otherwise, normal signal. Sinuses/Orbits: Mild paranasal sinus mucosal thickening. Other: No mastoid effusions. IMPRESSION: 1. Mildly increased conspicuity of enhancement at the right frontal vertex lesion and at the right peritrigonal lesion with similar FLAIR hyperintensity. Unclear if this represents mild progression (particularly given short interval of follow-up) or differences in technique. Recommend continued follow-up. 2. Unchanged nonenhancing expansile FLAIR hyperintensity in the anterior right temporal lobe. Electronically Signed   By: Margaretha Sheffield M.D.   On: 05/10/2022 16:18     Assessment/Plan Glioblastoma multiforme of frontal lobe - Plan: Clinic Appointment Request, MR BRAIN W WO CONTRAST  Focal seizures  Jerry Richards is clinically stable today, now day 17/42 cycle #4 CCNU/avastin.  He tolerated radiation well with Dr. Isidore Moos which was completed last week.  For recurrent arthalgias secondary to avastin, we will defer today's infusion.  Will initiate prednisone 40mg  daily for pain, which had previously been very effective.   Will continue with cycle #4 CCNU 90mg /m2 PO q6 weeks, in addition to avastin, now 5mg /kg IV q2 weeks.  Avastin will be utilized because of high growth rate and tumor location near eloquent motor structures.  We reviewed side effects of CCNU, including nausea/vomiting, cytopenias.  Effects of avastin could include hypertension, bleedin/clotting, wound healing impairment.  We will check blood counts every 2 weeks concurrent with avastin pre-treat visits.     Avastin will be held today.  Going forward will transition to every 4 week infusion schedule.  Chemotherapy should be held for  the following:  ANC less than 1,000  Platelets less than 100,000  LFT or creatinine greater than 2x ULN  If clinical concerns/contraindications develop  Avastin should be held for the following:  ANC less than 500  Platelets less than 50,000  LFT or creatinine greater than 2x ULN  If clinical concerns/contraindications develop  Keppra will remain at 1000mg  BID.  May con't Tramadol 100mg  q8 PRN for breakthrough pain.  We ask that [name] return to clinic in 2 weeks for dose reduced avastin, or sooner as needed.  MRI scheduled for 06/24/22.  All questions were answered. The patient knows to call the clinic with any problems, questions or concerns. No barriers to learning were detected.  The total time spent in the encounter was 40 minutes and more than 50% was on counseling and review of test results   Ventura Sellers, MD Medical Director of Neuro-Oncology [facility] at [place] 05/29/22 11:05 AM

## 2022-06-06 ENCOUNTER — Telehealth: Payer: Self-pay | Admitting: Internal Medicine

## 2022-06-06 NOTE — Telephone Encounter (Signed)
Called patient per 4/9 secure chat. Patient scheduled and notified.

## 2022-06-08 ENCOUNTER — Other Ambulatory Visit: Payer: Self-pay

## 2022-06-12 ENCOUNTER — Inpatient Hospital Stay: Payer: Commercial Managed Care - HMO

## 2022-06-12 ENCOUNTER — Other Ambulatory Visit: Payer: Self-pay

## 2022-06-12 ENCOUNTER — Inpatient Hospital Stay (HOSPITAL_BASED_OUTPATIENT_CLINIC_OR_DEPARTMENT_OTHER): Payer: Commercial Managed Care - HMO | Admitting: Internal Medicine

## 2022-06-12 ENCOUNTER — Encounter: Payer: Self-pay | Admitting: Internal Medicine

## 2022-06-12 VITALS — BP 107/69 | HR 48 | Temp 98.9°F | Resp 18 | Wt 203.5 lb

## 2022-06-12 DIAGNOSIS — C719 Malignant neoplasm of brain, unspecified: Secondary | ICD-10-CM

## 2022-06-12 DIAGNOSIS — C711 Malignant neoplasm of frontal lobe: Secondary | ICD-10-CM | POA: Diagnosis not present

## 2022-06-12 DIAGNOSIS — R569 Unspecified convulsions: Secondary | ICD-10-CM

## 2022-06-12 DIAGNOSIS — Z5112 Encounter for antineoplastic immunotherapy: Secondary | ICD-10-CM | POA: Diagnosis not present

## 2022-06-12 LAB — CBC WITH DIFFERENTIAL (CANCER CENTER ONLY)
Abs Immature Granulocytes: 0.07 10*3/uL (ref 0.00–0.07)
Basophils Absolute: 0 10*3/uL (ref 0.0–0.1)
Basophils Relative: 0 %
Eosinophils Absolute: 0.1 10*3/uL (ref 0.0–0.5)
Eosinophils Relative: 1 %
HCT: 41.5 % (ref 39.0–52.0)
Hemoglobin: 14.8 g/dL (ref 13.0–17.0)
Immature Granulocytes: 0 %
Lymphocytes Relative: 4 %
Lymphs Abs: 0.8 10*3/uL (ref 0.7–4.0)
MCH: 36.5 pg — ABNORMAL HIGH (ref 26.0–34.0)
MCHC: 35.7 g/dL (ref 30.0–36.0)
MCV: 102.5 fL — ABNORMAL HIGH (ref 80.0–100.0)
Monocytes Absolute: 0.7 10*3/uL (ref 0.1–1.0)
Monocytes Relative: 4 %
Neutro Abs: 16.6 10*3/uL — ABNORMAL HIGH (ref 1.7–7.7)
Neutrophils Relative %: 91 %
Platelet Count: 103 10*3/uL — ABNORMAL LOW (ref 150–400)
RBC: 4.05 MIL/uL — ABNORMAL LOW (ref 4.22–5.81)
RDW: 14.4 % (ref 11.5–15.5)
WBC Count: 18.3 10*3/uL — ABNORMAL HIGH (ref 4.0–10.5)
nRBC: 0 % (ref 0.0–0.2)

## 2022-06-12 LAB — CMP (CANCER CENTER ONLY)
ALT: 12 U/L (ref 0–44)
AST: 11 U/L — ABNORMAL LOW (ref 15–41)
Albumin: 4.1 g/dL (ref 3.5–5.0)
Alkaline Phosphatase: 43 U/L (ref 38–126)
Anion gap: 7 (ref 5–15)
BUN: 29 mg/dL — ABNORMAL HIGH (ref 6–20)
CO2: 24 mmol/L (ref 22–32)
Calcium: 9.1 mg/dL (ref 8.9–10.3)
Chloride: 108 mmol/L (ref 98–111)
Creatinine: 1.4 mg/dL — ABNORMAL HIGH (ref 0.61–1.24)
GFR, Estimated: 60 mL/min — ABNORMAL LOW (ref >60)
Glucose, Bld: 149 mg/dL — ABNORMAL HIGH (ref 70–99)
Potassium: 3.5 mmol/L (ref 3.5–5.1)
Sodium: 139 mmol/L (ref 135–145)
Total Bilirubin: 0.6 mg/dL (ref 0.3–1.2)
Total Protein: 6.3 g/dL — ABNORMAL LOW (ref 6.5–8.1)

## 2022-06-12 LAB — TOTAL PROTEIN, URINE DIPSTICK: Protein, ur: NEGATIVE mg/dL

## 2022-06-12 MED ORDER — SODIUM CHLORIDE 0.9 % IV SOLN: Freq: Once | INTRAVENOUS | Status: AC

## 2022-06-12 MED ORDER — PREDNISONE 20 MG PO TABS: 20.0000 mg | ORAL_TABLET | Freq: Every day | ORAL | 1 refills | Status: DC

## 2022-06-12 MED ORDER — SODIUM CHLORIDE 0.9 % IV SOLN
5.0000 mg/kg | Freq: Once | INTRAVENOUS | Status: AC
  Administered 2022-06-12: 500 mg via INTRAVENOUS
  Filled 2022-06-12: qty 4

## 2022-06-12 NOTE — Addendum Note (Signed)
Addended by: Henreitta Leber on: 06/12/2022 02:07 PM   Modules accepted: Orders

## 2022-06-12 NOTE — Addendum Note (Signed)
Addended by: Henreitta Leber on: 06/12/2022 12:54 PM   Modules accepted: Orders

## 2022-06-12 NOTE — Patient Instructions (Signed)
[facility]  Discharge Instructions: Thank you for choosing [facility] to provide your oncology and hematology care.   If you have a lab appointment with the Cancer Center, please go directly to the Cancer Center and check in at the registration area.   Wear comfortable clothing and clothing appropriate for easy access to any Portacath or PICC line.   We strive to give you quality time with your provider. You may need to reschedule your appointment if you arrive late (15 or more minutes).  Arriving late affects you and other patients whose appointments are after yours.  Also, if you miss three or more appointments without notifying the office, you may be dismissed from the clinic at the provider's discretion.      For prescription refill requests, have your pharmacy contact our office and allow 72 hours for refills to be completed.    Today you received the following chemotherapy and/or immunotherapy agents: MVASI      To help prevent nausea and vomiting after your treatment, we encourage you to take your nausea medication as directed.  BELOW ARE SYMPTOMS THAT SHOULD BE REPORTED IMMEDIATELY: *FEVER GREATER THAN 100.4 F (38 C) OR HIGHER *CHILLS OR SWEATING *NAUSEA AND VOMITING THAT IS NOT CONTROLLED WITH YOUR NAUSEA MEDICATION *UNUSUAL SHORTNESS OF BREATH *UNUSUAL BRUISING OR BLEEDING *URINARY PROBLEMS (pain or burning when urinating, or frequent urination) *BOWEL PROBLEMS (unusual diarrhea, constipation, pain near the anus) TENDERNESS IN MOUTH AND THROAT WITH OR WITHOUT PRESENCE OF ULCERS (sore throat, sores in mouth, or a toothache) UNUSUAL RASH, SWELLING OR PAIN  UNUSUAL VAGINAL DISCHARGE OR ITCHING   Items with * indicate a potential emergency and should be followed up as soon as possible or go to the Emergency Department if any problems should occur.  Please show the CHEMOTHERAPY ALERT CARD or IMMUNOTHERAPY ALERT CARD at check-in  to the Emergency Department and triage nurse.  Should you have questions after your visit or need to cancel or reschedule your appointment, please contact [facility]  Dept: [phone]  and follow the prompts.  Office hours are 8:00 a.m. to 4:30 p.m. Monday - Friday. Please note that voicemails left after 4:00 p.m. may not be returned until the following business day.  We are closed weekends and major holidays. You have access to a nurse at all times for urgent questions. Please call the main number to the clinic Dept: [phone] and follow the prompts.   For any non-urgent questions, you may also contact your provider using MyChart. We now offer e-Visits for anyone 18 and older to request care online for non-urgent symptoms. For details visit [email or web address].   Also download the MyChart app! Go to the app store, search "MyChart", open the app, select [facility], and log in with your MyChart username and password.  Masks are optional in the cancer centers. If you would like for your care team to wear a mask while they are taking care of you, please let them know. You may have one support person who is at least 54 years old accompany you for your appointments. 

## 2022-06-12 NOTE — Progress Notes (Signed)
[facility] [address]  [place], [place] [address] [phone]   Interval Evaluation  Date of Service: 06/12/22 Patient Name: Jerry Richards Patient MRN: [id number] Patient DOB: 05/06/1970 Provider: Henreitta Leber, MD  Identifying Statement:  Jerry Richards is a 52 y.o. male with right frontal glioblastoma   Oncologic History: Oncology History  High grade glioma not classifiable by [facility] criteria  05/16/2021 Surgery   Craniotomy, R parafalcine resection with Dr. Jordan Likes; path is high grade glioma IDHwt   06/20/2021 - 06/20/2021 Chemotherapy   Patient is on Treatment Plan : BRAIN GLIOBLASTOMA Radiation Therapy With Concurrent Temozolomide 75 mg/m2 Daily Followed By Sequential Maintenance Temozolomide x 6-12 cycles     09/05/2021 - 12/17/2021 Chemotherapy   Completes 3 cycles of adjuvant 5-day Temodar   12/18/2021 Progression   Progression of disease #1   12/29/2021 -  Chemotherapy   Initiates second line therapy with CCNU /m2 q6 weeks, and Avastin /kg IV q2 weeks   Glioblastoma multiforme of frontal lobe  09/12/2021 Initial Diagnosis   Glioblastoma multiforme of frontal lobe (HCC)   12/29/2021 -  Chemotherapy   Patient is on Treatment Plan : BRAIN GBM Bevacizumab q14d       Biomarkers:  MGMT Unknown.  IDH 1/2 Wild type.  EGFR Unknown  TERT Unknown   Interval History: Jerry Richards presents today for avastin infusion.  Shoulder pain is significantly improved since resuming the prednisone, currently at  daily.  No longer needing tramadol.  No other new or progressive symptoms.  Denies any recurrence of seizures, no headaches.  Leg symptoms are stable.    H+P (06/07/21): Patient presented to neurologic attention in March with several days of progressive left sided weakness and seizures, characterized by twitching of the left leg.  CNS imaging demonstrated a right frontal mass.  He underwent craniotomy and resection with  Dr. Jordan Likes on 05/16/21; path demonstrated high grade glioma IDHwt.  Following surgery his left leg was much weaker, though it has improved with aggressive rehab efforts.  Currently he is using a cane to ambulate.  He has no other complaints, continues on decadron  twice per day.  No seizures since surgery.  Medications: Current Outpatient Medications on File Prior to Visit  Medication Sig Dispense Refill   acetaminophen (TYLENOL) 500 MG tablet Take 1,000 mg by mouth every 6 (six) hours as needed for moderate pain.     albuterol (VENTOLIN HFA) 108 (90 Base) MCG/ACT inhaler Inhale 1 puff into the lungs every 4 (four) hours as needed.     levETIRAcetam (KEPPRA) 500 MG tablet TAKE 2 TABLETS(1000 MG) BY MOUTH TWICE DAILY 360 tablet 0   losartan (COZAAR) 50 MG tablet Take 50 mg by mouth daily.  6   mirtazapine (REMERON) 15 MG tablet Take 15 mg by mouth at bedtime.     ondansetron (ZOFRAN) 8 MG tablet Take 1 tablet (8 mg total) by mouth every 8 (eight) hours as needed for nausea or vomiting. 30 tablet 1   predniSONE (DELTASONE) 20 MG tablet Take 2 tablets (40 mg total) by mouth daily with breakfast. 60 tablet 1   rivaroxaban (XARELTO) 20 MG TABS tablet Take 1 tablet (20 mg total) by mouth daily. 90 tablet 1   No current facility-administered medications on file prior to visit.    Allergies:  Allergies  Allergen Reactions   Amiodarone     AMIODARONE ANALOGUES Hyperthyroidism   Morphine And Related Itching and Palpitations  Chest pressure/palpitations   Past Medical History:  Past Medical History:  Diagnosis Date   Anxiety    Atrial fibrillation (HCC)    a. s/p RFCA x 2 (10/26/2011, 04/15/2012)   Atrial flutter (HCC)    Chronic kidney disease    Clotting disorder (HCC)    Colitis    Current smoker    Diverticulitis    Finger near amputation, left    Hyperthyroidism    Secondary to amiodarone   LA thrombus    Non-ischemic cardiomyopathy (HCC)    OSA on CPAP    Sinus trouble     Sleep apnea    Systolic heart failure (HCC)    EF 20-25%-> 50-55% in 10/2011. MOST RECENT ECHO (03/2012) EF= 66%   Past Surgical History:  Past Surgical History:  Procedure Laterality Date   APPLICATION OF CRANIAL NAVIGATION Right 05/16/2021   Procedure: APPLICATION OF CRANIAL NAVIGATION;  Surgeon: Julio Sicks, MD;  Location: [facility] OR;  Service: Neurosurgery;  Laterality: Right;   ATRIAL ABLATION SURGERY     CARDIAC CATHETERIZATION  08/2011   [facility]   CHOLECYSTECTOMY     CRANIOTOMY Right 05/16/2021   Procedure: Craniotomy - right - Parietal with  brainlab;  Surgeon: Julio Sicks, MD;  Location: [facility] OR;  Service: Neurosurgery;  Laterality: Right;   FINGER SURGERY     LEFT HEART CATHETERIZATION WITH CORONARY ANGIOGRAM N/A 09/12/2011   Procedure: LEFT HEART CATHETERIZATION WITH CORONARY ANGIOGRAM;  Surgeon: Herby Abraham, MD;  Location: [facility] CATH LAB;  Service: Cardiovascular;  Laterality: N/A;   VASECTOMY     Social History:  Social History   Socioeconomic History   Marital status: Married    Spouse name: tanya   Number of children: 2   Years of education: Not on file   Highest education level: Some college, no degree  Occupational History   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 Occas tea   Social Determinants of Health   Financial Resource Strain: Not on file  Food Insecurity: Not on file  Transportation Needs: Not on file  Physical Activity: Not on  file  Stress: Not on file  Social Connections: Not on file  Intimate Partner Violence: Not on file   Family History:  Family History  Problem Relation Age of Onset   Healthy Mother        age 59   Melanoma Mother    Melanoma Father    Atrial fibrillation Father        age 79   Diabetes Maternal Grandmother    Coronary artery disease Paternal Grandmother    Heart disease Paternal Grandmother    Heart attack Paternal Grandmother    Coronary artery disease Paternal Grandfather    Heart disease Paternal Grandfather    Heart attack Paternal Grandfather  Atrial fibrillation Paternal Aunt    Atrial fibrillation Paternal Uncle    Colon cancer Neg Hx    Esophageal cancer Neg Hx    Ovarian cancer Neg Hx    Stomach cancer Neg Hx    Rectal cancer Neg Hx     Review of Systems: Constitutional: Doesn't report fevers, chills or abnormal weight loss Eyes: Doesn't report blurriness of vision Ears, nose, mouth, throat, and face: Doesn't report sore throat Respiratory: Doesn't report cough, dyspnea or wheezes Cardiovascular: Doesn't report palpitation, chest discomfort  Gastrointestinal:  Doesn't report nausea, constipation, diarrhea GU: Doesn't report incontinence Skin: Doesn't report skin rashes Neurological: Per HPI Musculoskeletal: Doesn't report joint pain Behavioral/Psych: Doesn't report anxiety  Physical Exam: There were no vitals filed for this visit.  KPS: 80. General: Alert, cooperative, pleasant, in no acute distress Head: Normal EENT: No conjunctival injection or scleral icterus.  Lungs: Resp effort normal Cardiac: Regular rate Abdomen: Non-distended abdomen Skin: No rashes cyanosis or petechiae. Extremities: No clubbing or edema  Neurologic Exam: Mental Status: Awake, alert, attentive to examiner. Oriented to self and environment. Language is fluent with intact comprehension.  Cranial Nerves: Visual acuity is grossly normal. Visual fields are full. Extra-ocular  movements intact. No ptosis. Face is symmetric Motor: Tone and bulk are normal. Power is 4/5 in left leg, subtle drift in left arm. Reflexes are symmetric, no pathologic reflexes present.  Sensory: Intact to light touch Gait: Hemiparetic, cane assisted.   Labs: I have reviewed the data as listed    Component Value Date/Time   NA 141 05/29/2022 0857   NA 140 03/29/2017 1045   K 4.1 05/29/2022 0857   CL 109 05/29/2022 0857   CO2 26 05/29/2022 0857   GLUCOSE 82 05/29/2022 0857   BUN 18 05/29/2022 0857   BUN 18 03/29/2017 1045   CREATININE 1.30 (H) 05/29/2022 0857   CALCIUM 9.6 05/29/2022 0857   PROT 7.0 05/29/2022 0857   PROT 7.7 03/29/2017 1045   ALBUMIN 4.4 05/29/2022 0857   ALBUMIN 5.2 03/29/2017 1045   AST 13 (L) 05/29/2022 0857   ALT 13 05/29/2022 0857   ALKPHOS 46 05/29/2022 0857   BILITOT 0.6 05/29/2022 0857   GFRNONAA >60 05/29/2022 0857   GFRAA 56 (L) 03/29/2017 1045   Lab Results  Component Value Date   WBC 18.3 (H) 06/12/2022   NEUTROABS 16.6 (H) 06/12/2022   HGB 14.8 06/12/2022   HCT 41.5 06/12/2022   MCV 102.5 (H) 06/12/2022   PLT 103 (L) 06/12/2022    Assessment/Plan High grade glioma not classifiable by WHO criteria  Focal seizures  Jerry Richards is clinically stable today, now day 31/42 cycle #4 CCNU/avastin.    Will continue with cycle #4 CCNU /m2 PO q6 weeks, in addition to avastin, now /kg IV q2 weeks.  Avastin will be utilized because of high growth rate and tumor location near eloquent motor structures.  We reviewed side effects of CCNU, including nausea/vomiting, cytopenias.  Effects of avastin could include hypertension, bleedin/clotting, wound healing impairment.  We will check blood counts every 2 weeks concurrent with avastin pre-treat visits.     Avastin will given today at /kg.  Going forward will transition to every 4 week infusion schedule.  Chemotherapy should be held for the following:  ANC less than 1,000  Platelets  less than 100,000  LFT or creatinine greater than 2x ULN  If clinical concerns/contraindications develop  Avastin should be held for the following:  ANC less than 500  Platelets less than 50,000  LFT or creatinine greater than 2x ULN  If clinical concerns/contraindications develop  Prednisone will reduce to 20mg  daily.   Keppra will remain at 1000mg  BID.  May con't Tramadol 100mg  q8 PRN for breakthrough pain.  We ask that Thalia Party return to clinic in 2 weeks for MRI review.  All questions were answered. The patient knows to call the clinic with any problems, questions or concerns. No barriers to learning were detected.  The total time spent in the encounter was 30 minutes and more than 50% was on counseling and review of test results   Henreitta Leber, MD Medical Director of Neuro-Oncology [facility] 06/12/22 10:26 AM

## 2022-06-12 NOTE — Progress Notes (Signed)
Ok to proceed with Avastin treatment today 06/12/2022 per Dr Barbaraann Cao.

## 2022-06-22 ENCOUNTER — Ambulatory Visit
Admission: RE | Admit: 2022-06-22 | Discharge: 2022-06-22 | Disposition: A | Discharge status: Home / Self Care | Payer: Commercial Managed Care - HMO | Source: Ambulatory Visit | Attending: Internal Medicine | Admitting: Internal Medicine

## 2022-06-22 DIAGNOSIS — C711 Malignant neoplasm of frontal lobe: Secondary | ICD-10-CM

## 2022-06-22 MED ORDER — GADOPICLENOL 0.5 MMOL/ML IV SOLN
9.0000 mL | Freq: Once | INTRAVENOUS | Status: AC | PRN
  Administered 2022-06-22: 9 mL via INTRAVENOUS

## 2022-06-25 ENCOUNTER — Encounter: Payer: Self-pay | Admitting: Internal Medicine

## 2022-06-26 ENCOUNTER — Other Ambulatory Visit: Payer: Self-pay | Admitting: *Deleted

## 2022-06-26 ENCOUNTER — Inpatient Hospital Stay: Payer: Commercial Managed Care - HMO

## 2022-06-26 ENCOUNTER — Inpatient Hospital Stay (HOSPITAL_BASED_OUTPATIENT_CLINIC_OR_DEPARTMENT_OTHER): Payer: Commercial Managed Care - HMO | Admitting: Internal Medicine

## 2022-06-26 VITALS — BP 110/73 | HR 51 | Temp 98.3°F | Resp 20 | Wt 202.2 lb

## 2022-06-26 DIAGNOSIS — C719 Malignant neoplasm of brain, unspecified: Secondary | ICD-10-CM

## 2022-06-26 DIAGNOSIS — Z5112 Encounter for antineoplastic immunotherapy: Secondary | ICD-10-CM | POA: Diagnosis not present

## 2022-06-26 LAB — CMP (CANCER CENTER ONLY)
ALT: 17 U/L (ref 0–44)
AST: 16 U/L (ref 15–41)
Albumin: 4.1 g/dL (ref 3.5–5.0)
Alkaline Phosphatase: 36 U/L — ABNORMAL LOW (ref 38–126)
Anion gap: 8 (ref 5–15)
BUN: 24 mg/dL — ABNORMAL HIGH (ref 6–20)
CO2: 24 mmol/L (ref 22–32)
Calcium: 9.9 mg/dL (ref 8.9–10.3)
Chloride: 108 mmol/L (ref 98–111)
Creatinine: 1.28 mg/dL — ABNORMAL HIGH (ref 0.61–1.24)
GFR, Estimated: >60 mL/min (ref >60)
Glucose, Bld: 104 mg/dL — ABNORMAL HIGH (ref 70–99)
Potassium: 4.2 mmol/L (ref 3.5–5.1)
Sodium: 140 mmol/L (ref 135–145)
Total Bilirubin: 0.8 mg/dL (ref 0.3–1.2)
Total Protein: 6.3 g/dL — ABNORMAL LOW (ref 6.5–8.1)

## 2022-06-26 LAB — CBC WITH DIFFERENTIAL (CANCER CENTER ONLY)
Abs Immature Granulocytes: 0.02 10*3/uL (ref 0.00–0.07)
Basophils Absolute: 0 10*3/uL (ref 0.0–0.1)
Basophils Relative: 0 %
Eosinophils Absolute: 0 10*3/uL (ref 0.0–0.5)
Eosinophils Relative: 1 %
HCT: 39.6 % (ref 39.0–52.0)
Hemoglobin: 13.9 g/dL (ref 13.0–17.0)
Immature Granulocytes: 0 %
Lymphocytes Relative: 6 %
Lymphs Abs: 0.3 10*3/uL — ABNORMAL LOW (ref 0.7–4.0)
MCH: 36.5 pg — ABNORMAL HIGH (ref 26.0–34.0)
MCHC: 35.1 g/dL (ref 30.0–36.0)
MCV: 103.9 fL — ABNORMAL HIGH (ref 80.0–100.0)
Monocytes Absolute: 0.2 10*3/uL (ref 0.1–1.0)
Monocytes Relative: 4 %
Neutro Abs: 4.7 10*3/uL (ref 1.7–7.7)
Neutrophils Relative %: 89 %
Platelet Count: 194 10*3/uL (ref 150–400)
RBC: 3.81 MIL/uL — ABNORMAL LOW (ref 4.22–5.81)
RDW: 14.7 % (ref 11.5–15.5)
WBC Count: 5.3 10*3/uL (ref 4.0–10.5)
nRBC: 0 % (ref 0.0–0.2)

## 2022-06-26 LAB — TOTAL PROTEIN, URINE DIPSTICK: Protein, ur: NEGATIVE mg/dL

## 2022-06-26 NOTE — Progress Notes (Signed)
[facility] [address]  [place], [place] [address] [phone]   Interval Evaluation  Date of Service: 06/26/22 Patient Name: Jerry Richards Patient MRN: [id number] Patient DOB: 1970/04/06 Provider: Henreitta Leber, MD  Identifying Statement:  Jerry Richards is a 52 y.o. male with right frontal glioblastoma   Oncologic History: Oncology History  High grade glioma not classifiable by WHO criteria (HCC)  05/16/2021 Surgery   Craniotomy, R parafalcine resection with Dr. Jordan Likes; path is high grade glioma IDHwt   06/20/2021 - 06/20/2021 Chemotherapy   Patient is on Treatment Plan : BRAIN GLIOBLASTOMA Radiation Therapy With Concurrent Temozolomide 75 mg/m2 Daily Followed By Sequential Maintenance Temozolomide x 6-12 cycles     09/05/2021 - 12/17/2021 Chemotherapy   Completes 3 cycles of adjuvant 5-day Temodar   12/18/2021 Progression   Progression of disease #1   12/29/2021 -  Chemotherapy   Initiates second line therapy with CCNU 90mg /m2 q6 weeks, and Avastin 10mg /kg IV q2 weeks   06/12/2022 -  Chemotherapy   Patient is on Treatment Plan : BRAIN GBM Bevacizumab 14d x 6 cycles     Glioblastoma multiforme of frontal lobe (HCC)  09/12/2021 Initial Diagnosis   Glioblastoma multiforme of frontal lobe (HCC)   12/29/2021 - 05/11/2022 Chemotherapy   Patient is on Treatment Plan : BRAIN GBM Bevacizumab q14d       Biomarkers:  MGMT Unknown.  IDH 1/2 Wild type.  EGFR Unknown  TERT Unknown   Interval History: Jerry Richards presents today following recent MRI brain.  Shoulder pain remains stable, prednisone currently at 20mg  daily.  No longer needing tramadol.  No other new or progressive symptoms.  Denies any recurrence of seizures, no headaches.  Leg symptoms are stable.    H+P (06/07/21): Patient presented to neurologic attention in March with several days of progressive left sided weakness and seizures, characterized by twitching of the  left leg.  CNS imaging demonstrated a right frontal mass.  He underwent craniotomy and resection with Dr. Jordan Likes on 05/16/21; path demonstrated high grade glioma IDHwt.  Following surgery his left leg was much weaker, though it has improved with aggressive rehab efforts.  Currently he is using a cane to ambulate.  He has no other complaints, continues on decadron 2mg  twice per day.  No seizures since surgery.  Medications: Current Outpatient Medications on File Prior to Visit  Medication Sig Dispense Refill   acetaminophen (TYLENOL) 500 MG tablet Take 1,000 mg by mouth every 6 (six) hours as needed for moderate pain.     albuterol (VENTOLIN HFA) 108 (90 Base) MCG/ACT inhaler Inhale 1 puff into the lungs every 4 (four) hours as needed.     levETIRAcetam (KEPPRA) 500 MG tablet TAKE 2 TABLETS(1000 MG) BY MOUTH TWICE DAILY 360 tablet 0   losartan (COZAAR) 50 MG tablet Take 50 mg by mouth daily.  6   mirtazapine (REMERON) 15 MG tablet Take 15 mg by mouth at bedtime.     predniSONE (DELTASONE) 20 MG tablet Take 1 tablet (20 mg total) by mouth daily with breakfast. 30 tablet 1   rivaroxaban (XARELTO) 20 MG TABS tablet Take 1 tablet (20 mg total) by mouth daily. 90 tablet 1   No current facility-administered medications on file prior to visit.    Allergies:  Allergies  Allergen Reactions   Amiodarone     AMIODARONE ANALOGUES Hyperthyroidism   Morphine And Related Itching and Palpitations    Chest pressure/palpitations  Past Medical History:  Past Medical History:  Diagnosis Date   Anxiety    Atrial fibrillation (HCC)    a. s/p RFCA x 2 (10/26/2011, 04/15/2012)   Atrial flutter (HCC)    Chronic kidney disease    Clotting disorder (HCC)    Colitis    Current smoker    Diverticulitis    Finger near amputation, left    Hyperthyroidism    Secondary to amiodarone   LA thrombus    Non-ischemic cardiomyopathy (HCC)    OSA on CPAP    Sinus trouble    Sleep apnea    Systolic heart failure  (HCC)    EF 20-25%-> 50-55% in 10/2011. MOST RECENT ECHO (03/2012) EF= 66%   Past Surgical History:  Past Surgical History:  Procedure Laterality Date   APPLICATION OF CRANIAL NAVIGATION Right 05/16/2021   Procedure: APPLICATION OF CRANIAL NAVIGATION;  Surgeon: Julio Sicks, MD;  Location: [facility] OR;  Service: Neurosurgery;  Laterality: Right;   ATRIAL ABLATION SURGERY     CARDIAC CATHETERIZATION  08/2011   [facility]   CHOLECYSTECTOMY     CRANIOTOMY Right 05/16/2021   Procedure: Craniotomy - right - Parietal with  brainlab;  Surgeon: Julio Sicks, MD;  Location: [facility] OR;  Service: Neurosurgery;  Laterality: Right;   FINGER SURGERY     LEFT HEART CATHETERIZATION WITH CORONARY ANGIOGRAM N/A 09/12/2011   Procedure: LEFT HEART CATHETERIZATION WITH CORONARY ANGIOGRAM;  Surgeon: Herby Abraham, MD;  Location: [facility] CATH LAB;  Service: Cardiovascular;  Laterality: N/A;   VASECTOMY     Social History:  Social History   Socioeconomic History   Marital status: Married    Spouse name: tanya   Number of children: 2   Years of education: Not on file   Highest education level: Some college, no degree  Occupational History   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 Occas tea   Social Determinants of Health   Financial Resource Strain: Not on file  Food Insecurity: Not on file  Transportation Needs: Not on file  Physical Activity: Not on file  Stress: Not on file  Social  Connections: Not on file  Intimate Partner Violence: Not on file   Family History:  Family History  Problem Relation Age of Onset   Healthy Mother        age 36   Melanoma Mother    Melanoma Father    Atrial fibrillation Father        age 79   Diabetes Maternal Grandmother    Coronary artery disease Paternal Grandmother    Heart disease Paternal Grandmother    Heart attack Paternal Grandmother    Coronary artery disease Paternal Grandfather    Heart disease Paternal Grandfather    Heart attack Paternal Grandfather    Atrial fibrillation  Paternal Aunt    Atrial fibrillation Paternal Uncle    Colon cancer Neg Hx    Esophageal cancer Neg Hx    Ovarian cancer Neg Hx    Stomach cancer Neg Hx    Rectal cancer Neg Hx     Review of Systems: Constitutional: Doesn't report fevers, chills or abnormal weight loss Eyes: Doesn't report blurriness of vision Ears, nose, mouth, throat, and face: Doesn't report sore throat Respiratory: Doesn't report cough, dyspnea or wheezes Cardiovascular: Doesn't report palpitation, chest discomfort  Gastrointestinal:  Doesn't report nausea, constipation, diarrhea GU: Doesn't report incontinence Skin: Doesn't report skin rashes Neurological: Per HPI Musculoskeletal: Doesn't report joint pain Behavioral/Psych: Doesn't report anxiety  Physical Exam: Vitals:   06/26/22 1210  BP: 110/73  Pulse: (!) 51  Resp: 20  Temp: 98.3 F (36.8 C)  SpO2: 99%    KPS: 80. General: Alert, cooperative, pleasant, in no acute distress Head: Normal EENT: No conjunctival injection or scleral icterus.  Lungs: Resp effort normal Cardiac: Regular rate Abdomen: Non-distended abdomen Skin: No rashes cyanosis or petechiae. Extremities: No clubbing or edema  Neurologic Exam: Mental Status: Awake, alert, attentive to examiner. Oriented to self and environment. Language is fluent with intact comprehension.  Cranial Nerves: Visual acuity is grossly normal. Visual  fields are full. Extra-ocular movements intact. No ptosis. Face is symmetric Motor: Tone and bulk are normal. Power is 4/5 in left leg, subtle drift in left arm. Reflexes are symmetric, no pathologic reflexes present.  Sensory: Intact to light touch Gait: Hemiparetic, cane assisted.   Labs: I have reviewed the data as listed    Component Value Date/Time   NA 140 06/26/2022 1153   NA 140 03/29/2017 1045   K 4.2 06/26/2022 1153   CL 108 06/26/2022 1153   CO2 24 06/26/2022 1153   GLUCOSE 104 (H) 06/26/2022 1153   BUN 24 (H) 06/26/2022 1153   BUN 18 03/29/2017 1045   CREATININE 1.28 (H) 06/26/2022 1153   CALCIUM 9.9 06/26/2022 1153   PROT 6.3 (L) 06/26/2022 1153   PROT 7.7 03/29/2017 1045   ALBUMIN 4.1 06/26/2022 1153   ALBUMIN 5.2 03/29/2017 1045   AST 16 06/26/2022 1153   ALT 17 06/26/2022 1153   ALKPHOS 36 (L) 06/26/2022 1153   BILITOT 0.8 06/26/2022 1153   GFRNONAA >60 06/26/2022 1153   GFRAA 56 (L) 03/29/2017 1045   Lab Results  Component Value Date   WBC 5.3 06/26/2022   NEUTROABS 4.7 06/26/2022   HGB 13.9 06/26/2022   HCT 39.6 06/26/2022   MCV 103.9 (H) 06/26/2022   PLT 194 06/26/2022   Imaging:  [facility] Clinician Interpretation: I have personally reviewed the CNS images as listed.  My interpretation, in the context of the patient's clinical presentation, is progressive disease  MR BRAIN W WO CONTRAST  Result Date: 06/26/2022 CLINICAL DATA:  Brain/CNS neoplasm, assess treatment response EXAM: MRI HEAD WITHOUT AND WITH CONTRAST TECHNIQUE: Multiplanar, multiecho pulse sequences of the brain and surrounding structures were obtained without and with intravenous contrast. CONTRAST:  June 22, 2022 MRI head. COMPARISON:  9 mL of Vueway FINDINGS: Brain: No substantial change signal abnormality and enhancement at the right frontal vertex. Mild interval increase in extent and conspicuity/bulk of enhancement and signal change involving the peritrigonal white matter, particularly  superiorly and along the splenium of the corpus callosum. Similar nonenhancing T2/FLAIR hyperintensity involving the anterior right temporal lobe. No evidence of acute infarct, acute hemorrhage, midline shift or hydrocephalus. Vascular: Major arterial flow voids  are maintained at the skull base. Skull and upper cervical spine: Normal marrow signal. Sinuses/Orbits: Negative. Other: No mastoid effusions. IMPRESSION: 1. Mild interval increase in extent and conspicuity/bulk of enhancement and signal change involving the peritrigonal white matter, particularly superiorly and along the splenium of the corpus callosum. 2. No substantial change in signal abnormality and enhancement at the right frontal vertex. 3. Similar nonenhancing T2/FLAIR hyperintensity involving the anterior right temporal lobe. Electronically Signed   By: Feliberto Harts M.D.   On: 06/26/2022 13:14   Assessment/Plan High grade glioma not classifiable by WHO criteria (HCC)  Thalia Party is clinically stable today, now having completed cycle #4 CCNU/avastin.  MRI brain demonstrates subtle increase in enhancing volume within right parietal disease focus.  Right frontal and temporal regions appear stable.  Will consider transitioning to cycle #5 CCNU/avastin, or transitioning to next line of therapy.  Case will be discussed in detail with neuroradiology in CNS tumor board.  Could consider CPT-11/avastin IV q2 weeks.  Avastin will be deferred today given potential change in therapy.  Chemotherapy should be held for the following:  ANC less than 1,000  Platelets less than 100,000  LFT or creatinine greater than 2x ULN  If clinical concerns/contraindications develop  Avastin should be held for the following:  ANC less than 500  Platelets less than 50,000  LFT or creatinine greater than 2x ULN  If clinical concerns/contraindications develop  Prednisone will continue 20mg  daily.   Keppra will remain at 1000mg  BID.  May  con't Tramadol 100mg  q8 PRN for breakthrough pain.  We ask that Thalia Party return to clinic pending new treatment plan.  All questions were answered. The patient knows to call the clinic with any problems, questions or concerns. No barriers to learning were detected.  The total time spent in the encounter was 40 minutes and more than 50% was on counseling and review of test results   Henreitta Leber, MD Medical Director of Neuro-Oncology [facility] [place] 06/26/22 2:36 PM

## 2022-06-27 ENCOUNTER — Inpatient Hospital Stay (HOSPITAL_BASED_OUTPATIENT_CLINIC_OR_DEPARTMENT_OTHER): Payer: Commercial Managed Care - HMO | Admitting: Internal Medicine

## 2022-06-27 ENCOUNTER — Encounter: Payer: Self-pay | Admitting: Internal Medicine

## 2022-06-27 ENCOUNTER — Ambulatory Visit: Payer: Commercial Managed Care - HMO | Admitting: Radiation Oncology

## 2022-06-27 DIAGNOSIS — C719 Malignant neoplasm of brain, unspecified: Secondary | ICD-10-CM

## 2022-06-27 NOTE — Progress Notes (Signed)
I connected with Jerry Richards on 06/27/22 at  9:30 AM EDT by telephone visit and verified that I am speaking with the correct person using two identifiers.  I discussed the limitations, risks, security and privacy concerns of performing an evaluation and management service by telemedicine and the availability of in-person appointments. I also discussed with the patient that there may be a patient responsible charge related to this service. The patient expressed understanding and agreed to proceed.   Other persons participating in the visit and their role in the encounter:  n/a  Patient's location:  Home Provider's location:  Office  Chief Complaint:  High grade glioma not classifiable by WHO criteria (HCC)  History of Present Ilness: Jerry Richards reports no clinical changes today.  No headaches, denies seizures.  Shoulder discomfort stable on 20mg  prednisone.    Observations: Language and cognition at baseline  Imaging:  [facility] Clinician Interpretation: I have personally reviewed the CNS images as listed.  My interpretation, in the context of the patient's clinical presentation, is progressive disease  MR BRAIN W WO CONTRAST  Result Date: 06/26/2022 CLINICAL DATA:  Brain/CNS neoplasm, assess treatment response EXAM: MRI HEAD WITHOUT AND WITH CONTRAST TECHNIQUE: Multiplanar, multiecho pulse sequences of the brain and surrounding structures were obtained without and with intravenous contrast. CONTRAST:  June 22, 2022 MRI head. COMPARISON:  9 mL of Vueway FINDINGS: Brain: No substantial change signal abnormality and enhancement at the right frontal vertex. Mild interval increase in extent and conspicuity/bulk of enhancement and signal change involving the peritrigonal white matter, particularly superiorly and along the splenium of the corpus callosum. Similar nonenhancing T2/FLAIR hyperintensity involving the anterior right temporal lobe. No evidence of acute infarct, acute hemorrhage, midline  shift or hydrocephalus. Vascular: Major arterial flow voids are maintained at the skull base. Skull and upper cervical spine: Normal marrow signal. Sinuses/Orbits: Negative. Other: No mastoid effusions. IMPRESSION: 1. Mild interval increase in extent and conspicuity/bulk of enhancement and signal change involving the peritrigonal white matter, particularly superiorly and along the splenium of the corpus callosum. 2. No substantial change in signal abnormality and enhancement at the right frontal vertex. 3. Similar nonenhancing T2/FLAIR hyperintensity involving the anterior right temporal lobe. Electronically Signed   By: Feliberto Harts M.D.   On: 06/26/2022 13:14     Assessment and Plan: High grade glioma not classifiable by WHO criteria (HCC)  We recommended initiating treatment with Irinotecan 125 mg/m2 IV q2 weeks, with concurrent Avastin 5mg /kg. The patient will have a complete blood count and CMP performed on days 14 and 28 of each cycle, and a urine protein performed on day 28 of each cycle. Labs may need to be performed more often. Zofran will prescribed for home use for nausea/vomiting.  We reviewed side effects of Irinotecan, including diarrhea.  Imodium will prescribed for home use.   Informed consent was obtained verbally to proceed with chemotherapy.  Chemotherapy should be held for the following:  ANC less than 1,000  Platelets less than 100,000  LFT or creatinine greater than 2x ULN  If clinical concerns/contraindications develop  Follow Up Instructions: RTC for day 1, cycle 1 as discussed  I discussed the assessment and treatment plan with the patient.  The patient was provided an opportunity to ask questions and all were answered.  The patient agreed with the plan and demonstrated understanding of the instructions.    The patient was advised to call back or seek an in-person evaluation if the symptoms worsen or if  the condition fails to improve as anticipated.    Henreitta Leber, MD   I provided 22 minutes of non face-to-face telephone visit time during this encounter, and > 50% was spent counseling as documented under my assessment & plan.

## 2022-06-29 ENCOUNTER — Telehealth: Payer: Self-pay | Admitting: Internal Medicine

## 2022-06-29 ENCOUNTER — Other Ambulatory Visit: Payer: Self-pay | Admitting: Radiation Therapy

## 2022-06-29 ENCOUNTER — Other Ambulatory Visit: Payer: Self-pay

## 2022-06-29 NOTE — Telephone Encounter (Signed)
Scheduled per provider orders, patient has been called and voicemail was left.

## 2022-07-03 ENCOUNTER — Inpatient Hospital Stay: Payer: Commercial Managed Care - HMO

## 2022-07-03 ENCOUNTER — Inpatient Hospital Stay: Payer: Commercial Managed Care - HMO | Attending: Neurosurgery

## 2022-07-03 ENCOUNTER — Inpatient Hospital Stay (HOSPITAL_BASED_OUTPATIENT_CLINIC_OR_DEPARTMENT_OTHER): Payer: Commercial Managed Care - HMO | Admitting: Internal Medicine

## 2022-07-03 ENCOUNTER — Encounter: Payer: Self-pay | Admitting: *Deleted

## 2022-07-03 VITALS — BP 113/80 | HR 64 | Temp 98.4°F | Resp 18

## 2022-07-03 VITALS — BP 124/79 | HR 50 | Temp 97.7°F | Resp 20 | Wt 201.5 lb

## 2022-07-03 DIAGNOSIS — Z7952 Long term (current) use of systemic steroids: Secondary | ICD-10-CM | POA: Insufficient documentation

## 2022-07-03 DIAGNOSIS — R569 Unspecified convulsions: Secondary | ICD-10-CM

## 2022-07-03 DIAGNOSIS — Z833 Family history of diabetes mellitus: Secondary | ICD-10-CM | POA: Diagnosis not present

## 2022-07-03 DIAGNOSIS — Z885 Allergy status to narcotic agent status: Secondary | ICD-10-CM | POA: Insufficient documentation

## 2022-07-03 DIAGNOSIS — C711 Malignant neoplasm of frontal lobe: Secondary | ICD-10-CM | POA: Diagnosis present

## 2022-07-03 DIAGNOSIS — M25519 Pain in unspecified shoulder: Secondary | ICD-10-CM | POA: Insufficient documentation

## 2022-07-03 DIAGNOSIS — R531 Weakness: Secondary | ICD-10-CM | POA: Insufficient documentation

## 2022-07-03 DIAGNOSIS — Z7901 Long term (current) use of anticoagulants: Secondary | ICD-10-CM | POA: Diagnosis not present

## 2022-07-03 DIAGNOSIS — Z79630 Long term (current) use of alkylating agent: Secondary | ICD-10-CM | POA: Diagnosis not present

## 2022-07-03 DIAGNOSIS — F17210 Nicotine dependence, cigarettes, uncomplicated: Secondary | ICD-10-CM | POA: Diagnosis not present

## 2022-07-03 DIAGNOSIS — Z5111 Encounter for antineoplastic chemotherapy: Secondary | ICD-10-CM | POA: Insufficient documentation

## 2022-07-03 DIAGNOSIS — Z5112 Encounter for antineoplastic immunotherapy: Secondary | ICD-10-CM | POA: Diagnosis not present

## 2022-07-03 DIAGNOSIS — R197 Diarrhea, unspecified: Secondary | ICD-10-CM | POA: Insufficient documentation

## 2022-07-03 DIAGNOSIS — Z79899 Other long term (current) drug therapy: Secondary | ICD-10-CM | POA: Diagnosis not present

## 2022-07-03 DIAGNOSIS — C719 Malignant neoplasm of brain, unspecified: Secondary | ICD-10-CM | POA: Diagnosis not present

## 2022-07-03 DIAGNOSIS — Z808 Family history of malignant neoplasm of other organs or systems: Secondary | ICD-10-CM | POA: Insufficient documentation

## 2022-07-03 DIAGNOSIS — Z9049 Acquired absence of other specified parts of digestive tract: Secondary | ICD-10-CM | POA: Diagnosis not present

## 2022-07-03 DIAGNOSIS — Z8249 Family history of ischemic heart disease and other diseases of the circulatory system: Secondary | ICD-10-CM | POA: Insufficient documentation

## 2022-07-03 DIAGNOSIS — Z79634 Long term (current) use of topoisomerase inhibitor: Secondary | ICD-10-CM | POA: Insufficient documentation

## 2022-07-03 LAB — CBC WITH DIFFERENTIAL (CANCER CENTER ONLY)
Abs Immature Granulocytes: 0.06 10*3/uL (ref 0.00–0.07)
Basophils Absolute: 0 10*3/uL (ref 0.0–0.1)
Basophils Relative: 0 %
Eosinophils Absolute: 0.1 10*3/uL (ref 0.0–0.5)
Eosinophils Relative: 1 %
HCT: 42.6 % (ref 39.0–52.0)
Hemoglobin: 14.5 g/dL (ref 13.0–17.0)
Immature Granulocytes: 1 %
Lymphocytes Relative: 12 %
Lymphs Abs: 0.9 10*3/uL (ref 0.7–4.0)
MCH: 35.8 pg — ABNORMAL HIGH (ref 26.0–34.0)
MCHC: 34 g/dL (ref 30.0–36.0)
MCV: 105.2 fL — ABNORMAL HIGH (ref 80.0–100.0)
Monocytes Absolute: 0.5 10*3/uL (ref 0.1–1.0)
Monocytes Relative: 7 %
Neutro Abs: 5.8 10*3/uL (ref 1.7–7.7)
Neutrophils Relative %: 79 %
Platelet Count: 217 10*3/uL (ref 150–400)
RBC: 4.05 MIL/uL — ABNORMAL LOW (ref 4.22–5.81)
RDW: 14.6 % (ref 11.5–15.5)
WBC Count: 7.3 10*3/uL (ref 4.0–10.5)
nRBC: 0 % (ref 0.0–0.2)

## 2022-07-03 LAB — CMP (CANCER CENTER ONLY)
ALT: 19 U/L (ref 0–44)
AST: 13 U/L — ABNORMAL LOW (ref 15–41)
Albumin: 4.1 g/dL (ref 3.5–5.0)
Alkaline Phosphatase: 39 U/L (ref 38–126)
Anion gap: 7 (ref 5–15)
BUN: 21 mg/dL — ABNORMAL HIGH (ref 6–20)
CO2: 27 mmol/L (ref 22–32)
Calcium: 9.2 mg/dL (ref 8.9–10.3)
Chloride: 106 mmol/L (ref 98–111)
Creatinine: 1.31 mg/dL — ABNORMAL HIGH (ref 0.61–1.24)
GFR, Estimated: >60 mL/min (ref >60)
Glucose, Bld: 95 mg/dL (ref 70–99)
Potassium: 3.8 mmol/L (ref 3.5–5.1)
Sodium: 140 mmol/L (ref 135–145)
Total Bilirubin: 0.6 mg/dL (ref 0.3–1.2)
Total Protein: 6.3 g/dL — ABNORMAL LOW (ref 6.5–8.1)

## 2022-07-03 LAB — TOTAL PROTEIN, URINE DIPSTICK: Protein, ur: NEGATIVE mg/dL

## 2022-07-03 MED ORDER — PALONOSETRON HCL INJECTION 0.25 MG/5ML
0.2500 mg | Freq: Once | INTRAVENOUS | Status: AC
  Administered 2022-07-03: 0.25 mg via INTRAVENOUS
  Filled 2022-07-03: qty 5

## 2022-07-03 MED ORDER — SODIUM CHLORIDE 0.9 % IV SOLN: Freq: Once | INTRAVENOUS | Status: AC

## 2022-07-03 MED ORDER — ATROPINE SULFATE 1 MG/ML IV SOLN
0.5000 mg | Freq: Once | INTRAVENOUS | Status: DC | PRN
  Filled 2022-07-03: qty 1

## 2022-07-03 MED ORDER — SODIUM CHLORIDE 0.9 % IV SOLN
10.0000 mg | Freq: Once | INTRAVENOUS | Status: AC
  Administered 2022-07-03: 10 mg via INTRAVENOUS
  Filled 2022-07-03: qty 10

## 2022-07-03 MED ORDER — SODIUM CHLORIDE 0.9 % IV SOLN
125.0000 mg/m2 | Freq: Once | INTRAVENOUS | Status: AC
  Administered 2022-07-03: 300 mg via INTRAVENOUS
  Filled 2022-07-03: qty 15

## 2022-07-03 MED ORDER — SODIUM CHLORIDE 0.9 % IV SOLN
5.0000 mg/kg | Freq: Once | INTRAVENOUS | Status: AC
  Administered 2022-07-03: 500 mg via INTRAVENOUS
  Filled 2022-07-03: qty 4

## 2022-07-03 NOTE — Progress Notes (Signed)
[facility] at [facility] [address]  [place], [place] [address] [phone]   Interval Evaluation  Date of Service: 07/03/22 Patient Name: Jerry Richards Patient MRN: [id number] Patient DOB: 06/25/1970 Provider: Henreitta Leber, MD  Identifying Statement:  Jerry Richards is a 52 y.o. male with right frontal glioblastoma   Oncologic History: Oncology History  High grade glioma not classifiable by WHO criteria (HCC)  05/16/2021 Surgery   Craniotomy, R parafalcine resection with Dr. Jordan Likes; path is high grade glioma IDHwt   06/20/2021 - 06/20/2021 Chemotherapy   Patient is on Treatment Plan : BRAIN GLIOBLASTOMA Radiation Therapy With Concurrent Temozolomide 75 mg/m2 Daily Followed By Sequential Maintenance Temozolomide x 6-12 cycles     09/05/2021 - 12/17/2021 Chemotherapy   Completes 3 cycles of adjuvant 5-day Temodar   12/18/2021 Progression   Progression of disease #1   12/29/2021 -  Chemotherapy   Initiates second line therapy with CCNU 90mg /m2 q6 weeks, and Avastin 10mg /kg IV q2 weeks   06/12/2022 - 06/12/2022 Chemotherapy   Patient is on Treatment Plan : BRAIN GBM Bevacizumab 14d x 6 cycles     06/26/2022 -  Chemotherapy   Patient is on Treatment Plan : BRAIN GBM [facility] Irinotecan D1,15 + Bevacizumab D1,15 q 28d     Glioblastoma multiforme of frontal lobe (HCC)  09/12/2021 Initial Diagnosis   Glioblastoma multiforme of frontal lobe (HCC)   12/29/2021 - 05/11/2022 Chemotherapy   Patient is on Treatment Plan : BRAIN GBM Bevacizumab q14d       Biomarkers:  MGMT Unknown.  IDH 1/2 Wild type.  EGFR Unknown  TERT Unknown   Interval History: Jerry Richards presents today for initiation of Irinotecan and Avastin.  Shoulder pain remains stable, prednisone currently at 20mg  daily.  No longer needing tramadol.  No other new or progressive symptoms.  Denies any recurrence of seizures, no headaches.  Leg symptoms are stable.    H+P (06/07/21): Patient  presented to neurologic attention in March with several days of progressive left sided weakness and seizures, characterized by twitching of the left leg.  CNS imaging demonstrated a right frontal mass.  He underwent craniotomy and resection with Dr. Jordan Likes on 05/16/21; path demonstrated high grade glioma IDHwt.  Following surgery his left leg was much weaker, though it has improved with aggressive rehab efforts.  Currently he is using a cane to ambulate.  He has no other complaints, continues on decadron 2mg  twice per day.  No seizures since surgery.  Medications: Current Outpatient Medications on File Prior to Visit  Medication Sig Dispense Refill   acetaminophen (TYLENOL) 500 MG tablet Take 1,000 mg by mouth every 6 (six) hours as needed for moderate pain.     albuterol (VENTOLIN HFA) 108 (90 Base) MCG/ACT inhaler Inhale 1 puff into the lungs every 4 (four) hours as needed.     levETIRAcetam (KEPPRA) 500 MG tablet TAKE 2 TABLETS(1000 MG) BY MOUTH TWICE DAILY 360 tablet 0   losartan (COZAAR) 50 MG tablet Take 50 mg by mouth daily.  6   mirtazapine (REMERON) 15 MG tablet Take 15 mg by mouth at bedtime.     predniSONE (DELTASONE) 20 MG tablet Take 1 tablet (20 mg total) by mouth daily with breakfast. 30 tablet 1   rivaroxaban (XARELTO) 20 MG TABS tablet Take 1 tablet (20 mg total) by mouth daily. 90 tablet 1   No current facility-administered medications on file prior to visit.    Allergies:  Allergies  Allergen Reactions   Amiodarone     AMIODARONE ANALOGUES Hyperthyroidism   Morphine And Related Itching and Palpitations    Chest pressure/palpitations   Past Medical History:  Past Medical History:  Diagnosis Date   Anxiety    Atrial fibrillation (HCC)    a. s/p RFCA x 2 (10/26/2011, 04/15/2012)   Atrial flutter (HCC)    Chronic kidney disease    Clotting disorder (HCC)    Colitis    Current smoker    Diverticulitis    Finger near amputation, left    Hyperthyroidism    Secondary to  amiodarone   LA thrombus    Non-ischemic cardiomyopathy (HCC)    OSA on CPAP    Sinus trouble    Sleep apnea    Systolic heart failure (HCC)    EF 20-25%-> 50-55% in 10/2011. MOST RECENT ECHO (03/2012) EF= 66%   Past Surgical History:  Past Surgical History:  Procedure Laterality Date   APPLICATION OF CRANIAL NAVIGATION Right 05/16/2021   Procedure: APPLICATION OF CRANIAL NAVIGATION;  Surgeon: Julio Sicks, MD;  Location: [facility] OR;  Service: Neurosurgery;  Laterality: Right;   ATRIAL ABLATION SURGERY     CARDIAC CATHETERIZATION  08/2011   [facility]   CHOLECYSTECTOMY     CRANIOTOMY Right 05/16/2021   Procedure: Craniotomy - right - Parietal with  brainlab;  Surgeon: Julio Sicks, MD;  Location: [facility] OR;  Service: Neurosurgery;  Laterality: Right;   FINGER SURGERY     LEFT HEART CATHETERIZATION WITH CORONARY ANGIOGRAM N/A 09/12/2011   Procedure: LEFT HEART CATHETERIZATION WITH CORONARY ANGIOGRAM;  Surgeon: Herby Abraham, MD;  Location: 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 Occas tea   Social Determinants of Health   Financial Resource Strain: Not on  file  Food Insecurity: Not on file  Transportation Needs: Not on file  Physical Activity: Not on file  Stress: Not on file  Social Connections: Not on file  Intimate Partner Violence: Not on file   Family History:  Family History  Problem Relation Age of Onset   Healthy Mother        age 98   Melanoma Mother    Melanoma Father    Atrial fibrillation Father        age 26   Diabetes Maternal Grandmother    Coronary artery disease Paternal Grandmother    Heart disease Paternal Grandmother    Heart attack  Paternal Grandmother    Coronary artery disease Paternal Grandfather    Heart disease Paternal Grandfather    Heart attack Paternal Grandfather    Atrial fibrillation Paternal Aunt    Atrial fibrillation Paternal Uncle    Colon cancer Neg Hx    Esophageal cancer Neg Hx    Ovarian cancer Neg Hx    Stomach cancer Neg Hx    Rectal cancer Neg Hx     Review of Systems: Constitutional: Doesn't report fevers, chills or abnormal weight loss Eyes: Doesn't report blurriness of vision Ears, nose, mouth, throat, and face: Doesn't report sore throat Respiratory: Doesn't report cough, dyspnea or wheezes Cardiovascular: Doesn't report palpitation, chest discomfort  Gastrointestinal:  Doesn't report nausea, constipation, diarrhea GU: Doesn't report incontinence Skin: Doesn't report skin rashes Neurological: Per HPI Musculoskeletal: Doesn't report joint pain Behavioral/Psych: Doesn't report anxiety  Physical Exam: Vitals:   07/03/22 1122  BP: 124/79  Pulse: (!) 50  Resp: 20  Temp: 97.7 F (36.5 C)  SpO2: 100%    KPS: 80. General: Alert, cooperative, pleasant, in no acute distress Head: Normal EENT: No conjunctival injection or scleral icterus.  Lungs: Resp effort normal Cardiac: Regular rate Abdomen: Non-distended abdomen Skin: No rashes cyanosis or petechiae. Extremities: No clubbing or edema  Neurologic Exam: Mental Status: Awake, alert, attentive to examiner.  Oriented to self and environment. Language is fluent with intact comprehension.  Cranial Nerves: Visual acuity is grossly normal. Visual fields are full. Extra-ocular movements intact. No ptosis. Face is symmetric Motor: Tone and bulk are normal. Power is 4/5 in left leg, subtle drift in left arm. Reflexes are symmetric, no pathologic reflexes present.  Sensory: Intact to light touch Gait: Hemiparetic, cane assisted.   Labs: I have reviewed the data as listed    Component Value Date/Time   NA 140 06/26/2022 1153   NA 140 03/29/2017 1045   K 4.2 06/26/2022 1153   CL 108 06/26/2022 1153   CO2 24 06/26/2022 1153   GLUCOSE 104 (H) 06/26/2022 1153   BUN 24 (H) 06/26/2022 1153   BUN 18 03/29/2017 1045   CREATININE 1.28 (H) 06/26/2022 1153   CALCIUM 9.9 06/26/2022 1153   PROT 6.3 (L) 06/26/2022 1153   PROT 7.7 03/29/2017 1045   ALBUMIN 4.1 06/26/2022 1153   ALBUMIN 5.2 03/29/2017 1045   AST 16 06/26/2022 1153   ALT 17 06/26/2022 1153   ALKPHOS 36 (L) 06/26/2022 1153   BILITOT 0.8 06/26/2022 1153   GFRNONAA >60 06/26/2022 1153   GFRAA 56 (L) 03/29/2017 1045   Lab Results  Component Value Date   WBC 7.3 07/03/2022   NEUTROABS 5.8 07/03/2022   HGB 14.5 07/03/2022   HCT 42.6 07/03/2022   MCV 105.2 (H) 07/03/2022   PLT 217 07/03/2022    Focal seizures (HCC)  High grade glioma not classifiable by WHO criteria [facility]) - Plan: Clinic Appointment Request  TRACYN BORGHESE is clinically stable today, here for initiation of CPT-11/avastin.  Case was reviewed again in CNS tumor board this morning.   We recommended initiating treatment with cycle #1, day 1 Irinotecan 125 mg/m2 IV q2 weeks, with concurrent Avastin 5mg /kg. The patient will have a complete blood count and CMP performed on days 14 and 28 of each cycle, and a urine protein performed on day 28 of each cycle. Labs may need to be performed more often. Zofran will prescribed for home use for nausea/vomiting.  We reviewed side  effects of Irinotecan, including diarrhea.  Imodium will  prescribed for home use.    Informed consent was obtained verbally to proceed with chemotherapy.  Chemotherapy should be held for the following:  ANC less than 1,000  Platelets less than 100,000  LFT or creatinine greater than 2x ULN  If clinical concerns/contraindications develop  Prednisone will continue 20mg  daily.   Keppra will remain at 1000mg  BID.  We ask that Thalia Party return to clinic in 2 weeks for cycle 1, day 15, or sooner as needed.  All questions were answered. The patient knows to call the clinic with any problems, questions or concerns. No barriers to learning were detected.  The total time spent in the encounter was 30 minutes and more than 50% was on counseling and review of test results   Henreitta Leber, MD Medical Director of Neuro-Oncology [facility] at [facility] 07/03/22 11:33 AM

## 2022-07-03 NOTE — Patient Instructions (Signed)
[facility]  Discharge Instructions: Thank you for choosing [facility] to provide your oncology and hematology care.   If you have a lab appointment with the Cancer Center, please go directly to the Cancer Center and check in at the registration area.   Wear comfortable clothing and clothing appropriate for easy access to any Portacath or PICC line.   We strive to give you quality time with your provider. You may need to reschedule your appointment if you arrive late (15 or more minutes).  Arriving late affects you and other patients whose appointments are after yours.  Also, if you miss three or more appointments without notifying the office, you may be dismissed from the clinic at the provider's discretion.      For prescription refill requests, have your pharmacy contact our office and allow 72 hours for refills to be completed.    Today you received the following chemotherapy and/or immunotherapy agents: Bevacizumab, Irinotecan      To help prevent nausea and vomiting after your treatment, we encourage you to take your nausea medication as directed.  BELOW ARE SYMPTOMS THAT SHOULD BE REPORTED IMMEDIATELY: *FEVER GREATER THAN 100.4 F (38 C) OR HIGHER *CHILLS OR SWEATING *NAUSEA AND VOMITING THAT IS NOT CONTROLLED WITH YOUR NAUSEA MEDICATION *UNUSUAL SHORTNESS OF BREATH *UNUSUAL BRUISING OR BLEEDING *URINARY PROBLEMS (pain or burning when urinating, or frequent urination) *BOWEL PROBLEMS (unusual diarrhea, constipation, pain near the anus) TENDERNESS IN MOUTH AND THROAT WITH OR WITHOUT PRESENCE OF ULCERS (sore throat, sores in mouth, or a toothache) UNUSUAL RASH, SWELLING OR PAIN  UNUSUAL VAGINAL DISCHARGE OR ITCHING   Items with * indicate a potential emergency and should be followed up as soon as possible or go to the Emergency Department if any problems should occur.  Please show the CHEMOTHERAPY ALERT CARD or IMMUNOTHERAPY ALERT  CARD at check-in to the Emergency Department and triage nurse.  Should you have questions after your visit or need to cancel or reschedule your appointment, please contact [facility]  Dept: [phone]  and follow the prompts.  Office hours are 8:00 a.m. to 4:30 p.m. Monday - Friday. Please note that voicemails left after 4:00 p.m. may not be returned until the following business day.  We are closed weekends and major holidays. You have access to a nurse at all times for urgent questions. Please call the main number to the clinic Dept: [phone] and follow the prompts.   For any non-urgent questions, you may also contact your provider using MyChart. We now offer e-Visits for anyone 46 and older to request care online for non-urgent symptoms. For details visit [email or web address].   Also download the MyChart app! Go to the app store, search "MyChart", open the app, select [facility], and log in with your MyChart username and password.  Irinotecan Injection What is this medication? IRINOTECAN (ir in oh TEE kan) treats some types of cancer. It works by slowing down the growth of cancer cells. This medicine may be used for other purposes; ask your health care provider or pharmacist if you have questions. COMMON BRAND NAME(S): Camptosar What should I tell my care team before I take this medication? They need to know if you have any of these conditions: Dehydration Diarrhea Infection, especially a viral infection, such as chickenpox, cold sores, herpes Liver disease Low blood cell levels (white cells, red cells, and platelets) Low levels of electrolytes, such as calcium,  magnesium, or potassium in your blood Recent or ongoing radiation An unusual or allergic reaction to irinotecan, other medications, foods, dyes, or preservatives If you or your partner are pregnant or trying to get pregnant Breast-feeding How should I use this medication? This  medication is injected into a vein. It is given by your care team in a hospital or clinic setting. Talk to your care team about the use of this medication in children. Special care may be needed. Overdosage: If you think you have taken too much of this medicine contact a poison control center or emergency room at once. NOTE: This medicine is only for you. Do not share this medicine with others. What if I miss a dose? Keep appointments for follow-up doses. It is important not to miss your dose. Call your care team if you are unable to keep an appointment. What may interact with this medication? Do not take this medication with any of the following: Cobicistat Itraconazole This medication may also interact with the following: Certain antibiotics, such as clarithromycin, rifampin, rifabutin Certain antivirals for HIV or AIDS Certain medications for fungal infections, such as ketoconazole, posaconazole, voriconazole Certain medications for seizures, such as carbamazepine, phenobarbital, phenytoin Gemfibrozil Nefazodone St. John's wort This list may not describe all possible interactions. Give your health care provider a list of all the medicines, herbs, non-prescription drugs, or dietary supplements you use. Also tell them if you smoke, drink alcohol, or use illegal drugs. Some items may interact with your medicine. What should I watch for while using this medication? Your condition will be monitored carefully while you are receiving this medication. You may need blood work while taking this medication. This medication may make you feel generally unwell. This is not uncommon as chemotherapy can affect healthy cells as well as cancer cells. Report any side effects. Continue your course of treatment even though you feel ill unless your care team tells you to stop. This medication can cause serious side effects. To reduce the risk, your care team may give you other medications to take before receiving  this one. Be sure to follow the directions from your care team. This medication may affect your coordination, reaction time, or judgement. Do not drive or operate machinery until you know how this medication affects you. Sit up or stand slowly to reduce the risk of dizzy or fainting spells. Drinking alcohol with this medication can increase the risk of these side effects. This medication may increase your risk of getting an infection. Call your care team for advice if you get a fever, chills, sore throat, or other symptoms of a cold or flu. Do not treat yourself. Try to avoid being around people who are sick. Avoid taking medications that contain aspirin, acetaminophen, ibuprofen, naproxen, or ketoprofen unless instructed by your care team. These medications may hide a fever. This medication may increase your risk to bruise or bleed. Call your care team if you notice any unusual bleeding. Be careful brushing or flossing your teeth or using a toothpick because you may get an infection or bleed more easily. If you have any dental work done, tell your dentist you are receiving this medication. Talk to your care team if you or your partner are pregnant or think either of you might be pregnant. This medication can cause serious birth defects if taken during pregnancy and for 6 months after the last dose. You will need a negative pregnancy test before starting this medication. Contraception is recommended while taking this medication  and for 6 months after the last dose. Your care team can help you find the option that works for you. Do not father a child while taking this medication and for 3 months after the last dose. Use a condom for contraception during this time period. Do not breastfeed while taking this medication and for 7 days after the last dose. This medication may cause infertility. Talk to your care team if you are concerned about your fertility. What side effects may I notice from receiving this  medication? Side effects that you should report to your care team as soon as possible: Allergic reactions--skin rash, itching, hives, swelling of the face, lips, tongue, or throat Dry cough, shortness of breath or trouble breathing Increased saliva or tears, increased sweating, stomach cramping, diarrhea, small pupils, unusual weakness or fatigue, slow heartbeat Infection--fever, chills, cough, sore throat, wounds that don't heal, pain or trouble when passing urine, general feeling of discomfort or being unwell Kidney injury--decrease in the amount of urine, swelling of the ankles, hands, or feet Low red blood cell level--unusual weakness or fatigue, dizziness, headache, trouble breathing Severe or prolonged diarrhea Unusual bruising or bleeding Side effects that usually do not require medical attention (report to your care team if they continue or are bothersome): Constipation Diarrhea Hair loss Loss of appetite Nausea Stomach pain This list may not describe all possible side effects. Call your doctor for medical advice about side effects. You may report side effects to FDA at 1-800-FDA-1088. Where should I keep my medication? This medication is given in a hospital or clinic. It will not be stored at home. NOTE: This sheet is a summary. It may not cover all possible information. If you have questions about this medicine, talk to your doctor, pharmacist, or health care provider.  2023 Elsevier/Gold Standard (2021-06-23 00:00:00)

## 2022-07-05 ENCOUNTER — Other Ambulatory Visit: Payer: Self-pay

## 2022-07-07 ENCOUNTER — Other Ambulatory Visit: Payer: Self-pay

## 2022-07-07 ENCOUNTER — Telehealth: Payer: Self-pay | Admitting: Internal Medicine

## 2022-07-07 NOTE — Telephone Encounter (Signed)
Scheduled per 05/06 los, patient has been called and voicemail was left. 

## 2022-07-09 ENCOUNTER — Other Ambulatory Visit: Payer: Self-pay

## 2022-07-10 ENCOUNTER — Other Ambulatory Visit (HOSPITAL_COMMUNITY): Payer: Self-pay

## 2022-07-10 MED ORDER — RIVAROXABAN 20 MG PO TABS
20.0000 mg | ORAL_TABLET | Freq: Every day | ORAL | 1 refills | Status: DC
  Filled 2022-07-10: qty 90, 90d supply, fill #0

## 2022-07-17 MED FILL — Dexamethasone Sodium Phosphate Inj 100 MG/10ML: INTRAMUSCULAR | Qty: 1 | Status: AC

## 2022-07-18 ENCOUNTER — Inpatient Hospital Stay (HOSPITAL_BASED_OUTPATIENT_CLINIC_OR_DEPARTMENT_OTHER): Payer: Commercial Managed Care - HMO | Admitting: Internal Medicine

## 2022-07-18 ENCOUNTER — Inpatient Hospital Stay: Payer: Commercial Managed Care - HMO

## 2022-07-18 VITALS — BP 115/76 | HR 56 | Temp 98.3°F | Resp 18 | Ht 72.0 in | Wt 201.6 lb

## 2022-07-18 DIAGNOSIS — C719 Malignant neoplasm of brain, unspecified: Secondary | ICD-10-CM | POA: Diagnosis not present

## 2022-07-18 DIAGNOSIS — Z5112 Encounter for antineoplastic immunotherapy: Secondary | ICD-10-CM | POA: Diagnosis not present

## 2022-07-18 LAB — CBC WITH DIFFERENTIAL (CANCER CENTER ONLY)
Abs Immature Granulocytes: 0.1 10*3/uL — ABNORMAL HIGH (ref 0.00–0.07)
Basophils Absolute: 0 10*3/uL (ref 0.0–0.1)
Basophils Relative: 0 %
Eosinophils Absolute: 0 10*3/uL (ref 0.0–0.5)
Eosinophils Relative: 0 %
HCT: 41.5 % (ref 39.0–52.0)
Hemoglobin: 15 g/dL (ref 13.0–17.0)
Immature Granulocytes: 1 %
Lymphocytes Relative: 6 %
Lymphs Abs: 0.7 10*3/uL (ref 0.7–4.0)
MCH: 37.4 pg — ABNORMAL HIGH (ref 26.0–34.0)
MCHC: 36.1 g/dL — ABNORMAL HIGH (ref 30.0–36.0)
MCV: 103.5 fL — ABNORMAL HIGH (ref 80.0–100.0)
Monocytes Absolute: 0.6 10*3/uL (ref 0.1–1.0)
Monocytes Relative: 5 %
Neutro Abs: 9.2 10*3/uL — ABNORMAL HIGH (ref 1.7–7.7)
Neutrophils Relative %: 88 %
Platelet Count: 234 10*3/uL (ref 150–400)
RBC: 4.01 MIL/uL — ABNORMAL LOW (ref 4.22–5.81)
RDW: 14.5 % (ref 11.5–15.5)
WBC Count: 10.5 10*3/uL (ref 4.0–10.5)
nRBC: 0 % (ref 0.0–0.2)

## 2022-07-18 LAB — CMP (CANCER CENTER ONLY)
ALT: 13 U/L (ref 0–44)
AST: 11 U/L — ABNORMAL LOW (ref 15–41)
Albumin: 4.1 g/dL (ref 3.5–5.0)
Alkaline Phosphatase: 38 U/L (ref 38–126)
Anion gap: 7 (ref 5–15)
BUN: 21 mg/dL — ABNORMAL HIGH (ref 6–20)
CO2: 28 mmol/L (ref 22–32)
Calcium: 9.5 mg/dL (ref 8.9–10.3)
Chloride: 105 mmol/L (ref 98–111)
Creatinine: 1.23 mg/dL (ref 0.61–1.24)
GFR, Estimated: >60 mL/min (ref >60)
Glucose, Bld: 89 mg/dL (ref 70–99)
Potassium: 3.8 mmol/L (ref 3.5–5.1)
Sodium: 140 mmol/L (ref 135–145)
Total Bilirubin: 0.6 mg/dL (ref 0.3–1.2)
Total Protein: 6.2 g/dL — ABNORMAL LOW (ref 6.5–8.1)

## 2022-07-18 MED ORDER — SODIUM CHLORIDE 0.9 % IV SOLN
5.0000 mg/kg | Freq: Once | INTRAVENOUS | Status: AC
  Administered 2022-07-18: 500 mg via INTRAVENOUS
  Filled 2022-07-18: qty 4

## 2022-07-18 MED ORDER — SODIUM CHLORIDE 0.9 % IV SOLN
10.0000 mg | Freq: Once | INTRAVENOUS | Status: AC
  Administered 2022-07-18: 10 mg via INTRAVENOUS
  Filled 2022-07-18: qty 10

## 2022-07-18 MED ORDER — IRINOTECAN HCL CHEMO INJECTION 100 MG/5ML
125.0000 mg/m2 | Freq: Once | INTRAVENOUS | Status: AC
  Administered 2022-07-18: 300 mg via INTRAVENOUS
  Filled 2022-07-18: qty 15

## 2022-07-18 MED ORDER — PALONOSETRON HCL INJECTION 0.25 MG/5ML
0.2500 mg | Freq: Once | INTRAVENOUS | Status: AC
  Administered 2022-07-18: 0.25 mg via INTRAVENOUS
  Filled 2022-07-18: qty 5

## 2022-07-18 MED ORDER — PREDNISONE 20 MG PO TABS: 20.0000 mg | ORAL_TABLET | Freq: Every day | ORAL | 1 refills | Status: DC

## 2022-07-18 MED ORDER — SODIUM CHLORIDE 0.9 % IV SOLN: Freq: Once | INTRAVENOUS | Status: AC

## 2022-07-18 NOTE — Progress Notes (Signed)
[facility] [address]  [place], [place] [address] [phone]   Interval Evaluation  Date of Service: 07/18/22 Patient Name: Jerry Richards Patient MRN: [id number] Patient DOB: 21-Jul-1970 Provider: Henreitta Leber, MD  Identifying Statement:  Jerry Richards CANCEL is a 52 y.o. male with right frontal glioblastoma   Oncologic History: Oncology History  High grade glioma not classifiable by WHO criteria (HCC)  05/16/2021 Surgery   Craniotomy, R parafalcine resection with Dr. Jordan Likes; path is high grade glioma IDHwt   06/20/2021 - 06/20/2021 Chemotherapy   Patient is on Treatment Plan : BRAIN GLIOBLASTOMA Radiation Therapy With Concurrent Temozolomide 75 mg/m2 Daily Followed By Sequential Maintenance Temozolomide x 6-12 cycles     09/05/2021 - 12/17/2021 Chemotherapy   Completes 3 cycles of adjuvant 5-day Temodar   12/18/2021 Progression   Progression of disease #1   12/29/2021 -  Chemotherapy   Initiates second line therapy with CCNU 90mg /m2 q6 weeks, and Avastin 10mg /kg IV q2 weeks   06/12/2022 - 06/12/2022 Chemotherapy   Patient is on Treatment Plan : BRAIN GBM Bevacizumab 14d x 6 cycles     07/03/2022 -  Chemotherapy   Patient is on Treatment Plan : BRAIN GBM [facility] Irinotecan D1,15 + Bevacizumab D1,15 q 28d     Glioblastoma multiforme of frontal lobe (HCC)  09/12/2021 Initial Diagnosis   Glioblastoma multiforme of frontal lobe (HCC)   12/29/2021 - 05/11/2022 Chemotherapy   Patient is on Treatment Plan : BRAIN GBM Bevacizumab q14d       Biomarkers:  MGMT Unknown.  IDH 1/2 Wild type.  EGFR Unknown  TERT Unknown   Interval History: Jerry Richards presents today for Irinotecan and Avastin.  Diarrhea has been manageable without needing immodium.  Shoulder pain remains stable, prednisone currently at 40mg  daily.  Dose had increased following last infusion.  No other new or progressive symptoms.  Denies any recurrence of seizures, no  headaches.  Leg symptoms are stable.    H+P (06/07/21): Patient presented to neurologic attention in March with several days of progressive left sided weakness and seizures, characterized by twitching of the left leg.  CNS imaging demonstrated a right frontal mass.  He underwent craniotomy and resection with Dr. Jordan Likes on 05/16/21; path demonstrated high grade glioma IDHwt.  Following surgery his left leg was much weaker, though it has improved with aggressive rehab efforts.  Currently he is using a cane to ambulate.  He has no other complaints, continues on decadron 2mg  twice per day.  No seizures since surgery.  Medications: Current Outpatient Medications on File Prior to Visit  Medication Sig Dispense Refill   acetaminophen (TYLENOL) 500 MG tablet Take 1,000 mg by mouth every 6 (six) hours as needed for moderate pain.     albuterol (VENTOLIN HFA) 108 (90 Base) MCG/ACT inhaler Inhale 1 puff into the lungs every 4 (four) hours as needed.     levETIRAcetam (KEPPRA) 500 MG tablet TAKE 2 TABLETS(1000 MG) BY MOUTH TWICE DAILY 360 tablet 0   losartan (COZAAR) 50 MG tablet Take 50 mg by mouth daily.  6   mirtazapine (REMERON) 15 MG tablet Take 15 mg by mouth at bedtime.     predniSONE (DELTASONE) 20 MG tablet Take 1 tablet (20 mg total) by mouth daily with breakfast. 30 tablet 1   rivaroxaban (XARELTO) 20 MG TABS tablet Take 1 tablet (20 mg total) by mouth daily. 90 tablet 1   No current facility-administered medications on file  prior to visit.    Allergies:  Allergies  Allergen Reactions   Amiodarone     AMIODARONE ANALOGUES Hyperthyroidism   Morphine And Codeine Itching and Palpitations    Chest pressure/palpitations   Past Medical History:  Past Medical History:  Diagnosis Date   Anxiety    Atrial fibrillation (HCC)    a. 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 MOST RECENT ECHO (03/2012) EF= 66%   Past Surgical History:  Past Surgical History:  Procedure Laterality Date   APPLICATION OF CRANIAL NAVIGATION Right 05/16/2021   Procedure: APPLICATION OF CRANIAL NAVIGATION;  Surgeon: Julio Sicks, MD;  Location: [facility] OR;  Service: Neurosurgery;  Laterality: Right;   ATRIAL ABLATION SURGERY     CARDIAC CATHETERIZATION  08/2011   [facility]   CHOLECYSTECTOMY     CRANIOTOMY Right 05/16/2021   Procedure: Craniotomy - right - Parietal with  brainlab;  Surgeon: Julio Sicks, MD;  Location[facility];  Service: Neurosurgery;  Laterality: Right;   FINGER SURGERY     LEFT HEART CATHETERIZATION WITH CORONARY ANGIOGRAM N/A 09/12/2011   Procedure: LEFT HEART CATHETERIZATION WITH CORONARY ANGIOGRAM;  Surgeon: Herby Abraham, MD;  Location: [facility];  Service: Cardiovascular;  Laterality: N/A;   VASECTOMY     Social History:  Social History   Socioeconomic History   Marital status: Married    Spouse name: tanya   Number of children: 2   Years of education: Not on file   Highest education level: Some college, no degree  Occupational History   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 Occas tea    Social Determinants of Health   Financial Resource Strain: Not on file  Food Insecurity: Not on file  Transportation Needs: Not on file  Physical Activity: Not on file  Stress: Not on file  Social Connections: Not on file  Intimate Partner Violence: Not on file   Family History:  Family History  Problem Relation Age of Onset   Healthy Mother        age 14   Melanoma Mother    Melanoma Father    Atrial fibrillation Father        age 56   Diabetes Maternal Grandmother    Coronary artery disease Paternal Grandmother    Heart  disease Paternal Grandmother    Heart attack Paternal Grandmother    Coronary artery disease Paternal Grandfather    Heart disease Paternal Grandfather    Heart attack Paternal Grandfather    Atrial fibrillation Paternal Aunt    Atrial fibrillation Paternal Uncle    Colon cancer Neg Hx    Esophageal cancer Neg Hx    Ovarian cancer Neg Hx    Stomach cancer Neg Hx    Rectal cancer Neg Hx     Review of Systems: Constitutional: Doesn't report fevers, chills or abnormal weight loss Eyes: Doesn't report blurriness of vision Ears, nose, mouth, throat, and face: Doesn't report sore throat Respiratory: Doesn't report cough, dyspnea or wheezes Cardiovascular: Doesn't report palpitation, chest discomfort  Gastrointestinal:  Doesn't report nausea, constipation, diarrhea GU: Doesn't report incontinence Skin: Doesn't report skin rashes Neurological: Per HPI Musculoskeletal: Doesn't report joint pain Behavioral/Psych: Doesn't report anxiety  Physical Exam: There were no vitals filed for this visit.   KPS: 80. General: Alert, cooperative, pleasant, in no acute distress Head: Normal EENT: No conjunctival injection or scleral icterus.  Lungs: Resp effort normal Cardiac: Regular rate Abdomen: Non-distended abdomen Skin: No rashes cyanosis or petechiae. Extremities: No clubbing or edema  Neurologic Exam: Mental Status: Awake, alert, attentive to  examiner. Oriented to self and environment. Language is fluent with intact comprehension.  Cranial Nerves: Visual acuity is grossly normal. Visual fields are full. Extra-ocular movements intact. No ptosis. Face is symmetric Motor: Tone and bulk are normal. Power is 4/5 in left leg, subtle drift in left arm. Reflexes are symmetric, no pathologic reflexes present.  Sensory: Intact to light touch Gait: Hemiparetic, cane assisted.   Labs: I have reviewed the data as listed    Component Value Date/Time   NA 140 07/03/2022 1031   NA 140 03/29/2017 1045   K 3.8 07/03/2022 1031   CL 106 07/03/2022 1031   CO2 27 07/03/2022 1031   GLUCOSE 95 07/03/2022 1031   BUN 21 (H) 07/03/2022 1031   BUN 18 03/29/2017 1045   CREATININE 1.31 (H) 07/03/2022 1031   CALCIUM 9.2 07/03/2022 1031   PROT 6.3 (L) 07/03/2022 1031   PROT 7.7 03/29/2017 1045   ALBUMIN 4.1 07/03/2022 1031   ALBUMIN 5.2 03/29/2017 1045   AST 13 (L) 07/03/2022 1031   ALT 19 07/03/2022 1031   ALKPHOS 39 07/03/2022 1031   BILITOT 0.6 07/03/2022 1031   GFRNONAA >60 07/03/2022 1031   GFRAA 56 (L) 03/29/2017 1045   Lab Results  Component Value Date   WBC 7.3 07/03/2022   NEUTROABS 5.8 07/03/2022   HGB 14.5 07/03/2022   HCT 42.6 07/03/2022   MCV 105.2 (H) 07/03/2022   PLT 217 07/03/2022    High grade glioma not classifiable by WHO criteria [facility]) - Plan: Clinic Appointment Request  SAIVON HOOBLER is clinically stable today, here for cycle #1, day 15 of CPT-11/avastin.    We recommended continuing treatment with cycle #1, day 15 Irinotecan 125 mg/m2 IV q2 weeks, with concurrent Avastin 5mg /kg. The patient will have a complete blood count and CMP performed on days 14 and 28 of each cycle, and a urine protein performed on day 28 of each cycle. Labs may need to be performed more often. Zofran will prescribed for home use for nausea/vomiting.  We reviewed side effects of Irinotecan, including diarrhea.  Imodium will prescribed for  home use.    Informed consent was obtained verbally to proceed with chemotherapy.  Chemotherapy should be held  for the following:  ANC less than 1,000  Platelets less than 100,000  LFT or creatinine greater than 2x ULN  If clinical concerns/contraindications develop  Prednisone will continue 40mg  daily.   Keppra will remain at 1000mg  BID.  We ask that Thalia Party return to clinic in 2 weeks for cycle 2, day 1, or sooner as needed.  MRI will be scheduled for 08/24/22  All questions were answered. The patient knows to call the clinic with any problems, questions or concerns. No barriers to learning were detected.  The total time spent in the encounter was 30 minutes and more than 50% was on counseling and review of test results   Henreitta Leber, MD Medical Director of Neuro-Oncology [facility] at [place] 07/18/22 10:55 AM

## 2022-07-18 NOTE — Patient Instructions (Signed)
[facility]  Discharge Instructions: Thank you for choosing [facility] to provide your oncology and hematology care.   If you have a lab appointment with the Cancer Center, please go directly to the Cancer Center and check in at the registration area.   Wear comfortable clothing and clothing appropriate for easy access to any Portacath or PICC line.   We strive to give you quality time with your provider. You may need to reschedule your appointment if you arrive late (15 or more minutes).  Arriving late affects you and other patients whose appointments are after yours.  Also, if you miss three or more appointments without notifying the office, you may be dismissed from the clinic at the provider's discretion.      For prescription refill requests, have your pharmacy contact our office and allow 72 hours for refills to be completed.    Today you received the following chemotherapy and/or immunotherapy agents: Bevacizumab, Irinotecan      To help prevent nausea and vomiting after your treatment, we encourage you to take your nausea medication as directed.  BELOW ARE SYMPTOMS THAT SHOULD BE REPORTED IMMEDIATELY: *FEVER GREATER THAN 100.4 F (38 C) OR HIGHER *CHILLS OR SWEATING *NAUSEA AND VOMITING THAT IS NOT CONTROLLED WITH YOUR NAUSEA MEDICATION *UNUSUAL SHORTNESS OF BREATH *UNUSUAL BRUISING OR BLEEDING *URINARY PROBLEMS (pain or burning when urinating, or frequent urination) *BOWEL PROBLEMS (unusual diarrhea, constipation, pain near the anus) TENDERNESS IN MOUTH AND THROAT WITH OR WITHOUT PRESENCE OF ULCERS (sore throat, sores in mouth, or a toothache) UNUSUAL RASH, SWELLING OR PAIN  UNUSUAL VAGINAL DISCHARGE OR ITCHING   Items with * indicate a potential emergency and should be followed up as soon as possible or go to the Emergency Department if any problems should occur.  Please show the CHEMOTHERAPY ALERT CARD or IMMUNOTHERAPY ALERT  CARD at check-in to the Emergency Department and triage nurse.  Should you have questions after your visit or need to cancel or reschedule your appointment, please contact [facility]  Dept: [phone]  and follow the prompts.  Office hours are 8:00 a.m. to 4:30 p.m. Monday - Friday. Please note that voicemails left after 4:00 p.m. may not be returned until the following business day.  We are closed weekends and major holidays. You have access to a nurse at all times for urgent questions. Please call the main number to the clinic Dept: [phone] and follow the prompts.   For any non-urgent questions, you may also contact your provider using MyChart. We now offer e-Visits for anyone 18 and older to request care online for non-urgent symptoms. For details visit [email or web address].   Also download the MyChart app! Go to the app store, search "MyChart", open the app, select , and log in with your MyChart username and password.  Irinotecan Injection What is this medication? IRINOTECAN (ir in oh TEE kan) treats some types of cancer. It works by slowing down the growth of cancer cells. This medicine may be used for other purposes; ask your health care provider or pharmacist if you have questions. COMMON BRAND NAME(S): Camptosar What should I tell my care team before I take this medication? They need to know if you have any of these conditions: Dehydration Diarrhea Infection, especially a viral infection, such as chickenpox, cold sores, herpes Liver disease Low blood cell levels (white cells, red cells, and platelets) Low levels of electrolytes, such as calcium,   magnesium, or potassium in your blood Recent or ongoing radiation An unusual or allergic reaction to irinotecan, other medications, foods, dyes, or preservatives If you or your partner are pregnant or trying to get pregnant Breast-feeding How should I use this medication? This  medication is injected into a vein. It is given by your care team in a hospital or clinic setting. Talk to your care team about the use of this medication in children. Special care may be needed. Overdosage: If you think you have taken too much of this medicine contact a poison control center or emergency room at once. NOTE: This medicine is only for you. Do not share this medicine with others. What if I miss a dose? Keep appointments for follow-up doses. It is important not to miss your dose. Call your care team if you are unable to keep an appointment. What may interact with this medication? Do not take this medication with any of the following: Cobicistat Itraconazole This medication may also interact with the following: Certain antibiotics, such as clarithromycin, rifampin, rifabutin Certain antivirals for HIV or AIDS Certain medications for fungal infections, such as ketoconazole, posaconazole, voriconazole Certain medications for seizures, such as carbamazepine, phenobarbital, phenytoin Gemfibrozil Nefazodone St. John's wort This list may not describe all possible interactions. Give your health care provider a list of all the medicines, herbs, non-prescription drugs, or dietary supplements you use. Also tell them if you smoke, drink alcohol, or use illegal drugs. Some items may interact with your medicine. What should I watch for while using this medication? Your condition will be monitored carefully while you are receiving this medication. You may need blood work while taking this medication. This medication may make you feel generally unwell. This is not uncommon as chemotherapy can affect healthy cells as well as cancer cells. Report any side effects. Continue your course of treatment even though you feel ill unless your care team tells you to stop. This medication can cause serious side effects. To reduce the risk, your care team may give you other medications to take before receiving  this one. Be sure to follow the directions from your care team. This medication may affect your coordination, reaction time, or judgement. Do not drive or operate machinery until you know how this medication affects you. Sit up or stand slowly to reduce the risk of dizzy or fainting spells. Drinking alcohol with this medication can increase the risk of these side effects. This medication may increase your risk of getting an infection. Call your care team for advice if you get a fever, chills, sore throat, or other symptoms of a cold or flu. Do not treat yourself. Try to avoid being around people who are sick. Avoid taking medications that contain aspirin, acetaminophen, ibuprofen, naproxen, or ketoprofen unless instructed by your care team. These medications may hide a fever. This medication may increase your risk to bruise or bleed. Call your care team if you notice any unusual bleeding. Be careful brushing or flossing your teeth or using a toothpick because you may get an infection or bleed more easily. If you have any dental work done, tell your dentist you are receiving this medication. Talk to your care team if you or your partner are pregnant or think either of you might be pregnant. This medication can cause serious birth defects if taken during pregnancy and for 6 months after the last dose. You will need a negative pregnancy test before starting this medication. Contraception is recommended while taking this medication   and for 6 months after the last dose. Your care team can help you find the option that works for you. Do not father a child while taking this medication and for 3 months after the last dose. Use a condom for contraception during this time period. Do not breastfeed while taking this medication and for 7 days after the last dose. This medication may cause infertility. Talk to your care team if you are concerned about your fertility. What side effects may I notice from receiving this  medication? Side effects that you should report to your care team as soon as possible: Allergic reactions--skin rash, itching, hives, swelling of the face, lips, tongue, or throat Dry cough, shortness of breath or trouble breathing Increased saliva or tears, increased sweating, stomach cramping, diarrhea, small pupils, unusual weakness or fatigue, slow heartbeat Infection--fever, chills, cough, sore throat, wounds that don't heal, pain or trouble when passing urine, general feeling of discomfort or being unwell Kidney injury--decrease in the amount of urine, swelling of the ankles, hands, or feet Low red blood cell level--unusual weakness or fatigue, dizziness, headache, trouble breathing Severe or prolonged diarrhea Unusual bruising or bleeding Side effects that usually do not require medical attention (report to your care team if they continue or are bothersome): Constipation Diarrhea Hair loss Loss of appetite Nausea Stomach pain This list may not describe all possible side effects. Call your doctor for medical advice about side effects. You may report side effects to FDA at 1-800-FDA-1088. Where should I keep my medication? This medication is given in a hospital or clinic. It will not be stored at home. NOTE: This sheet is a summary. It may not cover all possible information. If you have questions about this medicine, talk to your doctor, pharmacist, or health care provider.  2023 Elsevier/Gold Standard (2021-06-23 00:00:00)  

## 2022-07-18 NOTE — Progress Notes (Signed)
Per Dr. Barbaraann Cao, ok to keep irinotecan dose at 300mg  today.   Renaee Munda, PharmD PGY-2 Pharmacy Resident Hematology/Oncology [phone]  07/18/2022 12:36 PM

## 2022-07-18 NOTE — Progress Notes (Signed)
Ok to treat without urine protein today 07/18/2022 per Dr Barbaraann Cao.

## 2022-07-18 NOTE — Progress Notes (Unsigned)
Pt c/o abdominal cramping post Irinotecan that lasted approximately 1 day after previous infusion. Educated patient on Atropine to reduce abdominal/GI side effects but patient stated that the cramping was tolerable and would prefer to keep treatment the same with no additional premedications. Educated patient on Atropine use in the future if symptoms persist/worsen. Pt verbalizes understanding.

## 2022-07-19 ENCOUNTER — Other Ambulatory Visit: Payer: Self-pay

## 2022-07-24 ENCOUNTER — Other Ambulatory Visit: Payer: Self-pay | Admitting: Internal Medicine

## 2022-07-25 ENCOUNTER — Encounter: Payer: Self-pay | Admitting: Internal Medicine

## 2022-07-31 ENCOUNTER — Other Ambulatory Visit: Payer: Self-pay | Admitting: *Deleted

## 2022-07-31 DIAGNOSIS — C719 Malignant neoplasm of brain, unspecified: Secondary | ICD-10-CM

## 2022-08-01 ENCOUNTER — Other Ambulatory Visit (HOSPITAL_COMMUNITY): Payer: Self-pay

## 2022-08-01 ENCOUNTER — Inpatient Hospital Stay: Payer: Commercial Managed Care - HMO

## 2022-08-01 ENCOUNTER — Inpatient Hospital Stay: Payer: Commercial Managed Care - HMO | Attending: Neurosurgery

## 2022-08-01 ENCOUNTER — Inpatient Hospital Stay: Payer: Commercial Managed Care - HMO | Admitting: Internal Medicine

## 2022-08-01 VITALS — BP 116/79 | HR 50 | Temp 98.2°F | Resp 18 | Ht 72.0 in | Wt 202.2 lb

## 2022-08-01 VITALS — BP 123/83 | HR 76 | Resp 17

## 2022-08-01 DIAGNOSIS — M25519 Pain in unspecified shoulder: Secondary | ICD-10-CM | POA: Diagnosis not present

## 2022-08-01 DIAGNOSIS — Z808 Family history of malignant neoplasm of other organs or systems: Secondary | ICD-10-CM | POA: Insufficient documentation

## 2022-08-01 DIAGNOSIS — Z79630 Long term (current) use of alkylating agent: Secondary | ICD-10-CM | POA: Insufficient documentation

## 2022-08-01 DIAGNOSIS — Z7901 Long term (current) use of anticoagulants: Secondary | ICD-10-CM | POA: Insufficient documentation

## 2022-08-01 DIAGNOSIS — C719 Malignant neoplasm of brain, unspecified: Secondary | ICD-10-CM | POA: Diagnosis not present

## 2022-08-01 DIAGNOSIS — Z5111 Encounter for antineoplastic chemotherapy: Secondary | ICD-10-CM | POA: Diagnosis present

## 2022-08-01 DIAGNOSIS — R197 Diarrhea, unspecified: Secondary | ICD-10-CM | POA: Insufficient documentation

## 2022-08-01 DIAGNOSIS — Z885 Allergy status to narcotic agent status: Secondary | ICD-10-CM | POA: Insufficient documentation

## 2022-08-01 DIAGNOSIS — F17210 Nicotine dependence, cigarettes, uncomplicated: Secondary | ICD-10-CM | POA: Diagnosis not present

## 2022-08-01 DIAGNOSIS — Z7952 Long term (current) use of systemic steroids: Secondary | ICD-10-CM | POA: Insufficient documentation

## 2022-08-01 DIAGNOSIS — Z9049 Acquired absence of other specified parts of digestive tract: Secondary | ICD-10-CM | POA: Diagnosis not present

## 2022-08-01 DIAGNOSIS — Z833 Family history of diabetes mellitus: Secondary | ICD-10-CM | POA: Insufficient documentation

## 2022-08-01 DIAGNOSIS — Z8249 Family history of ischemic heart disease and other diseases of the circulatory system: Secondary | ICD-10-CM | POA: Diagnosis not present

## 2022-08-01 DIAGNOSIS — Z79899 Other long term (current) drug therapy: Secondary | ICD-10-CM | POA: Insufficient documentation

## 2022-08-01 DIAGNOSIS — C711 Malignant neoplasm of frontal lobe: Secondary | ICD-10-CM | POA: Diagnosis present

## 2022-08-01 DIAGNOSIS — Z5112 Encounter for antineoplastic immunotherapy: Secondary | ICD-10-CM | POA: Diagnosis present

## 2022-08-01 LAB — CMP (CANCER CENTER ONLY)
ALT: 15 U/L (ref 0–44)
AST: 10 U/L — ABNORMAL LOW (ref 15–41)
Albumin: 4 g/dL (ref 3.5–5.0)
Alkaline Phosphatase: 36 U/L — ABNORMAL LOW (ref 38–126)
Anion gap: 6 (ref 5–15)
BUN: 26 mg/dL — ABNORMAL HIGH (ref 6–20)
CO2: 28 mmol/L (ref 22–32)
Calcium: 9.3 mg/dL (ref 8.9–10.3)
Chloride: 107 mmol/L (ref 98–111)
Creatinine: 1.23 mg/dL (ref 0.61–1.24)
GFR, Estimated: >60 mL/min (ref >60)
Glucose, Bld: 90 mg/dL (ref 70–99)
Potassium: 3.7 mmol/L (ref 3.5–5.1)
Sodium: 141 mmol/L (ref 135–145)
Total Bilirubin: 0.5 mg/dL (ref 0.3–1.2)
Total Protein: 6.3 g/dL — ABNORMAL LOW (ref 6.5–8.1)

## 2022-08-01 LAB — CBC WITH DIFFERENTIAL (CANCER CENTER ONLY)
Abs Immature Granulocytes: 0.17 10*3/uL — ABNORMAL HIGH (ref 0.00–0.07)
Basophils Absolute: 0 10*3/uL (ref 0.0–0.1)
Basophils Relative: 0 %
Eosinophils Absolute: 0.1 10*3/uL (ref 0.0–0.5)
Eosinophils Relative: 1 %
HCT: 41.9 % (ref 39.0–52.0)
Hemoglobin: 14.5 g/dL (ref 13.0–17.0)
Immature Granulocytes: 1 %
Lymphocytes Relative: 12 %
Lymphs Abs: 1.5 10*3/uL (ref 0.7–4.0)
MCH: 36.3 pg — ABNORMAL HIGH (ref 26.0–34.0)
MCHC: 34.6 g/dL (ref 30.0–36.0)
MCV: 104.8 fL — ABNORMAL HIGH (ref 80.0–100.0)
Monocytes Absolute: 0.9 10*3/uL (ref 0.1–1.0)
Monocytes Relative: 7 %
Neutro Abs: 10.2 10*3/uL — ABNORMAL HIGH (ref 1.7–7.7)
Neutrophils Relative %: 79 %
Platelet Count: 222 10*3/uL (ref 150–400)
RBC: 4 MIL/uL — ABNORMAL LOW (ref 4.22–5.81)
RDW: 14.6 % (ref 11.5–15.5)
WBC Count: 12.8 10*3/uL — ABNORMAL HIGH (ref 4.0–10.5)
nRBC: 0 % (ref 0.0–0.2)

## 2022-08-01 LAB — TOTAL PROTEIN, URINE DIPSTICK: Protein, ur: NEGATIVE mg/dL

## 2022-08-01 MED ORDER — SODIUM CHLORIDE 0.9 % IV SOLN
10.0000 mg | Freq: Once | INTRAVENOUS | Status: AC
  Administered 2022-08-01: 10 mg via INTRAVENOUS
  Filled 2022-08-01: qty 10

## 2022-08-01 MED ORDER — SODIUM CHLORIDE 0.9 % IV SOLN
5.0000 mg/kg | Freq: Once | INTRAVENOUS | Status: AC
  Administered 2022-08-01: 500 mg via INTRAVENOUS
  Filled 2022-08-01: qty 4

## 2022-08-01 MED ORDER — SODIUM CHLORIDE 0.9 % IV SOLN
125.0000 mg/m2 | Freq: Once | INTRAVENOUS | Status: AC
  Administered 2022-08-01: 300 mg via INTRAVENOUS
  Filled 2022-08-01: qty 15

## 2022-08-01 MED ORDER — SODIUM CHLORIDE 0.9 % IV SOLN: Freq: Once | INTRAVENOUS | Status: AC

## 2022-08-01 MED ORDER — PALONOSETRON HCL INJECTION 0.25 MG/5ML
0.2500 mg | Freq: Once | INTRAVENOUS | Status: AC
  Administered 2022-08-01: 0.25 mg via INTRAVENOUS
  Filled 2022-08-01: qty 5

## 2022-08-01 NOTE — Progress Notes (Signed)
[facility] [address]  [place], [place] [address] [phone]   Interval Evaluation  Date of Service: 08/01/22 Patient Name: Jerry Richards Patient MRN: [id number] Patient DOB: [birth year] Provider: Henreitta Leber, MD  Identifying Statement:  Jerry Richards is a 52 y.o. male with right frontal glioblastoma   Oncologic History: Oncology History  High grade glioma not classifiable by WHO criteria (HCC)  05/16/2021 Surgery   Craniotomy, R parafalcine resection with Dr. Jordan Likes; path is high grade glioma IDHwt   06/20/2021 - 06/20/2021 Chemotherapy   Patient is on Treatment Plan : BRAIN GLIOBLASTOMA Radiation Therapy With Concurrent Temozolomide 75 mg/m2 Daily Followed By Sequential Maintenance Temozolomide x 6-12 cycles     09/05/2021 - 12/17/2021 Chemotherapy   Completes 3 cycles of adjuvant 5-day Temodar   12/18/2021 Progression   Progression of disease #1   12/29/2021 -  Chemotherapy   Initiates second line therapy with CCNU 90mg /m2 q6 weeks, and Avastin 10mg /kg IV q2 weeks   06/12/2022 - 06/12/2022 Chemotherapy   Patient is on Treatment Plan : BRAIN GBM Bevacizumab 14d x 6 cycles     07/03/2022 -  Chemotherapy   Patient is on Treatment Plan : BRAIN GBM [facility] Irinotecan D1,15 + Bevacizumab D1,15 q 28d     Glioblastoma multiforme of frontal lobe (HCC)  09/12/2021 Initial Diagnosis   Glioblastoma multiforme of frontal lobe (HCC)   12/29/2021 - 05/11/2022 Chemotherapy   Patient is on Treatment Plan : BRAIN GBM Bevacizumab q14d       Biomarkers:  MGMT Unknown.  IDH 1/2 Wild type.  EGFR Unknown  TERT Unknown   Interval History: Jerry Richards presents today for Irinotecan and Avastin.  Diarrhea was not a significant problem after the last infusion.  Shoulder pain remains stable, prednisone currently at 40mg  daily.  No other new or progressive symptoms.  Denies any recurrence of seizures, no headaches.  Leg symptoms are stable.     H+P (06/07/21): Patient presented to neurologic attention in March with several days of progressive left sided weakness and seizures, characterized by twitching of the left leg.  CNS imaging demonstrated a right frontal mass.  He underwent craniotomy and resection with Dr. Jordan Likes on 05/16/21; path demonstrated high grade glioma IDHwt.  Following surgery his left leg was much weaker, though it has improved with aggressive rehab efforts.  Currently he is using a cane to ambulate.  He has no other complaints, continues on decadron 2mg  twice per day.  No seizures since surgery.  Medications: Current Outpatient Medications on File Prior to Visit  Medication Sig Dispense Refill   acetaminophen (TYLENOL) 500 MG tablet Take 1,000 mg by mouth every 6 (six) hours as needed for moderate pain.     albuterol (VENTOLIN HFA) 108 (90 Base) MCG/ACT inhaler Inhale 1 puff into the lungs every 4 (four) hours as needed.     levETIRAcetam (KEPPRA) 500 MG tablet TAKE 2 TABS BY MOUTH TWICE DAILY 360 tablet 0   losartan (COZAAR) 50 MG tablet Take 50 mg by mouth daily.  6   mirtazapine (REMERON) 15 MG tablet Take 15 mg by mouth at bedtime.     predniSONE (DELTASONE) 20 MG tablet Take 1 tablet (20 mg total) by mouth daily with breakfast. (Patient taking differently: Take 40 mg by mouth daily with breakfast.) 60 tablet 1   rivaroxaban (XARELTO) 20 MG TABS tablet Take 1 tablet (20 mg total) by mouth daily. 90 tablet 1  No current facility-administered medications on file prior to visit.    Allergies:  Allergies  Allergen Reactions   Amiodarone     AMIODARONE ANALOGUES Hyperthyroidism   Morphine And Codeine Itching and Palpitations    Chest pressure/palpitations   Past Medical History:  Past Medical History:  Diagnosis Date   Anxiety    Atrial fibrillation (HCC)    a. s/p RFCA x 2 (10/26/2011, 04/15/2012)   Atrial flutter (HCC)    Chronic kidney disease    Clotting disorder (HCC)    Colitis    Current smoker     Diverticulitis    Finger near amputation, left    Hyperthyroidism    Secondary to amiodarone   LA thrombus    Non-ischemic cardiomyopathy (HCC)    OSA on CPAP    Sinus trouble    Sleep apnea    Systolic heart failure (HCC)    EF 20-25%-> 50-55% in 10/2011. MOST RECENT ECHO (03/2012) EF= 66%   Past Surgical History:  Past Surgical History:  Procedure Laterality Date   APPLICATION OF CRANIAL NAVIGATION Right 05/16/2021   Procedure: APPLICATION OF CRANIAL NAVIGATION;  Surgeon: Julio Sicks, MD;  Location: [facility] OR;  Service: Neurosurgery;  Laterality: Right;   ATRIAL ABLATION SURGERY     CARDIAC CATHETERIZATION  08/2011   [facility]   CHOLECYSTECTOMY     CRANIOTOMY Right 05/16/2021   Procedure: Craniotomy - right - Parietal with  brainlab;  Surgeon: Julio Sicks, MD;  Location[facility] OR;  Service: Neurosurgery;  Laterality: Right;   FINGER SURGERY     LEFT HEART CATHETERIZATION WITH CORONARY ANGIOGRAM N/A 09/12/2011   Procedure: LEFT HEART CATHETERIZATION WITH CORONARY ANGIOGRAM;  Surgeon: Herby Abraham, MD;  Location: [facility] CATH LAB;  Service: Cardiovascular;  Laterality: N/A;   VASECTOMY     Social History:  Social History   Socioeconomic History   Marital status: Married    Spouse name: tanya   Number of children: 2   Years of education: Not on file   Highest education level: Some college, no degree  Occupational History   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 Occas tea   Social Determinants of Health   Financial Resource Strain: Not on file  Food Insecurity: Not on file  Transportation Needs: Not on file  Physical Activity: Not on file  Stress: Not on file  Social Connections: Not on file  Intimate Partner Violence: Not on file   Family History:  Family History  Problem Relation Age of Onset   Healthy Mother        age 27   Melanoma Mother    Melanoma Father    Atrial fibrillation Father        age 44   Diabetes Maternal Grandmother    Coronary artery disease  Paternal Grandmother    Heart disease Paternal Grandmother    Heart attack Paternal Grandmother    Coronary artery disease Paternal Grandfather    Heart disease Paternal Grandfather    Heart attack Paternal Grandfather    Atrial fibrillation Paternal Aunt    Atrial fibrillation Paternal Uncle    Colon cancer Neg Hx    Esophageal cancer Neg Hx    Ovarian cancer Neg Hx    Stomach cancer Neg Hx    Rectal cancer Neg Hx     Review of Systems: Constitutional: Doesn't report fevers, chills or abnormal weight loss Eyes: Doesn't report blurriness of vision Ears, nose, mouth, throat, and face: Doesn't report sore throat Respiratory: Doesn't report cough, dyspnea or wheezes Cardiovascular: Doesn't report palpitation, chest discomfort  Gastrointestinal:  Doesn't report nausea, constipation, diarrhea GU: Doesn't report incontinence Skin: Doesn't report skin rashes Neurological: Per HPI Musculoskeletal: Doesn't report joint pain Behavioral/Psych: Doesn't report anxiety  Physical Exam: Vitals:   08/01/22 1127  BP: 116/79  Pulse: (!) 50  Resp: 18  Temp: 98.2 F (36.8 C)  SpO2: 100%     KPS: 80. General: Alert, cooperative, pleasant, in no acute distress Head: Normal EENT: No conjunctival injection or scleral icterus.  Lungs: Resp effort normal Cardiac: Regular rate Abdomen: Non-distended abdomen Skin: No rashes cyanosis or petechiae. Extremities: No clubbing  or edema  Neurologic Exam: Mental Status: Awake, alert, attentive to examiner. Oriented to self and environment. Language is fluent with intact comprehension.  Cranial Nerves: Visual acuity is grossly normal. Visual fields are full. Extra-ocular movements intact. No ptosis. Face is symmetric Motor: Tone and bulk are normal. Power is 4/5 in left leg, subtle drift in left arm. Reflexes are symmetric, no pathologic reflexes present.  Sensory: Intact to light touch Gait: Hemiparetic, cane assisted.   Labs: I have reviewed the data as listed    Component Value Date/Time   NA 141 08/01/2022 1049   NA 140 03/29/2017 1045   K 3.7 08/01/2022 1049   CL 107 08/01/2022 1049   CO2 28 08/01/2022 1049   GLUCOSE 90 08/01/2022 1049   BUN 26 (H) 08/01/2022 1049   BUN 18 03/29/2017 1045   CREATININE 1.23 08/01/2022 1049   CALCIUM 9.3 08/01/2022 1049   PROT 6.3 (L) 08/01/2022 1049   PROT 7.7 03/29/2017 1045   ALBUMIN 4.0 08/01/2022 1049   ALBUMIN 5.2 03/29/2017 1045   AST 10 (L) 08/01/2022 1049   ALT 15 08/01/2022 1049   ALKPHOS 36 (L) 08/01/2022 1049   BILITOT 0.5 08/01/2022 1049   GFRNONAA >60 08/01/2022 1049   GFRAA 56 (L) 03/29/2017 1045   Lab Results  Component Value Date   WBC 12.8 (H) 08/01/2022   NEUTROABS 10.2 (H) 08/01/2022   HGB 14.5 08/01/2022   HCT 41.9 08/01/2022   MCV 104.8 (H) 08/01/2022   PLT 222 08/01/2022    High grade glioma not classifiable by WHO criteria (HCC)  Thalia Party is clinically stable today, here for cycle #2, day 1 of CPT-11/avastin.    We recommended continuing treatment with cycle #2, day 1 Irinotecan 125 mg/m2 IV q2 weeks, with concurrent Avastin 5mg /kg. The patient will have a complete blood count and CMP performed on days 14 and 28 of each cycle, and a urine protein performed on day 28 of each cycle. Labs may need to be performed more often. Zofran will prescribed for home use for nausea/vomiting.  We reviewed side effects of Irinotecan,  including diarrhea.  Imodium will  prescribed for home use.    Informed consent was obtained verbally to proceed with chemotherapy.  Chemotherapy should be held for the following:  ANC less than 1,000  Platelets less than 100,000  LFT or creatinine greater than 2x ULN  If clinical concerns/contraindications develop  Prednisone will continue 40mg  daily.   Keppra will remain at 1000mg  BID.  We ask that Thalia Party return to clinic in 2 weeks for cycle 2, day 15, or sooner as needed.  MRI will be scheduled for 08/24/22  All questions were answered. The patient knows to call the clinic with any problems, questions or concerns. No barriers to learning were detected.  The total time spent in the encounter was 30 minutes and more than 50% was on counseling and review of test results   Henreitta Leber, MD Medical Director of Neuro-Oncology [facility] 08/01/22 11:53 AM

## 2022-08-01 NOTE — Patient Instructions (Signed)
[place] [facility]  Discharge Instructions: Thank you for choosing [facility] to provide your oncology and hematology care.   If you have a lab appointment with the Cancer Center, please go directly to the Cancer Center and check in at the registration area.   Wear comfortable clothing and clothing appropriate for easy access to any Portacath or PICC line.   We strive to give you quality time with your provider. You may need to reschedule your appointment if you arrive late (15 or more minutes).  Arriving late affects you and other patients whose appointments are after yours.  Also, if you miss three or more appointments without notifying the office, you may be dismissed from the clinic at the provider's discretion.      For prescription refill requests, have your pharmacy contact our office and allow 72 hours for refills to be completed.    Today you received the following chemotherapy and/or immunotherapy agents: Bevacizumab and Irinotecan      To help prevent nausea and vomiting after your treatment, we encourage you to take your nausea medication as directed.  BELOW ARE SYMPTOMS THAT SHOULD BE REPORTED IMMEDIATELY: *FEVER GREATER THAN 100.4 F (38 C) OR HIGHER *CHILLS OR SWEATING *NAUSEA AND VOMITING THAT IS NOT CONTROLLED WITH YOUR NAUSEA MEDICATION *UNUSUAL SHORTNESS OF BREATH *UNUSUAL BRUISING OR BLEEDING *URINARY PROBLEMS (pain or burning when urinating, or frequent urination) *BOWEL PROBLEMS (unusual diarrhea, constipation, pain near the anus) TENDERNESS IN MOUTH AND THROAT WITH OR WITHOUT PRESENCE OF ULCERS (sore throat, sores in mouth, or a toothache) UNUSUAL RASH, SWELLING OR PAIN  UNUSUAL VAGINAL DISCHARGE OR ITCHING   Items with * indicate a potential emergency and should be followed up as soon as possible or go to the Emergency Department if any problems should occur.  Please show the CHEMOTHERAPY ALERT CARD or IMMUNOTHERAPY  ALERT CARD at check-in to the Emergency Department and triage nurse.  Should you have questions after your visit or need to cancel or reschedule your appointment, please contact [place] [facility]  Dept: [phone]  and follow the prompts.  Office hours are 8:00 a.m. to 4:30 p.m. Monday - Friday. Please note that voicemails left after 4:00 p.m. may not be returned until the following business day.  We are closed weekends and major holidays. You have access to a nurse at all times for urgent questions. Please call the main number to the clinic Dept: [phone] and follow the prompts.   For any non-urgent questions, you may also contact your provider using MyChart. We now offer e-Visits for anyone 79 and older to request care online for non-urgent symptoms. For details visit [email or web address].   Also download the MyChart app! Go to the app store, search "MyChart", open the app, select [facility], and log in with your MyChart username and password.

## 2022-08-03 ENCOUNTER — Other Ambulatory Visit: Payer: Self-pay

## 2022-08-09 ENCOUNTER — Other Ambulatory Visit: Payer: Self-pay | Admitting: Internal Medicine

## 2022-08-14 MED FILL — Dexamethasone Sodium Phosphate Inj 100 MG/10ML: INTRAMUSCULAR | Qty: 1 | Status: AC

## 2022-08-15 ENCOUNTER — Other Ambulatory Visit: Payer: Self-pay | Admitting: *Deleted

## 2022-08-15 ENCOUNTER — Other Ambulatory Visit: Payer: Self-pay

## 2022-08-15 ENCOUNTER — Inpatient Hospital Stay (HOSPITAL_BASED_OUTPATIENT_CLINIC_OR_DEPARTMENT_OTHER): Payer: Commercial Managed Care - HMO | Admitting: Internal Medicine

## 2022-08-15 ENCOUNTER — Inpatient Hospital Stay: Payer: Commercial Managed Care - HMO

## 2022-08-15 VITALS — BP 122/77 | HR 55 | Temp 97.9°F | Resp 20 | Wt 201.9 lb

## 2022-08-15 DIAGNOSIS — R569 Unspecified convulsions: Secondary | ICD-10-CM | POA: Diagnosis not present

## 2022-08-15 DIAGNOSIS — C719 Malignant neoplasm of brain, unspecified: Secondary | ICD-10-CM

## 2022-08-15 DIAGNOSIS — Z5112 Encounter for antineoplastic immunotherapy: Secondary | ICD-10-CM | POA: Diagnosis not present

## 2022-08-15 LAB — CMP (CANCER CENTER ONLY)
ALT: 17 U/L (ref 0–44)
AST: 11 U/L — ABNORMAL LOW (ref 15–41)
Albumin: 3.8 g/dL (ref 3.5–5.0)
Alkaline Phosphatase: 39 U/L (ref 38–126)
Anion gap: 7 (ref 5–15)
BUN: 19 mg/dL (ref 6–20)
CO2: 25 mmol/L (ref 22–32)
Calcium: 9.9 mg/dL (ref 8.9–10.3)
Chloride: 108 mmol/L (ref 98–111)
Creatinine: 1.26 mg/dL — ABNORMAL HIGH (ref 0.61–1.24)
GFR, Estimated: >60 mL/min (ref >60)
Glucose, Bld: 107 mg/dL — ABNORMAL HIGH (ref 70–99)
Potassium: 4.4 mmol/L (ref 3.5–5.1)
Sodium: 140 mmol/L (ref 135–145)
Total Bilirubin: 0.5 mg/dL (ref 0.3–1.2)
Total Protein: 5.7 g/dL — ABNORMAL LOW (ref 6.5–8.1)

## 2022-08-15 LAB — CBC WITH DIFFERENTIAL (CANCER CENTER ONLY)
Abs Immature Granulocytes: 0.13 10*3/uL — ABNORMAL HIGH (ref 0.00–0.07)
Basophils Absolute: 0.1 10*3/uL (ref 0.0–0.1)
Basophils Relative: 0 %
Eosinophils Absolute: 0 10*3/uL (ref 0.0–0.5)
Eosinophils Relative: 0 %
HCT: 41.9 % (ref 39.0–52.0)
Hemoglobin: 14.7 g/dL (ref 13.0–17.0)
Immature Granulocytes: 1 %
Lymphocytes Relative: 4 %
Lymphs Abs: 0.6 10*3/uL — ABNORMAL LOW (ref 0.7–4.0)
MCH: 36.5 pg — ABNORMAL HIGH (ref 26.0–34.0)
MCHC: 35.1 g/dL (ref 30.0–36.0)
MCV: 104 fL — ABNORMAL HIGH (ref 80.0–100.0)
Monocytes Absolute: 0.7 10*3/uL (ref 0.1–1.0)
Monocytes Relative: 5 %
Neutro Abs: 11.8 10*3/uL — ABNORMAL HIGH (ref 1.7–7.7)
Neutrophils Relative %: 90 %
Platelet Count: 205 10*3/uL (ref 150–400)
RBC: 4.03 MIL/uL — ABNORMAL LOW (ref 4.22–5.81)
RDW: 14.7 % (ref 11.5–15.5)
WBC Count: 13.3 10*3/uL — ABNORMAL HIGH (ref 4.0–10.5)
nRBC: 0 % (ref 0.0–0.2)

## 2022-08-15 LAB — TOTAL PROTEIN, URINE DIPSTICK: Protein, ur: NEGATIVE mg/dL

## 2022-08-15 MED ORDER — SODIUM CHLORIDE 0.9 % IV SOLN
10.0000 mg | Freq: Once | INTRAVENOUS | Status: AC
  Administered 2022-08-15: 10 mg via INTRAVENOUS
  Filled 2022-08-15: qty 10

## 2022-08-15 MED ORDER — ATROPINE SULFATE 1 MG/ML IV SOLN
0.5000 mg | Freq: Once | INTRAVENOUS | Status: AC | PRN
  Administered 2022-08-15: 0.5 mg via INTRAVENOUS
  Filled 2022-08-15: qty 1

## 2022-08-15 MED ORDER — SODIUM CHLORIDE 0.9 % IV SOLN
5.0000 mg/kg | Freq: Once | INTRAVENOUS | Status: AC
  Administered 2022-08-15: 500 mg via INTRAVENOUS
  Filled 2022-08-15: qty 4

## 2022-08-15 MED ORDER — SODIUM CHLORIDE 0.9 % IV SOLN: Freq: Once | INTRAVENOUS | Status: AC

## 2022-08-15 MED ORDER — PALONOSETRON HCL INJECTION 0.25 MG/5ML
0.2500 mg | Freq: Once | INTRAVENOUS | Status: AC
  Administered 2022-08-15: 0.25 mg via INTRAVENOUS
  Filled 2022-08-15: qty 5

## 2022-08-15 MED ORDER — IRINOTECAN HCL CHEMO INJECTION 100 MG/5ML
125.0000 mg/m2 | Freq: Once | INTRAVENOUS | Status: AC
  Administered 2022-08-15: 300 mg via INTRAVENOUS
  Filled 2022-08-15: qty 15

## 2022-08-15 NOTE — Progress Notes (Signed)
Pt having nose running and sweating during treatment. Has refused atropine with past treatments, but given with this treatment. His wife will monitor for side effects of constipation at home, and he may or may not want atropine with next treatment

## 2022-08-15 NOTE — Progress Notes (Signed)
[facility] [address]  [place], [place] [address] [phone]   Interval Evaluation  Date of Service: 08/15/22 Patient Name: Jerry Richards Patient MRN: [id number] Patient DOB: 08/18/1970 Provider: Henreitta Leber, MD  Identifying Statement:  TIREE Richards is a 52 y.o. male with right frontal glioblastoma   Oncologic History: Oncology History  High grade glioma not classifiable by WHO criteria (HCC)  05/16/2021 Surgery   Craniotomy, R parafalcine resection with Dr. Jordan Likes; path is high grade glioma IDHwt   06/20/2021 - 06/20/2021 Chemotherapy   Patient is on Treatment Plan : BRAIN GLIOBLASTOMA Radiation Therapy With Concurrent Temozolomide 75 mg/m2 Daily Followed By Sequential Maintenance Temozolomide x 6-12 cycles     09/05/2021 - 12/17/2021 Chemotherapy   Completes 3 cycles of adjuvant 5-day Temodar   12/18/2021 Progression   Progression of disease #1   12/29/2021 -  Chemotherapy   Initiates second line therapy with CCNU 90mg /m2 q6 weeks, and Avastin 10mg /kg IV q2 weeks   06/12/2022 - 06/12/2022 Chemotherapy   Patient is on Treatment Plan : BRAIN GBM Bevacizumab 14d x 6 cycles     07/03/2022 -  Chemotherapy   Patient is on Treatment Plan : BRAIN GBM [facility] Irinotecan D1,15 + Bevacizumab D1,15 q 28d     Glioblastoma multiforme of frontal lobe (HCC)  09/12/2021 Initial Diagnosis   Glioblastoma multiforme of frontal lobe (HCC)   12/29/2021 - 05/11/2022 Chemotherapy   Patient is on Treatment Plan : BRAIN GBM Bevacizumab q14d       Biomarkers:  MGMT Unknown.  IDH 1/2 Wild type.  EGFR Unknown  TERT Unknown   Interval History: Jerry Richards presents today for Irinotecan and Avastin.  No new or progressive changes today.  Denies any recurrence of seizures, no headaches.  Leg symptoms are stable.    H+P (06/07/21): Patient presented to neurologic attention in March with several days of progressive left sided weakness and seizures,  characterized by twitching of the left leg.  CNS imaging demonstrated a right frontal mass.  He underwent craniotomy and resection with Dr. Jordan Likes on 05/16/21; path demonstrated high grade glioma IDHwt.  Following surgery his left leg was much weaker, though it has improved with aggressive rehab efforts.  Currently he is using a cane to ambulate.  He has no other complaints, continues on decadron 2mg  twice per day.  No seizures since surgery.  Medications: Current Outpatient Medications on File Prior to Visit  Medication Sig Dispense Refill   acetaminophen (TYLENOL) 500 MG tablet Take 1,000 mg by mouth every 6 (six) hours as needed for moderate pain.     albuterol (VENTOLIN HFA) 108 (90 Base) MCG/ACT inhaler Inhale 1 puff into the lungs every 4 (four) hours as needed.     levETIRAcetam (KEPPRA) 500 MG tablet TAKE 2 TABS BY MOUTH TWICE DAILY 360 tablet 0   losartan (COZAAR) 50 MG tablet Take 50 mg by mouth daily.  6   mirtazapine (REMERON) 15 MG tablet Take 15 mg by mouth at bedtime.     predniSONE (DELTASONE) 20 MG tablet TAKE 2 TABLETS(40 MG) BY MOUTH DAILY WITH BREAKFAST 60 tablet 1   rivaroxaban (XARELTO) 20 MG TABS tablet Take 1 tablet (20 mg total) by mouth daily. 90 tablet 1   Current Facility-Administered Medications on File Prior to Visit  Medication Dose Route Frequency Provider Last Rate Last Admin   atropine injection 0.5 mg  0.5 mg Intravenous Once PRN Barbaraann Cao, Georgeanna Lea, MD  irinotecan (CAMPTOSAR) 300 mg in dextrose 5 % 500 mL chemo infusion  125 mg/m2 (Treatment Plan Recorded) Intravenous Once Henreitta Leber, MD 343 mL/hr at 08/15/22 1440 300 mg at 08/15/22 1440    Allergies:  Allergies  Allergen Reactions   Amiodarone     AMIODARONE ANALOGUES Hyperthyroidism   Morphine And Codeine Itching and Palpitations    Chest pressure/palpitations   Past Medical History:  Past Medical History:  Diagnosis Date   Anxiety    Atrial fibrillation (HCC)    a. s/p RFCA x 2  (10/26/2011, 04/15/2012)   Atrial flutter (HCC)    Chronic kidney disease    Clotting disorder (HCC)    Colitis    Current smoker    Diverticulitis    Finger near amputation, left    Hyperthyroidism    Secondary to amiodarone   LA thrombus    Non-ischemic cardiomyopathy (HCC)    OSA on CPAP    Sinus trouble    Sleep apnea    Systolic heart failure (HCC)    EF 20-25%-> 50-55% in 10/2011. MOST RECENT ECHO (03/2012) EF= 66%   Past Surgical History:  Past Surgical History:  Procedure Laterality Date   APPLICATION OF CRANIAL NAVIGATION Right 05/16/2021   Procedure: APPLICATION OF CRANIAL NAVIGATION;  Surgeon: Julio Sicks, MD;  Location: [facility] OR;  Service: Neurosurgery;  Laterality: Right;   ATRIAL ABLATION SURGERY     CARDIAC CATHETERIZATION  08/2011   [facility]   CHOLECYSTECTOMY     CRANIOTOMY Right 05/16/2021   Procedure: Craniotomy - right - Parietal with  brainlab;  Surgeon: Julio Sicks, MD;  Location: [facility] OR;  Service: Neurosurgery;  Laterality: Right;   FINGER SURGERY     LEFT HEART CATHETERIZATION WITH CORONARY ANGIOGRAM N/A 09/12/2011   Procedure: LEFT HEART CATHETERIZATION WITH CORONARY ANGIOGRAM;  Surgeon: Herby Abraham, MD;  Location: [facility] CATH LAB;  Service: Cardiovascular;  Laterality: N/A;   VASECTOMY     Social History:  Social History   Socioeconomic History   Marital status: Married    Spouse name: tanya   Number of children: 2   Years of education: Not on file   Highest education level: Some college, no degree  Occupational History   Occupation: SELF EMPLOYED    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 Occas tea   Social Determinants of Health   Financial Resource Strain: Not on file  Food Insecurity: Not on file  Transportation Needs: Not on file  Physical Activity: Not on file  Stress: Not on file  Social Connections: Not on file  Intimate Partner Violence: Not on file   Family History:  Family History  Problem Relation Age of Onset   Healthy Mother        age 62   Melanoma Mother    Melanoma Father  Atrial fibrillation Father        age 68   Diabetes Maternal Grandmother    Coronary artery disease Paternal Grandmother    Heart disease Paternal Grandmother    Heart attack Paternal Grandmother    Coronary artery disease Paternal Grandfather    Heart disease Paternal Grandfather    Heart attack Paternal Grandfather    Atrial fibrillation Paternal Aunt    Atrial fibrillation Paternal Uncle    Colon cancer Neg Hx    Esophageal cancer Neg Hx    Ovarian cancer Neg Hx    Stomach cancer Neg Hx    Rectal cancer Neg Hx     Review of Systems: Constitutional: Doesn't report fevers, chills or abnormal weight loss Eyes: Doesn't report blurriness of vision Ears, nose, mouth, throat, and face: Doesn't report sore throat Respiratory: Doesn't report cough, dyspnea or wheezes Cardiovascular: Doesn't report palpitation, chest discomfort  Gastrointestinal:  Doesn't report nausea, constipation, diarrhea GU: Doesn't report incontinence Skin: Doesn't report skin rashes Neurological: Per HPI Musculoskeletal: Doesn't report joint pain Behavioral/Psych: Doesn't report anxiety  Physical Exam: Vitals:   08/15/22 1200  BP: 122/77  Pulse: (!) 55  Resp: 20  Temp: 97.9 F (36.6 C)  SpO2: 100%     KPS: 80. General: Alert, cooperative, pleasant, in no acute distress Head: Normal EENT: No conjunctival injection or scleral icterus.  Lungs:  Resp effort normal Cardiac: Regular rate Abdomen: Non-distended abdomen Skin: No rashes cyanosis or petechiae. Extremities: No clubbing or edema  Neurologic Exam: Mental Status: Awake, alert, attentive to examiner. Oriented to self and environment. Language is fluent with intact comprehension.  Cranial Nerves: Visual acuity is grossly normal. Visual fields are full. Extra-ocular movements intact. No ptosis. Face is symmetric Motor: Tone and bulk are normal. Power is 4/5 in left leg, subtle drift in left arm. Reflexes are symmetric, no pathologic reflexes present.  Sensory: Intact to light touch Gait: Hemiparetic, cane assisted.   Labs: I have reviewed the data as listed    Component Value Date/Time   NA 140 08/15/2022 1120   NA 140 03/29/2017 1045   K 4.4 08/15/2022 1120   CL 108 08/15/2022 1120   CO2 25 08/15/2022 1120   GLUCOSE 107 (H) 08/15/2022 1120   BUN 19 08/15/2022 1120   BUN 18 03/29/2017 1045   CREATININE 1.26 (H) 08/15/2022 1120   CALCIUM 9.9 08/15/2022 1120   PROT 5.7 (L) 08/15/2022 1120   PROT 7.7 03/29/2017 1045   ALBUMIN 3.8 08/15/2022 1120   ALBUMIN 5.2 03/29/2017 1045   AST 11 (L) 08/15/2022 1120   ALT 17 08/15/2022 1120   ALKPHOS 39 08/15/2022 1120   BILITOT 0.5 08/15/2022 1120   GFRNONAA >60 08/15/2022 1120   GFRAA 56 (L) 03/29/2017 1045   Lab Results  Component Value Date   WBC 13.3 (H) 08/15/2022   NEUTROABS 11.8 (H) 08/15/2022   HGB 14.7 08/15/2022   HCT 41.9 08/15/2022   MCV 104.0 (H) 08/15/2022   PLT 205 08/15/2022    High grade glioma not classifiable by WHO criteria (HCC) - Plan: Clinic Appointment Request, Infusion Appointment Request, Clinic Appointment Request, Lab Appointment Request, CBC with Differential (Cancer Center Only), CMP (Cancer Center only), Total Protein, Urine dipstick, Infusion Appointment Request, Clinic Appointment Request, Lab Appointment Request, CBC with Differential (Cancer Center Only), CMP (Cancer Center  only)  Focal seizures (HCC)  BUZZ VITULLI is clinically stable today, here for cycle #2, day 15 of CPT-11/avastin.    We recommended continuing  treatment with cycle #2, day 15 Irinotecan 125 mg/m2 IV q2 weeks, with concurrent Avastin 5mg /kg. The patient will have a complete blood count and CMP performed on days 14 and 28 of each cycle, and a urine protein performed on day 28 of each cycle. Labs may need to be performed more often. Zofran will prescribed for home use for nausea/vomiting.  We reviewed side effects of Irinotecan, including diarrhea.  Imodium will prescribed for home use.    Informed consent was obtained verbally to proceed with chemotherapy.  Chemotherapy should be held for the following:  ANC less than 1,000  Platelets less than 100,000  LFT or creatinine greater than 2x ULN  If clinical concerns/contraindications develop  Prednisone will continue 40mg  daily.   Keppra will remain at 1000mg  BID.  We ask that Thalia Party return to clinic in 2 weeks for cycle 3, day 1, or sooner as needed.  MRI will be scheduled for 08/24/22  All questions were answered. The patient knows to call the clinic with any problems, questions or concerns. No barriers to learning were detected.  The total time spent in the encounter was 30 minutes and more than 50% was on counseling and review of test results   Henreitta Leber, MD Medical Director of Neuro-Oncology [facility] 08/15/22 3:02 PM

## 2022-08-15 NOTE — Patient Instructions (Signed)
[facility]  Discharge Instructions: Thank you for choosing [facility] to provide your oncology and hematology care.   If you have a lab appointment with the Cancer Center, please go directly to the Cancer Center and check in at the registration area.   Wear comfortable clothing and clothing appropriate for easy access to any Portacath or PICC line.   We strive to give you quality time with your provider. You may need to reschedule your appointment if you arrive late (15 or more minutes).  Arriving late affects you and other patients whose appointments are after yours.  Also, if you miss three or more appointments without notifying the office, you may be dismissed from the clinic at the provider's discretion.      For prescription refill requests, have your pharmacy contact our office and allow 72 hours for refills to be completed.    Today you received the following chemotherapy and/or immunotherapy agents: Bevacizumab and Irinotecan      To help prevent nausea and vomiting after your treatment, we encourage you to take your nausea medication as directed.  BELOW ARE SYMPTOMS THAT SHOULD BE REPORTED IMMEDIATELY: *FEVER GREATER THAN 100.4 F (38 C) OR HIGHER *CHILLS OR SWEATING *NAUSEA AND VOMITING THAT IS NOT CONTROLLED WITH YOUR NAUSEA MEDICATION *UNUSUAL SHORTNESS OF BREATH *UNUSUAL BRUISING OR BLEEDING *URINARY PROBLEMS (pain or burning when urinating, or frequent urination) *BOWEL PROBLEMS (unusual diarrhea, constipation, pain near the anus) TENDERNESS IN MOUTH AND THROAT WITH OR WITHOUT PRESENCE OF ULCERS (sore throat, sores in mouth, or a toothache) UNUSUAL RASH, SWELLING OR PAIN  UNUSUAL VAGINAL DISCHARGE OR ITCHING   Items with * indicate a potential emergency and should be followed up as soon as possible or go to the Emergency Department if any problems should occur.  Please show the CHEMOTHERAPY ALERT CARD or IMMUNOTHERAPY  ALERT CARD at check-in to the Emergency Department and triage nurse.  Should you have questions after your visit or need to cancel or reschedule your appointment, please contact [facility]  Dept: [phone]  and follow the prompts.  Office hours are 8:00 a.m. to 4:30 p.m. Monday - Friday. Please note that voicemails left after 4:00 p.m. may not be returned until the following business day.  We are closed weekends and major holidays. You have access to a nurse at all times for urgent questions. Please call the main number to the clinic Dept: [phone] and follow the prompts.   For any non-urgent questions, you may also contact your provider using MyChart. We now offer e-Visits for anyone 18 and older to request care online for non-urgent symptoms. For details visit [email or web address].   Also download the MyChart app! Go to the app store, search "MyChart", open the app, select [facility], and log in with your MyChart username and password.   

## 2022-08-17 ENCOUNTER — Other Ambulatory Visit: Payer: Self-pay

## 2022-08-18 ENCOUNTER — Other Ambulatory Visit: Payer: Self-pay

## 2022-08-18 ENCOUNTER — Telehealth: Payer: Self-pay | Admitting: Internal Medicine

## 2022-08-18 NOTE — Telephone Encounter (Signed)
Scheduled per 06/18 los, patient has been called and notified of upcoming appointments.

## 2022-08-23 ENCOUNTER — Ambulatory Visit
Admission: RE | Admit: 2022-08-23 | Discharge: 2022-08-23 | Disposition: A | Discharge status: Home / Self Care | Payer: Commercial Managed Care - HMO | Source: Ambulatory Visit | Attending: Internal Medicine | Admitting: Internal Medicine

## 2022-08-23 DIAGNOSIS — C719 Malignant neoplasm of brain, unspecified: Secondary | ICD-10-CM

## 2022-08-23 MED ORDER — GADOPICLENOL 0.5 MMOL/ML IV SOLN
10.0000 mL | Freq: Once | INTRAVENOUS | Status: AC | PRN
  Administered 2022-08-23: 10 mL via INTRAVENOUS

## 2022-08-25 MED FILL — Dexamethasone Sodium Phosphate Inj 100 MG/10ML: INTRAMUSCULAR | Qty: 1 | Status: AC

## 2022-08-28 ENCOUNTER — Ambulatory Visit: Payer: Commercial Managed Care - HMO | Admitting: Internal Medicine

## 2022-08-28 ENCOUNTER — Inpatient Hospital Stay: Payer: Commercial Managed Care - HMO | Attending: Neurosurgery

## 2022-08-28 ENCOUNTER — Other Ambulatory Visit: Payer: Self-pay

## 2022-08-28 ENCOUNTER — Inpatient Hospital Stay (HOSPITAL_BASED_OUTPATIENT_CLINIC_OR_DEPARTMENT_OTHER): Payer: Commercial Managed Care - HMO | Admitting: Internal Medicine

## 2022-08-28 ENCOUNTER — Encounter: Payer: Self-pay | Admitting: Internal Medicine

## 2022-08-28 ENCOUNTER — Inpatient Hospital Stay: Payer: Commercial Managed Care - HMO

## 2022-08-28 ENCOUNTER — Other Ambulatory Visit: Payer: Commercial Managed Care - HMO

## 2022-08-28 VITALS — BP 129/84 | HR 80 | Temp 97.7°F | Resp 17 | Wt 202.0 lb

## 2022-08-28 DIAGNOSIS — Z79899 Other long term (current) drug therapy: Secondary | ICD-10-CM | POA: Insufficient documentation

## 2022-08-28 DIAGNOSIS — R531 Weakness: Secondary | ICD-10-CM | POA: Diagnosis not present

## 2022-08-28 DIAGNOSIS — Z8249 Family history of ischemic heart disease and other diseases of the circulatory system: Secondary | ICD-10-CM | POA: Diagnosis not present

## 2022-08-28 DIAGNOSIS — C719 Malignant neoplasm of brain, unspecified: Secondary | ICD-10-CM

## 2022-08-28 DIAGNOSIS — Z7901 Long term (current) use of anticoagulants: Secondary | ICD-10-CM | POA: Diagnosis not present

## 2022-08-28 DIAGNOSIS — Z9049 Acquired absence of other specified parts of digestive tract: Secondary | ICD-10-CM | POA: Insufficient documentation

## 2022-08-28 DIAGNOSIS — Z7189 Other specified counseling: Secondary | ICD-10-CM | POA: Diagnosis not present

## 2022-08-28 DIAGNOSIS — Z808 Family history of malignant neoplasm of other organs or systems: Secondary | ICD-10-CM | POA: Diagnosis not present

## 2022-08-28 DIAGNOSIS — Z7952 Long term (current) use of systemic steroids: Secondary | ICD-10-CM | POA: Diagnosis not present

## 2022-08-28 DIAGNOSIS — C711 Malignant neoplasm of frontal lobe: Secondary | ICD-10-CM | POA: Diagnosis not present

## 2022-08-28 DIAGNOSIS — Z833 Family history of diabetes mellitus: Secondary | ICD-10-CM | POA: Diagnosis not present

## 2022-08-28 DIAGNOSIS — M25519 Pain in unspecified shoulder: Secondary | ICD-10-CM | POA: Insufficient documentation

## 2022-08-28 DIAGNOSIS — R569 Unspecified convulsions: Secondary | ICD-10-CM | POA: Insufficient documentation

## 2022-08-28 DIAGNOSIS — Z79630 Long term (current) use of alkylating agent: Secondary | ICD-10-CM | POA: Diagnosis not present

## 2022-08-28 DIAGNOSIS — F17210 Nicotine dependence, cigarettes, uncomplicated: Secondary | ICD-10-CM | POA: Diagnosis not present

## 2022-08-28 DIAGNOSIS — Z885 Allergy status to narcotic agent status: Secondary | ICD-10-CM | POA: Diagnosis not present

## 2022-08-28 LAB — CBC WITH DIFFERENTIAL (CANCER CENTER ONLY)
Abs Immature Granulocytes: 0.15 10*3/uL — ABNORMAL HIGH (ref 0.00–0.07)
Basophils Absolute: 0 10*3/uL (ref 0.0–0.1)
Basophils Relative: 0 %
Eosinophils Absolute: 0 10*3/uL (ref 0.0–0.5)
Eosinophils Relative: 0 %
HCT: 44.6 % (ref 39.0–52.0)
Hemoglobin: 15.3 g/dL (ref 13.0–17.0)
Immature Granulocytes: 1 %
Lymphocytes Relative: 4 %
Lymphs Abs: 0.6 10*3/uL — ABNORMAL LOW (ref 0.7–4.0)
MCH: 35.5 pg — ABNORMAL HIGH (ref 26.0–34.0)
MCHC: 34.3 g/dL (ref 30.0–36.0)
MCV: 103.5 fL — ABNORMAL HIGH (ref 80.0–100.0)
Monocytes Absolute: 0.6 10*3/uL (ref 0.1–1.0)
Monocytes Relative: 4 %
Neutro Abs: 13 10*3/uL — ABNORMAL HIGH (ref 1.7–7.7)
Neutrophils Relative %: 91 %
Platelet Count: 217 10*3/uL (ref 150–400)
RBC: 4.31 MIL/uL (ref 4.22–5.81)
RDW: 14.6 % (ref 11.5–15.5)
WBC Count: 14.3 10*3/uL — ABNORMAL HIGH (ref 4.0–10.5)
nRBC: 0 % (ref 0.0–0.2)

## 2022-08-28 LAB — CMP (CANCER CENTER ONLY)
ALT: 18 U/L (ref 0–44)
AST: 10 U/L — ABNORMAL LOW (ref 15–41)
Albumin: 3.9 g/dL (ref 3.5–5.0)
Alkaline Phosphatase: 35 U/L — ABNORMAL LOW (ref 38–126)
Anion gap: 7 (ref 5–15)
BUN: 18 mg/dL (ref 6–20)
CO2: 27 mmol/L (ref 22–32)
Calcium: 8.8 mg/dL — ABNORMAL LOW (ref 8.9–10.3)
Chloride: 106 mmol/L (ref 98–111)
Creatinine: 1.21 mg/dL (ref 0.61–1.24)
GFR, Estimated: >60 mL/min (ref >60)
Glucose, Bld: 88 mg/dL (ref 70–99)
Potassium: 3.6 mmol/L (ref 3.5–5.1)
Sodium: 140 mmol/L (ref 135–145)
Total Bilirubin: 0.7 mg/dL (ref 0.3–1.2)
Total Protein: 6.4 g/dL — ABNORMAL LOW (ref 6.5–8.1)

## 2022-08-28 LAB — TOTAL PROTEIN, URINE DIPSTICK: Protein, ur: NEGATIVE mg/dL

## 2022-08-28 NOTE — Progress Notes (Signed)
[facility] [address]  [place], [place] [address] [phone]   Interval Evaluation  Date of Service: 08/28/22 Patient Name: Mare Neisen Patient MRN: [id number] Patient DOB: [birth year] Provider: Henreitta Leber, MD  Identifying Statement:  Dayle Bagshaw is a 52 y.o. male with right frontal glioblastoma   Oncologic History: Oncology History  High grade glioma not classifiable by WHO criteria (HCC)  05/16/2021 Surgery   Craniotomy, R parafalcine resection with Dr. Jordan Likes; path is high grade glioma IDHwt   06/20/2021 - 06/20/2021 Chemotherapy   Patient is on Treatment Plan : BRAIN GLIOBLASTOMA Radiation Therapy With Concurrent Temozolomide 75 mg/m2 Daily Followed By Sequential Maintenance Temozolomide x 6-12 cycles     09/05/2021 - 12/17/2021 Chemotherapy   Completes 3 cycles of adjuvant 5-day Temodar   12/18/2021 Progression   Progression of disease #1   12/29/2021 -  Chemotherapy   Initiates second line therapy with CCNU 90mg /m2 q6 weeks, and Avastin 10mg /kg IV q2 weeks   06/12/2022 - 06/12/2022 Chemotherapy   Patient is on Treatment Plan : BRAIN GBM Bevacizumab 14d x 6 cycles     07/03/2022 - 08/15/2022 Chemotherapy   Patient is on Treatment Plan : BRAIN GBM [facility] Irinotecan D1,15 + Bevacizumab D1,15 q 28d     09/04/2022 -  Chemotherapy   Patient is on Treatment Plan : Brain Bevacizumab D1, D15 + Carboplatin (4) D1 q28d x 6 cycles     Glioblastoma multiforme of frontal lobe (HCC)  09/12/2021 Initial Diagnosis   Glioblastoma multiforme of frontal lobe (HCC)   12/29/2021 - 05/11/2022 Chemotherapy   Patient is on Treatment Plan : BRAIN GBM Bevacizumab q14d       Biomarkers:  MGMT Unknown.  IDH 1/2 Wild type.  EGFR Unknown  TERT Unknown   Interval History: Izzac Moulin presents today for Irinotecan and Avastin.  No new or progressive changes today.  Denies any recurrence of seizures, no headaches.  Leg symptoms  are stable.    H+P (06/07/21): Patient presented to neurologic attention in March with several days of progressive left sided weakness and seizures, characterized by twitching of the left leg.  CNS imaging demonstrated a right frontal mass.  He underwent craniotomy and resection with Dr. Jordan Likes on 05/16/21; path demonstrated high grade glioma IDHwt.  Following surgery his left leg was much weaker, though it has improved with aggressive rehab efforts.  Currently he is using a cane to ambulate.  He has no other complaints, continues on decadron 2mg  twice per day.  No seizures since surgery.  Medications: Current Outpatient Medications on File Prior to Visit  Medication Sig Dispense Refill   acetaminophen (TYLENOL) 500 MG tablet Take 1,000 mg by mouth every 6 (six) hours as needed for moderate pain.     albuterol (VENTOLIN HFA) 108 (90 Base) MCG/ACT inhaler Inhale 1 puff into the lungs every 4 (four) hours as needed.     levETIRAcetam (KEPPRA) 500 MG tablet TAKE 2 TABS BY MOUTH TWICE DAILY 360 tablet 0   losartan (COZAAR) 50 MG tablet Take 50 mg by mouth daily.  6   mirtazapine (REMERON) 15 MG tablet Take 15 mg by mouth at bedtime.     predniSONE (DELTASONE) 20 MG tablet TAKE 2 TABLETS(40 MG) BY MOUTH DAILY WITH BREAKFAST 60 tablet 1   rivaroxaban (XARELTO) 20 MG TABS tablet Take 1 tablet (20 mg total) by mouth daily. 90 tablet 1   No current facility-administered medications on file  prior to visit.    Allergies:  Allergies  Allergen Reactions   Amiodarone     AMIODARONE ANALOGUES Hyperthyroidism   Morphine And Codeine Itching and Palpitations    Chest pressure/palpitations   Past Medical History:  Past Medical History:  Diagnosis Date   Anxiety    Atrial fibrillation (HCC)    a. s/p RFCA x 2 (10/26/2011, 04/15/2012)   Atrial flutter (HCC)    Chronic kidney disease    Clotting disorder (HCC)    Colitis    Current smoker    Diverticulitis    Finger near amputation, left     Hyperthyroidism    Secondary to amiodarone   LA thrombus    Non-ischemic cardiomyopathy (HCC)    OSA on CPAP    Sinus trouble    Sleep apnea    Systolic heart failure (HCC)    EF 20-25%-> 50-55% in 10/2011. MOST RECENT ECHO (03/2012) EF= 66%   Past Surgical History:  Past Surgical History:  Procedure Laterality Date   APPLICATION OF CRANIAL NAVIGATION Right 05/16/2021   Procedure: APPLICATION OF CRANIAL NAVIGATION;  Surgeon: Julio Sicks, MD;  Location: [facility] OR;  Service: Neurosurgery;  Laterality: Right;   ATRIAL ABLATION SURGERY     CARDIAC CATHETERIZATION  08/2011   [facility]   CHOLECYSTECTOMY     CRANIOTOMY Right 05/16/2021   Procedure: Craniotomy - right - Parietal with  brainlab;  Surgeon: Julio Sicks, MD;  Location: [facility] OR;  Service: Neurosurgery;  Laterality: Right;   FINGER SURGERY     LEFT HEART CATHETERIZATION WITH CORONARY ANGIOGRAM N/A 09/12/2011   Procedure: LEFT HEART CATHETERIZATION WITH CORONARY ANGIOGRAM;  Surgeon: Herby Abraham, MD;  Location: [facility] CATH LAB;  Service: Cardiovascular;  Laterality: N/A;   VASECTOMY     Social History:  Social History   Socioeconomic History   Marital status: Married    Spouse name: tanya   Number of children: 2   Years of education: Not on file   Highest education level: Some college, no degree  Occupational History   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 Occas tea   Social Determinants of Health    Financial Resource Strain: Not on file  Food Insecurity: Not on file  Transportation Needs: Not on file  Physical Activity: Not on file  Stress: Not on file  Social Connections: Not on file  Intimate Partner Violence: Not on file   Family History:  Family History  Problem Relation Age of Onset   Healthy Mother        age 60   Melanoma Mother    Melanoma Father    Atrial fibrillation Father        age 77   Diabetes Maternal Grandmother    Coronary artery disease Paternal Grandmother    Heart  disease Paternal Grandmother    Heart attack Paternal Grandmother    Coronary artery disease Paternal Grandfather    Heart disease Paternal Grandfather    Heart attack Paternal Grandfather    Atrial fibrillation Paternal Aunt    Atrial fibrillation Paternal Uncle    Colon cancer Neg Hx    Esophageal cancer Neg Hx    Ovarian cancer Neg Hx    Stomach cancer Neg Hx    Rectal cancer Neg Hx     Review of Systems: Constitutional: Doesn't report fevers, chills or abnormal weight loss Eyes: Doesn't report blurriness of vision Ears, nose, mouth, throat, and face: Doesn't report sore throat Respiratory: Doesn't report cough, dyspnea or wheezes Cardiovascular: Doesn't report palpitation, chest discomfort  Gastrointestinal:  Doesn't report nausea, constipation, diarrhea GU: Doesn't report incontinence Skin: Doesn't report skin rashes Neurological: Per HPI Musculoskeletal: Doesn't report joint pain Behavioral/Psych: Doesn't report anxiety  Physical Exam: Vitals:   08/28/22 1127  BP: 129/84  Pulse: 80  Resp: 17  Temp: 97.7 F (36.5 C)  SpO2: 100%     KPS: 80. General: Alert, cooperative, pleasant, in no acute distress Head: Normal EENT: No conjunctival injection or scleral icterus.  Lungs: Resp effort normal Cardiac: Regular rate Abdomen: Non-distended abdomen Skin: No rashes cyanosis or petechiae. Extremities: No clubbing or edema  Neurologic Exam: Mental Status: Awake,  alert, attentive to examiner. Oriented to self and environment. Language is fluent with intact comprehension.  Cranial Nerves: Visual acuity is grossly normal. Visual fields are full. Extra-ocular movements intact. No ptosis. Face is symmetric Motor: Tone and bulk are normal. Power is 4/5 in left leg, subtle drift in left arm. Reflexes are symmetric, no pathologic reflexes present.  Sensory: Intact to light touch Gait: Hemiparetic, cane assisted.   Labs: I have reviewed the data as listed    Component Value Date/Time   NA 140 08/28/2022 1047   NA 140 03/29/2017 1045   K 3.6 08/28/2022 1047   CL 106 08/28/2022 1047   CO2 27 08/28/2022 1047   GLUCOSE 88 08/28/2022 1047   BUN 18 08/28/2022 1047   BUN 18 03/29/2017 1045   CREATININE 1.21 08/28/2022 1047   CALCIUM 8.8 (L) 08/28/2022 1047   PROT 6.4 (L) 08/28/2022 1047   PROT 7.7 03/29/2017 1045   ALBUMIN 3.9 08/28/2022 1047   ALBUMIN 5.2 03/29/2017 1045   AST 10 (L) 08/28/2022 1047   ALT 18 08/28/2022 1047   ALKPHOS 35 (L) 08/28/2022 1047   BILITOT 0.7 08/28/2022 1047   GFRNONAA >60 08/28/2022 1047   GFRAA 56 (L) 03/29/2017 1045   Lab Results  Component Value Date   WBC 14.3 (H) 08/28/2022   NEUTROABS 13.0 (H) 08/28/2022   HGB 15.3 08/28/2022   HCT 44.6 08/28/2022   MCV 103.5 (H) 08/28/2022   PLT 217 08/28/2022    High grade glioma not classifiable by WHO criteria (HCC) - Plan: Clinic Appointment Request, Infusion Appointment Request (135 min), Clinic Appointment Request, Lab Appointment Request, CBC with Differential (Cancer Center Only), CMP (Cancer Center only), Total Protein, Urine dipstick  Goals of care, counseling/discussion  Toney Sang presents today with modest progression of motor dysfunction and gait.  His MRI brain unfortunately demonstrates frank progression of bulky enhancing disease surrounding right lateral ventricle and corpus callosum.    Goals of care were discussed, he elected to proceed with  further life-prolonging measures.    We recommended holding further Irinotecan.  Recommended transitioning to next line therapy with carboplatin AUC 4  IV q3 weeks, with concurrent Avastin 5mg /kg. The patient will have a complete blood count and CMP performed  prior to each each cycle, and a urine protein performed on day 21 of each cycle. Labs may need to be performed more often. Zofran will prescribed for home use for nausea/vomiting.  We reviewed side effects of carboplatin, including cytopenias.     Informed consent was obtained verbally to proceed with chemotherapy.  Chemotherapy should be held for the following:  ANC less than 1,000  Platelets less than 100,000  LFT or creatinine greater than 2x ULN  If clinical concerns/contraindications develop  Prednisone will continue 40mg  daily.   Keppra will remain at 1000mg  BID.  We ask that Toney Sang return to clinic in 1 week for initiation of next line chemotherapy.  All questions were answered. The patient knows to call the clinic with any problems, questions or concerns. No barriers to learning were detected.  The total time spent in the encounter was 40 minutes and more than 50% was on counseling and review of test results   Henreitta Leber, MD Medical Director of Neuro-Oncology [facility] at [facility] 08/28/22 2:22 PM

## 2022-08-29 ENCOUNTER — Ambulatory Visit: Payer: Commercial Managed Care - HMO

## 2022-08-30 ENCOUNTER — Telehealth: Payer: Self-pay | Admitting: Internal Medicine

## 2022-08-31 ENCOUNTER — Other Ambulatory Visit: Payer: Self-pay

## 2022-09-04 ENCOUNTER — Other Ambulatory Visit: Payer: Commercial Managed Care - HMO

## 2022-09-04 ENCOUNTER — Ambulatory Visit: Payer: Commercial Managed Care - HMO | Admitting: Internal Medicine

## 2022-09-04 ENCOUNTER — Ambulatory Visit: Payer: Commercial Managed Care - HMO

## 2022-09-05 ENCOUNTER — Other Ambulatory Visit: Payer: Self-pay

## 2022-09-11 ENCOUNTER — Other Ambulatory Visit: Payer: Commercial Managed Care - HMO

## 2022-09-11 ENCOUNTER — Ambulatory Visit: Payer: Commercial Managed Care - HMO | Admitting: Internal Medicine

## 2022-09-11 ENCOUNTER — Ambulatory Visit: Payer: Commercial Managed Care - HMO

## 2022-09-12 ENCOUNTER — Ambulatory Visit: Payer: Commercial Managed Care - HMO | Admitting: Internal Medicine

## 2022-09-12 ENCOUNTER — Ambulatory Visit: Payer: Commercial Managed Care - HMO

## 2022-09-12 ENCOUNTER — Other Ambulatory Visit: Payer: Commercial Managed Care - HMO

## 2022-09-13 ENCOUNTER — Other Ambulatory Visit: Payer: Self-pay

## 2022-09-14 ENCOUNTER — Other Ambulatory Visit: Payer: Self-pay | Admitting: *Deleted

## 2022-09-14 DIAGNOSIS — C719 Malignant neoplasm of brain, unspecified: Secondary | ICD-10-CM

## 2022-09-18 ENCOUNTER — Inpatient Hospital Stay: Payer: Commercial Managed Care - HMO

## 2022-09-18 ENCOUNTER — Inpatient Hospital Stay: Payer: Commercial Managed Care - HMO | Admitting: Internal Medicine

## 2022-09-18 VITALS — BP 137/90 | HR 92 | Temp 97.8°F | Resp 16 | Wt 198.7 lb

## 2022-09-18 DIAGNOSIS — C719 Malignant neoplasm of brain, unspecified: Secondary | ICD-10-CM

## 2022-09-18 DIAGNOSIS — C711 Malignant neoplasm of frontal lobe: Secondary | ICD-10-CM | POA: Diagnosis not present

## 2022-09-18 DIAGNOSIS — R569 Unspecified convulsions: Secondary | ICD-10-CM

## 2022-09-18 DIAGNOSIS — Z7189 Other specified counseling: Secondary | ICD-10-CM

## 2022-09-18 LAB — CMP (CANCER CENTER ONLY)
ALT: 17 U/L (ref 0–44)
AST: 11 U/L — ABNORMAL LOW (ref 15–41)
Albumin: 4.2 g/dL (ref 3.5–5.0)
Alkaline Phosphatase: 37 U/L — ABNORMAL LOW (ref 38–126)
Anion gap: 9 (ref 5–15)
BUN: 19 mg/dL (ref 6–20)
CO2: 23 mmol/L (ref 22–32)
Calcium: 9.6 mg/dL (ref 8.9–10.3)
Chloride: 107 mmol/L (ref 98–111)
Creatinine: 1.22 mg/dL (ref 0.61–1.24)
GFR, Estimated: >60 mL/min (ref >60)
Glucose, Bld: 152 mg/dL — ABNORMAL HIGH (ref 70–99)
Potassium: 4 mmol/L (ref 3.5–5.1)
Sodium: 139 mmol/L (ref 135–145)
Total Bilirubin: 0.5 mg/dL (ref 0.3–1.2)
Total Protein: 6.3 g/dL — ABNORMAL LOW (ref 6.5–8.1)

## 2022-09-18 LAB — CBC WITH DIFFERENTIAL (CANCER CENTER ONLY)
Abs Immature Granulocytes: 0.11 10*3/uL — ABNORMAL HIGH (ref 0.00–0.07)
Basophils Absolute: 0 10*3/uL (ref 0.0–0.1)
Basophils Relative: 0 %
Eosinophils Absolute: 0 10*3/uL (ref 0.0–0.5)
Eosinophils Relative: 0 %
HCT: 45.5 % (ref 39.0–52.0)
Hemoglobin: 15.5 g/dL (ref 13.0–17.0)
Immature Granulocytes: 1 %
Lymphocytes Relative: 2 %
Lymphs Abs: 0.3 10*3/uL — ABNORMAL LOW (ref 0.7–4.0)
MCH: 35 pg — ABNORMAL HIGH (ref 26.0–34.0)
MCHC: 34.1 g/dL (ref 30.0–36.0)
MCV: 102.7 fL — ABNORMAL HIGH (ref 80.0–100.0)
Monocytes Absolute: 0.2 10*3/uL (ref 0.1–1.0)
Monocytes Relative: 2 %
Neutro Abs: 13.6 10*3/uL — ABNORMAL HIGH (ref 1.7–7.7)
Neutrophils Relative %: 95 %
Platelet Count: 209 10*3/uL (ref 150–400)
RBC: 4.43 MIL/uL (ref 4.22–5.81)
RDW: 14.2 % (ref 11.5–15.5)
WBC Count: 14.3 10*3/uL — ABNORMAL HIGH (ref 4.0–10.5)
nRBC: 0 % (ref 0.0–0.2)

## 2022-09-18 LAB — TOTAL PROTEIN, URINE DIPSTICK: Protein, ur: NEGATIVE mg/dL

## 2022-09-18 NOTE — Progress Notes (Signed)
Jerry Richards [facility] [address]  [place], [place] [address] [phone]   Interval Evaluation  Date of Service: 09/18/22 Patient Name: Jerry Richards Patient MRN: [id number] Patient DOB: 03-21-1970 Provider: Henreitta Leber, MD  Identifying Statement:  Jerry Richards is a 52 y.o. male with right frontal glioblastoma   Oncologic History: Oncology History  High grade glioma not classifiable by WHO criteria (HCC)  05/16/2021 Surgery   Craniotomy, R parafalcine resection with Jerry Richards; path is high grade glioma IDHwt   06/20/2021 - 06/20/2021 Chemotherapy   Patient is on Treatment Plan : BRAIN GLIOBLASTOMA Radiation Therapy With Concurrent Temozolomide 75 mg/m2 Daily Followed By Sequential Maintenance Temozolomide x 6-12 cycles     09/05/2021 - 12/17/2021 Chemotherapy   Completes 3 cycles of adjuvant 5-day Temodar   12/18/2021 Progression   Progression of disease #1   12/29/2021 -  Chemotherapy   Initiates second line therapy with CCNU 90mg /m2 q6 weeks, and Avastin 10mg /kg IV q2 weeks   06/12/2022 - 06/12/2022 Chemotherapy   Patient is on Treatment Plan : BRAIN GBM Bevacizumab 14d x 6 cycles     07/03/2022 - 08/15/2022 Chemotherapy   Patient is on Treatment Plan : BRAIN GBM [facility] Irinotecan D1,15 + Bevacizumab D1,15 q 28d     09/04/2022 -  Chemotherapy   Patient is on Treatment Plan : Brain Bevacizumab D1, D15 + Carboplatin (4) D1 q28d x 6 cycles     Glioblastoma multiforme of frontal lobe (HCC)  09/12/2021 Initial Diagnosis   Glioblastoma multiforme of frontal lobe (HCC)   12/29/2021 - 05/11/2022 Chemotherapy   Patient is on Treatment Plan : BRAIN GBM Bevacizumab q14d       Biomarkers:  MGMT Unknown.  IDH 1/2 Wild type.  EGFR Unknown  TERT Unknown   Interval History: Jerry Richards presents today for initiation of Carboplatin and Avastin and/or goals of care discussion.  No new or progressive changes today, though continues to  have pain in his shoulders, sever at times.  Denies any recurrence of seizures, no headaches.  Leg symptoms are stable.  Still walking on his own.   H+P (06/07/21): Patient presented to neurologic attention in March with several days of progressive left sided weakness and seizures, characterized by twitching of the left leg.  CNS imaging demonstrated a right frontal mass.  He underwent craniotomy and resection with Jerry Richards on 05/16/21; path demonstrated high grade glioma IDHwt.  Following surgery his left leg was much weaker, though it has improved with aggressive rehab efforts.  Currently he is using a cane to ambulate.  He has no other complaints, continues on decadron 2mg  twice per day.  No seizures since surgery.  Medications: Current Outpatient Medications on File Prior to Visit  Medication Sig Dispense Refill   acetaminophen (TYLENOL) 500 MG tablet Take 1,000 mg by mouth every 6 (six) hours as needed for moderate pain.     albuterol (VENTOLIN HFA) 108 (90 Base) MCG/ACT inhaler Inhale 1 puff into the lungs every 4 (four) hours as needed.     levETIRAcetam (KEPPRA) 500 MG tablet TAKE 2 TABS BY MOUTH TWICE DAILY 360 tablet 0   losartan (COZAAR) 50 MG tablet Take 50 mg by mouth daily.  6   mirtazapine (REMERON) 15 MG tablet Take 15 mg by mouth at bedtime.     predniSONE (DELTASONE) 20 MG tablet TAKE 2 TABLETS(40 MG) BY MOUTH DAILY WITH BREAKFAST 60 tablet 1   rivaroxaban (XARELTO) 20  MG TABS tablet Take 1 tablet (20 mg total) by mouth daily. 90 tablet 1   No current facility-administered medications on file prior to visit.    Allergies:  Allergies  Allergen Reactions   Amiodarone     AMIODARONE ANALOGUES Hyperthyroidism   Morphine And Codeine Itching and Palpitations    Chest pressure/palpitations   Past Medical History:  Past Medical History:  Diagnosis Date   Anxiety    Atrial fibrillation (HCC)    a. s/p RFCA x 2 (10/26/2011, 04/15/2012)   Atrial flutter (HCC)    Chronic kidney  disease    Clotting disorder (HCC)    Colitis    Current smoker    Diverticulitis    Finger near amputation, left    Hyperthyroidism    Secondary to amiodarone   LA thrombus    Non-ischemic cardiomyopathy (HCC)    OSA on CPAP    Sinus trouble    Sleep apnea    Systolic heart failure (HCC)    EF 20-25%-> 50-55% in 10/2011. MOST RECENT ECHO (03/2012) EF= 66%   Past Surgical History:  Past Surgical History:  Procedure Laterality Date   APPLICATION OF CRANIAL NAVIGATION Right 05/16/2021   Procedure: APPLICATION OF CRANIAL NAVIGATION;  Surgeon: Jerry Sicks, MD;  Location: [facility] OR;  Service: Neurosurgery;  Laterality: Right;   ATRIAL ABLATION SURGERY     CARDIAC CATHETERIZATION  08/2011   [facility]   CHOLECYSTECTOMY     CRANIOTOMY Right 05/16/2021   Procedure: Craniotomy - right - Parietal with  brainlab;  Surgeon: Jerry Sicks, MD;  Location: [facility] OR;  Service: Neurosurgery;  Laterality: Right;   FINGER SURGERY     LEFT HEART CATHETERIZATION WITH CORONARY ANGIOGRAM N/A 09/12/2011   Procedure: LEFT HEART CATHETERIZATION WITH CORONARY ANGIOGRAM;  Surgeon: Herby Abraham, MD;  Location: [facility] CATH LAB;  Service: Cardiovascular;  Laterality: N/A;   VASECTOMY     Social History:  Social History   Socioeconomic History   Marital status: Married    Spouse name: Jerry Richards   Number of children: 2   Years of education: Not on file   Highest education level: Some college, no degree  Occupational History   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 Occas tea   Social Determinants of Health   Financial Resource Strain: Not on file  Food Insecurity: Not on file  Transportation Needs: Not on file  Physical Activity: Not on file  Stress: Not on file  Social Connections: Not on file  Intimate Partner Violence: Not on file   Family History:  Family History  Problem Relation Age of Onset   Healthy Mother        age 1   Melanoma Mother    Melanoma Father    Atrial fibrillation Father        age  67   Diabetes Maternal Grandmother    Coronary artery disease Paternal Grandmother    Heart disease Paternal Grandmother    Heart attack Paternal Grandmother    Coronary artery disease Paternal Grandfather    Heart disease Paternal Grandfather    Heart attack Paternal Grandfather    Atrial fibrillation Paternal Aunt    Atrial fibrillation Paternal Uncle    Colon cancer Neg Hx    Esophageal cancer Neg Hx    Ovarian cancer Neg Hx    Stomach cancer Neg Hx    Rectal cancer Neg Hx     Review of Systems: Constitutional: Doesn't report fevers, chills or abnormal weight loss Eyes: Doesn't report blurriness of vision Ears, nose, mouth, throat, and face: Doesn't report sore throat Respiratory: Doesn't report cough, dyspnea or wheezes Cardiovascular: Doesn't report palpitation, chest discomfort  Gastrointestinal:  Doesn't report nausea, constipation, diarrhea GU: Doesn't report incontinence Skin: Doesn't report skin rashes Neurological: Per HPI Musculoskeletal: Doesn't report joint pain Behavioral/Psych: Doesn't report anxiety  Physical Exam: Vitals:   09/18/22 1339  BP: (!) 137/90  Pulse: 92  Resp: 16  Temp: 97.8 F (36.6 C)  SpO2: 100%     KPS: 80. General: Alert, cooperative, pleasant, in no acute distress Head: Normal EENT: No conjunctival injection or scleral icterus.  Lungs: Resp effort normal Cardiac: Regular rate Abdomen: Non-distended  abdomen Skin: No rashes cyanosis or petechiae. Extremities: No clubbing or edema  Neurologic Exam: Mental Status: Awake, alert, attentive to examiner. Oriented to self and environment. Language is fluent with intact comprehension.  Cranial Nerves: Visual acuity is grossly normal. Visual fields are full. Extra-ocular movements intact. No ptosis. Face is symmetric Motor: Tone and bulk are normal. Power is 4/5 in left leg, subtle drift in left arm. Reflexes are symmetric, no pathologic reflexes present.  Sensory: Intact to light touch Gait: Hemiparetic, cane assisted.   Labs: I have reviewed the data as listed    Component Value Date/Time   NA 140 08/28/2022 1047   NA 140 03/29/2017 1045   K 3.6 08/28/2022 1047   CL 106 08/28/2022 1047   CO2 27 08/28/2022 1047   GLUCOSE 88 08/28/2022 1047   BUN 18 08/28/2022 1047   BUN 18 03/29/2017 1045   CREATININE 1.21 08/28/2022 1047   CALCIUM 8.8 (L) 08/28/2022 1047   PROT 6.4 (L) 08/28/2022 1047   PROT 7.7 03/29/2017 1045   ALBUMIN 3.9 08/28/2022 1047   ALBUMIN 5.2 03/29/2017 1045   AST 10 (L) 08/28/2022 1047   ALT 18 08/28/2022 1047   ALKPHOS 35 (L) 08/28/2022 1047   BILITOT 0.7 08/28/2022 1047   GFRNONAA >60 08/28/2022 1047   GFRAA 56 (L) 03/29/2017 1045   Lab Results  Component Value Date   WBC 14.3 (H) 09/18/2022   NEUTROABS 13.6 (H) 09/18/2022   HGB 15.5 09/18/2022   HCT 45.5 09/18/2022   MCV 102.7 (H) 09/18/2022   PLT 209 09/18/2022    Focal seizures (HCC)  High grade glioma not classifiable by WHO criteria [facility]) - Plan: Clinic Appointment Request  Taeshawn Helfman presents today with ongoing progression of disease, although symptom burden has remained more less stable over past several weeks.  MRI brain  demonstrates frank progression of bulky enhancing disease surrounding right lateral ventricle and corpus callosum.    Goals of care were discussed at length, he elected today to proceed with home hospice.    We are  agreeable with staying as the primary hospice attending.  Prednisone will  continue 40mg  daily.   Keppra will remain at 1000mg  BID.  We ask that Toney Sang return to clinic only as needed.  All questions were answered. The patient knows to call the clinic with any problems, questions or concerns. No barriers to learning were detected.  The total time spent in the encounter was 40 minutes and more than 50% was on counseling and review of test results   Jerry Leber, MD Medical Director of Neuro-Oncology [facility] at [facility] 09/18/22 1:53 PM

## 2022-10-03 ENCOUNTER — Encounter: Payer: Self-pay | Admitting: Internal Medicine

## 2022-11-07 ENCOUNTER — Encounter: Payer: Self-pay | Admitting: Internal Medicine

## 2022-11-17 ENCOUNTER — Encounter: Payer: Self-pay | Admitting: Internal Medicine

## 2022-11-22 ENCOUNTER — Encounter: Payer: Self-pay | Admitting: Internal Medicine

## 2022-11-27 ENCOUNTER — Encounter: Payer: Self-pay | Admitting: Internal Medicine

## 2022-11-28 DEATH — deceased

## 2023-11-07 ENCOUNTER — Other Ambulatory Visit (HOSPITAL_COMMUNITY): Payer: Self-pay
